# Patient Record
Sex: Male | Born: 1965
Health system: Southern US, Community
[De-identification: ages and names within clinical notes are randomized; demographics above are authoritative.]

## PROBLEM LIST (undated history)

## (undated) ENCOUNTER — Ambulatory Visit: Admission: EM | Source: Home / Self Care

## (undated) DIAGNOSIS — R51 Headache: Secondary | ICD-10-CM

## (undated) DIAGNOSIS — I10 Essential (primary) hypertension: Secondary | ICD-10-CM

## (undated) DIAGNOSIS — K912 Postsurgical malabsorption, not elsewhere classified: Secondary | ICD-10-CM

## (undated) DIAGNOSIS — K219 Gastro-esophageal reflux disease without esophagitis: Secondary | ICD-10-CM

## (undated) DIAGNOSIS — I2510 Atherosclerotic heart disease of native coronary artery without angina pectoris: Secondary | ICD-10-CM

## (undated) DIAGNOSIS — E781 Pure hyperglyceridemia: Secondary | ICD-10-CM

## (undated) DIAGNOSIS — G4733 Obstructive sleep apnea (adult) (pediatric): Secondary | ICD-10-CM

## (undated) DIAGNOSIS — R519 Headache, unspecified: Secondary | ICD-10-CM

## (undated) DIAGNOSIS — N200 Calculus of kidney: Secondary | ICD-10-CM

## (undated) DIAGNOSIS — E668 Other obesity: Secondary | ICD-10-CM

## (undated) DIAGNOSIS — E119 Type 2 diabetes mellitus without complications: Secondary | ICD-10-CM

## (undated) HISTORY — DX: Essential (primary) hypertension: I10

## (undated) HISTORY — PX: TONSILLECTOMY: SUR1361

## (undated) HISTORY — DX: Obstructive sleep apnea (adult) (pediatric): G47.33

## (undated) HISTORY — PX: INNER EAR SURGERY: SHX679

## (undated) HISTORY — DX: Pure hyperglyceridemia: E78.1

## (undated) HISTORY — PX: COLON SURGERY: SHX602

## (undated) HISTORY — DX: Atherosclerotic heart disease of native coronary artery without angina pectoris: I25.10

## (undated) HISTORY — PX: CHOLECYSTECTOMY: SHX55

## (undated) HISTORY — DX: Calculus of kidney: N20.0

## (undated) HISTORY — PX: APPENDECTOMY: SHX54

## (undated) HISTORY — DX: Other obesity: E66.8

---
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
## 1989-01-24 HISTORY — PX: OTHER SURGICAL HISTORY: SHX169

## 1992-01-25 DIAGNOSIS — K90829 Short bowel syndrome, unspecified: Secondary | ICD-10-CM

## 1992-01-25 DIAGNOSIS — K912 Postsurgical malabsorption, not elsewhere classified: Secondary | ICD-10-CM

## 1992-01-25 HISTORY — DX: Postsurgical malabsorption, not elsewhere classified: K91.2

## 1992-01-25 HISTORY — DX: Short bowel syndrome, unspecified: K90.829

## 2008-01-25 HISTORY — PX: HERNIA REPAIR: SHX51

## 2008-01-25 HISTORY — PX: OTHER SURGICAL HISTORY: SHX169

## 2008-02-25 HISTORY — PX: CARDIAC CATHETERIZATION: SHX172

## 2008-02-28 ENCOUNTER — Ambulatory Visit: Payer: Self-pay | Admitting: Cardiovascular Disease

## 2008-02-28 DIAGNOSIS — I2510 Atherosclerotic heart disease of native coronary artery without angina pectoris: Secondary | ICD-10-CM

## 2008-02-28 HISTORY — DX: Atherosclerotic heart disease of native coronary artery without angina pectoris: I25.10

## 2008-03-01 ENCOUNTER — Emergency Department: Payer: Self-pay

## 2008-03-10 ENCOUNTER — Ambulatory Visit: Payer: Self-pay | Admitting: Urology

## 2009-02-27 DIAGNOSIS — I2510 Atherosclerotic heart disease of native coronary artery without angina pectoris: Secondary | ICD-10-CM | POA: Insufficient documentation

## 2009-09-14 DIAGNOSIS — E559 Vitamin D deficiency, unspecified: Secondary | ICD-10-CM | POA: Insufficient documentation

## 2010-04-06 ENCOUNTER — Observation Stay: Payer: Self-pay | Admitting: Internal Medicine

## 2011-05-04 ENCOUNTER — Ambulatory Visit: Payer: Self-pay | Admitting: Family Medicine

## 2011-10-26 LAB — HEMOGLOBIN A1C: Hgb A1c MFr Bld: 5.7 % (ref 4.0–6.0)

## 2013-01-29 ENCOUNTER — Emergency Department: Payer: Self-pay | Admitting: Emergency Medicine

## 2013-01-29 LAB — BASIC METABOLIC PANEL
Anion Gap: 4 — ABNORMAL LOW (ref 7–16)
BUN: 25 mg/dL — ABNORMAL HIGH (ref 7–18)
CALCIUM: 9.2 mg/dL (ref 8.5–10.1)
CO2: 28 mmol/L (ref 21–32)
Chloride: 103 mmol/L (ref 98–107)
Creatinine: 1.01 mg/dL (ref 0.60–1.30)
EGFR (African American): >60
GLUCOSE: 135 mg/dL — AB (ref 65–99)
OSMOLALITY: 277 (ref 275–301)
POTASSIUM: 4.1 mmol/L (ref 3.5–5.1)
Sodium: 135 mmol/L — ABNORMAL LOW (ref 136–145)

## 2013-01-29 LAB — TROPONIN I

## 2013-01-29 LAB — CBC
HCT: 48.2 % (ref 40.0–52.0)
HGB: 16.8 g/dL (ref 13.0–18.0)
MCH: 29.9 pg (ref 26.0–34.0)
MCHC: 35 g/dL (ref 32.0–36.0)
MCV: 85 fL (ref 80–100)
PLATELETS: 234 10*3/uL (ref 150–440)
RBC: 5.64 10*6/uL (ref 4.40–5.90)
RDW: 12.7 % (ref 11.5–14.5)
WBC: 11.3 10*3/uL — ABNORMAL HIGH (ref 3.8–10.6)

## 2013-02-11 LAB — TSH: TSH: 1.26 u[IU]/mL (ref <=5.90)

## 2013-03-25 LAB — PSA: PSA: 0.2

## 2013-10-07 LAB — HEPATIC FUNCTION PANEL
ALK PHOS: 97 U/L (ref 25–125)
ALT: 27 U/L (ref 10–40)
AST: 27 U/L (ref 14–40)
BILIRUBIN, TOTAL: 0.5 mg/dL

## 2013-10-07 LAB — BASIC METABOLIC PANEL
BUN: 14 mg/dL (ref 4–21)
Creatinine: 0.8 mg/dL (ref <=1.3)
Glucose: 80 mg/dL
POTASSIUM: 3.9 mmol/L (ref 3.4–5.3)
SODIUM: 146 mmol/L (ref 137–147)

## 2013-10-07 LAB — CBC AND DIFFERENTIAL
HCT: 47 % (ref 41–53)
HEMOGLOBIN: 17.1 g/dL (ref 13.5–17.5)
Neutrophils Absolute: 60 /uL
Platelets: 190 10*3/uL (ref 150–399)
WBC: 7.5 10^3/mL

## 2013-12-09 LAB — LIPID PANEL
Cholesterol: 216 mg/dL — AB (ref 0–200)
HDL: 22 mg/dL — AB (ref 35–70)
TRIGLYCERIDES: 691 mg/dL — AB (ref 40–160)

## 2014-05-29 DIAGNOSIS — K279 Peptic ulcer, site unspecified, unspecified as acute or chronic, without hemorrhage or perforation: Secondary | ICD-10-CM | POA: Insufficient documentation

## 2014-05-29 DIAGNOSIS — I152 Hypertension secondary to endocrine disorders: Secondary | ICD-10-CM | POA: Insufficient documentation

## 2014-05-29 DIAGNOSIS — N529 Male erectile dysfunction, unspecified: Secondary | ICD-10-CM | POA: Insufficient documentation

## 2014-05-29 DIAGNOSIS — F329 Major depressive disorder, single episode, unspecified: Secondary | ICD-10-CM | POA: Insufficient documentation

## 2014-05-29 DIAGNOSIS — E668 Other obesity: Secondary | ICD-10-CM

## 2014-05-29 DIAGNOSIS — K219 Gastro-esophageal reflux disease without esophagitis: Secondary | ICD-10-CM | POA: Insufficient documentation

## 2014-05-29 DIAGNOSIS — F419 Anxiety disorder, unspecified: Secondary | ICD-10-CM | POA: Insufficient documentation

## 2014-05-29 DIAGNOSIS — R7989 Other specified abnormal findings of blood chemistry: Secondary | ICD-10-CM | POA: Insufficient documentation

## 2014-05-29 DIAGNOSIS — E782 Mixed hyperlipidemia: Secondary | ICD-10-CM | POA: Insufficient documentation

## 2014-05-29 DIAGNOSIS — IMO0002 Reserved for concepts with insufficient information to code with codable children: Secondary | ICD-10-CM | POA: Insufficient documentation

## 2014-05-29 DIAGNOSIS — K429 Umbilical hernia without obstruction or gangrene: Secondary | ICD-10-CM | POA: Insufficient documentation

## 2014-05-29 DIAGNOSIS — E669 Obesity, unspecified: Secondary | ICD-10-CM

## 2014-05-29 DIAGNOSIS — R4589 Other symptoms and signs involving emotional state: Secondary | ICD-10-CM | POA: Insufficient documentation

## 2014-05-29 DIAGNOSIS — R945 Abnormal results of liver function studies: Secondary | ICD-10-CM | POA: Insufficient documentation

## 2014-05-29 DIAGNOSIS — R739 Hyperglycemia, unspecified: Secondary | ICD-10-CM | POA: Insufficient documentation

## 2014-05-29 DIAGNOSIS — G4730 Sleep apnea, unspecified: Secondary | ICD-10-CM | POA: Insufficient documentation

## 2014-05-29 DIAGNOSIS — F32A Depression, unspecified: Secondary | ICD-10-CM | POA: Insufficient documentation

## 2014-05-29 DIAGNOSIS — I10 Essential (primary) hypertension: Secondary | ICD-10-CM | POA: Insufficient documentation

## 2014-05-29 DIAGNOSIS — G43909 Migraine, unspecified, not intractable, without status migrainosus: Secondary | ICD-10-CM | POA: Insufficient documentation

## 2014-05-29 HISTORY — DX: Obesity, unspecified: E66.9

## 2014-05-29 HISTORY — DX: Other obesity: E66.8

## 2014-07-14 ENCOUNTER — Encounter: Payer: Self-pay | Admitting: Family Medicine

## 2014-07-14 ENCOUNTER — Ambulatory Visit (INDEPENDENT_AMBULATORY_CARE_PROVIDER_SITE_OTHER): Payer: 59 | Admitting: Family Medicine

## 2014-07-14 VITALS — BP 128/76 | HR 84 | Temp 98.2°F | Resp 14 | Ht 71.0 in | Wt 264.0 lb

## 2014-07-14 DIAGNOSIS — Z0000 Encounter for general adult medical examination without abnormal findings: Secondary | ICD-10-CM

## 2014-07-14 DIAGNOSIS — Z125 Encounter for screening for malignant neoplasm of prostate: Secondary | ICD-10-CM

## 2014-07-14 DIAGNOSIS — G44209 Tension-type headache, unspecified, not intractable: Secondary | ICD-10-CM | POA: Diagnosis not present

## 2014-07-14 LAB — POCT URINALYSIS DIPSTICK
Bilirubin, UA: NEGATIVE
Glucose, UA: NEGATIVE
KETONES UA: NEGATIVE
Leukocytes, UA: NEGATIVE
Nitrite, UA: NEGATIVE
Spec Grav, UA: 1.025
Urobilinogen, UA: 0.2
pH, UA: 6

## 2014-07-14 LAB — HEMOCCULT GUIAC POC 1CARD (OFFICE): Fecal Occult Blood, POC: NEGATIVE

## 2014-07-14 NOTE — Progress Notes (Signed)
Patient ID: [name], male   DOB: [birth year], 49 y.o.   MRN: [id number] Patient: [facility], Male    DOB: 06-06-1965, 49 y.o.   MRN: [id number] Visit Date: 07/14/2014  Today's Provider: [name], MD   Chief Complaint  Patient presents with  . Annual Exam   Subjective:  [facility] is a 49 y.o. male who presents today for health maintenance and complete physical. He feels fairly well. He reports exercising none. He reports he is sleeping fairly well. The patient's overall attitude is much better and is spared is much improved over previous months and years. This is despite the fact that his grandmother and mother are both having severe health challenges. He is now separated from his wife for 2 years and divorced is pending. He is coping with this much better. He really has no significant complaints today. He states he thinks he has passed 25 kidney stones since about 2009 when he had his first.  Review of Systems  Constitutional: Negative.   HENT: Positive for dental problem, drooling and ear pain.   Eyes: Positive for itching.  Respiratory: Negative.   Cardiovascular: Negative.   Gastrointestinal: Negative.   Endocrine: Negative.   Genitourinary: Positive for hematuria (passed 3 kidney stones this past weekened) and difficulty urinating.  Musculoskeletal: Positive for back pain and neck pain.  Skin: Negative.   Allergic/Immunologic: Negative.   Neurological: Positive for dizziness and headaches.  Hematological: Negative.   Psychiatric/Behavioral: Negative.     History   Social History  . Marital Status: Married    Spouse Name: Collie Siad  . Number of Children: 0  . Years of Education: 14   Occupational History  . [employer]    Social History Main Topics  . Smoking status: Former Smoker -- 1.00 packs/day for 28 years    Types: Cigarettes    Quit date: 12/24/1996  . Smokeless tobacco: Never Used  . Alcohol Use: Yes     Comment: seldom  . Drug Use: No  .  Sexual Activity: Not Currently   Other Topics Concern  . Not on file   Social History Narrative    Patient Active Problem List   Diagnosis Date Noted  . Signs and symptoms involving emotional state 05/29/2014  . Anxiety 05/29/2014  . Adult BMI 30+ 05/29/2014  . Clinical depression 05/29/2014  . Failure of erection 05/29/2014  . Abnormal LFTs 05/29/2014  . Blood glucose elevated 05/29/2014  . GERD (gastroesophageal reflux disease) 05/29/2014  . Combined fat and carbohydrate induced hyperlipemia 05/29/2014  . Hypertension 05/29/2014  . Headache, migraine 05/29/2014  . Extreme obesity 05/29/2014  . Gastroduodenal ulcer 05/29/2014  . Apnea, sleep 05/29/2014  . Umbilical hernia without obstruction and without gangrene 05/29/2014  . Avitaminosis D 09/14/2009  . Arteriosclerosis of coronary artery 02/27/2009    Past Surgical History  Procedure Laterality Date  . Hernia repair    . Cholecystectomy    . Appendectomy    . Tonsillectomy    . Inner ear surgery      His family history includes Hypertension in his mother; Irritable bowel syndrome in his mother; Lumbar disc disease in his mother; Migraines in his mother.    Outpatient Prescriptions Prior to Visit  Medication Sig Dispense Refill  . losartan-hydrochlorothiazide (HYZAAR) 100-25 MG per tablet Take by mouth.    . meloxicam (MOBIC) 15 MG tablet Take by mouth.    . metoprolol succinate (TOPROL-XL) 100 MG 24 hr tablet Take by mouth.    Marland Kitchen  omeprazole (PRILOSEC) 20 MG capsule Take by mouth.    . tadalafil (CIALIS) 20 MG tablet Take by mouth.    Marland Kitchen buPROPion (WELLBUTRIN XL) 150 MG 24 hr tablet Take by mouth.    . hydrochlorothiazide (HYDRODIURIL) 25 MG tablet Take by mouth.    . losartan (COZAAR) 100 MG tablet Take by mouth.     No facility-administered medications prior to visit.    Patient Care Team: [name]., MD as PCP - General (Family Medicine)     Objective:   Vitals:  Filed Vitals:   07/14/14  1414  BP: 128/76  Pulse: 84  Temp: 98.2 F (36.8 C)  Resp: 14  Height: 5\' 11"  (1.803 m)  Weight: 264 lb (119.75 kg)    Physical Exam  Constitutional: He is oriented to person, place, and time. He appears well-developed and well-nourished. No distress.  HENT:  Head: Normocephalic and atraumatic.  Right Ear: External ear normal.  Left Ear: External ear normal.  Nose: Nose normal.  Mouth/Throat: Oropharynx is clear and moist.  Eyes: Conjunctivae and EOM are normal. Pupils are equal, round, and reactive to light.  Neck: Normal range of motion. Neck supple.  Cardiovascular: Normal rate, regular rhythm, normal heart sounds and intact distal pulses.   No murmur heard. Pulmonary/Chest: Effort normal and breath sounds normal. He has no wheezes. He has no rales. He exhibits no tenderness.  Abdominal: Soft. Bowel sounds are normal. He exhibits no distension. There is no tenderness.  Genitourinary: Rectum normal, prostate normal and penis normal. Guaiac negative stool. No penile tenderness.  Musculoskeletal: Normal range of motion. He exhibits no edema or tenderness.  Neurological: He is alert and oriented to person, place, and time.  Skin: Skin is warm and dry. No erythema.  Psychiatric: He has a normal mood and affect. His behavior is normal. Judgment and thought content normal.     Depression Screen PHQ 2/9 Scores 07/14/2014  PHQ - 2 Score 0      Assessment & Plan:  1. Annual physical exam Check labs today. - CBC with Differential/Platelet - COMPLETE METABOLIC PANEL WITH GFR - Lipid Panel With LDL/HDL Ratio - POCT Urinalysis Dipstick - TSH  2. Prostate cancer screening  - POCT Occult Blood Stool - PSA  3. Anxiety Patient has stopped Bupropion. Doing well off the medication. Follow at this time.   4. Recent reccurrence of kidney stones   I have done the exam and reviewed the above chart and it is accurate to the best of my knowledge.

## 2014-07-15 LAB — CBC WITH DIFFERENTIAL/PLATELET
Basophils Absolute: 0 10*3/uL (ref 0.0–0.2)
Basos: 1 %
EOS (ABSOLUTE): 0.1 10*3/uL (ref 0.0–0.4)
Eos: 1 %
HEMATOCRIT: 45 % (ref 37.5–51.0)
Hemoglobin: 16.5 g/dL (ref 12.6–17.7)
Immature Grans (Abs): 0 10*3/uL (ref 0.0–0.1)
Immature Granulocytes: 0 %
LYMPHS ABS: 2.6 10*3/uL (ref 0.7–3.1)
Lymphs: 29 %
MCH: 30.8 pg (ref 26.6–33.0)
MCHC: 36.7 g/dL — AB (ref 31.5–35.7)
MCV: 84 fL (ref 79–97)
Monocytes Absolute: 0.6 10*3/uL (ref 0.1–0.9)
Monocytes: 7 %
Neutrophils Absolute: 5.5 10*3/uL (ref 1.4–7.0)
Neutrophils: 62 %
PLATELETS: 201 10*3/uL (ref 150–379)
RBC: 5.36 x10E6/uL (ref 4.14–5.80)
RDW: 13.5 % (ref 12.3–15.4)
WBC: 8.8 10*3/uL (ref 3.4–10.8)

## 2014-07-15 LAB — CMP14+EGFR
ALBUMIN: 4.4 g/dL (ref 3.5–5.5)
ALK PHOS: 80 IU/L (ref 39–117)
ALT: 68 IU/L — ABNORMAL HIGH (ref 0–44)
AST: 66 IU/L — ABNORMAL HIGH (ref 0–40)
Albumin/Globulin Ratio: 1.4 (ref 1.1–2.5)
BILIRUBIN TOTAL: 0.9 mg/dL (ref 0.0–1.2)
BUN/Creatinine Ratio: 15 (ref 9–20)
BUN: 15 mg/dL (ref 6–24)
CO2: 25 mmol/L (ref 18–29)
CREATININE: 0.98 mg/dL (ref 0.76–1.27)
Calcium: 9.2 mg/dL (ref 8.7–10.2)
Chloride: 98 mmol/L (ref 97–108)
GFR, EST AFRICAN AMERICAN: 105 mL/min/{1.73_m2} (ref >59)
GFR, EST NON AFRICAN AMERICAN: 91 mL/min/{1.73_m2} (ref >59)
GLOBULIN, TOTAL: 3.1 g/dL (ref 1.5–4.5)
GLUCOSE: 104 mg/dL — AB (ref 65–99)
POTASSIUM: 3.9 mmol/L (ref 3.5–5.2)
Sodium: 140 mmol/L (ref 134–144)
Total Protein: 7.5 g/dL (ref 6.0–8.5)

## 2014-07-15 LAB — LIPID PANEL WITH LDL/HDL RATIO
Cholesterol, Total: 186 mg/dL (ref 100–199)
HDL: 23 mg/dL — AB (ref >39)
TRIGLYCERIDES: 517 mg/dL — AB (ref 0–149)

## 2014-07-15 LAB — PSA: Prostate Specific Ag, Serum: 0.3 ng/mL (ref 0.0–4.0)

## 2014-07-15 LAB — TSH: TSH: 1.97 u[IU]/mL (ref 0.450–4.500)

## 2014-07-16 ENCOUNTER — Telehealth: Payer: Self-pay

## 2014-07-16 NOTE — Telephone Encounter (Signed)
Advised  ED 

## 2014-07-16 NOTE — Telephone Encounter (Signed)
-----   Message from [name]., MD sent at 07/16/2014  8:40 AM EDT ----- Labs stable. Lipids still high. Continue diet and exercise for this. Also prediabetic mildly. Also diet and exercise as treatment for this.

## 2015-01-01 ENCOUNTER — Other Ambulatory Visit: Payer: Self-pay | Admitting: Family Medicine

## 2015-01-02 ENCOUNTER — Other Ambulatory Visit: Payer: Self-pay | Admitting: Family Medicine

## 2015-01-07 ENCOUNTER — Ambulatory Visit: Payer: Self-pay | Admitting: Cardiology

## 2015-01-13 ENCOUNTER — Ambulatory Visit (INDEPENDENT_AMBULATORY_CARE_PROVIDER_SITE_OTHER): Payer: Managed Care, Other (non HMO) | Admitting: Family Medicine

## 2015-01-13 VITALS — BP 142/84 | HR 68 | Temp 98.3°F | Resp 16 | Wt 257.0 lb

## 2015-01-13 DIAGNOSIS — R5383 Other fatigue: Secondary | ICD-10-CM | POA: Diagnosis not present

## 2015-01-13 DIAGNOSIS — R739 Hyperglycemia, unspecified: Secondary | ICD-10-CM

## 2015-01-13 DIAGNOSIS — N529 Male erectile dysfunction, unspecified: Secondary | ICD-10-CM

## 2015-01-13 MED ORDER — TADALAFIL 20 MG PO TABS: 10.0000 mg | ORAL_TABLET | Freq: Every day | ORAL | Status: DC | PRN

## 2015-01-13 NOTE — Progress Notes (Signed)
Patient ID: [facility], male   DOB: Aug 04, 1965, 49 y.o.   MRN: [id number]   Jobie Popp  MRN: [id number] DOB: 18-Mar-1965  Subjective:  HPI   1. Hyperglycemia The patient is a 49 year old male who presents for follow up of his elevated glucose.  He was last seen on 07/14/14.  No management changes were made at that time.  It has been quite a long time since he had his A1C checked and is here today to do so.  2. Other fatigue Patient also complains of fatigue that is getting worse.  He states he feels tired and drained all the time.  He states he feels tired even after just getting up.  He does have sleep apnea and states he uses his CPAP faithfully.  The patient would like to have his Testosterone level checked for this and because he states he has met someone new and would like to talk about that.   Patient Active Problem List   Diagnosis Date Noted  . Signs and symptoms involving emotional state 05/29/2014  . Anxiety 05/29/2014  . Adult BMI 30+ 05/29/2014  . Clinical depression 05/29/2014  . Failure of erection 05/29/2014  . Abnormal LFTs 05/29/2014  . Blood glucose elevated 05/29/2014  . GERD (gastroesophageal reflux disease) 05/29/2014  . Combined fat and carbohydrate induced hyperlipemia 05/29/2014  . Hypertension 05/29/2014  . Headache, migraine 05/29/2014  . Extreme obesity ([place]) 05/29/2014  . Gastroduodenal ulcer 05/29/2014  . Apnea, sleep 05/29/2014  . Umbilical hernia without obstruction and without gangrene 05/29/2014  . Avitaminosis D 09/14/2009  . Arteriosclerosis of coronary artery 02/27/2009    No past medical history on file.  Social History   Social History  . Marital Status: Divorced    Spouse Name: Collie Siad  . Number of Children: 0  . Years of Education: 14   Occupational History  . [employer]    Social History Main Topics  . Smoking status: Former Smoker -- 1.00 packs/day for 28 years    Types: Cigarettes    Quit date: 12/24/1996  . Smokeless  tobacco: Never Used  . Alcohol Use: Yes     Comment: seldom  . Drug Use: No  . Sexual Activity: Not Currently   Other Topics Concern  . Not on file   Social History Narrative    Outpatient Prescriptions Prior to Visit  Medication Sig Dispense Refill  . butalbital-acetaminophen-caffeine (FIORICET) 50-325-40 MG per tablet Take 1 tablet by mouth every 6 (six) hours as needed for headache.    . losartan-hydrochlorothiazide (HYZAAR) 100-25 MG tablet TAKE 1 TABLET BY MOUTH DAILY 30 tablet 10  . meloxicam (MOBIC) 15 MG tablet Take by mouth.    . metoprolol succinate (TOPROL-XL) 100 MG 24 hr tablet TAKE 1 TABLET BY MOUTH EVERY DAY 30 tablet 5  . omeprazole (PRILOSEC) 20 MG capsule Take by mouth.    . tadalafil (CIALIS) 20 MG tablet Take by mouth.     No facility-administered medications prior to visit.    Allergies  Allergen Reactions  . Guaifenesin Er     Review of Systems  Constitutional: Positive for chills and malaise/fatigue. Negative for fever and diaphoresis.       Decreased appetite  Eyes: Negative.   Respiratory: Negative for cough, hemoptysis, sputum production, shortness of breath and wheezing.   Cardiovascular: Negative for chest pain, palpitations, orthopnea and leg swelling.  Gastrointestinal: Negative.   Genitourinary: Negative.        Decreased libido  Neurological:  Negative.  Negative for weakness.  Endo/Heme/Allergies: Negative.   Psychiatric/Behavioral: Negative.        Positive for irritability.   Objective:  BP 142/84 mmHg  Pulse 68  Temp(Src) 98.3 F (36.8 C) (Oral)  Resp 16  Wt 257 lb (116.574 kg)  Physical Exam  Constitutional: He is oriented to person, place, and time and well-developed, well-nourished, and in no distress.  HENT:  Head: Normocephalic and atraumatic.  Eyes: Conjunctivae are normal.  Neck: Neck supple.  Cardiovascular: Normal rate, regular rhythm and normal heart sounds.   Pulmonary/Chest: Effort normal and breath sounds  normal.  Abdominal: Soft.  Neurological: He is alert and oriented to person, place, and time. Gait normal.  Skin: Skin is warm and dry.  Psychiatric: Mood, memory, affect and judgment normal.    Assessment and Plan :  Hyperglycemia  Other fatigue Patient wonders if he could have  Hypogonadism.Check testosterone. HTN GERD Mild Obesity HLD I have done the exam and reviewed the above chart and it is accurate to the best of my knowledge.  Miguel Aschoff MD [facility] 01/13/2015 4:07 PM

## 2015-01-15 LAB — TESTOSTERONE: Testosterone: 427 ng/dL (ref 348–1197)

## 2015-01-15 LAB — HEMOGLOBIN A1C
Est. average glucose Bld gHb Est-mCnc: 140 mg/dL
Hgb A1c MFr Bld: 6.5 % — ABNORMAL HIGH (ref 4.8–5.6)

## 2015-01-29 ENCOUNTER — Ambulatory Visit: Payer: Self-pay | Admitting: Cardiology

## 2015-02-25 ENCOUNTER — Encounter (INDEPENDENT_AMBULATORY_CARE_PROVIDER_SITE_OTHER): Payer: Self-pay

## 2015-02-25 ENCOUNTER — Ambulatory Visit (INDEPENDENT_AMBULATORY_CARE_PROVIDER_SITE_OTHER): Payer: Managed Care, Other (non HMO) | Admitting: Cardiology

## 2015-02-25 ENCOUNTER — Encounter: Payer: Self-pay | Admitting: Cardiology

## 2015-02-25 VITALS — BP 116/80 | HR 83 | Ht 69.5 in | Wt 258.5 lb

## 2015-02-25 DIAGNOSIS — E786 Lipoprotein deficiency: Secondary | ICD-10-CM

## 2015-02-25 DIAGNOSIS — E669 Obesity, unspecified: Secondary | ICD-10-CM

## 2015-02-25 DIAGNOSIS — E8881 Metabolic syndrome: Secondary | ICD-10-CM

## 2015-02-25 DIAGNOSIS — I491 Atrial premature depolarization: Secondary | ICD-10-CM

## 2015-02-25 DIAGNOSIS — R000 Tachycardia, unspecified: Secondary | ICD-10-CM

## 2015-02-25 DIAGNOSIS — E781 Pure hyperglyceridemia: Secondary | ICD-10-CM

## 2015-02-25 DIAGNOSIS — Z8249 Family history of ischemic heart disease and other diseases of the circulatory system: Secondary | ICD-10-CM

## 2015-02-25 DIAGNOSIS — I2510 Atherosclerotic heart disease of native coronary artery without angina pectoris: Secondary | ICD-10-CM

## 2015-02-25 MED ORDER — FENOFIBRATE 150 MG PO CAPS: 150.0000 mg | ORAL_CAPSULE | Freq: Every day | ORAL | Status: DC

## 2015-02-25 NOTE — Progress Notes (Signed)
PATIENT: [facility] MRN: [id number] DOB: 1966-01-07 PCP: Robert Durie, MD  Clinic Note: Chief Complaint  Patient presents with  . Other    No complaints Hx of MI family c/o rapid heart beat at bedtime. Meds reviewed verbally with pt.  . Palpitations    HPI: [facility] is a 50 y.o. male with a PMH below who presents today for reestablishing cardiology care. He is self-referred, but requested through his PCP to establish cardiology care. His mother is [facility]. She is a patient of mine with coronary disease. He was a former patient of Dr. Donne [facility] @ [facility] dating back to 2010  Interval History: [facility] presents today really for establishing cardiology care, and does not have any significant presenting symptom. He will not occasional palpitations when he lies down to sleep to go to sleep, but denies any significant chest discomfort associated with it. He has some epigastric distention depending when he eats, but that actually improves with exercise. He has gotten little bit lax with his exercise, and has gained weight. He has not been doing nearly the amount of exercise that he used to and has been not doing his regular job watching his food intake either. He has put on quite a bit overweight in the last year or so. Despite this he denies any admitting cardiovascular symptoms besides palpitations.  Cardiovascular ROS: positive for - irregular heartbeat, palpitations and this sometimes cause discomfort negative for - edema, loss of consciousness, murmur, orthopnea, palpitations, paroxysmal nocturnal dyspnea, rapid heart rate or shortness of breath, syncope/near-syncope, IA/amaurosis fugax - no   Past Medical History  Diagnosis Date  . OSA treated with BiPAP   . Moderate obesity 05/29/2014  . Coronary artery disease, non-occlusive 02/28/2008    [facility] @ [facility]: 40-50% LCx lesion by cath,  EF 60% with normal wall motion.  . High triglycerides      was previously on  treatment, but  he notes that this was stopped  . Essential hypertension   . Kidney stones     Prior Cardiac Evaluation and Past Surgical History: Past Surgical History  Procedure Laterality Date  . Hernia repair    . Cholecystectomy    . Appendectomy    . Tonsillectomy    . Inner ear surgery    . Cardionet holter monitor   January 2010     sinus rhythm. Rare PVCs in setting was. Current PACs , seen in singles and pairs. Max heart rate 127, minimum heart rate 57. Average 84.  [facility] nuclear stress test   January 2010     heart rate increased from 80-158 BPM. No EKG changes.  there is a moderate sized  reversible defect in the inferior and inferior lateral wall as well as anteroseptal. EF 59%.  . Cardiac catheterization  02/2008    [facility] @ [facility]: 40-50% LCx lesion by cath,  EF 60% with normal wall motion.    Allergies  Allergen Reactions  . Guaifenesin Er     Current Outpatient Prescriptions  Medication Sig Dispense Refill  . butalbital-acetaminophen-caffeine (FIORICET) 50-325-40 MG per tablet Take 1 tablet by mouth every 6 (six) hours as needed for headache.    . losartan-hydrochlorothiazide (HYZAAR) 100-25 MG tablet TAKE 1 TABLET BY MOUTH DAILY 30 tablet 10  . meloxicam (MOBIC) 15 MG tablet Take 15 mg by mouth as needed.     . metoprolol succinate (TOPROL-XL) 100 MG 24 hr tablet TAKE 1 TABLET BY MOUTH EVERY DAY  30 tablet 5  . tadalafil (CIALIS) 20 MG tablet Take 0.5 tablets (10 mg total) by mouth daily as needed for erectile dysfunction. 10 tablet 12  .       No current facility-administered medications for this visit.    Social History   Social History Narrative    He is essentially the primary caregiver for his mom -  Who had an MI following back surgery  In the   Spring of 2016.    family history includes Heart attack in his maternal grandfather, maternal uncle, and mother; Hypertension in his mother; Irritable bowel syndrome in his mother; Lumbar disc disease  in his mother; Migraines in his mother.  ROS: A comprehensive Review of Systems - was performed Review of Systems  Constitutional: Negative for fever, chills, weight loss, malaise/fatigue and diaphoresis.  HENT: Positive for hearing loss and nosebleeds.   Eyes: Negative for blurred vision and double vision.  Respiratory: Negative for cough, sputum production and shortness of breath.   Cardiovascular: Positive for leg swelling (occasional mild).       Per history of present illness  Gastrointestinal: Positive for heartburn, abdominal pain (He tends to get gas buildup with a pressure sensation in the epigastric area. -- This actually gets better with cardiovascular exercise.) and constipation (Occasionally, depending on what he eats.). Negative for blood in stool and melena.  Genitourinary: Negative for hematuria.  Neurological: Negative for headaches.  Endo/Heme/Allergies: Does not bruise/bleed easily.  Psychiatric/Behavioral: Negative for depression. The patient is not nervous/anxious and does not have insomnia.   All other systems reviewed and are negative.   PHYSICAL EXAM BP 116/80 mmHg  Pulse 83  Ht 5' 9.5" (1.765 m)  Wt 258 lb 8 oz (117.255 kg)  BMI 37.64 kg/m2 General appearance: alert, cooperative, appears stated age, no distress, moderately obese and Normal mood and affect. Well groomed. Neck: no adenopathy, no carotid bruit, no JVD and supple, symmetrical, trachea midline Lungs: clear to auscultation bilaterally, normal percussion bilaterally and Nonlabored, good air movement Heart: regular rate and rhythm, S1, S2 normal, no murmur, click, rub or gallop, normal apical impulse and Occasional ectopy Abdomen: soft, non-tender; bowel sounds normal; no masses,  no organomegaly Extremities: extremities normal, atraumatic, no cyanosis or edema and Trace bilateral lower extremity edema. Pulses: 2+ and symmetric Skin: Skin color, texture, turgor normal. No rashes or lesions Neurologic:  Alert and oriented X 3, normal strength and tone. Normal symmetric reflexes. Normal coordination and gait Mental status: Alert, oriented, thought content appropriate Cranial nerves: normal   Adult ECG Report  Rate: 83 ;  Rhythm: normal sinus rhythm  QRS Axis: 30 ;  PR Interval: 120 ;  QRS Duration: 102 ; QTc: 462; Voltages: Normal == all normal  Conduction Disturbances: none  Other Abnormalities: none  Narrative Interpretation: Normal EKG  EKG provided by PCP from 02/14/2008 showed sinus rhythm with PACs. Rate 84 bpm.  Recent Labs: He is due for repeat labs soon. Lab Results  Component Value Date   CHOL 186 07/14/2014   HDL 23* 07/14/2014   LDLCALC Comment 07/14/2014   TRIG 517* 07/14/2014   Lab Results  Component Value Date   HGBA1C 6.5* 01/14/2015    ASSESSMENT / PLAN:  Jud is a relatively stable otherwise healthy 50 year old gentleman his main risk factor is family history, obesity, hypertriglyceridemia and relatively sedentary lifestyle at this current juncture. He has known nonocclusive coronary disease from cardiac catheter ablation in 2010. Our goal should be to continue  Preventative  medicine.  Problem List Items Addressed This Visit    Rapid heart beat - Primary     The only time he notes having the "rapid heartbeat spells or when he lies down at night. Doesn't last very long, and it could very well be a short run of PAT or PSVT.  he is currently on a good dose of beta blocker. I discussed maybe taking his beta blocker shortly before bedtime to help.      Relevant Orders   EKG 12-Lead (Completed)   Obesity, Class II, BMI 35-39.9 (Chronic)    The patient understands the need to lose weight with diet and exercise. We have discussed specific strategies for this.  we talked about potential dietary changes especially with hypertriglyceridemia. He needs to cut down his  Starchy foods. Increase exercise - walking, "aerobic" --> is slowly trying to start.         Metabolic syndrome (Chronic)     Based on BMI, low HDL, high triglycerides and hypertension.  I explained to him this is basically risk equivalent for diabetes and coronary disease. Each factor needs to be controlled.      Hypertriglyceridemia without hypercholesterolemia (Chronic)   Relevant Medications   Fenofibrate 150 MG CAPS   Atrial premature contractions (Chronic)     Long-standing, diagnosed by event monitor in 2010. Relatively a symptomatically. Seems to be better controlled on high-dose Toprol.  he does not seem to have signs and symptoms of SVT or PAT.      Relevant Medications   Fenofibrate 150 MG CAPS   Arteriosclerosis of coronary artery (Chronic)     He does have mild to moderate nonobstructive coronary disease in the  Circumflex by catheterization done in 2010. He has not had any active anginal symptoms.    He basically has all the components of metabolic syndrome:  I did stress of the importance of risk factor modification -  Weight loss with diet As, glycemic control, lipid control with improving HDL and lowering triglycerides , blood pressure control.   he is on a beta blocker  And ARB. Not currently taking aspirin because he bleeds easily at work. Not currently giving him a statin because his hypertriglyceridemia first.       Relevant Medications   Fenofibrate 150 MG CAPS   Abnormally low high density lipoprotein (HDL) cholesterol with hypertriglyceridemia (Chronic)     Low HDL, the only way to get this up is to exercise. Would consider Lovaza in the omega-3 fatty acid. Plan unfortunately with triglycerides as high as they are, we need to start treating them first, so we'll start with fenofibrate.  Once the triglycerides have come down appreciably, we can then consider adding a statin.      Relevant Medications   Fenofibrate 150 MG CAPS    Other Visit Diagnoses    Family history of heart attack        Relevant Orders    EKG 12-Lead (Completed)    Lipid  Profile       Meds ordered this encounter  Medications  . Fenofibrate 150 MG CAPS    Sig: Take 1 capsule (150 mg total) by mouth daily after supper.    Dispense:  30 each    Refill:  6    Followup:  6 months    Tena Linebaugh W, M.D., M.S.  [employer] [employer]  [address] [place], [place] [address] [phone] Fax [phone]

## 2015-02-25 NOTE — Patient Instructions (Signed)
Medication Instructions:  Your physician has recommended you make the following change in your medication:  START taking fenofibrate 150mg  daily at dinner.   Labwork: Fasting lipids in 3 months. Nothing to eat or drink after midnight the evening before your labs.   Testing/Procedures: none  Follow-Up: Your physician wants you to follow-up in: six months with Dr. Ellyn Hack. You will receive a reminder letter in the mail two months in advance. If you don't receive a letter, please call our office to schedule the follow-up appointment.   Any Other Special Instructions Will Be Listed Below (If Applicable).     If you need a refill on your cardiac medications before your next appointment, please call your pharmacy.

## 2015-02-26 ENCOUNTER — Encounter: Payer: Self-pay | Admitting: Cardiology

## 2015-02-26 DIAGNOSIS — E786 Lipoprotein deficiency: Secondary | ICD-10-CM

## 2015-02-26 DIAGNOSIS — E782 Mixed hyperlipidemia: Secondary | ICD-10-CM | POA: Insufficient documentation

## 2015-02-26 DIAGNOSIS — R000 Tachycardia, unspecified: Secondary | ICD-10-CM | POA: Insufficient documentation

## 2015-02-26 DIAGNOSIS — I491 Atrial premature depolarization: Secondary | ICD-10-CM | POA: Insufficient documentation

## 2015-02-26 DIAGNOSIS — E781 Pure hyperglyceridemia: Secondary | ICD-10-CM | POA: Insufficient documentation

## 2015-02-26 DIAGNOSIS — E8881 Metabolic syndrome: Secondary | ICD-10-CM | POA: Insufficient documentation

## 2015-02-26 NOTE — Assessment & Plan Note (Signed)
Based on BMI, low HDL, high triglycerides and hypertension.  I explained to him this is basically risk equivalent for diabetes and coronary disease. Each factor needs to be controlled.

## 2015-02-26 NOTE — Assessment & Plan Note (Signed)
He does have mild to moderate nonobstructive coronary disease in the  Circumflex by catheterization done in 2010. He has not had any active anginal symptoms.    He basically has all the components of metabolic syndrome:  I did stress of the importance of risk factor modification -  Weight loss with diet As, glycemic control, lipid control with improving HDL and lowering triglycerides , blood pressure control.   he is on a beta blocker  And ARB. Not currently taking aspirin because he bleeds easily at work. Not currently giving him a statin because his hypertriglyceridemia first.

## 2015-02-26 NOTE — Assessment & Plan Note (Signed)
Long-standing, diagnosed by event monitor in 2010. Relatively a symptomatically. Seems to be better controlled on high-dose Toprol.  he does not seem to have signs and symptoms of SVT or PAT.

## 2015-02-26 NOTE — Assessment & Plan Note (Signed)
The only time he notes having the "rapid heartbeat spells or when he lies down at night. Doesn't last very long, and it could very well be a short run of PAT or PSVT.  he is currently on a good dose of beta blocker. I discussed maybe taking his beta blocker shortly before bedtime to help.

## 2015-02-26 NOTE — Assessment & Plan Note (Signed)
Low HDL, the only way to get this up is to exercise. Would consider Lovaza in the omega-3 fatty acid. Plan unfortunately with triglycerides as high as they are, we need to start treating them first, so we'll start with fenofibrate.  Once the triglycerides have come down appreciably, we can then consider adding a statin.

## 2015-02-26 NOTE — Assessment & Plan Note (Signed)
The patient understands the need to lose weight with diet and exercise. We have discussed specific strategies for this.  we talked about potential dietary changes especially with hypertriglyceridemia. He needs to cut down his  Starchy foods. Increase exercise - walking, "aerobic" --> is slowly trying to start.

## 2015-04-28 ENCOUNTER — Ambulatory Visit (INDEPENDENT_AMBULATORY_CARE_PROVIDER_SITE_OTHER): Payer: Managed Care, Other (non HMO) | Admitting: Family Medicine

## 2015-04-28 ENCOUNTER — Telehealth: Payer: Self-pay | Admitting: Family Medicine

## 2015-04-28 VITALS — BP 124/80 | HR 69 | Temp 98.0°F | Resp 16 | Wt 253.0 lb

## 2015-04-28 DIAGNOSIS — R197 Diarrhea, unspecified: Secondary | ICD-10-CM

## 2015-04-28 MED ORDER — DIPHENOXYLATE-ATROPINE 2.5-0.025 MG PO TABS
2.0000 | ORAL_TABLET | Freq: Three times a day (TID) | ORAL | Status: DC | PRN
Start: 2015-04-28 — End: 2016-10-11

## 2015-04-28 MED ORDER — METRONIDAZOLE 500 MG PO TABS: 500.0000 mg | ORAL_TABLET | Freq: Three times a day (TID) | ORAL | Status: DC

## 2015-04-28 NOTE — Telephone Encounter (Signed)
Pt states he was seen today and is requesting a doctors note to be out of work.  Pt is also asking how long he should be out of work.  Pt would like to pick this note up today if possible.  [id number]

## 2015-04-28 NOTE — Telephone Encounter (Signed)
Please advise work note. 

## 2015-04-28 NOTE — Progress Notes (Signed)
Patient: Robert Franklin Male    DOB: [birth year]   50 y.o.   MRN: [id number] Visit Date: 04/28/2015  Today's Provider: Lelon Huh, MD   Chief Complaint  Patient presents with  . Diarrhea    x 4 days   Subjective:    Diarrhea  This is a new problem. Episode onset: 4 days ago. The problem occurs 5 to 10 times per day. The problem has been gradually worsening. The stool consistency is described as watery. Associated symptoms include abdominal pain (cramps), arthralgias, chills, headaches, myalgias and sweats. Pertinent negatives include no bloating, coughing, fever, increased  flatus or vomiting. Exacerbated by: eating or drinking. Treatments tried: Immodium AD. The treatment provided no relief.  Patient states a few days ago his stool was a dark color.  Patient states he has been out of work since Friday. He has tried eating light foods and reports that he is not able to keep anything on his stomach. Is nauseated, but has had no vomiting. No recent travel or unusual food exposures, but there have been some co-workers with stomach flu.      Allergies  Allergen Reactions  . Guaifenesin Er    Previous Medications   BUTALBITAL-ACETAMINOPHEN-CAFFEINE (FIORICET) 50-325-40 MG PER TABLET    Take 1 tablet by mouth every 6 (six) hours as needed for headache.   FENOFIBRATE 150 MG CAPS    Take 1 capsule (150 mg total) by mouth daily after supper.   LOSARTAN-HYDROCHLOROTHIAZIDE (HYZAAR) 100-25 MG TABLET    TAKE 1 TABLET BY MOUTH DAILY   MELOXICAM (MOBIC) 15 MG TABLET    Take 15 mg by mouth as needed.    METOPROLOL SUCCINATE (TOPROL-XL) 100 MG 24 HR TABLET    TAKE 1 TABLET BY MOUTH EVERY DAY   TADALAFIL (CIALIS) 20 MG TABLET    Take 0.5 tablets (10 mg total) by mouth daily as needed for erectile dysfunction.    Review of Systems  Constitutional: Positive for chills, diaphoresis and fatigue. Negative for fever and appetite change.  HENT: Positive for congestion (sinus congestion), ear pain,  sinus pressure and voice change. Negative for sore throat.        Yellow sputum production  Respiratory: Negative for cough, chest tightness, shortness of breath and wheezing.   Cardiovascular: Negative for chest pain and palpitations.  Gastrointestinal: Positive for nausea, abdominal pain (cramps) and diarrhea. Negative for vomiting, constipation, abdominal distention, bloating and flatus.  Musculoskeletal: Positive for myalgias and arthralgias.  Neurological: Positive for weakness and headaches.  Psychiatric/Behavioral:       Excessive sleepiness    Social History  Substance Use Topics  . Smoking status: Former Smoker -- 1.00 packs/day for 28 years    Types: Cigarettes    Quit date: 12/24/1996  . Smokeless tobacco: Never Used  . Alcohol Use: Yes     Comment: seldom   Objective:   BP 124/80 mmHg  Pulse 69  Temp(Src) 98 F (36.7 C) (Oral)  Resp 16  Wt 253 lb (114.76 kg)  SpO2 98%  Physical Exam  General Appearance:    Alert, cooperative, no distress  Eyes:    PERRL, conjunctiva/corneas clear, EOM's intact       Lungs:     Clear to auscultation bilaterally, respirations unlabored  Heart:    Regular rate and rhythm  Abdomen:   soft, round, nontender or bowel sounds present in all quadrant and are hypoactive. No CVA tenderness  Assessment & Plan:     1. Diarrhea, unspecified type  - Ova and parasite examination - Stool culture - Fecal leukocytes - C difficile Toxins A+B W/Rflx - metroNIDAZOLE (FLAGYL) 500 MG tablet; Take 1 tablet (500 mg total) by mouth 3 (three) times daily.  Dispense: 21 tablet; Refill: 0 - diphenoxylate-atropine (LOMOTIL) 2.5-0.025 MG tablet; Take 2 tablets by mouth 3 (three) times daily as needed for diarrhea or loose stools.  Dispense: 20 tablet; Refill: 0  Advised to start metronidazole after collecting stool sample.       Lelon Huh, MD  [facility]

## 2015-05-05 LAB — STOOL CULTURE: E coli, Shiga toxin Assay: NEGATIVE

## 2015-05-05 LAB — C DIFFICILE TOXINS A+B W/RFLX: C DIFFICILE TOXINS A+B, EIA: NEGATIVE

## 2015-05-05 LAB — C DIFFICILE, CYTOTOXIN B

## 2015-05-05 LAB — FECAL LEUKOCYTES

## 2015-05-05 LAB — OVA AND PARASITE EXAMINATION

## 2015-05-06 ENCOUNTER — Telehealth: Payer: Self-pay

## 2015-05-06 NOTE — Telephone Encounter (Signed)
Patient advised as directed below. 

## 2015-05-06 NOTE — Telephone Encounter (Signed)
-----   Message from Birdie Sons, MD sent at 05/06/2015  7:42 AM EDT ----- Stool tests were all negative. Was most likely viral infection. Symptoms should be resolved by now.

## 2015-05-14 ENCOUNTER — Encounter: Payer: Self-pay | Admitting: Family Medicine

## 2015-05-14 ENCOUNTER — Ambulatory Visit (INDEPENDENT_AMBULATORY_CARE_PROVIDER_SITE_OTHER): Payer: Managed Care, Other (non HMO) | Admitting: Family Medicine

## 2015-05-14 VITALS — BP 122/68 | HR 74 | Temp 98.1°F | Resp 16 | Wt 259.0 lb

## 2015-05-14 DIAGNOSIS — I10 Essential (primary) hypertension: Secondary | ICD-10-CM | POA: Diagnosis not present

## 2015-05-14 DIAGNOSIS — K219 Gastro-esophageal reflux disease without esophagitis: Secondary | ICD-10-CM

## 2015-05-14 DIAGNOSIS — F419 Anxiety disorder, unspecified: Secondary | ICD-10-CM | POA: Diagnosis not present

## 2015-05-14 DIAGNOSIS — R1013 Epigastric pain: Secondary | ICD-10-CM

## 2015-05-14 DIAGNOSIS — R42 Dizziness and giddiness: Secondary | ICD-10-CM | POA: Diagnosis not present

## 2015-05-14 MED ORDER — MECLIZINE HCL 25 MG PO TABS: 25.0000 mg | ORAL_TABLET | Freq: Three times a day (TID) | ORAL | Status: DC | PRN

## 2015-05-14 MED ORDER — OMEPRAZOLE MAGNESIUM 20 MG PO TBEC: 20.0000 mg | DELAYED_RELEASE_TABLET | Freq: Every day | ORAL | Status: AC

## 2015-05-14 NOTE — Progress Notes (Signed)
Patient ID: Robert Franklin, male   DOB: Jan 29, 1965, 50 y.o.   MRN: [id number]    Subjective:  HPI    Hypertension, follow-up:  BP Readings from Last 3 Encounters:  05/14/15 122/68  04/28/15 124/80  02/25/15 116/80    He was last seen for hypertension 3 months ago.  BP at that visit was 124/80. Management since that visit includes none. He reports good compliance with treatment. He is not having side effects.  He is not exercising. He is adherent to low salt diet.   Outside blood pressures are seldom being checked. He is experiencing none.  Patient denies chest pain, chest pressure/discomfort, claudication, dyspnea, exertional chest pressure/discomfort, fatigue, irregular heart beat, lower extremity edema, near-syncope, orthopnea, palpitations, paroxysmal nocturnal dyspnea and syncope.    Wt Readings from Last 3 Encounters:  05/14/15 259 lb (117.482 kg)  04/28/15 253 lb (114.76 kg)  02/25/15 258 lb 8 oz (117.255 kg)   ------------------------------------------------------------------------   Pt is also having nausea and abdominal pain/pressure at the upper center part of his abdomen. He reports that he feels like he is full before he starts eating. This makes him not want to eat and when he does eat it makes his stomach hurt and nauseous. He has been getting up in the middle of the night and vomiting. He saw Dr. Caryn Section for diarrhea about 2 weeks ago and was treated with flagyl and lomotil. He completed the flagyl and the diarrhea is gone. He was told one time in the past that he may have the beginning of an ulcer. He reports that he is under a lot of stress with work and thinks this may be related. He also has been having dizziness that is random episodes and he feels like the room is moving.   Prior to Admission medications   Medication Sig Start Date End Date Taking? Authorizing Provider  butalbital-acetaminophen-caffeine (FIORICET) 50-325-40 MG per tablet Take 1 tablet by mouth  every 6 (six) hours as needed for headache.   Yes Historical Provider, MD  diphenoxylate-atropine (LOMOTIL) 2.5-0.025 MG tablet Take 2 tablets by mouth 3 (three) times daily as needed for diarrhea or loose stools. 04/28/15  Yes Birdie Sons, MD  Fenofibrate 150 MG CAPS Take 1 capsule (150 mg total) by mouth daily after supper. 02/25/15  Yes Leonie Man, MD  losartan-hydrochlorothiazide [facility]) 100-25 MG tablet TAKE 1 TABLET BY MOUTH DAILY 01/02/15  Yes [name]., MD  meloxicam (MOBIC) 15 MG tablet Take 15 mg by mouth as needed.  02/26/14  Yes Historical Provider, MD  metoprolol succinate (TOPROL-XL) 100 MG 24 hr tablet TAKE 1 TABLET BY MOUTH EVERY DAY 01/01/15  Yes [name]., MD  tadalafil (CIALIS) 20 MG tablet Take 0.5 tablets (10 mg total) by mouth daily as needed for erectile dysfunction. 01/13/15  Yes Richard Maceo Pro., MD    Patient Active Problem List   Diagnosis Date Noted  . Hypertriglyceridemia without hypercholesterolemia 02/26/2015  . Abnormally low high density lipoprotein (HDL) cholesterol with hypertriglyceridemia 02/26/2015  . Atrial premature contractions 02/26/2015  . Metabolic syndrome 123XX123  . Rapid heart beat 02/26/2015  . Signs and symptoms involving emotional state 05/29/2014  . Anxiety 05/29/2014  . Adult BMI 30+ 05/29/2014  . Clinical depression 05/29/2014  . Failure of erection 05/29/2014  . Abnormal LFTs 05/29/2014  . Blood glucose elevated 05/29/2014  . GERD (gastroesophageal reflux disease) 05/29/2014  . Combined fat and carbohydrate induced hyperlipemia 05/29/2014  . Essential  hypertension 05/29/2014  . Headache, migraine 05/29/2014  . Obesity, Class II, BMI 35-39.9 05/29/2014  . Gastroduodenal ulcer 05/29/2014  . Apnea, sleep 05/29/2014  . Umbilical hernia without obstruction and without gangrene 05/29/2014  . Avitaminosis D 09/14/2009  . Arteriosclerosis of coronary artery 02/27/2009    Past Medical History  Diagnosis  Date  . OSA treated with BiPAP   . Moderate obesity 05/29/2014  . Coronary artery disease, non-occlusive 02/28/2008    Dr. Humphrey Rolls @ [facility]: 40-50% LCx lesion by cath,  EF 60% with normal wall motion.  . High triglycerides      was previously on treatment, but  he notes that this was stopped  . Essential hypertension   . Kidney stones     Social History   Social History  . Marital Status: Divorced    Spouse Name: Collie Siad  . Number of Children: 0  . Years of Education: 14   Occupational History  . [employer]    Social History Main Topics  . Smoking status: Former Smoker -- 1.00 packs/day for 28 years    Types: Cigarettes    Quit date: 12/24/1996  . Smokeless tobacco: Never Used  . Alcohol Use: Yes     Comment: seldom  . Drug Use: No  . Sexual Activity: Not Currently   Other Topics Concern  . 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    Allergies  Allergen Reactions  . Guaifenesin Er     Review of Systems  Constitutional: Negative.   HENT: Negative.   Eyes: Negative.   Respiratory: Negative.   Cardiovascular: Negative.   Gastrointestinal: Positive for nausea, vomiting, abdominal pain and diarrhea.  Genitourinary: Negative.   Musculoskeletal: Negative.   Skin: Negative.   Neurological: Positive for dizziness.  Endo/Heme/Allergies: Negative.   Psychiatric/Behavioral: Negative.     Immunization History  Administered Date(s) Administered  . Tdap 01/28/2010   Objective:  BP 122/68 mmHg  Pulse 74  Temp(Src) 98.1 F (36.7 C) (Oral)  Resp 16  Wt 259 lb (117.482 kg)  Physical Exam  Constitutional: He is oriented to person, place, and time and well-developed, well-nourished, and in no distress.  HENT:  Head: Normocephalic and atraumatic.  Right Ear: External ear normal.  Left Ear: External ear normal.  Nose: Nose normal.  Eyes: Conjunctivae  are normal.  Mild nystagnus  Neck: Neck supple.  Cardiovascular: Normal rate, regular rhythm, normal heart sounds and intact distal pulses.   Pulmonary/Chest: Effort normal and breath sounds normal.  Abdominal: Soft. Bowel sounds are normal.  Musculoskeletal: Normal range of motion.  Neurological: He is alert and oriented to person, place, and time. He has normal reflexes. Gait normal. GCS score is 15.  Mild bilateral nystagmus noted  Skin: Skin is warm and dry.  Psychiatric: Mood, memory, affect and judgment normal.    Lab Results  Component Value Date   WBC 8.8 07/14/2014   HGB 17.1 10/07/2013   HCT 45.0 07/14/2014   PLT 201 07/14/2014   GLUCOSE 104* 07/14/2014   CHOL 186 07/14/2014   TRIG 517* 07/14/2014   HDL 23* 07/14/2014   LDLCALC Comment 07/14/2014   TSH 1.970 07/14/2014   PSA 0.2 03/25/2013   HGBA1C 6.5* 01/14/2015    CMP     Component Value Date/Time   NA 140 07/14/2014 1507  NA 135* 01/29/2013 0111   K 3.9 07/14/2014 1507   K 4.1 01/29/2013 0111   CL 98 07/14/2014 1507   CL 103 01/29/2013 0111   CO2 25 07/14/2014 1507   CO2 28 01/29/2013 0111   GLUCOSE 104* 07/14/2014 1507   GLUCOSE 135* 01/29/2013 0111   BUN 15 07/14/2014 1507   BUN 25* 01/29/2013 0111   CREATININE 0.98 07/14/2014 1507   CREATININE 0.8 10/07/2013   CREATININE 1.01 01/29/2013 0111   CALCIUM 9.2 07/14/2014 1507   CALCIUM 9.2 01/29/2013 0111   PROT 7.5 07/14/2014 1507   ALBUMIN 4.4 07/14/2014 1507   AST 66* 07/14/2014 1507   ALT 68* 07/14/2014 1507   ALKPHOS 80 07/14/2014 1507   BILITOT 0.9 07/14/2014 1507   GFRNONAA 91 07/14/2014 1507   GFRNONAA >60 01/29/2013 0111   GFRAA 105 07/14/2014 1507   GFRAA >60 01/29/2013 0111    Assessment and Plan :  1. Gastroesophageal reflux disease, esophagitis presence not specified Possible but less likely PUD. Will refer to GI if patient continues to be symptomatic. - omeprazole (PRILOSEC OTC) 20 MG tablet; Take 1 tablet (20 mg total) by  mouth daily.  Dispense: 30 tablet; Refill: 12  2. Epigastric pain   3. Vertigo  - meclizine (ANTIVERT) 25 MG tablet; Take 1 tablet (25 mg total) by mouth 3 (three) times daily as needed for dizziness.  Dispense: 30 tablet; Refill: 0  4. Essential hypertension   5. Anxiety 6. Possible mild allergic rhinitis I have done the exam and reviewed the above chart and it is accurate to the best of my knowledge.  Patient was seen and examined by Dr. Miguel Aschoff, and noted scribed by Webb Laws, [place] MD [facility] 05/14/2015 4:46 PM

## 2015-05-27 ENCOUNTER — Other Ambulatory Visit (INDEPENDENT_AMBULATORY_CARE_PROVIDER_SITE_OTHER): Payer: Managed Care, Other (non HMO) | Admitting: *Deleted

## 2015-05-27 DIAGNOSIS — Z8249 Family history of ischemic heart disease and other diseases of the circulatory system: Secondary | ICD-10-CM | POA: Diagnosis not present

## 2015-05-27 NOTE — Addendum Note (Signed)
Addended by: Eulis Foster on: 05/27/2015 07:57 AM   Modules accepted: Orders

## 2015-05-29 LAB — LIPID PANEL
CHOL/HDL RATIO: 7.7 ratio — AB (ref 0.0–5.0)
Cholesterol, Total: 184 mg/dL (ref 100–199)
HDL: 24 mg/dL — AB (ref >39)
Triglycerides: 478 mg/dL — ABNORMAL HIGH (ref 0–149)

## 2015-06-04 ENCOUNTER — Other Ambulatory Visit: Payer: Self-pay

## 2015-06-15 ENCOUNTER — Other Ambulatory Visit: Payer: Self-pay

## 2015-06-15 DIAGNOSIS — E785 Hyperlipidemia, unspecified: Secondary | ICD-10-CM

## 2015-06-15 MED ORDER — OMEGA-3-ACID ETHYL ESTERS 1 G PO CAPS: 1.0000 g | ORAL_CAPSULE | Freq: Every day | ORAL | Status: DC

## 2015-06-25 ENCOUNTER — Other Ambulatory Visit: Payer: Self-pay | Admitting: Family Medicine

## 2015-07-10 ENCOUNTER — Encounter: Payer: Self-pay | Admitting: Family Medicine

## 2015-07-10 ENCOUNTER — Ambulatory Visit (INDEPENDENT_AMBULATORY_CARE_PROVIDER_SITE_OTHER): Payer: Managed Care, Other (non HMO) | Admitting: Family Medicine

## 2015-07-10 VITALS — BP 122/88 | HR 64 | Temp 97.8°F | Resp 16 | Wt 256.0 lb

## 2015-07-10 DIAGNOSIS — R252 Cramp and spasm: Secondary | ICD-10-CM | POA: Diagnosis not present

## 2015-07-10 MED ORDER — CYCLOBENZAPRINE HCL 5 MG PO TABS: 5.0000 mg | ORAL_TABLET | Freq: Three times a day (TID) | ORAL | Status: DC | PRN

## 2015-07-10 NOTE — Progress Notes (Signed)
Subjective:     Patient ID: Robert Franklin, male   DOB: 04/25/1965, 50 y.o.   MRN: [id number]  HPI  Chief Complaint  Patient presents with  . 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 He is continually exposed to a hot environment. Reports drinking 60-70 oz of water and 24 oz of soft drinks during a typical shift. Reports toes and fingers cramping and curling. Reports back and upper arm spasms and calf cramps.States toes have been bothering him for 3 months.   Review of Systems     Objective:   Physical Exam  Constitutional: He appears well-developed and well-nourished. No distress.  Cardiovascular: Normal rate and regular rhythm.   Pulmonary/Chest: Breath sounds normal.  Musculoskeletal: He exhibits no edema (of lower extremities).       Assessment:    1. Muscle cramps - Renal function panel - Magnesium - cyclobenzaprine (FLEXERIL) 5 MG tablet; Take 1 tablet (5 mg total) by mouth 3 (three) times daily as needed for muscle spasms.  Dispense: 30 tablet; Refill: 0    Plan:    Add Gatorade to his regimen pending lab work. Suspect environmental injury. Consider parathyroid abnormality.

## 2015-07-10 NOTE — Patient Instructions (Signed)
Switch to Gatorade for 1/2 of your fluid intake. We will call you with the lab results.

## 2015-07-11 LAB — RENAL FUNCTION PANEL
Albumin: 4.6 g/dL (ref 3.5–5.5)
BUN/Creatinine Ratio: 18 (ref 9–20)
BUN: 19 mg/dL (ref 6–24)
CALCIUM: 9.8 mg/dL (ref 8.7–10.2)
CO2: 24 mmol/L (ref 18–29)
CREATININE: 1.03 mg/dL (ref 0.76–1.27)
Chloride: 97 mmol/L (ref 96–106)
GFR calc Af Amer: 98 mL/min/{1.73_m2} (ref >59)
GFR calc non Af Amer: 85 mL/min/{1.73_m2} (ref >59)
Glucose: 137 mg/dL — ABNORMAL HIGH (ref 65–99)
PHOSPHORUS: 3.9 mg/dL (ref 2.5–4.5)
Potassium: 4.2 mmol/L (ref 3.5–5.2)
Sodium: 139 mmol/L (ref 134–144)

## 2015-07-11 LAB — MAGNESIUM: MAGNESIUM: 2 mg/dL (ref 1.6–2.3)

## 2015-07-13 ENCOUNTER — Telehealth: Payer: Self-pay

## 2015-07-13 NOTE — Telephone Encounter (Signed)
Pt informed of results. He reports that he is feeling better since drinking Gatorade.

## 2015-07-13 NOTE — Telephone Encounter (Signed)
Was talking with patient but some how we lost communication.  Thanks,  -Nayla Dias

## 2015-07-13 NOTE — Telephone Encounter (Signed)
-----   Message from Carmon Ginsberg, [place] sent at 07/13/2015  7:37 AM EDT ----- Labs were ok. Is he feeling better with intake of Gatorade?

## 2015-09-15 ENCOUNTER — Encounter: Payer: Self-pay | Admitting: Family Medicine

## 2015-09-15 ENCOUNTER — Ambulatory Visit (INDEPENDENT_AMBULATORY_CARE_PROVIDER_SITE_OTHER): Payer: Managed Care, Other (non HMO) | Admitting: Family Medicine

## 2015-09-15 VITALS — BP 132/84 | HR 76 | Temp 97.5°F | Resp 16 | Wt 254.0 lb

## 2015-09-15 DIAGNOSIS — E668 Other obesity: Secondary | ICD-10-CM | POA: Diagnosis not present

## 2015-09-15 DIAGNOSIS — I10 Essential (primary) hypertension: Secondary | ICD-10-CM | POA: Diagnosis not present

## 2015-09-15 DIAGNOSIS — IMO0002 Reserved for concepts with insufficient information to code with codable children: Secondary | ICD-10-CM

## 2015-09-15 DIAGNOSIS — K219 Gastro-esophageal reflux disease without esophagitis: Secondary | ICD-10-CM

## 2015-09-15 DIAGNOSIS — F419 Anxiety disorder, unspecified: Secondary | ICD-10-CM | POA: Diagnosis not present

## 2015-09-15 DIAGNOSIS — R739 Hyperglycemia, unspecified: Secondary | ICD-10-CM | POA: Diagnosis not present

## 2015-09-15 DIAGNOSIS — E781 Pure hyperglyceridemia: Secondary | ICD-10-CM

## 2015-09-15 NOTE — Progress Notes (Signed)
Subjective:  HPI Pt was originally scheduled for a CPE. He did not want to have a CPE because he has had 3 hours of sleep over the past 2 days and did not feel like having it done. He has been tired and irritable, as he is having to work a lot more because his boss no longer works with him and they are trying to give him the position.  However he would like to discuss why he is on 3 different BP medications and if he needs any vaccines to go to [place] in Nov/Dec. He is going to visit a friend.    Prior to Admission medications   Medication Sig Start Date End Date Taking? Authorizing Provider  butalbital-acetaminophen-caffeine (FIORICET) 50-325-40 MG per tablet Take 1 tablet by mouth every 6 (six) hours as needed for headache.    Historical Provider, MD  cyclobenzaprine (FLEXERIL) 5 MG tablet Take 1 tablet (5 mg total) by mouth 3 (three) times daily as needed for muscle spasms. 07/10/15   Carmon Ginsberg, PA  diphenoxylate-atropine (LOMOTIL) 2.5-0.025 MG tablet Take 2 tablets by mouth 3 (three) times daily as needed for diarrhea or loose stools. 04/28/15   Birdie Sons, MD  fenofibrate (TRICOR) 145 MG tablet  09/02/15   Historical Provider, MD  Fenofibrate 150 MG CAPS Take 1 capsule (150 mg total) by mouth daily after supper. 02/25/15   Leonie Man, MD  losartan-hydrochlorothiazide [facility]) 100-25 MG tablet TAKE 1 TABLET BY MOUTH DAILY 01/02/15   [name]., MD  meclizine (ANTIVERT) 25 MG tablet Take 1 tablet (25 mg total) by mouth 3 (three) times daily as needed for dizziness. 05/14/15   Richard Maceo Pro., MD  meloxicam (MOBIC) 15 MG tablet Take 15 mg by mouth as needed.  02/26/14   Historical Provider, MD  metoprolol succinate (TOPROL-XL) 100 MG 24 hr tablet TAKE 1 TABLET BY MOUTH EVERY DAY 06/25/15   [name]., MD  omega-3 acid ethyl esters (LOVAZA) 1 g capsule Take 1 capsule (1 g total) by mouth daily. 06/15/15   Leonie Man, MD  omeprazole (PRILOSEC OTC) 20 MG  tablet Take 1 tablet (20 mg total) by mouth daily. 05/14/15   Richard Maceo Pro., MD  tadalafil (CIALIS) 20 MG tablet Take 0.5 tablets (10 mg total) by mouth daily as needed for erectile dysfunction. 01/13/15   [name]., MD    Patient Active Problem List   Diagnosis Date Noted  . Hypertriglyceridemia without hypercholesterolemia 02/26/2015  . Abnormally low high density lipoprotein (HDL) cholesterol with hypertriglyceridemia 02/26/2015  . Atrial premature contractions 02/26/2015  . Metabolic syndrome 123XX123  . Rapid heart beat 02/26/2015  . Signs and symptoms involving emotional state 05/29/2014  . Anxiety 05/29/2014  . Adult BMI 30+ 05/29/2014  . Clinical depression 05/29/2014  . Failure of erection 05/29/2014  . Abnormal LFTs 05/29/2014  . Blood glucose elevated 05/29/2014  . GERD (gastroesophageal reflux disease) 05/29/2014  . Combined fat and carbohydrate induced hyperlipemia 05/29/2014  . Essential hypertension 05/29/2014  . Headache, migraine 05/29/2014  . Obesity, Class II, BMI 35-39.9 05/29/2014  . Gastroduodenal ulcer 05/29/2014  . Apnea, sleep 05/29/2014  . Umbilical hernia without obstruction and without gangrene 05/29/2014  . Avitaminosis D 09/14/2009  . Arteriosclerosis of coronary artery 02/27/2009    Past Medical History:  Diagnosis Date  . Coronary artery disease, non-occlusive 02/28/2008   Dr. Humphrey Rolls @ [place]: 40-50% LCx lesion by cath,  EF  60% with normal wall motion.  . Essential hypertension   . High triglycerides     was previously on treatment, but  he notes that this was stopped  . Kidney stones   . Moderate obesity 05/29/2014  . OSA treated with BiPAP     Social History   Social History  . Marital status: Divorced    Spouse name: Collie Siad  . Number of children: 0  . Years of education: 14   Occupational History  . [employer]    Social History Main Topics  . Smoking status: Former Smoker    Packs/day: 1.00    Years: 28.00     Types: Cigarettes    Quit date: 12/24/1996  . Smokeless tobacco: Never Used  . Alcohol use Yes     Comment: seldom  . Drug use: No  . Sexual activity: Not Currently   Other Topics Concern  . 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    Allergies  Allergen Reactions  . Guaifenesin Er     Review of Systems  Constitutional: Positive for malaise/fatigue.  HENT: Negative.   Eyes: Negative.   Respiratory: Negative.   Cardiovascular: Negative.   Gastrointestinal: Positive for abdominal pain.  Genitourinary: Negative.   Musculoskeletal: Negative.   Skin: Negative.   Neurological: Negative.   Endo/Heme/Allergies: Negative.   Psychiatric/Behavioral: Negative.     Immunization History  Administered Date(s) Administered  . Tdap 01/28/2010   Objective:  BP 132/84 (BP Location: Left Arm, Patient Position: Sitting, Cuff Size: Large)   Pulse 76   Temp 97.5 F (36.4 C) (Oral)   Resp 16   Wt 254 lb (115.2 kg)   BMI 36.97 kg/m   Physical Exam  Constitutional: He is oriented to person, place, and time and well-developed, well-nourished, and in no distress.  Eyes: Conjunctivae and EOM are normal. Pupils are equal, round, and reactive to light.  Neck: Normal range of motion. Neck supple.  Cardiovascular: Normal rate, regular rhythm, normal heart sounds and intact distal pulses.   Pulmonary/Chest: Effort normal and breath sounds normal.  Abdominal: Soft.  Musculoskeletal: Normal range of motion. He exhibits no edema.  Neurological: He is alert and oriented to person, place, and time. He has normal reflexes. Gait normal. GCS score is 15.  Skin: Skin is warm and dry.  Psychiatric: Mood, memory, affect and judgment normal.    Lab Results  Component Value Date   WBC 8.8 07/14/2014   HGB 17.1 10/07/2013   HCT 45.0 07/14/2014   PLT 201 07/14/2014   GLUCOSE 137  (H) 07/10/2015   CHOL 184 05/27/2015   TRIG 478 (H) 05/27/2015   HDL 24 (L) 05/27/2015   LDLCALC Comment 05/27/2015   TSH 1.970 07/14/2014   PSA 0.2 03/25/2013   HGBA1C 6.5 (H) 01/14/2015    CMP     Component Value Date/Time   NA 139 07/10/2015 1220   NA 135 (L) 01/29/2013 0111   K 4.2 07/10/2015 1220   K 4.1 01/29/2013 0111   CL 97 07/10/2015 1220   CL 103 01/29/2013 0111   CO2 24 07/10/2015 1220   CO2 28 01/29/2013 0111   GLUCOSE 137 (H) 07/10/2015 1220   GLUCOSE 135 (H) 01/29/2013 0111   BUN 19 07/10/2015 1220   BUN 25 (H) 01/29/2013 0111   CREATININE  1.03 07/10/2015 1220   CREATININE 1.01 01/29/2013 0111   CALCIUM 9.8 07/10/2015 1220   CALCIUM 9.2 01/29/2013 0111   PROT 7.5 07/14/2014 1507   ALBUMIN 4.6 07/10/2015 1220   AST 66 (H) 07/14/2014 1507   ALT 68 (H) 07/14/2014 1507   ALKPHOS 80 07/14/2014 1507   BILITOT 0.9 07/14/2014 1507   GFRNONAA 85 07/10/2015 1220   GFRNONAA >60 01/29/2013 0111   GFRAA 98 07/10/2015 1220   GFRAA >60 01/29/2013 0111    Assessment and Plan :  1. Essential hypertension   2. Hyperglycemia   3. Gastroesophageal reflux disease, esophagitis presence not specified   4. Adult BMI 30+ Diet and exercise discussed at some length.  5. Anxiety   6. Hypertriglyceridemia without hypercholesterolemia    Patient was seen and examined by Dr. Miguel Aschoff, and noted scribed by Webb Laws, CMA I have done the exam and reviewed the above chart and it is accurate to the best of my knowledge.  Miguel Aschoff MD [facility] 09/15/2015 2:15 PM

## 2015-09-30 ENCOUNTER — Other Ambulatory Visit: Payer: Self-pay

## 2015-09-30 ENCOUNTER — Other Ambulatory Visit: Payer: Self-pay | Admitting: Cardiology

## 2015-09-30 MED ORDER — FENOFIBRATE 150 MG PO CAPS: 150.0000 mg | ORAL_CAPSULE | Freq: Every day | ORAL | 1 refills | Status: DC

## 2015-11-27 ENCOUNTER — Other Ambulatory Visit: Payer: Self-pay | Admitting: Cardiology

## 2015-11-27 ENCOUNTER — Other Ambulatory Visit: Payer: Self-pay | Admitting: Family Medicine

## 2015-12-10 ENCOUNTER — Telehealth: Payer: Self-pay | Admitting: *Deleted

## 2015-12-10 DIAGNOSIS — Z79899 Other long term (current) drug therapy: Secondary | ICD-10-CM

## 2015-12-10 DIAGNOSIS — I2510 Atherosclerotic heart disease of native coronary artery without angina pectoris: Secondary | ICD-10-CM

## 2015-12-10 DIAGNOSIS — E786 Lipoprotein deficiency: Secondary | ICD-10-CM

## 2015-12-10 DIAGNOSIS — E781 Pure hyperglyceridemia: Secondary | ICD-10-CM

## 2015-12-10 MED ORDER — ICOSAPENT ETHYL 1 G PO CAPS: 2.0000 | ORAL_CAPSULE | Freq: Two times a day (BID) | ORAL | Status: DC

## 2015-12-10 NOTE — Telephone Encounter (Signed)
RECEIVED COVER MYMED  PRIOR AUTHORIZATION FOLLOW UP FORM  LOVAZA - OMEGA 3- ACID ETHY ESTERS   PATIENT NEEDED TO TRY FENOFIBRATE  , AND IF RECEIVED PRIOR AUTHORIZATION ONLY CAN REFILL 31 DAYS AT A TIME   CONTACTED PHARMACY TO SWITCH TO VASCEPA 2 CAPSULE  BID -  DECLINE  NEEDS PRIOR AUTH.BECAUSE  PATIENT IS TAKING FENOFIBRATE.   WILL FOLLOW UP WITH COVER MY MEDS NEXT WEEK

## 2015-12-24 ENCOUNTER — Encounter: Payer: Self-pay | Admitting: Family Medicine

## 2015-12-24 ENCOUNTER — Ambulatory Visit (INDEPENDENT_AMBULATORY_CARE_PROVIDER_SITE_OTHER): Payer: 59 | Admitting: Family Medicine

## 2015-12-24 VITALS — BP 126/88 | HR 60 | Temp 97.9°F | Resp 16 | Wt 252.0 lb

## 2015-12-24 DIAGNOSIS — H60391 Other infective otitis externa, right ear: Secondary | ICD-10-CM

## 2015-12-24 DIAGNOSIS — G43809 Other migraine, not intractable, without status migrainosus: Secondary | ICD-10-CM | POA: Diagnosis not present

## 2015-12-24 MED ORDER — BUTALBITAL-APAP-CAFFEINE 50-325-40 MG PO TABS: 1.0000 | ORAL_TABLET | Freq: Four times a day (QID) | ORAL | 1 refills | Status: DC | PRN

## 2015-12-24 MED ORDER — NEOMYCIN-POLYMYXIN-HC 3.5-10000-1 OT SOLN: 4.0000 [drp] | Freq: Four times a day (QID) | OTIC | 0 refills | Status: DC

## 2015-12-24 NOTE — Patient Instructions (Signed)
Let me know if not improving. 

## 2015-12-24 NOTE — Progress Notes (Signed)
Subjective:     Patient ID: Robert Franklin, male   DOB: 06-Oct-1965, 50 y.o.   MRN: [id number]  HPI  Chief Complaint  Patient presents with  . Ear Pain    right ear pain started X 1 week, worsening since yesterday. No treatmtnt tried. Patient denies any other symptoms.   States he does manipulate his ear canal cleaning it out periodically. No change in hearing. Also wishes refill on Fioricet for migraine headaches.   Review of Systems     Objective:   Physical Exam  Constitutional: He appears well-developed and well-nourished. No distress.  HENT:  Right Ear: There is tenderness (in ear canal with inflammation). No drainage.  Left Ear: Tympanic membrane and ear canal normal.       Assessment:    1. Other infective acute otitis externa of right ear - neomycin-polymyxin-hydrocortisone (CORTISPORIN) otic solution; Place 4 drops into the right ear 4 (four) times daily.  Dispense: 10 mL; Refill: 0  2. Other migraine without status migrainosus, not intractable: refilled per Dr. Rosanna Randy - butalbital-acetaminophen-caffeine (FIORICET) 50-325-40 MG tablet; Take 1 tablet by mouth every 6 (six) hours as needed for headache or migraine.  Dispense: 55 tablet; Refill: 1    Plan:    Further f/u if not improving.

## 2016-01-01 DIAGNOSIS — Z79899 Other long term (current) drug therapy: Secondary | ICD-10-CM | POA: Insufficient documentation

## 2016-01-01 NOTE — Telephone Encounter (Signed)
Spoke to patient, Attempting to do prior authorization  information is need concerning patient lab result sinc starting lovaza.  no labs since 05/27/15. Order labs - cmp , lipid  To see the status of trigylcerides Patient verbalized  Understanding-- will go to lab next week-[facility]

## 2016-01-22 ENCOUNTER — Telehealth: Payer: Self-pay

## 2016-01-22 ENCOUNTER — Ambulatory Visit (INDEPENDENT_AMBULATORY_CARE_PROVIDER_SITE_OTHER): Payer: 59 | Admitting: Physician Assistant

## 2016-01-22 ENCOUNTER — Encounter: Payer: Self-pay | Admitting: Physician Assistant

## 2016-01-22 VITALS — BP 114/68 | HR 64 | Temp 97.7°F | Resp 16 | Wt 251.0 lb

## 2016-01-22 DIAGNOSIS — R52 Pain, unspecified: Secondary | ICD-10-CM | POA: Diagnosis not present

## 2016-01-22 DIAGNOSIS — R197 Diarrhea, unspecified: Secondary | ICD-10-CM | POA: Diagnosis not present

## 2016-01-22 DIAGNOSIS — R110 Nausea: Secondary | ICD-10-CM

## 2016-01-22 LAB — POC INFLUENZA A&B (BINAX/QUICKVUE): Influenza A, POC: NEGATIVE

## 2016-01-22 MED ORDER — PROMETHAZINE HCL 12.5 MG PO TABS: 12.5000 mg | ORAL_TABLET | Freq: Four times a day (QID) | ORAL | 0 refills | Status: DC | PRN

## 2016-01-22 MED ORDER — CIPROFLOXACIN HCL 500 MG PO TABS: 500.0000 mg | ORAL_TABLET | Freq: Two times a day (BID) | ORAL | 0 refills | Status: DC

## 2016-01-22 NOTE — Progress Notes (Signed)
Patient: Robert Franklin Male    DOB: 03-17-1965   50 y.o.   MRN: [id number] Visit Date: 01/22/2016  Today's Provider: Trinna Post, PA-C   Chief Complaint  Patient presents with  . URI  . Nausea   Subjective:    HPI Pt is here today for nausea, head and chest congestion. He reports that on December 27 th he was waiting to fly back here from the [place] and started having a sore throat. It has progressed from there. He reports that he started having nausea, cough, dizziness, loose stools, fatigue, body aches, chills and yellow sputum.  He was in the [place] for four days. Today he had an episode of watery, non-bloody diarrhea. No vomiting.    Allergies  Allergen Reactions  . Guaifenesin Er      Current Outpatient Prescriptions:  .  butalbital-acetaminophen-caffeine (FIORICET) 50-325-40 MG tablet, Take 1 tablet by mouth every 6 (six) hours as needed for headache or migraine., Disp: 55 tablet, Rfl: 1 .  cyclobenzaprine (FLEXERIL) 5 MG tablet, Take 1 tablet (5 mg total) by mouth 3 (three) times daily as needed for muscle spasms., Disp: 30 tablet, Rfl: 0 .  diphenoxylate-atropine (LOMOTIL) 2.5-0.025 MG tablet, Take 2 tablets by mouth 3 (three) times daily as needed for diarrhea or loose stools., Disp: 20 tablet, Rfl: 0 .  fenofibrate (TRICOR) 145 MG tablet, TAKE 1 TABLET BY MOUTH DAILY AFTER SUPPER. 150MG  NO LONGER ON THE MARKET, Disp: 30 tablet, Rfl: 8 .  Icosapent Ethyl (VASCEPA) 1 g CAPS, Take 2 capsules by mouth 2 (two) times daily., Disp: 120 capsule, Rfl: O .  losartan-hydrochlorothiazide (HYZAAR) 100-25 MG tablet, TAKE 1 TABLET BY MOUTH DAILY, Disp: 90 tablet, Rfl: 2 .  meclizine (ANTIVERT) 25 MG tablet, Take 1 tablet (25 mg total) by mouth 3 (three) times daily as needed for dizziness., Disp: 30 tablet, Rfl: 0 .  meloxicam (MOBIC) 15 MG tablet, Take 15 mg by mouth as needed. , Disp: , Rfl:  .  metoprolol succinate (TOPROL-XL) 100 MG 24 hr tablet, TAKE 1 TABLET  BY MOUTH EVERY DAY, Disp: 30 tablet, Rfl: 12 .  neomycin-polymyxin-hydrocortisone (CORTISPORIN) otic solution, Place 4 drops into the right ear 4 (four) times daily., Disp: 10 mL, Rfl: 0 .  omega-3 acid ethyl esters (LOVAZA) 1 g capsule, Take 1 capsule (1 g total) by mouth daily., Disp: 30 capsule, Rfl: 6 .  omeprazole (PRILOSEC OTC) 20 MG tablet, Take 1 tablet (20 mg total) by mouth daily., Disp: 30 tablet, Rfl: 12 .  tadalafil (CIALIS) 20 MG tablet, Take 0.5 tablets (10 mg total) by mouth daily as needed for erectile dysfunction., Disp: 10 tablet, Rfl: 12  Review of Systems  Constitutional: Positive for chills and fatigue.  HENT: Positive for congestion and sore throat.   Eyes: Negative.   Respiratory: Positive for cough.   Cardiovascular: Negative.   Gastrointestinal: Positive for diarrhea (loose stools) and nausea.  Endocrine: Negative.   Genitourinary: Negative.   Musculoskeletal: Positive for myalgias.  Skin: Negative.   Allergic/Immunologic: Negative.   Neurological: Positive for dizziness.  Hematological: Negative.   Psychiatric/Behavioral: Negative.     Social History  Substance Use Topics  . Smoking status: Former Smoker    Packs/day: 1.00    Years: 28.00    Types: Cigarettes    Quit date: 12/24/1996  . Smokeless tobacco: Never Used  . Alcohol use Yes     Comment: seldom   Objective:   BP 114/68 (  BP Location: Right Arm, Patient Position: Sitting, Cuff Size: Large)   Pulse 64   Temp 97.7 F (36.5 C) (Oral)   Resp 16   Wt 251 lb (113.9 kg)   SpO2 98%   BMI 36.53 kg/m   Physical Exam  Constitutional: He is oriented to person, place, and time. He appears well-developed and well-nourished.  HENT:  Mouth/Throat: Mucous membranes are normal. Posterior oropharyngeal erythema present. No oropharyngeal exudate or posterior oropharyngeal edema.  Neck: Neck supple.  Cardiovascular: Normal rate and regular rhythm.   Pulmonary/Chest: Effort normal and breath sounds  normal. No respiratory distress.  Abdominal: Soft. Bowel sounds are normal. He exhibits no distension and no mass. There is no tenderness. There is no rebound and no guarding.  Lymphadenopathy:    He has no cervical adenopathy.  Neurological: He is alert and oriented to person, place, and time.  Skin: Skin is warm and dry.  Psychiatric: He has a normal mood and affect. His behavior is normal.        Assessment & Plan:     1. Body aches  Rapid flu in office was negative. Suspect viral syndrome but will cover for traveler's diarrhea below.  - POC Influenza A&B(BINAX/QUICKVUE)  2. Diarrhea, unspecified type  Patient's diarrhea is non-bloody but watery. Will cover with cipro. Cautioned patient on return precautions for which he needs to be seen like bloody diarrhea, intractable vomiting, and fever.  - ciprofloxacin (CIPRO) 500 MG tablet; Take 1 tablet (500 mg total) by mouth 2 (two) times daily.  Dispense: 6 tablet; Refill: 0  3. Nausea  May use as needed.  - promethazine (PHENERGAN) 12.5 MG tablet; Take 1 tablet (12.5 mg total) by mouth every 6 (six) hours as needed for nausea or vomiting.  Dispense: 30 tablet; Refill: 0  Return if symptoms worsen or fail to improve.   Patient Instructions  Diarrhea, Adult Introduction Diarrhea is when you have loose and water poop (stool) often. Diarrhea can make you feel weak and cause you to get dehydrated. Dehydration can make you tired and thirsty, make you have a dry mouth, and make it so you pee (urinate) less often. Diarrhea often lasts 2-3 days. However, it can last longer if it is a sign of something more serious. It is important to treat your diarrhea as told by your doctor. Follow these instructions at home: Eating and drinking Follow these recommendations as told by your doctor:  Take an oral rehydration solution (ORS). This is a drink that is sold at pharmacies and stores.  Drink clear fluids, such as:  Water.  Ice  chips.  Diluted fruit juice.  Low-calorie sports drinks.  Eat bland, easy-to-digest foods in small amounts as you are able. These foods include:  Bananas.  Applesauce.  Rice.  Low-fat (lean) meats.  Toast.  Crackers.  Avoid drinking fluids that have a lot of sugar or caffeine in them.  Avoid alcohol.  Avoid spicy or fatty foods. General instructions  Drink enough fluid to keep your pee (urine) clear or pale yellow.  Wash your hands often. If you cannot use soap and water, use hand sanitizer.  Make sure that all people in your home wash their hands well and often.  Take over-the-counter and prescription medicines only as told by your doctor.  Rest at home while you get better.  Watch your condition for any changes.  Take a warm bath to help with any burning or pain from having diarrhea.  Keep all follow-up visits  as told by your doctor. This is important. Contact a doctor if:  You have a fever.  Your diarrhea gets worse.  You have new symptoms.  You cannot keep fluids down.  You feel light-headed or dizzy.  You have a headache.  You have muscle cramps. Get help right away if:  You have chest pain.  You feel very weak or you pass out (faint).  You have bloody or black poop or poop that look like tar.  You have very bad pain, cramping, or bloating in your belly (abdomen).  You have trouble breathing or you are breathing very quickly.  Your heart is beating very quickly.  Your skin feels cold and clammy.  You feel confused.  You have signs of dehydration, such as:  Dark pee, hardly any pee, or no pee.  Cracked lips.  Dry mouth.  Sunken eyes.  Sleepiness.  Weakness. This information is not intended to replace advice given to you by your health care provider. Make sure you discuss any questions you have with your health care provider. Document Released: 06/29/2007 Document Revised: 07/31/2015 Document Reviewed: 09/16/2014  2017  Elsevier   The entirety of the information documented in the History of Present Illness, Review of Systems and Physical Exam were personally obtained by me. Portions of this information were initially documented by [place] and reviewed by me for thoroughness and accuracy.             Trinna Post, PA-C  [facility]

## 2016-01-22 NOTE — Telephone Encounter (Signed)
Okay, thank you!

## 2016-01-22 NOTE — Telephone Encounter (Signed)
Patient reports he just came back from the [place] on 01/19/16. Patient reports he is having cold symptoms sore throat, head cold, sinus congestion, chills nausea and chest congestion. Patient is coming in at 2:30 pm. Patient reports that he has been taking OTC Dyquil, Nyquil, Theraflu. Patient denies fever, vomiting or diarrhea. Please review. Thank you. sd

## 2016-01-22 NOTE — Patient Instructions (Signed)
Diarrhea, Adult °Introduction °Diarrhea is when you have loose and water poop (stool) often. Diarrhea can make you feel weak and cause you to get dehydrated. Dehydration can make you tired and thirsty, make you have a dry mouth, and make it so you pee (urinate) less often. Diarrhea often lasts 2-3 days. However, it can last longer if it is a sign of something more serious. It is important to treat your diarrhea as told by your doctor. °Follow these instructions at home: °Eating and drinking °Follow these recommendations as told by your doctor: °· Take an oral rehydration solution (ORS). This is a drink that is sold at pharmacies and stores. °· Drink clear fluids, such as: °¨ Water. °¨ Ice chips. °¨ Diluted fruit juice. °¨ Low-calorie sports drinks. °· Eat bland, easy-to-digest foods in small amounts as you are able. These foods include: °¨ Bananas. °¨ Applesauce. °¨ Rice. °¨ Low-fat (lean) meats. °¨ Toast. °¨ Crackers. °· Avoid drinking fluids that have a lot of sugar or caffeine in them. °· Avoid alcohol. °· Avoid spicy or fatty foods. °General instructions °· Drink enough fluid to keep your pee (urine) clear or pale yellow. °· Wash your hands often. If you cannot use soap and water, use hand sanitizer. °· Make sure that all people in your home wash their hands well and often. °· Take over-the-counter and prescription medicines only as told by your doctor. °· Rest at home while you get better. °· Watch your condition for any changes. °· Take a warm bath to help with any burning or pain from having diarrhea. °· Keep all follow-up visits as told by your doctor. This is important. °Contact a doctor if: °· You have a fever. °· Your diarrhea gets worse. °· You have new symptoms. °· You cannot keep fluids down. °· You feel light-headed or dizzy. °· You have a headache. °· You have muscle cramps. °Get help right away if: °· You have chest pain. °· You feel very weak or you pass out (faint). °· You have bloody or black  poop or poop that look like tar. °· You have very bad pain, cramping, or bloating in your belly (abdomen). °· You have trouble breathing or you are breathing very quickly. °· Your heart is beating very quickly. °· Your skin feels cold and clammy. °· You feel confused. °· You have signs of dehydration, such as: °¨ Dark pee, hardly any pee, or no pee. °¨ Cracked lips. °¨ Dry mouth. °¨ Sunken eyes. °¨ Sleepiness. °¨ Weakness. °This information is not intended to replace advice given to you by your health care provider. Make sure you discuss any questions you have with your health care provider. °Document Released: 06/29/2007 Document Revised: 07/31/2015 Document Reviewed: 09/16/2014 °© 2017 Elsevier ° °

## 2016-01-22 NOTE — Telephone Encounter (Signed)
For your review since he is on your schedule

## 2016-01-29 ENCOUNTER — Encounter: Payer: Self-pay | Admitting: Family Medicine

## 2016-01-29 ENCOUNTER — Ambulatory Visit (INDEPENDENT_AMBULATORY_CARE_PROVIDER_SITE_OTHER): Payer: 59 | Admitting: Family Medicine

## 2016-01-29 VITALS — BP 126/80 | HR 79 | Temp 98.2°F | Resp 16 | Wt 256.4 lb

## 2016-01-29 DIAGNOSIS — H60391 Other infective otitis externa, right ear: Secondary | ICD-10-CM

## 2016-01-29 DIAGNOSIS — J0120 Acute ethmoidal sinusitis, unspecified: Secondary | ICD-10-CM

## 2016-01-29 MED ORDER — NEOMYCIN-POLYMYXIN-HC 3.5-10000-1 OT SOLN: 4.0000 [drp] | Freq: Four times a day (QID) | OTIC | 0 refills | Status: DC

## 2016-01-29 MED ORDER — AMOXICILLIN-POT CLAVULANATE 875-125 MG PO TABS: 1.0000 | ORAL_TABLET | Freq: Two times a day (BID) | ORAL | 0 refills | Status: DC

## 2016-01-29 NOTE — Progress Notes (Signed)
Subjective:     Patient ID: Robert Franklin, male   DOB: 1965-11-02, 51 y.o.   MRN: [id number]  HPI  Chief Complaint  Patient presents with  . Cough    Patient comes in office today with complaints of cough and congestion that has been intermittent since 01/20/16. Paitent reports that in the pst 2 days symptoms have worsened and now he has popping and drainage from right ear. Patient has taken otc Day/Nyquil, Alka-Seltzer, Airborne, Mucninex, Theraflu and Afrin nasal spray.   Patient reports increased sinus pressure, purulent sinus post nasal drainage and accompanying cough. States he does clean out his ears periodically with otc products.   Review of Systems     Objective:   Physical Exam  Constitutional: He appears well-developed and well-nourished. No distress.  Ears: L TM no inflammation. R TM obscured by canal debris which is 50% patent. +tragal tenderness Sinuses: mild paranasal sinus tenderness Throat: tonsils are absent Neck: no cervical adenopathy Lungs: clear     Assessment:    1. Acute non-recurrent ethmoidal sinusitis - amoxicillin-clavulanate (AUGMENTIN) 875-125 MG tablet; Take 1 tablet by mouth 2 (two) times daily.  Dispense: 20 tablet; Refill: 0  2. Other infective acute otitis externa of right ear - neomycin-polymyxin-hydrocortisone (CORTISPORIN) otic solution; Place 4 drops into the right ear 4 (four) times daily.  Dispense: 10 mL; Refill: 0    Plan:    Discussed use of Mucinex D and Delsym. Consider ENT referral if otitis recurs.

## 2016-01-29 NOTE — Patient Instructions (Signed)
Discussed use of Mucinex D for congestion and Delsym.

## 2016-02-08 ENCOUNTER — Other Ambulatory Visit: Payer: Self-pay

## 2016-02-08 ENCOUNTER — Encounter: Payer: Self-pay | Admitting: Family Medicine

## 2016-02-08 ENCOUNTER — Ambulatory Visit (INDEPENDENT_AMBULATORY_CARE_PROVIDER_SITE_OTHER): Payer: 59 | Admitting: Family Medicine

## 2016-02-08 VITALS — BP 122/84 | HR 85 | Temp 98.1°F | Resp 16 | Wt 248.4 lb

## 2016-02-08 DIAGNOSIS — H6690 Otitis media, unspecified, unspecified ear: Secondary | ICD-10-CM

## 2016-02-08 MED ORDER — AMOXICILLIN-POT CLAVULANATE 875-125 MG PO TABS: 1.0000 | ORAL_TABLET | Freq: Two times a day (BID) | ORAL | 0 refills | Status: DC

## 2016-02-08 NOTE — Progress Notes (Signed)
Patient: Robert Franklin Male    DOB: 08/02/1965   51 y.o.   MRN: [id number] Visit Date: 02/08/2016  Today's Provider: Vernie Murders, PA   Chief Complaint  Patient presents with  . Ear Pain    bilateral   Subjective:    Otalgia   There is pain in both ears. This is a new problem. The problem occurs constantly. The problem has been gradually worsening. The pain is at a severity of 8/10. Associated symptoms include ear discharge. He has tried antibiotics and ear drops for the symptoms. The treatment provided no relief.   Patient Active Problem List   Diagnosis Date Noted  . Medication management 01/01/2016  . Hypertriglyceridemia without hypercholesterolemia 02/26/2015  . Abnormally low high density lipoprotein (HDL) cholesterol with hypertriglyceridemia 02/26/2015  . Atrial premature contractions 02/26/2015  . Metabolic syndrome [id number]  . Rapid heart beat 02/26/2015  . Signs and symptoms involving emotional state 05/29/2014  . Anxiety 05/29/2014  . Adult BMI 30+ 05/29/2014  . Clinical depression 05/29/2014  . Failure of erection 05/29/2014  . Abnormal LFTs 05/29/2014  . Blood glucose elevated 05/29/2014  . GERD (gastroesophageal reflux disease) 05/29/2014  . Combined fat and carbohydrate induced hyperlipemia 05/29/2014  . Essential hypertension 05/29/2014  . Headache, migraine 05/29/2014  . Obesity, Class II, BMI 35-39.9 05/29/2014  . Gastroduodenal ulcer 05/29/2014  . Apnea, sleep 05/29/2014  . Umbilical hernia without obstruction and without gangrene 05/29/2014  . Avitaminosis D 09/14/2009  . Arteriosclerosis of coronary artery 02/27/2009   Past Surgical History:  Procedure Laterality Date  . APPENDECTOMY    . CARDIAC CATHETERIZATION  02/2008   Dr. Humphrey Rolls @ [facility]: 40-50% LCx lesion by cath,  EF 60% with normal wall motion.  [name] Nuclear Stress Test   January 2010    heart rate increased from 80-158 BPM. No EKG changes.  there is a moderate sized  reversible  defect in the inferior and inferior lateral wall as well as anteroseptal. EF 59%.  . CardioNet Holter Monitor   January 2010    sinus rhythm. Rare PVCs in setting was. Current PACs , seen in singles and pairs. Max heart rate 127, minimum heart rate 57. Average 84.  . CHOLECYSTECTOMY    . HERNIA REPAIR    . INNER EAR SURGERY    . TONSILLECTOMY     Family History  Problem Relation Age of Onset  . Hypertension Mother   . Irritable bowel syndrome Mother   . Migraines Mother   . Lumbar disc disease Mother   . Heart attack Mother   . Heart attack Maternal Uncle   . Heart attack Maternal Grandfather    No Known Allergies   Previous Medications   AMOXICILLIN-CLAVULANATE (AUGMENTIN) 875-125 MG TABLET    Take 1 tablet by mouth 2 (two) times daily.   BUTALBITAL-ACETAMINOPHEN-CAFFEINE (FIORICET) 50-325-40 MG TABLET    Take 1 tablet by mouth every 6 (six) hours as needed for headache or migraine.   CYCLOBENZAPRINE (FLEXERIL) 5 MG TABLET    Take 1 tablet (5 mg total) by mouth 3 (three) times daily as needed for muscle spasms.   DIPHENOXYLATE-ATROPINE (LOMOTIL) 2.5-0.025 MG TABLET    Take 2 tablets by mouth 3 (three) times daily as needed for diarrhea or loose stools.   FENOFIBRATE (TRICOR) 145 MG TABLET    TAKE 1 TABLET BY MOUTH DAILY AFTER SUPPER. 150MG  NO LONGER ON THE MARKET   FENOFIBRATE 160 MG TABLET       ICOSAPENT ETHYL (  VASCEPA) 1 G CAPS    Take 2 capsules by mouth 2 (two) times daily.   LOSARTAN-HYDROCHLOROTHIAZIDE (HYZAAR) 100-25 MG TABLET    TAKE 1 TABLET BY MOUTH DAILY   MECLIZINE (ANTIVERT) 25 MG TABLET    Take 1 tablet (25 mg total) by mouth 3 (three) times daily as needed for dizziness.   MELOXICAM (MOBIC) 15 MG TABLET    Take 15 mg by mouth as needed.    METOPROLOL SUCCINATE (TOPROL-XL) 100 MG 24 HR TABLET    TAKE 1 TABLET BY MOUTH EVERY DAY   NEOMYCIN-POLYMYXIN-HYDROCORTISONE (CORTISPORIN) OTIC SOLUTION    Place 4 drops into the right ear 4 (four) times daily.   OMEGA-3 ACID ETHYL  ESTERS (LOVAZA) 1 G CAPSULE    Take 1 capsule (1 g total) by mouth daily.   OMEPRAZOLE (PRILOSEC OTC) 20 MG TABLET    Take 1 tablet (20 mg total) by mouth daily.   PROMETHAZINE (PHENERGAN) 12.5 MG TABLET    Take 1 tablet (12.5 mg total) by mouth every 6 (six) hours as needed for nausea or vomiting.   TADALAFIL (CIALIS) 20 MG TABLET    Take 0.5 tablets (10 mg total) by mouth daily as needed for erectile dysfunction.    Review of Systems  Constitutional: Negative.   HENT: Positive for ear discharge and ear pain.   Respiratory: Negative.   Cardiovascular: Negative.     Social History  Substance Use Topics  . Smoking status: Former Smoker    Packs/day: 1.00    Years: 28.00    Types: Cigarettes    Quit date: 12/24/1996  . Smokeless tobacco: Never Used  . Alcohol use Yes     Comment: seldom   Objective:   BP 122/84 (BP Location: Right Arm, Patient Position: Sitting, Cuff Size: Normal)   Pulse 85   Temp 98.1 F (36.7 C) (Oral)   Resp 16   Wt 248 lb 6.4 oz (112.7 kg)   SpO2 96%   BMI 36.16 kg/m   Physical Exam  Constitutional: He is oriented to person, place, and time. He appears well-developed and well-nourished.  HENT:  Head: Normocephalic.  Left Ear: External ear normal.  Nose: Nose normal.  Right TM reddened with dull reflex. Some brown to black debris in the canal. Diminished hearing. Left canal clear. Slight irritation to posterior pharynx. No exudates.  Eyes: Conjunctivae are normal.  Neck: Neck supple.  Cardiovascular: Normal rate and regular rhythm.   Pulmonary/Chest: Effort normal and breath sounds normal.  Abdominal: Soft. Bowel sounds are normal.  Lymphadenopathy:    He has no cervical adenopathy.  Neurological: He is alert and oriented to person, place, and time.      Assessment & Plan:     1. Persistent acute otitis media Onset 2 weeks ago with a cold and a flight for 14 hours. Still having drainage from the right ear at night despite treatment with  Cortisporin ear drops. Hearing slightly diminished but no dizziness. Still having headaches. Will refill the Augmentin and continue Cortisporin. Schedule ENT referral and recheck prn. May use Zyrtec or Claritin with Mucinex and Tylenol or Advil prn pain. Increase fluid intake. - amoxicillin-clavulanate (AUGMENTIN) 875-125 MG tablet; Take 1 tablet by mouth 2 (two) times daily.  Dispense: 20 tablet; Refill: 0 - Ambulatory referral to ENT

## 2016-02-12 ENCOUNTER — Telehealth: Payer: Self-pay

## 2016-02-12 ENCOUNTER — Ambulatory Visit (INDEPENDENT_AMBULATORY_CARE_PROVIDER_SITE_OTHER): Payer: 59 | Admitting: Family Medicine

## 2016-02-12 ENCOUNTER — Encounter: Payer: Self-pay | Admitting: Family Medicine

## 2016-02-12 VITALS — BP 126/82 | HR 76 | Temp 98.0°F | Resp 18 | Wt 250.0 lb

## 2016-02-12 DIAGNOSIS — H60501 Unspecified acute noninfective otitis externa, right ear: Secondary | ICD-10-CM | POA: Diagnosis not present

## 2016-02-12 MED ORDER — LEVOFLOXACIN 500 MG PO TABS: 500.0000 mg | ORAL_TABLET | Freq: Every day | ORAL | 0 refills | Status: DC

## 2016-02-12 MED ORDER — HYDROCORTISONE-ACETIC ACID 1-2 % OT SOLN: 4.0000 [drp] | Freq: Four times a day (QID) | OTIC | 1 refills | Status: DC

## 2016-02-12 MED ORDER — PREDNISONE 10 MG PO TABS: ORAL_TABLET | ORAL | 0 refills | Status: AC

## 2016-02-12 NOTE — Progress Notes (Signed)
Patient: Robert Franklin Male    DOB: [birth year]   51 y.o.   MRN: [id number] Visit Date: 02/12/2016  Today's Provider: Lelon Huh, MD   No chief complaint on file.  Subjective:    HPI Ear Pain:  Patient was last seen in the office for Persistent acute otitis media x 2 weeks. Patient was seen by Robert Murders PA-C and was given a 2nd round of Augmentin. Patient was advised to continue Cortisporin and a referral to ENT was initiated. Patient comes in today reporting his symptoms have worsened. He has been out of work all week to to ear pain and head ache. He has also been having chills and sweats, body aches, sinus pressure,  right ear drainage and nausea. Patient reports good compliance with treatment and poor symptom control.     12/24/2015 Robert ChauvinTx Otitis externa and prescribed cortisporin which worked well.  12/29 Robert Franklin Symptom returned and prescribed Cirpo 01/29/2016 Franklin Augmentin and cortisporin 02/08/16 Robert Franklin Refilled Augmentin and referred ENT.      No Known Allergies   Current Outpatient Prescriptions:  .  amoxicillin-clavulanate (AUGMENTIN) 875-125 MG tablet, Take 1 tablet by mouth 2 (two) times daily., Disp: 20 tablet, Rfl: 0 .  butalbital-acetaminophen-caffeine (FIORICET) 50-325-40 MG tablet, Take 1 tablet by mouth every 6 (six) hours as needed for headache or migraine., Disp: 55 tablet, Rfl: 1 .  cyclobenzaprine (FLEXERIL) 5 MG tablet, Take 1 tablet (5 mg total) by mouth 3 (three) times daily as needed for muscle spasms., Disp: 30 tablet, Rfl: 0 .  diphenoxylate-atropine (LOMOTIL) 2.5-0.025 MG tablet, Take 2 tablets by mouth 3 (three) times daily as needed for diarrhea or loose stools., Disp: 20 tablet, Rfl: 0 .  fenofibrate (TRICOR) 145 MG tablet, TAKE 1 TABLET BY MOUTH DAILY AFTER SUPPER. 150MG  NO LONGER ON THE MARKET, Disp: 30 tablet, Rfl: 8 .  fenofibrate 160 MG tablet, , Disp: , Rfl:  .  Icosapent Ethyl (VASCEPA) 1 g CAPS, Take 2  capsules by mouth 2 (two) times daily., Disp: 120 capsule, Rfl: O .  losartan-hydrochlorothiazide (HYZAAR) 100-25 MG tablet, TAKE 1 TABLET BY MOUTH DAILY, Disp: 90 tablet, Rfl: 2 .  meclizine (ANTIVERT) 25 MG tablet, Take 1 tablet (25 mg total) by mouth 3 (three) times daily as needed for dizziness., Disp: 30 tablet, Rfl: 0 .  meloxicam (MOBIC) 15 MG tablet, Take 15 mg by mouth as needed. , Disp: , Rfl:  .  metoprolol succinate (TOPROL-XL) 100 MG 24 hr tablet, TAKE 1 TABLET BY MOUTH EVERY DAY, Disp: 30 tablet, Rfl: 12 .  neomycin-polymyxin-hydrocortisone (CORTISPORIN) otic solution, Place 4 drops into the right ear 4 (four) times daily., Disp: 10 mL, Rfl: 0 .  omega-3 acid ethyl esters (LOVAZA) 1 g capsule, Take 1 capsule (1 g total) by mouth daily., Disp: 30 capsule, Rfl: 6 .  omeprazole (PRILOSEC OTC) 20 MG tablet, Take 1 tablet (20 mg total) by mouth daily., Disp: 30 tablet, Rfl: 12 .  promethazine (PHENERGAN) 12.5 MG tablet, Take 1 tablet (12.5 mg total) by mouth every 6 (six) hours as needed for nausea or vomiting., Disp: 30 tablet, Rfl: 0 .  tadalafil (CIALIS) 20 MG tablet, Take 0.5 tablets (10 mg total) by mouth daily as needed for erectile dysfunction., Disp: 10 tablet, Rfl: 12  Review of Systems  Constitutional: Positive for chills, diaphoresis and fatigue. Negative for appetite change and fever.  HENT: Positive for congestion, ear discharge, ear pain, postnasal drip, rhinorrhea, sinus  pain and sinus pressure. Negative for sore throat.   Respiratory: Negative for cough, chest tightness, shortness of breath and wheezing.   Cardiovascular: Negative for chest pain and palpitations.  Gastrointestinal: Positive for nausea. Negative for abdominal pain and vomiting.  Neurological: Positive for headaches.    Social History  Substance Use Topics  . Smoking status: Former Smoker    Packs/day: 1.00    Years: 28.00    Types: Cigarettes    Quit date: 12/24/1996  . Smokeless tobacco: Never Used    . Alcohol use Yes     Comment: seldom   Objective:   BP 126/82 (BP Location: Right Arm, Patient Position: Sitting, Cuff Size: Large)   Pulse 76   Temp 98 F (36.7 C) (Oral)   Resp 18   Wt 250 lb (113.4 kg)   SpO2 97% Comment: room air  BMI 36.39 kg/m   Physical Exam  General Appearance:    Alert, cooperative, no distress  HENT:   neck without nodes, throat normal without erythema or exudate and sinuses non-tender. Moderate dark yellow and green discharge noted in right ear canal.   Eyes:    PERRL, conjunctiva/corneas clear, EOM's intact       Lungs:     Clear to auscultation bilaterally, respirations unlabored  Heart:    Regular rate and rhythm  Neurologic:   Awake, alert, oriented x 3. No apparent focal neurological           defect.           Assessment & Plan:     1. Acute otitis externa of right ear, unspecified type  - WOUND CULTURE - levofloxacin (LEVAQUIN) 500 MG tablet; Take 1 tablet (500 mg total) by mouth daily.  Dispense: 7 tablet; Refill: 0 - predniSONE (DELTASONE) 10 MG tablet; 6 tablets for 1 day, then 5 for 1 day, then 4 for 1 day, then 3 for 1 day, then 2 for 1 day then 1 for 1 day.  Dispense: 21 tablet; Refill: 0 - acetic acid-hydrocortisone (VOSOL-HC) otic solution; Place 4 drops into the right ear 4 (four) times daily.  Dispense: 10 mL; Refill: 1       Lelon Huh, MD  [facility]

## 2016-02-12 NOTE — Telephone Encounter (Signed)
Patient called and states that his ear is still hurting and having drainage, he has appointment with Dr Virgia Land on 02/25/16 and not able to work. What can he do or try at this point? He has not been able to work for over a week now-aa

## 2016-02-12 NOTE — Telephone Encounter (Signed)
May try Afrin Nasal Spray TID for 2-3 days to try to release pressure on the eardrum since BP was in good shape. Continue antibiotic and may also use Tylenol or Advil for pain. Recheck next week if any worsening.

## 2016-02-12 NOTE — Telephone Encounter (Signed)
Pt has appointment today to be seen

## 2016-02-12 NOTE — Telephone Encounter (Signed)
He stated earlier when called I missed it in my message that the last antibiotic prescribed has not improved his symptoms at all. He has hard time sleeping. Very uncomfortable-aa

## 2016-02-15 LAB — WOUND CULTURE: Organism ID, Bacteria: NONE SEEN

## 2016-02-15 LAB — PLEASE NOTE

## 2016-02-15 LAB — SPECIMEN STATUS REPORT

## 2016-03-21 ENCOUNTER — Encounter: Payer: Managed Care, Other (non HMO) | Admitting: Family Medicine

## 2016-03-28 ENCOUNTER — Ambulatory Visit (INDEPENDENT_AMBULATORY_CARE_PROVIDER_SITE_OTHER): Payer: 59 | Admitting: Family Medicine

## 2016-03-28 VITALS — BP 118/88 | HR 68 | Temp 98.1°F | Resp 16 | Ht 69.0 in | Wt 256.0 lb

## 2016-03-28 DIAGNOSIS — E119 Type 2 diabetes mellitus without complications: Secondary | ICD-10-CM

## 2016-03-28 DIAGNOSIS — K219 Gastro-esophageal reflux disease without esophagitis: Secondary | ICD-10-CM

## 2016-03-28 DIAGNOSIS — I10 Essential (primary) hypertension: Secondary | ICD-10-CM | POA: Diagnosis not present

## 2016-03-28 DIAGNOSIS — Z0000 Encounter for general adult medical examination without abnormal findings: Secondary | ICD-10-CM | POA: Diagnosis not present

## 2016-03-28 DIAGNOSIS — Z1211 Encounter for screening for malignant neoplasm of colon: Secondary | ICD-10-CM | POA: Diagnosis not present

## 2016-03-28 DIAGNOSIS — J329 Chronic sinusitis, unspecified: Secondary | ICD-10-CM | POA: Diagnosis not present

## 2016-03-28 DIAGNOSIS — Z3141 Encounter for fertility testing: Secondary | ICD-10-CM | POA: Diagnosis not present

## 2016-03-28 DIAGNOSIS — Z125 Encounter for screening for malignant neoplasm of prostate: Secondary | ICD-10-CM | POA: Diagnosis not present

## 2016-03-28 LAB — POCT GLYCOSYLATED HEMOGLOBIN (HGB A1C): Hemoglobin A1C: 7

## 2016-03-28 MED ORDER — METFORMIN HCL 500 MG PO TABS: 500.0000 mg | ORAL_TABLET | Freq: Every day | ORAL | 5 refills | Status: DC

## 2016-03-28 MED ORDER — AMOXICILLIN-POT CLAVULANATE 875-125 MG PO TABS: 1.0000 | ORAL_TABLET | Freq: Two times a day (BID) | ORAL | 0 refills | Status: DC

## 2016-03-28 NOTE — Progress Notes (Signed)
Patient: Robert Franklin, Male    DOB: 1965-07-06, 51 y.o.   MRN: [id number] Visit Date: 03/28/2016  Today's Provider: [name], MD   Chief Complaint  Patient presents with  . Annual Exam   Subjective:  Robert Franklin is a 51 y.o. male who presents today for health maintenance and complete physical. He feels poorly. He reports exercising not at this time. He reports he is sleeping fairly well. Immunization History  Administered Date(s) Administered  . Tdap 01/28/2010   Depression screen [facility] 2/9 03/28/2016 07/14/2014  Decreased Interest 1 0  Down, Depressed, Hopeless 0 0  PHQ - 2 Score 1 0  Altered sleeping 1 -  Tired, decreased energy 3 -  Change in appetite 3 -  Feeling bad or failure about yourself  1 -  Trouble concentrating 0 -  Moving slowly or fidgety/restless 0 -  Suicidal thoughts 0 -  PHQ-9 Score 9 -    Review of Systems  Constitutional: Positive for appetite change and chills.  HENT: Positive for congestion, ear discharge, ear pain, hearing loss, nosebleeds, postnasal drip, rhinorrhea, sinus pain, sneezing, sore throat and tinnitus.   Eyes: Negative.   Respiratory: Positive for cough, choking, chest tightness and shortness of breath.   Cardiovascular: Negative.   Gastrointestinal: Negative.   Endocrine: Negative.   Genitourinary: Positive for discharge and frequency.  Musculoskeletal: Positive for arthralgias, back pain, myalgias, neck pain and neck stiffness.  Skin: Negative.   Allergic/Immunologic: Negative.   Neurological: Positive for dizziness, light-headedness, numbness and headaches.  Hematological: Negative.   Psychiatric/Behavioral: Negative.     Social History   Social History  . Marital status: Divorced    Spouse name: Collie Siad  . Number of children: 0  . Years of education: 14   Occupational History  . [employer]    Social History Main Topics  . Smoking status: Former Smoker    Packs/day: 1.00    Years: 28.00    Types: Cigarettes     Quit date: 12/24/1996  . Smokeless tobacco: Never Used  . Alcohol use Yes     Comment: seldom  . Drug use: No  . Sexual activity: Not Currently   Other Topics Concern  . 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    Patient Active Problem List   Diagnosis Date Noted  . Medication management 01/01/2016  . Hypertriglyceridemia without hypercholesterolemia 02/26/2015  . Abnormally low high density lipoprotein (HDL) cholesterol with hypertriglyceridemia 02/26/2015  . Atrial premature contractions 02/26/2015  . Metabolic syndrome [id number]  . Rapid heart beat 02/26/2015  . Signs and symptoms involving emotional state 05/29/2014  . Anxiety 05/29/2014  . Adult BMI 30+ 05/29/2014  . Clinical depression 05/29/2014  . Failure of erection 05/29/2014  . Abnormal LFTs 05/29/2014  . Blood glucose elevated 05/29/2014  . GERD (gastroesophageal reflux disease) 05/29/2014  . Combined fat and carbohydrate induced hyperlipemia 05/29/2014  . Essential hypertension 05/29/2014  . Headache, migraine 05/29/2014  . Obesity, Class II, BMI 35-39.9 05/29/2014  . Gastroduodenal ulcer 05/29/2014  . Apnea, sleep 05/29/2014  . Umbilical hernia without obstruction and without gangrene 05/29/2014  . Avitaminosis D 09/14/2009  . Arteriosclerosis of coronary artery 02/27/2009    Past Surgical History:  Procedure Laterality Date  . APPENDECTOMY    . CARDIAC CATHETERIZATION  02/2008   Dr.  Khan @ [place]: 40-50% LCx lesion by cath,  EF 60% with normal wall motion.  [name] Nuclear Stress Test   January 2010    heart rate increased from 80-158 BPM. No EKG changes.  there is a moderate sized  reversible defect in the inferior and inferior lateral wall as well as anteroseptal. EF 59%.  . CardioNet Holter Monitor   January 2010    sinus rhythm. Rare PVCs in setting was. Current PACs , seen in  singles and pairs. Max heart rate 127, minimum heart rate 57. Average 84.  . CHOLECYSTECTOMY    . HERNIA REPAIR    . INNER EAR SURGERY    . TONSILLECTOMY      His family history includes Heart attack in his maternal grandfather, maternal uncle, and mother; Hypertension in his mother; Irritable bowel syndrome in his mother; Lumbar disc disease in his mother; Migraines in his mother.     Outpatient Encounter Prescriptions as of 03/28/2016  Medication Sig Note  . acetic acid-hydrocortisone (VOSOL-HC) otic solution Place 4 drops into the right ear 4 (four) times daily.   . butalbital-acetaminophen-caffeine (FIORICET) 50-325-40 MG tablet Take 1 tablet by mouth every 6 (six) hours as needed for headache or migraine.   . cyclobenzaprine (FLEXERIL) 5 MG tablet Take 1 tablet (5 mg total) by mouth 3 (three) times daily as needed for muscle spasms.   . diphenoxylate-atropine (LOMOTIL) 2.5-0.025 MG tablet Take 2 tablets by mouth 3 (three) times daily as needed for diarrhea or loose stools.   . fenofibrate (TRICOR) 145 MG tablet TAKE 1 TABLET BY MOUTH DAILY AFTER SUPPER. 150MG  NO LONGER ON THE MARKET   . fenofibrate 160 MG tablet  02/08/2016: Received from: External Pharmacy  . Icosapent Ethyl (VASCEPA) 1 g CAPS Take 2 capsules by mouth 2 (two) times daily.   Marland Kitchen losartan-hydrochlorothiazide (HYZAAR) 100-25 MG tablet TAKE 1 TABLET BY MOUTH DAILY   . meclizine (ANTIVERT) 25 MG tablet Take 1 tablet (25 mg total) by mouth 3 (three) times daily as needed for dizziness.   . meloxicam (MOBIC) 15 MG tablet Take 15 mg by mouth as needed.  05/29/2014: Received from: [facility]  . metoprolol succinate (TOPROL-XL) 100 MG 24 hr tablet TAKE 1 TABLET BY MOUTH EVERY DAY   . omega-3 acid ethyl esters (LOVAZA) 1 g capsule Take 1 capsule (1 g total) by mouth daily.   Marland Kitchen omeprazole (PRILOSEC OTC) 20 MG tablet Take 1 tablet (20 mg total) by mouth daily.   . promethazine (PHENERGAN) 12.5 MG tablet Take 1 tablet  (12.5 mg total) by mouth every 6 (six) hours as needed for nausea or vomiting.   . tadalafil (CIALIS) 20 MG tablet Take 0.5 tablets (10 mg total) by mouth daily as needed for erectile dysfunction.   . [DISCONTINUED] amoxicillin-clavulanate (AUGMENTIN) 875-125 MG tablet Take 1 tablet by mouth 2 (two) times daily.   . [DISCONTINUED] levofloxacin (LEVAQUIN) 500 MG tablet Take 1 tablet (500 mg total) by mouth daily.   . [DISCONTINUED] neomycin-polymyxin-hydrocortisone (CORTISPORIN) otic solution Place 4 drops into the right ear 4 (four) times daily.    No facility-administered encounter medications on file as of 03/28/2016.     Patient Care Team: [name]., MD as PCP - General (Family Medicine)      Objective:   Vitals:  Vitals:   03/28/16 1413  BP: 118/88  Pulse: 68  Resp: 16  Temp: 98.1 F (36.7 C)  Weight: 256 lb (116.1 kg)  Height:  5\' 9"  (1.753 m)    Physical Exam  Constitutional: He is oriented to person, place, and time. He appears well-developed and well-nourished.  HENT:  Head: Normocephalic and atraumatic.  Right Ear: External ear normal.  Left Ear: External ear normal.  Nose: Nose normal.  Eyes: Conjunctivae are normal. No scleral icterus.  Neck: No thyromegaly present.  Cardiovascular: Normal rate, regular rhythm and normal heart sounds.   Pulmonary/Chest: Effort normal and breath sounds normal.  Abdominal: Soft.  Neurological: He is alert and oriented to person, place, and time.  Skin: Skin is warm and dry.  Psychiatric: He has a normal mood and affect. His behavior is normal. Judgment and thought content normal.     Depression Screen PHQ 2/9 Scores 03/28/2016 07/14/2014  PHQ - 2 Score 1 0  PHQ- 9 Score 9 -    Assessment & Plan:   1. Annual physical exam - CBC w/Diff/Platelet - Comprehensive metabolic panel - Lipid Panel With LDL/HDL Ratio - POCT Urinalysis Dipstick - TSH  2. Prostate cancer screening - PSA  3. Essential hypertension  4.  Controlled type 2 diabetes mellitus without complication, unspecified long term insulin use status (HCC) A1C 7.0. Worse. Start Metformin 500 mg 1 tablet daily and re check in 3 months. - POCT HgB A1C  5. Gastroesophageal reflux disease, esophagitis presence not specified  6. Encounter for sperm count for fertility testing - Ambulatory referral to Urology. Pt planning to marry [place] woman and wants to start a family right away. I told him they generally recommend trying to conceive for 1 year but pt insisits on referral.  7. Colon cancer screening - Ambulatory referral to Gastroenterology  8. Chronic sinusitis, unspecified location Treat with Augmentin, advised patient to call ENT to re schedule his missed appointment.  HPI, Exam and A&P transcribed under direction and in the presence of Miguel Aschoff, MD. I have done the exam and reviewed the chart and it is accurate to the best of my knowledge. Development worker, community has been used and  any errors in dictation or transcription are unintentional. Miguel Aschoff M.D. [facility]

## 2016-03-29 LAB — CBC WITH DIFFERENTIAL/PLATELET
Basophils Absolute: 0 10*3/uL (ref 0.0–0.2)
Basos: 0 %
EOS (ABSOLUTE): 0.1 10*3/uL (ref 0.0–0.4)
EOS: 2 %
HEMATOCRIT: 45.2 % (ref 37.5–51.0)
HEMOGLOBIN: 16 g/dL (ref 13.0–17.7)
IMMATURE GRANS (ABS): 0.1 10*3/uL (ref 0.0–0.1)
IMMATURE GRANULOCYTES: 1 %
LYMPHS ABS: 2.8 10*3/uL (ref 0.7–3.1)
Lymphs: 31 %
MCH: 30 pg (ref 26.6–33.0)
MCHC: 35.4 g/dL (ref 31.5–35.7)
MCV: 85 fL (ref 79–97)
MONOCYTES: 6 %
Monocytes Absolute: 0.6 10*3/uL (ref 0.1–0.9)
Neutrophils Absolute: 5.6 10*3/uL (ref 1.4–7.0)
Neutrophils: 60 %
Platelets: 197 10*3/uL (ref 150–379)
RBC: 5.34 x10E6/uL (ref 4.14–5.80)
RDW: 13.2 % (ref 12.3–15.4)
WBC: 9.2 10*3/uL (ref 3.4–10.8)

## 2016-03-29 LAB — COMPREHENSIVE METABOLIC PANEL
ALBUMIN: 4.6 g/dL (ref 3.5–5.5)
ALT: 53 IU/L — ABNORMAL HIGH (ref 0–44)
AST: 48 IU/L — ABNORMAL HIGH (ref 0–40)
Albumin/Globulin Ratio: 1.5 (ref 1.2–2.2)
Alkaline Phosphatase: 74 IU/L (ref 39–117)
BUN / CREAT RATIO: 16 (ref 9–20)
BUN: 15 mg/dL (ref 6–24)
Bilirubin Total: 0.8 mg/dL (ref 0.0–1.2)
CALCIUM: 9.5 mg/dL (ref 8.7–10.2)
CO2: 27 mmol/L (ref 18–29)
CREATININE: 0.95 mg/dL (ref 0.76–1.27)
Chloride: 98 mmol/L (ref 96–106)
GFR, EST AFRICAN AMERICAN: 108 mL/min/{1.73_m2} (ref >59)
GFR, EST NON AFRICAN AMERICAN: 94 mL/min/{1.73_m2} (ref >59)
Globulin, Total: 3.1 g/dL (ref 1.5–4.5)
Glucose: 124 mg/dL — ABNORMAL HIGH (ref 65–99)
Potassium: 4.1 mmol/L (ref 3.5–5.2)
SODIUM: 141 mmol/L (ref 134–144)
TOTAL PROTEIN: 7.7 g/dL (ref 6.0–8.5)

## 2016-03-29 LAB — TSH: TSH: 1.67 u[IU]/mL (ref 0.450–4.500)

## 2016-03-29 LAB — LIPID PANEL WITH LDL/HDL RATIO
Cholesterol, Total: 207 mg/dL — ABNORMAL HIGH (ref 100–199)
HDL: 25 mg/dL — ABNORMAL LOW (ref >39)
Triglycerides: 574 mg/dL (ref 0–149)

## 2016-03-29 LAB — PSA: PROSTATE SPECIFIC AG, SERUM: 0.3 ng/mL (ref 0.0–4.0)

## 2016-04-05 ENCOUNTER — Telehealth: Payer: Self-pay | Admitting: Gastroenterology

## 2016-04-05 NOTE — Telephone Encounter (Signed)
Patient is returning your call.  

## 2016-04-06 ENCOUNTER — Other Ambulatory Visit: Payer: Self-pay

## 2016-04-06 DIAGNOSIS — Z86010 Personal history of colonic polyps: Secondary | ICD-10-CM

## 2016-04-06 NOTE — Telephone Encounter (Signed)
Gastroenterology Pre-Procedure Review  Request Date:04/26/16  Requesting Ph/ysician: Dr. Vicente Males  PATIENT REVIEW QUESTIONS: The patient responded to the following health history questions as indicated:    1. Are you having any GI issues? no 2. Do you have a personal history of Polyps? Yes 3. Do you have a family history of Colon Cancer or Polyps? no 4. Diabetes Mellitus? yes (Type II) 5. Joint replacements in the past 12 months?no 6. Major health problems in the past 3 months?no 7. Any artificial heart valves, MVP, or defibrillator?no    MEDICATIONS & ALLERGIES:    Patient reports the following regarding taking any anticoagulation/antiplatelet therapy:   Plavix, Coumadin, Eliquis, Xarelto, Lovenox, Pradaxa, Brilinta, or Effient? no Aspirin? no  Patient confirms/reports the following medications:  Current Outpatient Prescriptions  Medication Sig Dispense Refill  . acetic acid-hydrocortisone (VOSOL-HC) otic solution Place 4 drops into the right ear 4 (four) times daily. 10 mL 1  . amoxicillin-clavulanate (AUGMENTIN) 875-125 MG tablet Take 1 tablet by mouth 2 (two) times daily. 20 tablet 0  . butalbital-acetaminophen-caffeine (FIORICET) 50-325-40 MG tablet Take 1 tablet by mouth every 6 (six) hours as needed for headache or migraine. 55 tablet 1  . cyclobenzaprine (FLEXERIL) 5 MG tablet Take 1 tablet (5 mg total) by mouth 3 (three) times daily as needed for muscle spasms. 30 tablet 0  . diphenoxylate-atropine (LOMOTIL) 2.5-0.025 MG tablet Take 2 tablets by mouth 3 (three) times daily as needed for diarrhea or loose stools. 20 tablet 0  . fenofibrate (TRICOR) 145 MG tablet TAKE 1 TABLET BY MOUTH DAILY AFTER SUPPER. 150MG  NO LONGER ON THE MARKET 30 tablet 8  . fenofibrate 160 MG tablet     . Icosapent Ethyl (VASCEPA) 1 g CAPS Take 2 capsules by mouth 2 (two) times daily. 120 capsule O  . losartan-hydrochlorothiazide (HYZAAR) 100-25 MG tablet TAKE 1 TABLET BY MOUTH DAILY 90 tablet 2  . meclizine  (ANTIVERT) 25 MG tablet Take 1 tablet (25 mg total) by mouth 3 (three) times daily as needed for dizziness. 30 tablet 0  . meloxicam (MOBIC) 15 MG tablet Take 15 mg by mouth as needed.     . metFORMIN (GLUCOPHAGE) 500 MG tablet Take 1 tablet (500 mg total) by mouth daily with breakfast. 30 tablet 5  . metoprolol succinate (TOPROL-XL) 100 MG 24 hr tablet TAKE 1 TABLET BY MOUTH EVERY DAY 30 tablet 12  . omega-3 acid ethyl esters (LOVAZA) 1 g capsule Take 1 capsule (1 g total) by mouth daily. 30 capsule 6  . omeprazole (PRILOSEC OTC) 20 MG tablet Take 1 tablet (20 mg total) by mouth daily. 30 tablet 12  . promethazine (PHENERGAN) 12.5 MG tablet Take 1 tablet (12.5 mg total) by mouth every 6 (six) hours as needed for nausea or vomiting. 30 tablet 0  . tadalafil (CIALIS) 20 MG tablet Take 0.5 tablets (10 mg total) by mouth daily as needed for erectile dysfunction. 10 tablet 12   No current facility-administered medications for this visit.     Patient confirms/reports the following allergies:  No Known Allergies  No orders of the defined types were placed in this encounter.   AUTHORIZATION INFORMATION Primary Insurance: 1D#: Group #:  Secondary Insurance: 1D#: Group #:  SCHEDULE INFORMATION: Date: 04/26/16 Time: Location: [place]

## 2016-04-11 LAB — POCT URINALYSIS DIPSTICK
Bilirubin, UA: NEGATIVE
Blood, UA: NEGATIVE
Glucose, UA: NEGATIVE
Ketones, UA: NEGATIVE
Leukocytes, UA: NEGATIVE
Nitrite, UA: NEGATIVE
Protein, UA: NEGATIVE
Spec Grav, UA: 1.01 (ref 1.030–1.035)
Urobilinogen, UA: 0.2 (ref ?–2.0)
pH, UA: 6.5 (ref 5.0–8.0)

## 2016-04-19 DIAGNOSIS — H60333 Swimmer's ear, bilateral: Secondary | ICD-10-CM | POA: Diagnosis not present

## 2016-04-19 DIAGNOSIS — J018 Other acute sinusitis: Secondary | ICD-10-CM | POA: Diagnosis not present

## 2016-04-19 DIAGNOSIS — J300 Vasomotor rhinitis: Secondary | ICD-10-CM | POA: Diagnosis not present

## 2016-04-25 ENCOUNTER — Telehealth: Payer: Self-pay | Admitting: Gastroenterology

## 2016-04-25 NOTE — Telephone Encounter (Signed)
Robert Franklin with [facility] Pre Service [phone])  is call regarding the authorization on [name]'s colonoscopy. His procedure is on Tues 04/26/16.

## 2016-04-26 ENCOUNTER — Encounter
Admission: RE | Disposition: A | Discharge status: Home / Self Care | Payer: Self-pay | Source: Ambulatory Visit | Attending: Gastroenterology

## 2016-04-26 ENCOUNTER — Ambulatory Visit
Admission: RE | Admit: 2016-04-26 | Discharge: 2016-04-26 | Disposition: A | Discharge status: Home / Self Care | Payer: 59 | Source: Ambulatory Visit | Attending: Gastroenterology | Admitting: Gastroenterology

## 2016-04-26 ENCOUNTER — Encounter: Payer: Self-pay | Admitting: *Deleted

## 2016-04-26 ENCOUNTER — Ambulatory Visit: Payer: 59 | Admitting: Anesthesiology

## 2016-04-26 DIAGNOSIS — N289 Disorder of kidney and ureter, unspecified: Secondary | ICD-10-CM | POA: Insufficient documentation

## 2016-04-26 DIAGNOSIS — G4733 Obstructive sleep apnea (adult) (pediatric): Secondary | ICD-10-CM | POA: Diagnosis not present

## 2016-04-26 DIAGNOSIS — Z7984 Long term (current) use of oral hypoglycemic drugs: Secondary | ICD-10-CM | POA: Diagnosis not present

## 2016-04-26 DIAGNOSIS — D123 Benign neoplasm of transverse colon: Secondary | ICD-10-CM | POA: Insufficient documentation

## 2016-04-26 DIAGNOSIS — Z980 Intestinal bypass and anastomosis status: Secondary | ICD-10-CM | POA: Insufficient documentation

## 2016-04-26 DIAGNOSIS — K219 Gastro-esophageal reflux disease without esophagitis: Secondary | ICD-10-CM | POA: Insufficient documentation

## 2016-04-26 DIAGNOSIS — Z86010 Personal history of colonic polyps: Secondary | ICD-10-CM | POA: Insufficient documentation

## 2016-04-26 DIAGNOSIS — K5730 Diverticulosis of large intestine without perforation or abscess without bleeding: Secondary | ICD-10-CM | POA: Diagnosis not present

## 2016-04-26 DIAGNOSIS — Z1211 Encounter for screening for malignant neoplasm of colon: Secondary | ICD-10-CM | POA: Insufficient documentation

## 2016-04-26 DIAGNOSIS — I2510 Atherosclerotic heart disease of native coronary artery without angina pectoris: Secondary | ICD-10-CM | POA: Diagnosis not present

## 2016-04-26 DIAGNOSIS — R51 Headache: Secondary | ICD-10-CM | POA: Diagnosis not present

## 2016-04-26 DIAGNOSIS — Z79899 Other long term (current) drug therapy: Secondary | ICD-10-CM | POA: Diagnosis not present

## 2016-04-26 DIAGNOSIS — F419 Anxiety disorder, unspecified: Secondary | ICD-10-CM | POA: Insufficient documentation

## 2016-04-26 DIAGNOSIS — I10 Essential (primary) hypertension: Secondary | ICD-10-CM | POA: Diagnosis not present

## 2016-04-26 DIAGNOSIS — Z87891 Personal history of nicotine dependence: Secondary | ICD-10-CM | POA: Insufficient documentation

## 2016-04-26 DIAGNOSIS — D125 Benign neoplasm of sigmoid colon: Secondary | ICD-10-CM | POA: Diagnosis not present

## 2016-04-26 DIAGNOSIS — F329 Major depressive disorder, single episode, unspecified: Secondary | ICD-10-CM | POA: Diagnosis not present

## 2016-04-26 DIAGNOSIS — E119 Type 2 diabetes mellitus without complications: Secondary | ICD-10-CM | POA: Diagnosis not present

## 2016-04-26 DIAGNOSIS — K635 Polyp of colon: Secondary | ICD-10-CM | POA: Diagnosis not present

## 2016-04-26 DIAGNOSIS — I129 Hypertensive chronic kidney disease with stage 1 through stage 4 chronic kidney disease, or unspecified chronic kidney disease: Secondary | ICD-10-CM | POA: Diagnosis not present

## 2016-04-26 HISTORY — DX: Headache, unspecified: R51.9

## 2016-04-26 HISTORY — DX: Gastro-esophageal reflux disease without esophagitis: K21.9

## 2016-04-26 HISTORY — PX: COLONOSCOPY WITH PROPOFOL: SHX5780

## 2016-04-26 HISTORY — DX: Headache: R51

## 2016-04-26 HISTORY — DX: Type 2 diabetes mellitus without complications: E11.9

## 2016-04-26 HISTORY — DX: Postsurgical malabsorption, not elsewhere classified: K91.2

## 2016-04-26 LAB — GLUCOSE, CAPILLARY: GLUCOSE-CAPILLARY: 170 mg/dL — AB (ref 65–99)

## 2016-04-26 SURGERY — COLONOSCOPY WITH PROPOFOL
Anesthesia: General

## 2016-04-26 MED ORDER — FENTANYL CITRATE (PF) 100 MCG/2ML IJ SOLN
INTRAMUSCULAR | Status: DC | PRN
  Administered 2016-04-26 (×2): 50 ug via INTRAVENOUS

## 2016-04-26 MED ORDER — PROPOFOL 500 MG/50ML IV EMUL
INTRAVENOUS | Status: AC
  Filled 2016-04-26: qty 50

## 2016-04-26 MED ORDER — FENTANYL CITRATE (PF) 100 MCG/2ML IJ SOLN
INTRAMUSCULAR | Status: AC
  Filled 2016-04-26: qty 2

## 2016-04-26 MED ORDER — MIDAZOLAM HCL 2 MG/2ML IJ SOLN
INTRAMUSCULAR | Status: AC
  Filled 2016-04-26: qty 2

## 2016-04-26 MED ORDER — MIDAZOLAM HCL 2 MG/2ML IJ SOLN
INTRAMUSCULAR | Status: DC | PRN
  Administered 2016-04-26: 2 mg via INTRAVENOUS

## 2016-04-26 MED ORDER — SODIUM CHLORIDE 0.9 % IV SOLN
INTRAVENOUS | Status: DC
  Administered 2016-04-26 (×2): via INTRAVENOUS

## 2016-04-26 MED ORDER — PROPOFOL 500 MG/50ML IV EMUL
INTRAVENOUS | Status: DC | PRN
  Administered 2016-04-26: 120 ug/kg/min via INTRAVENOUS

## 2016-04-26 NOTE — Op Note (Signed)
[facility] Gastroenterology Patient Name: Robert Franklin Procedure Date: 04/26/2016 8:37 AM MRN: [id number] Account #: [id number] Date of Birth: 1965/07/14 Admit Type: Outpatient Age: 51 Room: [facility] ENDO ROOM 1 Gender: Male Note Status: Finalized Procedure:            Colonoscopy Indications:          Screening for colorectal malignant neoplasm Providers:            Jonathon Bellows MD, MD Referring MD:         Janine Ores. Rosanna Randy, MD (Referring MD) Medicines:            Monitored Anesthesia Care Complications:        No immediate complications. Procedure:            Pre-Anesthesia Assessment:                       - ASA Grade Assessment: III - A patient with severe                        systemic disease.                       After obtaining informed consent, the colonoscope was                        passed under direct vision. Throughout the procedure,                        the patient's blood pressure, pulse, and oxygen                        saturations were monitored continuously. The                        Colonoscope was introduced through the anus and                        advanced to the the ileocolonic anastomosis. The                        colonoscopy was performed with ease. The patient                        tolerated the procedure well. The quality of the bowel                        preparation was good. Findings:      The perianal and digital rectal examinations were normal. [Pertinent       Negatives].      A 5 mm polyp was found in the hepatic flexure. The polyp was sessile.       The polyp was removed with a cold biopsy forceps. Resection and       retrieval were complete.      A 5 mm polyp was found in the sigmoid colon. The polyp was sessile. The       polyp was removed with a cold biopsy forceps. Resection and retrieval       were complete.      Multiple small-mouthed diverticula were found in the sigmoid colon.      The exam was otherwise  normal throughout the examined  colon. Impression:           - One 5 mm polyp at the hepatic flexure, removed with a                        cold biopsy forceps. Resected and retrieved.                       - One 5 mm polyp in the sigmoid colon, removed with a                        cold biopsy forceps. Resected and retrieved.                       - Diverticulosis in the sigmoid colon. Recommendation:       - Discharge patient to home (with escort).                       - Resume previous diet.                       - Continue present medications.                       - Await pathology results.                       - Repeat colonoscopy in 5-10 years for surveillance                        based on pathology results. Procedure Code(s):    --- Professional ---                       [phone], Colonoscopy, flexible; with biopsy, single or                        multiple Diagnosis Code(s):    --- Professional ---                       Z12.11, Encounter for screening for malignant neoplasm                        of colon                       D12.3, Benign neoplasm of transverse colon (hepatic                        flexure or splenic flexure)                       D12.5, Benign neoplasm of sigmoid colon                       K57.30, Diverticulosis of large intestine without                        perforation or abscess without bleeding CPT copyright 2016 American Medical Association. All rights reserved. The codes documented in this report are preliminary and upon coder review may  be revised to meet current compliance requirements. Jonathon Bellows, MD Jonathon Bellows MD, MD 04/26/2016 9:14:11 AM This report has been  signed electronically. Number of Addenda: 0 Note Initiated On: 04/26/2016 8:37 AM Scope Withdrawal Time: 0 hours 22 minutes 1 second  Total Procedure Duration: 0 hours 24 minutes 24 seconds       [facility]

## 2016-04-26 NOTE — Anesthesia Post-op Follow-up Note (Cosign Needed)
Anesthesia QCDR form completed.        

## 2016-04-26 NOTE — Anesthesia Procedure Notes (Signed)
Performed by: COOK-MARTIN, Denali Becvar Pre-anesthesia Checklist: Patient identified, Emergency Drugs available, Suction available, Patient being monitored and Timeout performed Patient Re-evaluated:Patient Re-evaluated prior to inductionOxygen Delivery Method: Simple face mask Preoxygenation: Pre-oxygenation with 100% oxygen Intubation Type: IV induction Placement Confirmation: CO2 detector and positive ETCO2       

## 2016-04-26 NOTE — Anesthesia Preprocedure Evaluation (Signed)
Anesthesia Evaluation  Patient identified by MRN, date of birth, ID band Patient awake    Reviewed: Allergy & Precautions, NPO status , Patient's Chart, lab work & pertinent test results, reviewed documented beta blocker date and time   Airway Mallampati: III  TM Distance: >3 FB     Dental  (+) Chipped   Pulmonary sleep apnea , former smoker,           Cardiovascular hypertension, Pt. on medications and Pt. on home beta blockers + CAD       Neuro/Psych  Headaches, PSYCHIATRIC DISORDERS Anxiety Depression    GI/Hepatic PUD, GERD  ,  Endo/Other  diabetes, Type 2  Renal/GU Renal disease     Musculoskeletal   Abdominal   Peds  Hematology   Anesthesia Other Findings Uses bi-pap.  Reproductive/Obstetrics                             Anesthesia Physical Anesthesia Plan  ASA: III  Anesthesia Plan: General   Post-op Pain Management:    Induction: Intravenous  Airway Management Planned:   Additional Equipment:   Intra-op Plan:   Post-operative Plan:   Informed Consent: I have reviewed the patients History and Physical, chart, labs and discussed the procedure including the risks, benefits and alternatives for the proposed anesthesia with the patient or authorized representative who has indicated his/her understanding and acceptance.     Plan Discussed with: CRNA  Anesthesia Plan Comments:         Anesthesia Quick Evaluation

## 2016-04-26 NOTE — Transfer of Care (Signed)
Immediate Anesthesia Transfer of Care Note  Patient: Robert Franklin  Procedure(s) Performed: Procedure(s): COLONOSCOPY WITH PROPOFOL (N/A)  Patient Location: PACU  Anesthesia Type:General  Level of Consciousness: awake, alert  and oriented  Airway & Oxygen Therapy: Patient Spontanous Breathing and Patient connected to nasal cannula oxygen  Post-op Assessment: Report given to RN and Post -op Vital signs reviewed and stable  Post vital signs: Reviewed and stable  Last Vitals:  Vitals:   04/26/16 0804  BP: (!) 159/99  Pulse: 82  Resp: 18  Temp: 36.1 C    Last Pain:  Vitals:   04/26/16 0804  TempSrc: Oral         Complications: No apparent anesthesia complications

## 2016-04-26 NOTE — Anesthesia Postprocedure Evaluation (Signed)
Anesthesia Post Note  Patient: Robert Franklin  Procedure(Franklin) Performed: Procedure(Franklin) (LRB): COLONOSCOPY WITH PROPOFOL (N/A)  Patient location during evaluation: Endoscopy Anesthesia Type: General Level of consciousness: awake and alert Pain management: pain level controlled Vital Signs Assessment: post-procedure vital signs reviewed and stable Respiratory status: spontaneous breathing, nonlabored ventilation, respiratory function stable and patient connected to nasal cannula oxygen Cardiovascular status: blood pressure returned to baseline and stable Postop Assessment: no signs of nausea or vomiting Anesthetic complications: no     Last Vitals:  Vitals:   04/26/16 0925 04/26/16 0935  BP: (!) 133/95 (!) 140/103  Pulse: 79 73  Resp: 16 16  Temp:      Last Pain:  Vitals:   04/26/16 0804  TempSrc: Oral                 Robert Franklin

## 2016-04-26 NOTE — H&P (Signed)
Jonathon Bellows MD [address], [place], [place] [address] Phone: [phone] Fax : [phone]  Primary Care Physician:  [name], MD Primary Gastroenterologist:  Dr. Jonathon Bellows   Pre-Procedure History & Physical: HPI:  Robert Franklin is a 51 y.o. male is here for an colonoscopy.   Past Medical History:  Diagnosis Date  . Coronary artery disease, non-occlusive 02/28/2008   Dr. Humphrey Rolls @ [facility]: 40-50% LCx lesion by cath,  EF 60% with normal wall motion.  . Diabetes mellitus without complication ([place])   . Essential hypertension   . GERD (gastroesophageal reflux disease)   . Headache   . High triglycerides     was previously on treatment, but  he notes that this was stopped  . Kidney stones   . Moderate obesity 05/29/2014  . OSA treated with BiPAP   . Short bowel syndrome 1994    Past Surgical History:  Procedure Laterality Date  . "stomach wrap"  1991   GERD  . APPENDECTOMY    . CARDIAC CATHETERIZATION  02/2008   Dr. Humphrey Rolls @ [facility]: 40-50% LCx lesion by cath,  EF 60% with normal wall motion.  [name] Nuclear Stress Test   January 2010    heart rate increased from 80-158 BPM. No EKG changes.  there is a moderate sized  reversible defect in the inferior and inferior lateral wall as well as anteroseptal. EF 59%.  . CardioNet Holter Monitor   January 2010    sinus rhythm. Rare PVCs in setting was. Current PACs , seen in singles and pairs. Max heart rate 127, minimum heart rate 57. Average 84.  . CHOLECYSTECTOMY    . COLON SURGERY     due to gangrenous appendix  . HERNIA REPAIR  2010  . INNER EAR SURGERY    . TONSILLECTOMY    . TYMPANOSTOMY TUBE PLACEMENT Bilateral 1973    Prior to Admission medications   Medication Sig Start Date End Date Taking? Authorizing Provider  amoxicillin-clavulanate (AUGMENTIN) 875-125 MG tablet Take 1 tablet by mouth 2 (two) times daily. 03/28/16  Yes Richard Maceo Pro., MD  butalbital-acetaminophen-caffeine (FIORICET)  [phone] MG tablet Take 1 tablet by mouth every 6 (six) hours as needed for headache or migraine. 12/24/15  Yes Carmon Ginsberg, PA  cyclobenzaprine (FLEXERIL) 5 MG tablet Take 1 tablet (5 mg total) by mouth 3 (three) times daily as needed for muscle spasms. 07/10/15  Yes Carmon Ginsberg, PA  fenofibrate (TRICOR) 145 MG tablet TAKE 1 TABLET BY MOUTH DAILY AFTER SUPPER. 150MG  NO LONGER ON THE MARKET 11/27/15  Yes Leonie Man, MD  losartan-hydrochlorothiazide [facility]) 100-25 MG tablet TAKE 1 TABLET BY MOUTH DAILY 11/27/15  Yes [name]., MD  meclizine (ANTIVERT) 25 MG tablet Take 1 tablet (25 mg total) by mouth 3 (three) times daily as needed for dizziness. 05/14/15  Yes Richard Maceo Pro., MD  metFORMIN (GLUCOPHAGE) 500 MG tablet Take 1 tablet (500 mg total) by mouth daily with breakfast. 03/28/16  Yes [name]., MD  metoprolol succinate (TOPROL-XL) 100 MG 24 hr tablet TAKE 1 TABLET BY MOUTH EVERY DAY 06/25/15  Yes Richard Maceo Pro., MD  omega-3 acid ethyl esters (LOVAZA) 1 g capsule Take 1 capsule (1 g total) by mouth daily. 06/15/15  Yes Leonie Man, MD  omeprazole (PRILOSEC OTC) 20 MG tablet Take 1 tablet (20 mg total) by mouth daily. 05/14/15  Yes Richard Maceo Pro., MD  promethazine (PHENERGAN) 12.5  MG tablet Take 1 tablet (12.5 mg total) by mouth every 6 (six) hours as needed for nausea or vomiting. 01/22/16  Yes Trinna Post, PA-C  acetic acid-hydrocortisone (VOSOL-HC) otic solution Place 4 drops into the right ear 4 (four) times daily. Patient not taking: Reported on 04/26/2016 02/12/16   Birdie Sons, MD  diphenoxylate-atropine (LOMOTIL) 2.5-0.025 MG tablet Take 2 tablets by mouth 3 (three) times daily as needed for diarrhea or loose stools. Patient not taking: Reported on 04/26/2016 04/28/15   Birdie Sons, MD  fenofibrate 160 MG tablet  02/03/16   Historical Provider, MD  Icosapent Ethyl (VASCEPA) 1 g CAPS Take 2 capsules by mouth 2 (two) times daily.  12/10/15   Leonie Man, MD  meloxicam (MOBIC) 15 MG tablet Take 15 mg by mouth as needed.  02/26/14   Historical Provider, MD  tadalafil (CIALIS) 20 MG tablet Take 0.5 tablets (10 mg total) by mouth daily as needed for erectile dysfunction. 01/13/15   [name]., MD    Allergies as of 04/06/2016  . (No Known Allergies)    Family History  Problem Relation Age of Onset  . Hypertension Mother   . Irritable bowel syndrome Mother   . Migraines Mother   . Lumbar disc disease Mother   . Heart attack Mother   . Heart attack Maternal Uncle   . Heart attack Maternal Grandfather     Social History   Social History  . Marital status: Divorced    Spouse name: Collie Siad  . Number of children: 0  . Years of education: 14   Occupational History  . [employer]    Social History Main Topics  . Smoking status: Former Smoker    Packs/day: 1.00    Years: 28.00    Types: Cigarettes    Quit date: 12/24/1996  . Smokeless tobacco: Never Used  . Alcohol use Yes     Comment: seldom  . Drug use: No  . Sexual activity: Not Currently   Other Topics Concern  . 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    Review of Systems: See HPI, otherwise negative ROS  Physical Exam: BP (!) 159/99   Pulse 82   Temp 97 F (36.1 C) (Oral)   Resp 18   Ht 5\' 9"  (1.753 m)   Wt 250 lb (113.4 kg)   SpO2 98%   BMI 36.92 kg/m  General:   Alert,  pleasant and cooperative in NAD Head:  Normocephalic and atraumatic. Neck:  Supple; no masses or thyromegaly. Lungs:  Clear throughout to auscultation.    Heart:  Regular rate and rhythm. Abdomen:  Soft, nontender and nondistended. Normal bowel sounds, without guarding, and without rebound.   Neurologic:  Alert and  oriented x4;  grossly normal neurologically.  Impression/Plan: Robert Franklin is here for an colonoscopy to be  performed for Screening colonoscopy average risk    Risks, benefits, limitations, and alternatives regarding  colonoscopy have been reviewed with the patient.  Questions have been answered.  All parties agreeable.   Jonathon Bellows, MD  04/26/2016, 8:32 AM

## 2016-04-27 ENCOUNTER — Encounter: Payer: Self-pay | Admitting: Gastroenterology

## 2016-04-27 LAB — SURGICAL PATHOLOGY

## 2016-05-02 ENCOUNTER — Encounter: Payer: Self-pay | Admitting: Gastroenterology

## 2016-05-02 DIAGNOSIS — J342 Deviated nasal septum: Secondary | ICD-10-CM | POA: Diagnosis not present

## 2016-05-12 ENCOUNTER — Other Ambulatory Visit: Payer: Self-pay | Admitting: Family Medicine

## 2016-05-12 ENCOUNTER — Other Ambulatory Visit: Payer: Self-pay

## 2016-05-12 NOTE — Telephone Encounter (Signed)
[facility] following medication for 90-days supply.  Thanks CC   losartan-hydrochlorothiazide (HYZAAR) 100-25 MG tablet  Take 1 tablet by mouth daily.

## 2016-05-13 MED ORDER — LOSARTAN POTASSIUM-HCTZ 100-25 MG PO TABS: 1.0000 | ORAL_TABLET | Freq: Every day | ORAL | 2 refills | Status: DC

## 2016-05-13 NOTE — Telephone Encounter (Signed)
Done-aa 

## 2016-06-17 ENCOUNTER — Telehealth: Payer: Self-pay | Admitting: Family Medicine

## 2016-06-17 NOTE — Telephone Encounter (Signed)
ok 

## 2016-06-17 NOTE — Telephone Encounter (Signed)
Please review-aa 

## 2016-06-17 NOTE — Telephone Encounter (Signed)
Pt is leaving to go out of state 7/27 until 8/25.  He wants to know if his rx's can be filled to last him until he gets back in town.  He does not need a refill right now but will before he leaves.  Pt's call back is [phone]  [facility] [address]    Jeral Pinch

## 2016-06-29 DIAGNOSIS — H60333 Swimmer's ear, bilateral: Secondary | ICD-10-CM | POA: Diagnosis not present

## 2016-06-29 DIAGNOSIS — H6123 Impacted cerumen, bilateral: Secondary | ICD-10-CM | POA: Diagnosis not present

## 2016-06-30 ENCOUNTER — Other Ambulatory Visit: Payer: Self-pay | Admitting: Family Medicine

## 2016-07-06 ENCOUNTER — Encounter: Payer: Self-pay | Admitting: Family Medicine

## 2016-07-06 ENCOUNTER — Ambulatory Visit (INDEPENDENT_AMBULATORY_CARE_PROVIDER_SITE_OTHER): Payer: 59 | Admitting: Family Medicine

## 2016-07-06 VITALS — BP 122/72 | HR 80 | Temp 97.6°F | Resp 16 | Ht 69.0 in | Wt 250.0 lb

## 2016-07-06 DIAGNOSIS — E119 Type 2 diabetes mellitus without complications: Secondary | ICD-10-CM

## 2016-07-06 DIAGNOSIS — E781 Pure hyperglyceridemia: Secondary | ICD-10-CM

## 2016-07-06 DIAGNOSIS — M722 Plantar fascial fibromatosis: Secondary | ICD-10-CM

## 2016-07-06 DIAGNOSIS — I10 Essential (primary) hypertension: Secondary | ICD-10-CM | POA: Diagnosis not present

## 2016-07-06 DIAGNOSIS — G43809 Other migraine, not intractable, without status migrainosus: Secondary | ICD-10-CM

## 2016-07-06 DIAGNOSIS — R5383 Other fatigue: Secondary | ICD-10-CM

## 2016-07-06 LAB — POCT GLYCOSYLATED HEMOGLOBIN (HGB A1C)
ESTIMATED AVERAGE GLUCOSE: 131
Hemoglobin A1C: 6.2

## 2016-07-06 MED ORDER — NAPROXEN 500 MG PO TABS: 500.0000 mg | ORAL_TABLET | Freq: Two times a day (BID) | ORAL | 0 refills | Status: DC

## 2016-07-06 MED ORDER — BUTALBITAL-APAP-CAFFEINE 50-325-40 MG PO TABS: 1.0000 | ORAL_TABLET | Freq: Four times a day (QID) | ORAL | 1 refills | Status: DC | PRN

## 2016-07-06 NOTE — Progress Notes (Signed)
Patient: Robert Franklin Male    DOB: 05-27-1965   51 y.o.   MRN: [id number] Visit Date: 07/06/2016  Today's Provider: [name], MD   Chief Complaint  Patient presents with  . Diabetes  . Hypertension   Subjective:    HPI  Diabetes Mellitus Type II, Follow-up:   Lab Results  Component Value Date   HGBA1C 7.0 03/28/2016   HGBA1C 6.5 (H) 01/14/2015   HGBA1C 5.7 10/26/2011    Last seen for diabetes 3 months ago.  Management since then includes starting Metformin 500mg . He reports good compliance with treatment. He is not having side effects.  Current symptoms include none and have been stable. Home blood sugar records: stable  Episodes of hypoglycemia? no   Current Insulin Regimen: none Most Recent Eye Exam: up to date Weight trend: stable Prior visit with dietician: no Current diet: well balanced Current exercise: none  Pertinent Labs:    Component Value Date/Time   CHOL 207 (H) 03/28/2016 1516   TRIG 574 (HH) 03/28/2016 1516   HDL 25 (L) 03/28/2016 1516   LDLCALC Comment 03/28/2016 1516   CREATININE 0.95 03/28/2016 1516   CREATININE 1.01 01/29/2013 0111    Wt Readings from Last 3 Encounters:  07/06/16 250 lb (113.4 kg)  04/26/16 250 lb (113.4 kg)  03/28/16 256 lb (116.1 kg)      Hypertension, follow-up:  BP Readings from Last 3 Encounters:  07/06/16 122/72  04/26/16 (!) 140/103  03/28/16 118/88    He was last seen for hypertension 3 months ago.  BP at that visit was 118/88. Management since that visit includes no changes. He reports good compliance with treatment. He is not having side effects.  He is not exercising. He is adherent to low salt diet.   Outside blood pressures are not being checked. He is experiencing fatigue.  Patient denies dyspnea, lower extremity edema and palpitations.   Cardiovascular risk factors include diabetes mellitus.     Weight trend: stable Wt Readings from Last 3 Encounters:  07/06/16  250 lb (113.4 kg)  04/26/16 250 lb (113.4 kg)  03/28/16 256 lb (116.1 kg)    Current diet: well balanced   Patient also mentions that he feels very fatigued. He sleeps on average 8-10 hours a night, but he is still tired. He does have history of sleep apnea, and he wears a CPAP at night.       No Known Allergies   Current Outpatient Prescriptions:  .  acetic acid-hydrocortisone (VOSOL-HC) otic solution, Place 4 drops into the right ear 4 (four) times daily. (Patient not taking: Reported on 04/26/2016), Disp: 10 mL, Rfl: 1 .  amoxicillin-clavulanate (AUGMENTIN) 875-125 MG tablet, Take 1 tablet by mouth 2 (two) times daily., Disp: 20 tablet, Rfl: 0 .  butalbital-acetaminophen-caffeine (FIORICET) 50-325-40 MG tablet, Take 1 tablet by mouth every 6 (six) hours as needed for headache or migraine., Disp: 55 tablet, Rfl: 1 .  clarithromycin (BIAXIN) 500 MG tablet, TAKE 1 TABLET BY MOUTH TWICE A DAY FOR 2 WEEKS, Disp: , Rfl: 0 .  cyclobenzaprine (FLEXERIL) 5 MG tablet, Take 1 tablet (5 mg total) by mouth 3 (three) times daily as needed for muscle spasms., Disp: 30 tablet, Rfl: 0 .  diphenoxylate-atropine (LOMOTIL) 2.5-0.025 MG tablet, Take 2 tablets by mouth 3 (three) times daily as needed for diarrhea or loose stools. (Patient not taking: Reported on 04/26/2016), Disp: 20 tablet, Rfl: 0 .  fenofibrate (TRICOR) 145 MG tablet,  TAKE 1 TABLET BY MOUTH DAILY AFTER SUPPER. 150MG  NO LONGER ON THE MARKET, Disp: 30 tablet, Rfl: 8 .  fenofibrate 160 MG tablet, , Disp: , Rfl:  .  fluticasone (FLONASE) 50 MCG/ACT nasal spray, USE 2 SPRAYS IN EAQCH NOSTRIL ONCE AT NIGHT, Disp: , Rfl: 5 .  Icosapent Ethyl (VASCEPA) 1 g CAPS, Take 2 capsules by mouth 2 (two) times daily., Disp: 120 capsule, Rfl: O .  levofloxacin (LEVAQUIN) 500 MG tablet, TAKE 1 TABLET (500 MG TOTAL) BY MOUTH DAILY., Disp: , Rfl: 0 .  losartan-hydrochlorothiazide (HYZAAR) 100-25 MG tablet, Take 1 tablet by mouth daily., Disp: 90 tablet, Rfl: 2 .   meclizine (ANTIVERT) 25 MG tablet, Take 1 tablet (25 mg total) by mouth 3 (three) times daily as needed for dizziness., Disp: 30 tablet, Rfl: 0 .  meloxicam (MOBIC) 15 MG tablet, Take 15 mg by mouth as needed. , Disp: , Rfl:  .  metFORMIN (GLUCOPHAGE) 500 MG tablet, Take 1 tablet (500 mg total) by mouth daily with breakfast., Disp: 30 tablet, Rfl: 5 .  metoprolol succinate (TOPROL-XL) 100 MG 24 hr tablet, TAKE 1 TABLET BY MOUTH EVERY DAY, Disp: 30 tablet, Rfl: 11 .  mupirocin ointment (BACTROBAN) 2 %, APPLY TO NOSE TWICE DAILY AS NEEDED FOR SORE, Disp: , Rfl: 1 .  omega-3 acid ethyl esters (LOVAZA) 1 g capsule, Take 1 capsule (1 g total) by mouth daily., Disp: 30 capsule, Rfl: 6 .  omeprazole (PRILOSEC OTC) 20 MG tablet, Take 1 tablet (20 mg total) by mouth daily., Disp: 30 tablet, Rfl: 12 .  promethazine (PHENERGAN) 12.5 MG tablet, Take 1 tablet (12.5 mg total) by mouth every 6 (six) hours as needed for nausea or vomiting., Disp: 30 tablet, Rfl: 0 .  tadalafil (CIALIS) 20 MG tablet, Take 0.5 tablets (10 mg total) by mouth daily as needed for erectile dysfunction., Disp: 10 tablet, Rfl: 12  Review of Systems  Constitutional: Negative.   Respiratory: Negative.   Cardiovascular: Negative.   Neurological: Positive for dizziness and headaches.  Psychiatric/Behavioral: Negative.     Social History  Substance Use Topics  . Smoking status: Former Smoker    Packs/day: 1.00    Years: 28.00    Types: Cigarettes    Quit date: 12/24/1996  . Smokeless tobacco: Never Used  . Alcohol use Yes     Comment: seldom   Objective:   BP 122/72 (BP Location: Left Arm, Patient Position: Sitting, Cuff Size: Normal)   Pulse 80   Temp 97.6 F (36.4 C)   Resp 16   Ht 5\' 9"  (1.753 m)   Wt 250 lb (113.4 kg)   SpO2 99%   BMI 36.92 kg/m  Vitals:   07/06/16 1632  BP: 122/72  Pulse: 80  Resp: 16  Temp: 97.6 F (36.4 C)  SpO2: 99%  Weight: 250 lb (113.4 kg)  Height: 5\' 9"  (1.753 m)     Physical Exam    Constitutional: He is oriented to person, place, and time. He appears well-developed and well-nourished.  HENT:  Head: Normocephalic and atraumatic.  Right Ear: External ear normal.  Left Ear: External ear normal.  Nose: Nose normal.  Eyes: Conjunctivae are normal.  Neck: Neck supple.  Cardiovascular: Normal rate, regular rhythm and normal heart sounds.   Pulmonary/Chest: Effort normal and breath sounds normal.  Abdominal: Soft.  Musculoskeletal: Normal range of motion. He exhibits no edema.  Neurological: He is alert and oriented to person, place, and time.  Skin: Skin is  warm and dry.  Psychiatric: He has a normal mood and affect. His behavior is normal. Judgment and thought content normal.        Assessment & Plan:     1. Essential hypertension  - Comprehensive metabolic panel  2. Hypertriglyceridemia without hypercholesterolemia   3. Diabetes mellitus without complication ([place])  - POCT glycosylated hemoglobin (Hb A1C)--6.2 today.  4. Fatigue, unspecified type  - CBC with Differential/Platelet - Comprehensive metabolic panel - TSH - Testosterone  5. Other migraine without status migrainosus, not intractable  - butalbital-acetaminophen-caffeine (FIORICET) 50-325-40 MG tablet; Take 1 tablet by mouth every 6 (six) hours as needed for headache or migraine.  Dispense: 55 tablet; Refill: 1  6. Plantar fasciitis  - naproxen (NAPROSYN) 500 MG tablet; Take 1 tablet (500 mg total) by mouth 2 (two) times daily with a meal.  Dispense: 30 tablet; Refill: 0       Samary Shatz Cranford Mon, MD  [facility]

## 2016-07-15 ENCOUNTER — Other Ambulatory Visit: Payer: Self-pay | Admitting: Family Medicine

## 2016-07-15 DIAGNOSIS — N529 Male erectile dysfunction, unspecified: Secondary | ICD-10-CM

## 2016-07-20 ENCOUNTER — Telehealth: Payer: Self-pay | Admitting: Emergency Medicine

## 2016-07-20 NOTE — Telephone Encounter (Signed)
Not now.

## 2016-07-20 NOTE — Telephone Encounter (Signed)
I have a fax about this and [facility] in just a minute-aa

## 2016-07-20 NOTE — Telephone Encounter (Signed)
[facility] not being on statin therapy and would like to know if this is appropriate if so send in a new rx. Please advise. Thanks.    [facility]

## 2016-07-25 DIAGNOSIS — R5383 Other fatigue: Secondary | ICD-10-CM | POA: Diagnosis not present

## 2016-07-25 DIAGNOSIS — I10 Essential (primary) hypertension: Secondary | ICD-10-CM | POA: Diagnosis not present

## 2016-07-26 LAB — TSH: TSH: 2.26 u[IU]/mL (ref 0.450–4.500)

## 2016-07-26 LAB — CBC WITH DIFFERENTIAL/PLATELET
BASOS: 1 %
Basophils Absolute: 0.1 10*3/uL (ref 0.0–0.2)
EOS (ABSOLUTE): 0.2 10*3/uL (ref 0.0–0.4)
EOS: 2 %
HEMATOCRIT: 48.3 % (ref 37.5–51.0)
HEMOGLOBIN: 16.6 g/dL (ref 13.0–17.7)
IMMATURE GRANULOCYTES: 1 %
Immature Grans (Abs): 0 10*3/uL (ref 0.0–0.1)
LYMPHS ABS: 2.1 10*3/uL (ref 0.7–3.1)
Lymphs: 29 %
MCH: 30.1 pg (ref 26.6–33.0)
MCHC: 34.4 g/dL (ref 31.5–35.7)
MCV: 88 fL (ref 79–97)
MONOCYTES: 10 %
MONOS ABS: 0.7 10*3/uL (ref 0.1–0.9)
Neutrophils Absolute: 4.2 10*3/uL (ref 1.4–7.0)
Neutrophils: 57 %
Platelets: 188 10*3/uL (ref 150–379)
RBC: 5.52 x10E6/uL (ref 4.14–5.80)
RDW: 13.7 % (ref 12.3–15.4)
WBC: 7.4 10*3/uL (ref 3.4–10.8)

## 2016-07-26 LAB — COMPREHENSIVE METABOLIC PANEL
A/G RATIO: 1.6 (ref 1.2–2.2)
ALBUMIN: 4.6 g/dL (ref 3.5–5.5)
ALT: 33 IU/L (ref 0–44)
AST: 31 IU/L (ref 0–40)
Alkaline Phosphatase: 96 IU/L (ref 39–117)
BUN / CREAT RATIO: 17 (ref 9–20)
BUN: 18 mg/dL (ref 6–24)
Bilirubin Total: 0.5 mg/dL (ref 0.0–1.2)
CALCIUM: 9.9 mg/dL (ref 8.7–10.2)
CO2: 21 mmol/L (ref 20–29)
CREATININE: 1.07 mg/dL (ref 0.76–1.27)
Chloride: 98 mmol/L (ref 96–106)
GFR calc non Af Amer: 81 mL/min/{1.73_m2} (ref >59)
GFR, EST AFRICAN AMERICAN: 93 mL/min/{1.73_m2} (ref >59)
GLOBULIN, TOTAL: 2.8 g/dL (ref 1.5–4.5)
Glucose: 181 mg/dL — ABNORMAL HIGH (ref 65–99)
POTASSIUM: 4.4 mmol/L (ref 3.5–5.2)
SODIUM: 139 mmol/L (ref 134–144)
Total Protein: 7.4 g/dL (ref 6.0–8.5)

## 2016-07-26 LAB — TESTOSTERONE: TESTOSTERONE: 361 ng/dL (ref 264–916)

## 2016-07-29 DIAGNOSIS — H60333 Swimmer's ear, bilateral: Secondary | ICD-10-CM | POA: Diagnosis not present

## 2016-08-26 ENCOUNTER — Other Ambulatory Visit: Payer: Self-pay | Admitting: Cardiology

## 2016-09-11 ENCOUNTER — Other Ambulatory Visit: Payer: Self-pay | Admitting: Family Medicine

## 2016-10-11 ENCOUNTER — Ambulatory Visit (INDEPENDENT_AMBULATORY_CARE_PROVIDER_SITE_OTHER): Payer: 59 | Admitting: Family Medicine

## 2016-10-11 VITALS — BP 138/74 | HR 66 | Resp 16 | Wt 254.0 lb

## 2016-10-11 DIAGNOSIS — Z3169 Encounter for other general counseling and advice on procreation: Secondary | ICD-10-CM

## 2016-10-11 DIAGNOSIS — E119 Type 2 diabetes mellitus without complications: Secondary | ICD-10-CM | POA: Diagnosis not present

## 2016-10-11 DIAGNOSIS — K219 Gastro-esophageal reflux disease without esophagitis: Secondary | ICD-10-CM | POA: Diagnosis not present

## 2016-10-11 DIAGNOSIS — R51 Headache: Secondary | ICD-10-CM

## 2016-10-11 DIAGNOSIS — R519 Headache, unspecified: Secondary | ICD-10-CM

## 2016-10-11 DIAGNOSIS — R0789 Other chest pain: Secondary | ICD-10-CM | POA: Diagnosis not present

## 2016-10-11 LAB — POCT GLYCOSYLATED HEMOGLOBIN (HGB A1C)

## 2016-10-11 NOTE — Patient Instructions (Addendum)
Start taking Omeprazole daily.  Use heat, ice and Ibuprofen for headaches.  Work on diet and exercise for diabetes.  Referral to Urology made.

## 2016-10-11 NOTE — Progress Notes (Signed)
Robert Franklin  MRN: [id number] DOB: 23-Jul-1965  Subjective:  HPI   The patient is a 51 year old male who presents for follow up of his diabetes.  His last visit was on 07/06/16.  At that time his A1C was 6.2.  No changes were made with any of his medications and his labs were obtained at that visit.  He has not been checking his glucose regularly.  He is due for a diabetic foot and eye exam.  he went to the [place] and married a woman. He is working on getting her back [place] the states. The patient is now 51 years old and is due for a colon cancer screening.  Patient Active Problem List   Diagnosis Date Noted  . Medication management 01/01/2016  . Hypertriglyceridemia without hypercholesterolemia 02/26/2015  . Abnormally low high density lipoprotein (HDL) cholesterol with hypertriglyceridemia 02/26/2015  . Atrial premature contractions 02/26/2015  . Metabolic syndrome 16/60/6301  . Rapid heart beat 02/26/2015  . Signs and symptoms involving emotional state 05/29/2014  . Anxiety 05/29/2014  . Adult BMI 30+ 05/29/2014  . Clinical depression 05/29/2014  . Failure of erection 05/29/2014  . Abnormal LFTs 05/29/2014  . Blood glucose elevated 05/29/2014  . GERD (gastroesophageal reflux disease) 05/29/2014  . Combined fat and carbohydrate induced hyperlipemia 05/29/2014  . Essential hypertension 05/29/2014  . Headache, migraine 05/29/2014  . Obesity, Class II, BMI 35-39.9 05/29/2014  . Gastroduodenal ulcer 05/29/2014  . Apnea, sleep 05/29/2014  . Umbilical hernia without obstruction and without gangrene 05/29/2014  . Avitaminosis D 09/14/2009  . Arteriosclerosis of coronary artery 02/27/2009    Past Medical History:  Diagnosis Date  . Coronary artery disease, non-occlusive 02/28/2008   Dr. Humphrey Rolls @ [facility]: 40-50% LCx lesion by cath,  EF 60% with normal wall motion.  . Diabetes mellitus without complication ([place])   . Essential hypertension   . GERD (gastroesophageal reflux  disease)   . Headache   . High triglycerides     was previously on treatment, but  he notes that this was stopped  . Kidney stones   . Moderate obesity 05/29/2014  . OSA treated with BiPAP   . Short bowel syndrome 1994    Social History   Social History  . Marital status: Divorced    Spouse name: Collie Siad  . Number of children: 0  . Years of education: 14   Occupational History  . [employer]    Social History Main Topics  . Smoking status: Former Smoker    Packs/day: 1.00    Years: 28.00    Types: Cigarettes    Quit date: 12/24/1996  . Smokeless tobacco: Never Used  . Alcohol use Yes     Comment: seldom  . Drug use: No  . Sexual activity: Not Currently   Other Topics Concern  . 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    Outpatient Encounter Prescriptions as of 10/11/2016  Medication Sig Note  . CIALIS 20 MG tablet TAKE 0.5 TABLETS (10 MG TOTAL) BY MOUTH DAILY AS NEEDED FOR ERECTILE DYSFUNCTION.   . cyclobenzaprine (FLEXERIL) 5 MG tablet Take 1 tablet (5 mg total) by mouth 3 (three) times daily as needed for muscle spasms.   . fenofibrate 160 MG tablet TAKE 1 TABLET BY MOUTH DAILY AFTER SUPPER. 145MG  NO LONGER  ON THE MARKET   . losartan-hydrochlorothiazide (HYZAAR) 100-25 MG tablet Take 1 tablet by mouth daily.   . meclizine (ANTIVERT) 25 MG tablet Take 1 tablet (25 mg total) by mouth 3 (three) times daily as needed for dizziness.   . metFORMIN (GLUCOPHAGE) 500 MG tablet TAKE 1 TABLET (500 MG TOTAL) BY MOUTH DAILY WITH BREAKFAST.   . metoprolol succinate (TOPROL-XL) 100 MG 24 hr tablet TAKE 1 TABLET BY MOUTH EVERY DAY   . omega-3 acid ethyl esters (LOVAZA) 1 g capsule Take 1 capsule (1 g total) by mouth daily.   Marland Kitchen omeprazole (PRILOSEC OTC) 20 MG tablet Take 1 tablet (20 mg total) by mouth daily.   . [DISCONTINUED] acetic acid-hydrocortisone (VOSOL-HC)  otic solution Place 4 drops into the right ear 4 (four) times daily. (Patient not taking: Reported on 04/26/2016)   . [DISCONTINUED] amoxicillin-clavulanate (AUGMENTIN) 875-125 MG tablet Take 1 tablet by mouth 2 (two) times daily.   . [DISCONTINUED] butalbital-acetaminophen-caffeine (FIORICET) 50-325-40 MG tablet Take 1 tablet by mouth every 6 (six) hours as needed for headache or migraine.   . [DISCONTINUED] clarithromycin (BIAXIN) 500 MG tablet TAKE 1 TABLET BY MOUTH TWICE A DAY FOR 2 WEEKS   . [DISCONTINUED] diphenoxylate-atropine (LOMOTIL) 2.5-0.025 MG tablet Take 2 tablets by mouth 3 (three) times daily as needed for diarrhea or loose stools. (Patient not taking: Reported on 04/26/2016)   . [DISCONTINUED] fluticasone (FLONASE) 50 MCG/ACT nasal spray USE 2 SPRAYS IN EAQCH NOSTRIL ONCE AT NIGHT   . [DISCONTINUED] Icosapent Ethyl (VASCEPA) 1 g CAPS Take 2 capsules by mouth 2 (two) times daily.   . [DISCONTINUED] levofloxacin (LEVAQUIN) 500 MG tablet TAKE 1 TABLET (500 MG TOTAL) BY MOUTH DAILY.   . [DISCONTINUED] meloxicam (MOBIC) 15 MG tablet Take 15 mg by mouth as needed.  05/29/2014: Received from: [facility]  . [DISCONTINUED] mupirocin ointment (BACTROBAN) 2 % APPLY TO NOSE TWICE DAILY AS NEEDED FOR SORE   . [DISCONTINUED] naproxen (NAPROSYN) 500 MG tablet Take 1 tablet (500 mg total) by mouth 2 (two) times daily with a meal.   . [DISCONTINUED] promethazine (PHENERGAN) 12.5 MG tablet Take 1 tablet (12.5 mg total) by mouth every 6 (six) hours as needed for nausea or vomiting.    No facility-administered encounter medications on file as of 10/11/2016.     No Known Allergies  Review of Systems  Constitutional: Negative for fever and malaise/fatigue.  Eyes: Positive for blurred vision (when having headache).  Respiratory: Positive for shortness of breath. Negative for cough and wheezing.   Cardiovascular: Positive for chest pain. Negative for palpitations, orthopnea, claudication  and leg swelling.       Chest pressure since being off of his Omeprazole  Neurological: Positive for dizziness and headaches (Has a history of headache and these are different). Negative for weakness.  Endo/Heme/Allergies: Negative.   Psychiatric/Behavioral: Negative.     Objective:  BP 138/74 (BP Location: Right Arm, Patient Position: Sitting, Cuff Size: Normal)   Pulse 66   Resp 16   Wt 254 lb (115.2 kg)   BMI 37.51 kg/m   Physical Exam  Constitutional: He is oriented to person, place, and time and well-developed, well-nourished, and in no distress.  HENT:  Head: Normocephalic and atraumatic.  Right Ear: External ear normal.  Left Ear: External ear normal.  Nose: Nose normal.  Eyes: Conjunctivae are normal. No scleral icterus.  Neck: No thyromegaly present.  Cardiovascular: Normal rate, regular rhythm and normal heart sounds.   Pulmonary/Chest: Effort normal  and breath sounds normal.  Abdominal: Soft.  Neurological: He is alert and oriented to person, place, and time.  Skin: Skin is warm and dry.  Psychiatric: Mood, memory, affect and judgment normal.    Assessment and Plan :   1. Diabetes mellitus without complication (HCC) F1Q 6.8 today.  Work on habits.    - POCT glycosylated hemoglobin (Hb A1C)  2. Chest discomfort NSR on EKG.  Probable reflux.  Will have patient take Omeprazole regularly. - EKG 12-Lead  3. Gastroesophageal reflux disease, esophagitis presence not specified As above.  4. Infertility counseling Patient recently married and wife is 5.  He thinks his sperm count is low and would like to have infertility work up due to his wife's age. I informed patient that this is not appropriate timing. He needs to try to get pregnant with his wife for at least a year. He wishes to proceed at this time because of his wife's age. I will make the referral but I do not think it is appropriate.  - Ambulatory referral to Urology  5. New onset of headaches Patient  has history of migraine headaches and is describing new/different symptoms.  Will treat conservatively for now with heat, ice and Ibuprofen.    HPI, Exam and A&P Transcribed under the direction and in the presence of Miguel Aschoff, Brooke Bonito., MD. Electronically Signed: Althea Charon, RMA I have done the exam and reviewed the chart and it is accurate to the best of my knowledge. Development worker, community has been used and  any errors in dictation or transcription are unintentional. Miguel Aschoff M.D. [facility]

## 2016-10-23 ENCOUNTER — Other Ambulatory Visit: Payer: Self-pay | Admitting: Cardiology

## 2016-10-26 DIAGNOSIS — M9902 Segmental and somatic dysfunction of thoracic region: Secondary | ICD-10-CM | POA: Diagnosis not present

## 2016-10-26 DIAGNOSIS — M9901 Segmental and somatic dysfunction of cervical region: Secondary | ICD-10-CM | POA: Diagnosis not present

## 2016-10-26 DIAGNOSIS — G43009 Migraine without aura, not intractable, without status migrainosus: Secondary | ICD-10-CM | POA: Diagnosis not present

## 2016-10-27 DIAGNOSIS — M9901 Segmental and somatic dysfunction of cervical region: Secondary | ICD-10-CM | POA: Diagnosis not present

## 2016-10-27 DIAGNOSIS — G43009 Migraine without aura, not intractable, without status migrainosus: Secondary | ICD-10-CM | POA: Diagnosis not present

## 2016-10-27 DIAGNOSIS — M9902 Segmental and somatic dysfunction of thoracic region: Secondary | ICD-10-CM | POA: Diagnosis not present

## 2016-10-31 DIAGNOSIS — G43009 Migraine without aura, not intractable, without status migrainosus: Secondary | ICD-10-CM | POA: Diagnosis not present

## 2016-10-31 DIAGNOSIS — M9901 Segmental and somatic dysfunction of cervical region: Secondary | ICD-10-CM | POA: Diagnosis not present

## 2016-10-31 DIAGNOSIS — M9902 Segmental and somatic dysfunction of thoracic region: Secondary | ICD-10-CM | POA: Diagnosis not present

## 2016-11-04 DIAGNOSIS — M9902 Segmental and somatic dysfunction of thoracic region: Secondary | ICD-10-CM | POA: Diagnosis not present

## 2016-11-04 DIAGNOSIS — M9901 Segmental and somatic dysfunction of cervical region: Secondary | ICD-10-CM | POA: Diagnosis not present

## 2016-11-04 DIAGNOSIS — G43009 Migraine without aura, not intractable, without status migrainosus: Secondary | ICD-10-CM | POA: Diagnosis not present

## 2016-11-07 DIAGNOSIS — G43009 Migraine without aura, not intractable, without status migrainosus: Secondary | ICD-10-CM | POA: Diagnosis not present

## 2016-11-07 DIAGNOSIS — M9901 Segmental and somatic dysfunction of cervical region: Secondary | ICD-10-CM | POA: Diagnosis not present

## 2016-11-07 DIAGNOSIS — M9902 Segmental and somatic dysfunction of thoracic region: Secondary | ICD-10-CM | POA: Diagnosis not present

## 2016-11-10 DIAGNOSIS — M9901 Segmental and somatic dysfunction of cervical region: Secondary | ICD-10-CM | POA: Diagnosis not present

## 2016-11-10 DIAGNOSIS — G43009 Migraine without aura, not intractable, without status migrainosus: Secondary | ICD-10-CM | POA: Diagnosis not present

## 2016-11-10 DIAGNOSIS — M9902 Segmental and somatic dysfunction of thoracic region: Secondary | ICD-10-CM | POA: Diagnosis not present

## 2016-11-15 DIAGNOSIS — M9902 Segmental and somatic dysfunction of thoracic region: Secondary | ICD-10-CM | POA: Diagnosis not present

## 2016-11-15 DIAGNOSIS — M9901 Segmental and somatic dysfunction of cervical region: Secondary | ICD-10-CM | POA: Diagnosis not present

## 2016-11-15 DIAGNOSIS — G43009 Migraine without aura, not intractable, without status migrainosus: Secondary | ICD-10-CM | POA: Diagnosis not present

## 2016-11-17 DIAGNOSIS — M9901 Segmental and somatic dysfunction of cervical region: Secondary | ICD-10-CM | POA: Diagnosis not present

## 2016-11-17 DIAGNOSIS — M9902 Segmental and somatic dysfunction of thoracic region: Secondary | ICD-10-CM | POA: Diagnosis not present

## 2016-11-17 DIAGNOSIS — G43009 Migraine without aura, not intractable, without status migrainosus: Secondary | ICD-10-CM | POA: Diagnosis not present

## 2016-11-24 DIAGNOSIS — M9901 Segmental and somatic dysfunction of cervical region: Secondary | ICD-10-CM | POA: Diagnosis not present

## 2016-11-24 DIAGNOSIS — G43009 Migraine without aura, not intractable, without status migrainosus: Secondary | ICD-10-CM | POA: Diagnosis not present

## 2016-11-24 DIAGNOSIS — M9902 Segmental and somatic dysfunction of thoracic region: Secondary | ICD-10-CM | POA: Diagnosis not present

## 2016-12-08 DIAGNOSIS — G43009 Migraine without aura, not intractable, without status migrainosus: Secondary | ICD-10-CM | POA: Diagnosis not present

## 2016-12-08 DIAGNOSIS — M9901 Segmental and somatic dysfunction of cervical region: Secondary | ICD-10-CM | POA: Diagnosis not present

## 2016-12-08 DIAGNOSIS — M9902 Segmental and somatic dysfunction of thoracic region: Secondary | ICD-10-CM | POA: Diagnosis not present

## 2016-12-09 ENCOUNTER — Other Ambulatory Visit: Payer: Self-pay | Admitting: Cardiology

## 2017-01-04 ENCOUNTER — Other Ambulatory Visit: Payer: Self-pay | Admitting: *Deleted

## 2017-01-04 MED ORDER — FENOFIBRATE 160 MG PO TABS: 160.0000 mg | ORAL_TABLET | Freq: Every day | ORAL | 0 refills | Status: DC

## 2017-01-05 DIAGNOSIS — M9901 Segmental and somatic dysfunction of cervical region: Secondary | ICD-10-CM | POA: Diagnosis not present

## 2017-01-05 DIAGNOSIS — M9902 Segmental and somatic dysfunction of thoracic region: Secondary | ICD-10-CM | POA: Diagnosis not present

## 2017-01-05 DIAGNOSIS — G43009 Migraine without aura, not intractable, without status migrainosus: Secondary | ICD-10-CM | POA: Diagnosis not present

## 2017-02-02 DIAGNOSIS — M9901 Segmental and somatic dysfunction of cervical region: Secondary | ICD-10-CM | POA: Diagnosis not present

## 2017-02-02 DIAGNOSIS — M9903 Segmental and somatic dysfunction of lumbar region: Secondary | ICD-10-CM | POA: Diagnosis not present

## 2017-02-02 DIAGNOSIS — G43009 Migraine without aura, not intractable, without status migrainosus: Secondary | ICD-10-CM | POA: Diagnosis not present

## 2017-02-07 ENCOUNTER — Ambulatory Visit (INDEPENDENT_AMBULATORY_CARE_PROVIDER_SITE_OTHER): Payer: 59 | Admitting: Family Medicine

## 2017-02-07 VITALS — BP 128/62 | HR 68 | Temp 98.0°F | Resp 16 | Wt 248.4 lb

## 2017-02-07 DIAGNOSIS — E119 Type 2 diabetes mellitus without complications: Secondary | ICD-10-CM

## 2017-02-07 DIAGNOSIS — I2510 Atherosclerotic heart disease of native coronary artery without angina pectoris: Secondary | ICD-10-CM | POA: Diagnosis not present

## 2017-02-07 DIAGNOSIS — E781 Pure hyperglyceridemia: Secondary | ICD-10-CM

## 2017-02-07 DIAGNOSIS — K219 Gastro-esophageal reflux disease without esophagitis: Secondary | ICD-10-CM | POA: Diagnosis not present

## 2017-02-07 DIAGNOSIS — I10 Essential (primary) hypertension: Secondary | ICD-10-CM

## 2017-02-07 DIAGNOSIS — E669 Obesity, unspecified: Secondary | ICD-10-CM

## 2017-02-07 DIAGNOSIS — H6690 Otitis media, unspecified, unspecified ear: Secondary | ICD-10-CM | POA: Diagnosis not present

## 2017-02-07 LAB — POCT GLYCOSYLATED HEMOGLOBIN (HGB A1C): HEMOGLOBIN A1C: 7.5

## 2017-02-07 MED ORDER — AMOXICILLIN-POT CLAVULANATE 875-125 MG PO TABS: 1.0000 | ORAL_TABLET | Freq: Two times a day (BID) | ORAL | 0 refills | Status: DC

## 2017-02-07 NOTE — Progress Notes (Signed)
Robert Franklin  MRN: [id number] DOB: 08-19-1965  Subjective:  HPI  Patient is here for 4 months follow up. Last office visit was on 10/11/16. DM: patient is taking metformin. Patient is checking his sugar at random times. The highest reading he has gotten was 220 and lowest 142. No hypoglycemic episodes. Has some numbness in feet at times. Lab Results  Component Value Date   HGBA1C 6.65f 10/11/2016   Wt Readings from Last 3 Encounters:  02/07/17 248 lb 6.4 oz (112.7 kg)  10/11/16 254 lb (115.2 kg)  07/06/16 250 lb (113.4 kg)   Patient is also not feeling well and it all began Saturday 02/04/17. Symptoms are runny nose, nasal pain around sinuses, left ear pain, chills, joint ache but no fever, scratchy throat, head congestion, nausea, chest pressure/congestion. Patient has been taking OTC NyQuil, Theraflu and type of Alkelsetzer for his symptoms.  Patient Active Problem List   Diagnosis Date Noted  . Medication management 01/01/2016  . Hypertriglyceridemia without hypercholesterolemia 02/26/2015  . Abnormally low high density lipoprotein (HDL) cholesterol with hypertriglyceridemia 02/26/2015  . Atrial premature contractions 02/26/2015  . Metabolic syndrome 70/62/3762  . Rapid heart beat 02/26/2015  . Signs and symptoms involving emotional state 05/29/2014  . Anxiety 05/29/2014  . Adult BMI 30+ 05/29/2014  . Clinical depression 05/29/2014  . Failure of erection 05/29/2014  . Abnormal LFTs 05/29/2014  . Blood glucose elevated 05/29/2014  . GERD (gastroesophageal reflux disease) 05/29/2014  . Combined fat and carbohydrate induced hyperlipemia 05/29/2014  . Essential hypertension 05/29/2014  . Headache, migraine 05/29/2014  . Obesity, Class II, BMI 35-39.9 05/29/2014  . Gastroduodenal ulcer 05/29/2014  . Apnea, sleep 05/29/2014  . Umbilical hernia without obstruction and without gangrene 05/29/2014  . Avitaminosis D 09/14/2009  . Arteriosclerosis of coronary artery  02/27/2009    Past Medical History:  Diagnosis Date  . Coronary artery disease, non-occlusive 02/28/2008   Dr. Humphrey Rolls @ [facility]: 40-50% LCx lesion by cath,  EF 60% with normal wall motion.  . Diabetes mellitus without complication ([place])   . Essential hypertension   . GERD (gastroesophageal reflux disease)   . Headache   . High triglycerides     was previously on treatment, but  he notes that this was stopped  . Kidney stones   . Moderate obesity 05/29/2014  . OSA treated with BiPAP   . Short bowel syndrome 1994    Social History   Socioeconomic History  . Marital status: Divorced    Spouse name: Collie Siad  . Number of children: 0  . Years of education: 27  . Highest education level: Not on file  Social Needs  . Financial resource strain: Not on file  . Food insecurity - worry: Not on file  . Food insecurity - inability: Not on file  . Transportation needs - medical: Not on file  . Transportation needs - non-medical: Not on file  Occupational History  . Occupation: [employer]ist  Tobacco Use  . Smoking status: Former Smoker    Packs/day: 1.00    Years: 28.00    Pack years: 28.00    Types: Cigarettes    Last attempt to quit: 12/24/1996    Years since quitting: 20.1  . Smokeless tobacco: Never Used  Substance and Sexual Activity  . Alcohol use: Yes    Comment: seldom  . Drug use: No  . Sexual activity: Not Currently  Other Topics Concern  . Not on file  Social History Narrative  He is essentially the primary caregiver for his mom -  Who had an MI following back surgery  In the   Spring of 2016.    Outpatient Encounter Medications as of 02/07/2017  Medication Sig  . butalbital-acetaminophen-caffeine (FIORICET, ESGIC) 50-325-40 MG tablet Take by mouth 2 (two) times daily as needed for headache.  Marland Kitchen CIALIS 20 MG tablet TAKE 0.5 TABLETS (10 MG TOTAL) BY MOUTH DAILY AS NEEDED FOR ERECTILE DYSFUNCTION.  . cyclobenzaprine (FLEXERIL) 5 MG tablet Take 1 tablet (5 mg total) by mouth 3  (three) times daily as needed for muscle spasms.  . fenofibrate 160 MG tablet Take 1 tablet (160 mg total) by mouth daily. NEED OV.  Marland Kitchen losartan-hydrochlorothiazide (HYZAAR) 100-25 MG tablet Take 1 tablet by mouth daily.  . metFORMIN (GLUCOPHAGE) 500 MG tablet TAKE 1 TABLET (500 MG TOTAL) BY MOUTH DAILY WITH BREAKFAST.  . metoprolol succinate (TOPROL-XL) 100 MG 24 hr tablet TAKE 1 TABLET BY MOUTH EVERY DAY  . omeprazole (PRILOSEC OTC) 20 MG tablet Take 1 tablet (20 mg total) by mouth daily.  . [DISCONTINUED] meclizine (ANTIVERT) 25 MG tablet Take 1 tablet (25 mg total) by mouth 3 (three) times daily as needed for dizziness.  . [DISCONTINUED] omega-3 acid ethyl esters (LOVAZA) 1 g capsule Take 1 capsule (1 g total) by mouth daily.   No facility-administered encounter medications on file as of 02/07/2017.     No Known Allergies  Review of Systems  Constitutional: Positive for chills and malaise/fatigue. Negative for fever.  HENT: Positive for congestion, ear pain, sinus pain and sore throat.   Respiratory: Positive for cough.   Cardiovascular: Positive for chest pain (tightness from congestion more).  Gastrointestinal: Negative.        Better on Omeprazole  Musculoskeletal: Positive for back pain, joint pain, myalgias and neck pain.  Neurological: Positive for tingling (and numbness in feet) and weakness.    Objective:  BP 128/62   Pulse 68   Temp 98 F (36.7 C)   Resp 16   Wt 248 lb 6.4 oz (112.7 kg)   SpO2 96%   BMI 36.68 kg/m   Physical Exam  Constitutional: He is oriented to person, place, and time and well-developed, well-nourished, and in no distress.  HENT:  Head: Normocephalic and atraumatic.  Eyes: Conjunctivae are normal. No scleral icterus.  Neck: No thyromegaly present.  Cardiovascular: Normal rate, regular rhythm and normal heart sounds.  Pulmonary/Chest: Effort normal and breath sounds normal.  Abdominal: Soft.  Lymphadenopathy:    He has no cervical adenopathy.   Neurological: He is alert and oriented to person, place, and time. Gait normal. GCS score is 15.  Skin: Skin is warm and dry.  Psychiatric: Mood, memory, affect and judgment normal.    Assessment and Plan :  1. Diabetes mellitus without complication ([place]) Worse. D dna E stressed. - POCT HgB A1C--7.5 today.  2. Gastroesophageal reflux disease, esophagitis presence not specified   3. Essential hypertension   4. Acute otitis media, unspecified otitis media type  - amoxicillin-clavulanate (AUGMENTIN) 875-125 MG tablet; Take 1 tablet by mouth 2 (two) times daily.  Dispense: 20 tablet; Refill: 0 5.URI 6.Obesity 7.CAD Medical management. 8.HLD Goal LDL less than 70.  I have done the exam and reviewed the chart and it is accurate to the best of my knowledge. Development worker, community has been used and  any errors in dictation or transcription are unintentional. Miguel Aschoff M.D. [facility]

## 2017-02-20 DIAGNOSIS — H60333 Swimmer's ear, bilateral: Secondary | ICD-10-CM | POA: Diagnosis not present

## 2017-03-09 DIAGNOSIS — M9901 Segmental and somatic dysfunction of cervical region: Secondary | ICD-10-CM | POA: Diagnosis not present

## 2017-03-09 DIAGNOSIS — M9903 Segmental and somatic dysfunction of lumbar region: Secondary | ICD-10-CM | POA: Diagnosis not present

## 2017-03-09 DIAGNOSIS — G43009 Migraine without aura, not intractable, without status migrainosus: Secondary | ICD-10-CM | POA: Diagnosis not present

## 2017-03-20 ENCOUNTER — Other Ambulatory Visit: Payer: Self-pay | Admitting: Family Medicine

## 2017-04-02 ENCOUNTER — Other Ambulatory Visit: Payer: Self-pay | Admitting: Cardiology

## 2017-05-31 ENCOUNTER — Encounter: Payer: Self-pay | Admitting: Family Medicine

## 2017-06-07 ENCOUNTER — Encounter: Payer: Self-pay | Admitting: Family Medicine

## 2017-06-07 ENCOUNTER — Ambulatory Visit (INDEPENDENT_AMBULATORY_CARE_PROVIDER_SITE_OTHER): Payer: 59 | Admitting: Family Medicine

## 2017-06-07 VITALS — BP 118/84 | HR 72 | Temp 98.4°F | Resp 16 | Wt 243.0 lb

## 2017-06-07 DIAGNOSIS — Z0000 Encounter for general adult medical examination without abnormal findings: Secondary | ICD-10-CM | POA: Diagnosis not present

## 2017-06-07 DIAGNOSIS — R739 Hyperglycemia, unspecified: Secondary | ICD-10-CM

## 2017-06-07 DIAGNOSIS — Z125 Encounter for screening for malignant neoplasm of prostate: Secondary | ICD-10-CM

## 2017-06-07 MED ORDER — BUTALBITAL-APAP-CAFFEINE 50-325-40 MG PO TABS: 1.0000 | ORAL_TABLET | Freq: Two times a day (BID) | ORAL | 4 refills | Status: DC | PRN

## 2017-06-07 NOTE — Progress Notes (Signed)
Patient: Robert Franklin, Male    DOB: 05/14/1965, 52 y.o.   MRN: [id number] Visit Date: 06/07/2017  Today's Provider: [name], MD   Chief Complaint  Patient presents with  . Annual Exam   Subjective:  Robert Franklin is a 52 y.o. male who presents today for health maintenance and complete physical. He feels well. He reports exercising none. He reports he is sleeping well.  04/26/16 Colonoscopy, Dr Desma Mcgregor of polyp during procedure;sessile polyp.  Path report; non specific inflammation of the lamina, propria, hyperplastic epithelial change and hemorrhage. Second biopsy; prominent lymphoid aggregate.  Review of Systems  Constitutional: Negative.   HENT: Negative.   Eyes: Negative.   Respiratory: Negative.   Cardiovascular: Negative.   Gastrointestinal: Negative.   Endocrine: Negative.   Genitourinary: Negative.   Musculoskeletal: Negative.   Skin: Negative.   Allergic/Immunologic: Negative.   Neurological: Negative.   Hematological: Negative.   Psychiatric/Behavioral: Negative.     Social History   Socioeconomic History  . Marital status: Divorced    Spouse name: Collie Siad  . Number of children: 0  . Years of education: 19  . Highest education level: Not on file  Occupational History  . 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 Financial resource strain: Not on file  . Food insecurity:    Worry: Not on file    Inability: Not on file  . Transportation needs:    Medical: Not on file    Non-medical: Not on file  Tobacco Use  . Smoking status: Former Smoker    Packs/day: 1.00    Years: 28.00    Pack years: 28.00    Types: Cigarettes    Last attempt to quit: 12/24/1996    Years since quitting: 20.4  . Smokeless tobacco: Never Used  Substance and Sexual Activity  . Alcohol use: Never    Frequency: Never    Comment: seldom  . Drug use: No  . Sexual activity: Not Currently  Lifestyle  . Physical activity:    Days Robert week: Not on file     Minutes Robert session: Not on file  . Stress: Not on file  Relationships  . Social connections:    Talks on phone: Not on file    Gets together: Not on file    Attends religious service: Not on file    Active member of club or organization: Not on file    Attends meetings of clubs or organizations: Not on file    Relationship status: Not on file  . Intimate partner violence:    Fear of current or ex partner: Not on file    Emotionally abused: Not on file    Physically abused: Not on file    Forced sexual activity: Not on file  Other Topics Concern  . 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    Patient Active Problem List   Diagnosis Date Noted  . Medication management 01/01/2016  . Hypertriglyceridemia without hypercholesterolemia 02/26/2015  . Abnormally low high density lipoprotein (HDL) cholesterol with hypertriglyceridemia 02/26/2015  . Atrial premature contractions 02/26/2015  . Metabolic syndrome 16/96/7893  . Rapid heart beat 02/26/2015  . Signs and symptoms involving emotional state 05/29/2014  . Anxiety 05/29/2014  . Adult BMI 30+ 05/29/2014  . Clinical depression 05/29/2014  . Failure of erection 05/29/2014  .  Abnormal LFTs 05/29/2014  . Blood glucose elevated 05/29/2014  . GERD (gastroesophageal reflux disease) 05/29/2014  . Combined fat and carbohydrate induced hyperlipemia 05/29/2014  . Essential hypertension 05/29/2014  . Headache, migraine 05/29/2014  . Obesity, Class II, BMI 35-39.9 05/29/2014  . Gastroduodenal ulcer 05/29/2014  . Apnea, sleep 05/29/2014  . Umbilical hernia without obstruction and without gangrene 05/29/2014  . Avitaminosis D 09/14/2009  . Arteriosclerosis of coronary artery 02/27/2009    Past Surgical History:  Procedure Laterality Date  . "stomach wrap"  1991   GERD  . APPENDECTOMY    . CARDIAC CATHETERIZATION   02/2008   Dr. Humphrey Rolls @ [facility]: 40-50% LCx lesion by cath,  EF 60% with normal wall motion.  [name] Nuclear Stress Test   January 2010    heart rate increased from 80-158 BPM. No EKG changes.  there is a moderate sized  reversible defect in the inferior and inferior lateral wall as well as anteroseptal. EF 59%.  . CardioNet Holter Monitor   January 2010    sinus rhythm. Rare PVCs in setting was. Current PACs , seen in singles and pairs. Max heart rate 127, minimum heart rate 57. Average 84.  . CHOLECYSTECTOMY    . COLON SURGERY     due to gangrenous appendix  . COLONOSCOPY WITH PROPOFOL N/A 04/26/2016   Procedure: COLONOSCOPY WITH PROPOFOL;  Surgeon: Jonathon Bellows, MD;  Location: [facility] ENDOSCOPY;  Service: Endoscopy;  Laterality: N/A;  . HERNIA REPAIR  2010  . INNER EAR SURGERY    . TONSILLECTOMY    . TYMPANOSTOMY TUBE PLACEMENT Bilateral 1973    His family history includes Heart attack in his maternal grandfather, maternal uncle, and mother; Hypertension in his mother; Irritable bowel syndrome in his mother; Lumbar disc disease in his mother; Migraines in his mother.     Outpatient Encounter Medications as of 06/07/2017  Medication Sig  . butalbital-acetaminophen-caffeine (FIORICET, ESGIC) 50-325-40 MG tablet Take by mouth 2 (two) times daily as needed for headache.  Marland Kitchen CIALIS 20 MG tablet TAKE 0.5 TABLETS (10 MG TOTAL) BY MOUTH DAILY AS NEEDED FOR ERECTILE DYSFUNCTION.  . fenofibrate 160 MG tablet TAKE 1 TABLET (160 MG TOTAL) BY MOUTH DAILY. NEED OV.  Marland Kitchen losartan-hydrochlorothiazide (HYZAAR) 100-25 MG tablet Take 1 tablet by mouth daily.  . metFORMIN (GLUCOPHAGE) 500 MG tablet TAKE 1 TABLET (500 MG TOTAL) BY MOUTH DAILY WITH BREAKFAST.  . metoprolol succinate (TOPROL-XL) 100 MG 24 hr tablet TAKE 1 TABLET BY MOUTH EVERY DAY  . omeprazole (PRILOSEC OTC) 20 MG tablet Take 1 tablet (20 mg total) by mouth daily.  . [DISCONTINUED] amoxicillin-clavulanate (AUGMENTIN) 875-125 MG tablet Take 1 tablet by  mouth 2 (two) times daily.  . [DISCONTINUED] cyclobenzaprine (FLEXERIL) 5 MG tablet Take 1 tablet (5 mg total) by mouth 3 (three) times daily as needed for muscle spasms.   No facility-administered encounter medications on file as of 06/07/2017.     Patient Care Team: [name]., MD as PCP - General (Family Medicine)      Objective:   Vitals:  Vitals:   06/07/17 1355  BP: 118/84  Pulse: 72  Resp: 16  Temp: 98.4 F (36.9 C)  TempSrc: Oral  Weight: 243 lb (110.2 kg)    Physical Exam  Constitutional: He is oriented to person, place, and time. He appears well-developed and well-nourished.  HENT:  Head: Normocephalic and atraumatic.  Right Ear: External ear normal.  Left Ear: External ear normal.  Nose: Nose normal.  Mouth/Throat: Oropharynx is clear and moist.  Right EAC with posterior canal purple scar tissue from ENT therapy last year.  Eyes: Pupils are equal, round, and reactive to light. Conjunctivae and EOM are normal.  Neck: Normal range of motion. Neck supple.  Cardiovascular: Regular rhythm, normal heart sounds and intact distal pulses.  Pulmonary/Chest: Effort normal and breath sounds normal.  Abdominal: Soft. Bowel sounds are normal.  Small umbilical hernia.  Genitourinary: Rectum normal, prostate normal and penis normal.  Musculoskeletal: Normal range of motion.  Neurological: He is alert and oriented to person, place, and time.  Skin: Skin is warm and dry.  Psychiatric: He has a normal mood and affect. His behavior is normal. Judgment and thought content normal.     Depression Screen PHQ 2/9 Scores 06/07/2017 03/28/2016 07/14/2014  PHQ - 2 Score 0 1 0  PHQ- 9 Score 0 9 -      Assessment & Plan:     Routine Health Maintenance and Physical Exam  Exercise Activities and Dietary recommendations Goals    None      Immunization History  Administered Date(s) Administered  . Tdap 01/28/2010    Health Maintenance  Topic Date Due  .  PNEUMOCOCCAL POLYSACCHARIDE VACCINE (1) 05/17/1968  . FOOT EXAM  05/17/1976  . OPHTHALMOLOGY EXAM  05/17/1976  . HIV Screening  05/17/1981  . INFLUENZA VACCINE  03/29/2019 (Originally 08/24/2017)  . HEMOGLOBIN A1C  08/07/2017  . TETANUS/TDAP  01/29/2020  . COLONOSCOPY  04/26/2021     Discussed health benefits of physical activity, and encouraged him to engage in regular exercise appropriate for his age and condition.    I have done the exam and reviewed the chart and it is accurate to the best of my knowledge. Development worker, community has been used and  any errors in dictation or transcription are unintentional. Miguel Aschoff M.D. [facility]

## 2017-06-08 ENCOUNTER — Telehealth: Payer: Self-pay

## 2017-06-08 LAB — LIPID PANEL WITH LDL/HDL RATIO
CHOLESTEROL TOTAL: 185 mg/dL (ref 100–199)
HDL: 27 mg/dL — ABNORMAL LOW (ref >39)
Triglycerides: 468 mg/dL — ABNORMAL HIGH (ref 0–149)

## 2017-06-08 LAB — CBC WITH DIFFERENTIAL/PLATELET
BASOS ABS: 0.1 10*3/uL (ref 0.0–0.2)
Basos: 1 %
EOS (ABSOLUTE): 0.1 10*3/uL (ref 0.0–0.4)
Eos: 2 %
HEMATOCRIT: 47 % (ref 37.5–51.0)
HEMOGLOBIN: 16.6 g/dL (ref 13.0–17.7)
Immature Grans (Abs): 0 10*3/uL (ref 0.0–0.1)
Immature Granulocytes: 1 %
LYMPHS ABS: 2 10*3/uL (ref 0.7–3.1)
Lymphs: 34 %
MCH: 30.3 pg (ref 26.6–33.0)
MCHC: 35.3 g/dL (ref 31.5–35.7)
MCV: 86 fL (ref 79–97)
MONOCYTES: 7 %
MONOS ABS: 0.5 10*3/uL (ref 0.1–0.9)
NEUTROS ABS: 3.4 10*3/uL (ref 1.4–7.0)
Neutrophils: 55 %
Platelets: 167 10*3/uL (ref 150–379)
RBC: 5.47 x10E6/uL (ref 4.14–5.80)
RDW: 13.5 % (ref 12.3–15.4)
WBC: 6.1 10*3/uL (ref 3.4–10.8)

## 2017-06-08 LAB — TSH: TSH: 1.57 u[IU]/mL (ref 0.450–4.500)

## 2017-06-08 LAB — COMPREHENSIVE METABOLIC PANEL
ALBUMIN: 4.6 g/dL (ref 3.5–5.5)
ALK PHOS: 76 IU/L (ref 39–117)
ALT: 60 IU/L — ABNORMAL HIGH (ref 0–44)
AST: 58 IU/L — ABNORMAL HIGH (ref 0–40)
Albumin/Globulin Ratio: 1.4 (ref 1.2–2.2)
BUN / CREAT RATIO: 17 (ref 9–20)
BUN: 14 mg/dL (ref 6–24)
Bilirubin Total: 0.9 mg/dL (ref 0.0–1.2)
CO2: 21 mmol/L (ref 20–29)
Calcium: 9.6 mg/dL (ref 8.7–10.2)
Chloride: 99 mmol/L (ref 96–106)
Creatinine, Ser: 0.84 mg/dL (ref 0.76–1.27)
GFR calc Af Amer: 117 mL/min/{1.73_m2} (ref >59)
GFR calc non Af Amer: 101 mL/min/{1.73_m2} (ref >59)
GLUCOSE: 171 mg/dL — AB (ref 65–99)
Globulin, Total: 3.2 g/dL (ref 1.5–4.5)
Potassium: 3.8 mmol/L (ref 3.5–5.2)
SODIUM: 138 mmol/L (ref 134–144)
Total Protein: 7.8 g/dL (ref 6.0–8.5)

## 2017-06-08 LAB — PSA: Prostate Specific Ag, Serum: 0.3 ng/mL (ref 0.0–4.0)

## 2017-06-08 LAB — HEMOGLOBIN A1C
ESTIMATED AVERAGE GLUCOSE: 209 mg/dL
Hgb A1c MFr Bld: 8.9 % — ABNORMAL HIGH (ref 4.8–5.6)

## 2017-06-08 MED ORDER — METFORMIN HCL 1000 MG PO TABS: 1000.0000 mg | ORAL_TABLET | Freq: Two times a day (BID) | ORAL | 3 refills | Status: DC

## 2017-06-08 NOTE — Telephone Encounter (Signed)
Left message to call back  

## 2017-06-08 NOTE — Telephone Encounter (Signed)
Advised patient of results. Medication was sent into the pharmacy.  

## 2017-06-08 NOTE — Telephone Encounter (Signed)
-----   Message from [name]., MD sent at 06/08/2017  2:32 PM EDT ----- Fatty liver and DM wo--continue D and E,would also increase metformin to 1000mg  BID.

## 2017-06-27 ENCOUNTER — Other Ambulatory Visit: Payer: Self-pay | Admitting: Family Medicine

## 2017-06-29 ENCOUNTER — Other Ambulatory Visit: Payer: Self-pay | Admitting: Family Medicine

## 2017-07-04 ENCOUNTER — Other Ambulatory Visit: Payer: Self-pay | Admitting: Cardiology

## 2017-07-14 ENCOUNTER — Telehealth: Payer: Self-pay | Admitting: Family Medicine

## 2017-07-14 NOTE — Telephone Encounter (Signed)
Pt called saying the pharmacy keeps calling telling him he is supposed to be taking 2000 mg of metformin a day.  He does not feel this is correct  [facility]  Please clarify  Pt's CB  [phone]  [facility]

## 2017-07-14 NOTE — Telephone Encounter (Signed)
We have on file patient is supposed to take Metformin 1000mg  twice daily. Is this the correct dose? Please advise. Thanks!

## 2017-07-17 NOTE — Telephone Encounter (Signed)
Advised patient as below.  

## 2017-07-17 NOTE — Telephone Encounter (Signed)
yes

## 2017-07-21 DIAGNOSIS — M25521 Pain in right elbow: Secondary | ICD-10-CM | POA: Diagnosis not present

## 2017-07-21 DIAGNOSIS — M7501 Adhesive capsulitis of right shoulder: Secondary | ICD-10-CM | POA: Diagnosis not present

## 2017-07-30 ENCOUNTER — Other Ambulatory Visit: Payer: Self-pay | Admitting: Family Medicine

## 2017-08-21 DIAGNOSIS — M7501 Adhesive capsulitis of right shoulder: Secondary | ICD-10-CM | POA: Diagnosis not present

## 2017-08-21 DIAGNOSIS — M24811 Other specific joint derangements of right shoulder, not elsewhere classified: Secondary | ICD-10-CM | POA: Diagnosis not present

## 2017-08-22 DIAGNOSIS — M7501 Adhesive capsulitis of right shoulder: Secondary | ICD-10-CM | POA: Diagnosis not present

## 2017-08-22 DIAGNOSIS — M24811 Other specific joint derangements of right shoulder, not elsewhere classified: Secondary | ICD-10-CM | POA: Diagnosis not present

## 2017-10-03 ENCOUNTER — Other Ambulatory Visit: Payer: Self-pay | Admitting: Cardiology

## 2017-10-04 DIAGNOSIS — M4722 Other spondylosis with radiculopathy, cervical region: Secondary | ICD-10-CM | POA: Diagnosis not present

## 2017-10-04 DIAGNOSIS — M7501 Adhesive capsulitis of right shoulder: Secondary | ICD-10-CM | POA: Diagnosis not present

## 2017-10-18 ENCOUNTER — Other Ambulatory Visit: Payer: Self-pay | Admitting: Cardiology

## 2017-10-18 NOTE — Telephone Encounter (Signed)
Rx request sent to pharmacy.  

## 2017-10-27 ENCOUNTER — Other Ambulatory Visit: Payer: Self-pay | Admitting: Family Medicine

## 2017-11-01 DIAGNOSIS — M7501 Adhesive capsulitis of right shoulder: Secondary | ICD-10-CM | POA: Diagnosis not present

## 2017-11-11 ENCOUNTER — Other Ambulatory Visit: Payer: Self-pay | Admitting: Cardiology

## 2017-12-04 ENCOUNTER — Ambulatory Visit: Payer: Self-pay | Admitting: Family Medicine

## 2017-12-04 ENCOUNTER — Encounter: Payer: Self-pay | Admitting: Family Medicine

## 2017-12-04 ENCOUNTER — Ambulatory Visit: Payer: 59 | Admitting: Family Medicine

## 2017-12-04 VITALS — BP 130/80 | HR 66 | Temp 98.0°F | Resp 16 | Wt 236.0 lb

## 2017-12-04 DIAGNOSIS — R197 Diarrhea, unspecified: Secondary | ICD-10-CM

## 2017-12-04 NOTE — Progress Notes (Signed)
  Subjective:     Patient ID: Robert Franklin, male   DOB: Sep 30, 1965, 52 y.o.   MRN: [id number] Chief Complaint  Patient presents with  . Abdominal Pain    Patient comes in office today with concerns of viral like symptoms for the past 5 days. Patient reports episodes of diarrhea, nause and dizziness. Patient states that on 12/01/17 he contacted "teledoctor" and states that he was prescriped a z-pack and Zofran but it has not helped with symptoms. Patient has tried otc Pepto and Imodium.     HPI States his bowels were moving per routine prior to onset of diarrhea. Reports his mother had diarrhea as well but recovered quickly. Reports diffuse cramping with watery stools. Both food and fluids trigger his sx. No vomiting or fever but has had nausea. Colonoscopy in 2018 with diverticulosis and polyps. No abx prior to onset of his sx. He has been on higher dose of metformin for several months.Accompanied by his wife today.  Review of Systems     Objective:   Physical Exam  Constitutional: He appears well-developed and well-nourished. No distress.  Abdominal: Bowel sounds are increased. There is tenderness (in each quadrant ). There is no guarding.       Assessment:    1. Diarrhea, unspecified type - Cdiff NAA+O+P+Stool Culture    Plan:    Work excuse provided for 11/11-11/15. Encouraged use of Gatorade. Further f/u pending lab results.

## 2017-12-04 NOTE — Patient Instructions (Addendum)
We will call you with the lab results. Keep up with fluids as you are doing. May wish to add Gatorade to help with fluids.

## 2017-12-07 ENCOUNTER — Telehealth: Payer: Self-pay

## 2017-12-07 ENCOUNTER — Telehealth: Payer: Self-pay | Admitting: Family Medicine

## 2017-12-07 NOTE — Telephone Encounter (Signed)
Patient advised.KW 

## 2017-12-07 NOTE — Telephone Encounter (Signed)
[id number] 

## 2017-12-07 NOTE — Telephone Encounter (Signed)
See telephone encounter. KW 

## 2017-12-07 NOTE — Telephone Encounter (Signed)
Pt needing a call back regarding his recent lab results.  Please advise.  Thanks, [employer]

## 2017-12-07 NOTE — Telephone Encounter (Signed)
-----   Message from Carmon Ginsberg, [place] sent at 12/07/2017  7:22 AM EST ----- Let him know we are slowly getting results back from her stool tests and negative so far.

## 2017-12-07 NOTE — Telephone Encounter (Signed)
Kat, I think you guys saw this pateint.

## 2017-12-08 LAB — CDIFF NAA+O+P+STOOL CULTURE
CDIFFPCR: NEGATIVE
E coli, Shiga toxin Assay: NEGATIVE

## 2017-12-11 ENCOUNTER — Telehealth: Payer: Self-pay

## 2017-12-11 NOTE — Telephone Encounter (Signed)
Patient was advised he states that he is 100% percent better and diarrhea has stopped, patient reports that he returned back to work today. KW

## 2017-12-11 NOTE — Telephone Encounter (Signed)
-----   Message from Carmon Ginsberg, [place] sent at 12/11/2017  7:20 AM EST ----- Stool tests are ok. Has your diarrhea improved?

## 2017-12-19 ENCOUNTER — Encounter: Payer: Self-pay | Admitting: Family Medicine

## 2017-12-19 ENCOUNTER — Ambulatory Visit: Payer: Commercial Managed Care - PPO | Admitting: Family Medicine

## 2017-12-19 VITALS — BP 122/82 | HR 74 | Temp 97.7°F | Resp 16 | Wt 244.0 lb

## 2017-12-19 DIAGNOSIS — I10 Essential (primary) hypertension: Secondary | ICD-10-CM | POA: Diagnosis not present

## 2017-12-19 DIAGNOSIS — G4733 Obstructive sleep apnea (adult) (pediatric): Secondary | ICD-10-CM | POA: Diagnosis not present

## 2017-12-19 DIAGNOSIS — E781 Pure hyperglyceridemia: Secondary | ICD-10-CM

## 2017-12-19 DIAGNOSIS — M545 Low back pain, unspecified: Secondary | ICD-10-CM

## 2017-12-19 DIAGNOSIS — E119 Type 2 diabetes mellitus without complications: Secondary | ICD-10-CM

## 2017-12-19 DIAGNOSIS — G8929 Other chronic pain: Secondary | ICD-10-CM

## 2017-12-19 LAB — POCT GLYCOSYLATED HEMOGLOBIN (HGB A1C)
Est. average glucose Bld gHb Est-mCnc: 140
Hemoglobin A1C: 6.5 % — AB (ref 4.0–5.6)

## 2017-12-19 NOTE — Patient Instructions (Signed)

## 2017-12-19 NOTE — Progress Notes (Signed)
Patient: Robert Franklin Male    DOB: 1965/07/10   52 y.o.   MRN: [id number] Visit Date: 12/19/2017  Today's Provider: [name], MD   Chief Complaint  Patient presents with  . Follow-up   Subjective:    HPI  Diabetes Mellitus Type II, Follow-up:   Lab Results  Component Value Date   HGBA1C 8.9 (H) 06/07/2017   HGBA1C 7.5 02/07/2017   HGBA1C 6.33f 10/11/2016    Last seen for diabetes 6 months ago.  Management since then includes increasing Metformin to 1000mg  twice daily. He reports good compliance with treatment. He is not having side effects.  Current symptoms include paresthesia of the feet and visual disturbances and have been stable. Home blood sugar records: fasting range: 130-200  Episodes of hypoglycemia? no   Current Insulin Regimen: none Most Recent Eye Exam: 3 years ago Weight trend: fluctuating a bit Prior visit with dietician: no Current diet: small portions Current exercise: none  Pertinent Labs:    Component Value Date/Time   CHOL 185 06/07/2017 1453   TRIG 468 (H) 06/07/2017 1453   HDL 27 (L) 06/07/2017 1453   LDLCALC Comment 06/07/2017 1453   CREATININE 0.84 06/07/2017 1453   CREATININE 1.01 01/29/2013 0111    Wt Readings from Last 3 Encounters:  12/19/17 244 lb (110.7 kg)  12/04/17 236 lb (107 kg)  06/07/17 243 lb (110.2 kg)   Patient has chronic low back pain.  There are no radicular symptoms.  It is upper lumbar area. this is minimal problem for him.  Recently married and is very happy in this endeavor. ------------------------------------------------------------------------     No Known Allergies   Current Outpatient Medications:  .  butalbital-acetaminophen-caffeine (FIORICET, ESGIC) 50-325-40 MG tablet, Take 1 tablet by mouth 2 (two) times daily as needed for headache., Disp: 30 tablet, Rfl: 4 .  CIALIS 20 MG tablet, TAKE 0.5 TABLETS (10 MG TOTAL) BY MOUTH DAILY AS NEEDED FOR ERECTILE DYSFUNCTION., Disp: 10  tablet, Rfl: 2 .  hydrochlorothiazide (HYDRODIURIL) 25 MG tablet, TAKE 1 TABLET BY MOUTH EVERY DAY WITH LOSARTAN, Disp: 90 tablet, Rfl: 3 .  losartan (COZAAR) 100 MG tablet, TAKE 1 TABLET BY MOUTH EVERY DAY. TAKE WITH HCTZ, Disp: 90 tablet, Rfl: 3 .  metFORMIN (GLUCOPHAGE) 1000 MG tablet, TAKE 1 TABLET (1,000 MG TOTAL) BY MOUTH 2 (TWO) TIMES DAILY WITH A MEAL., Disp: 180 tablet, Rfl: 1 .  metoprolol succinate (TOPROL-XL) 100 MG 24 hr tablet, TAKE 1 TABLET BY MOUTH EVERY DAY, Disp: 30 tablet, Rfl: 11 .  omeprazole (PRILOSEC OTC) 20 MG tablet, Take 1 tablet (20 mg total) by mouth daily., Disp: 30 tablet, Rfl: 12 .  fenofibrate 160 MG tablet, Take 1 tablet (160 mg total) by mouth daily. (Patient not taking: Reported on 12/19/2017), Disp: 30 tablet, Rfl: 0  Review of Systems  Constitutional: Negative for appetite change, chills and fever.  HENT: Negative.   Eyes: Positive for visual disturbance.  Respiratory: Negative for chest tightness, shortness of breath and wheezing.   Cardiovascular: Negative for chest pain and palpitations.  Gastrointestinal: Negative for abdominal pain, nausea and vomiting.  Endocrine: Negative.   Genitourinary: Negative.   Musculoskeletal: Positive for back pain.  Allergic/Immunologic: Negative.   Neurological: Positive for numbness.  Psychiatric/Behavioral: Negative.     Social History   Tobacco Use  . Smoking status: Former Smoker    Packs/day: 1.00    Years: 28.00    Pack years: 28.00  Types: Cigarettes    Last attempt to quit: 12/24/1996    Years since quitting: 21.0  . Smokeless tobacco: Never Used  Substance Use Topics  . Alcohol use: Never    Frequency: Never    Comment: seldom   Objective:   BP 122/82 (BP Location: Right Arm, Patient Position: Sitting, Cuff Size: Large)   Pulse 74   Temp 97.7 F (36.5 C) (Oral)   Resp 16   Wt 244 lb (110.7 kg)   SpO2 95% Comment: room air  BMI 36.03 kg/m  Vitals:   12/19/17 1620  BP: 122/82  Pulse: 74   Resp: 16  Temp: 97.7 F (36.5 C)  TempSrc: Oral  SpO2: 95%  Weight: 244 lb (110.7 kg)     Physical Exam  Constitutional: He is oriented to person, place, and time. He appears well-developed and well-nourished.  HENT:  Head: Normocephalic and atraumatic.  Right Ear: External ear normal.  Left Ear: External ear normal.  Nose: Nose normal.  Eyes: Conjunctivae are normal.  Neck: No thyromegaly present.  Cardiovascular: Normal rate, regular rhythm and normal heart sounds.  Pulmonary/Chest: Effort normal and breath sounds normal.  Abdominal: Soft.  Musculoskeletal: He exhibits no edema or tenderness.  Neurological: He is alert and oriented to person, place, and time.  Skin: Skin is warm and dry.  Psychiatric: He has a normal mood and affect. His behavior is normal. Judgment and thought content normal.        Assessment & Plan:      1. Diabetes mellitus without complication ([place])  - POCT HgB A1C--6.5  2. Essential hypertension   3. Obstructive sleep apnea syndrome   4. Hypertriglyceridemia without hypercholesterolemia  5.chronic Low Back Pain Discussed exercises/stretching--possible PT referral.     I have done the exam and reviewed the above chart and it is accurate to the best of my knowledge. Development worker, community has been used in this note in any air is in the dictation or transcription are unintentional.  [name], MD  [place]

## 2018-02-01 ENCOUNTER — Ambulatory Visit: Payer: Commercial Managed Care - PPO | Admitting: Cardiology

## 2018-02-01 ENCOUNTER — Encounter: Payer: Self-pay | Admitting: Cardiology

## 2018-02-01 VITALS — BP 142/92 | HR 78 | Ht 69.0 in | Wt 246.6 lb

## 2018-02-01 DIAGNOSIS — E781 Pure hyperglyceridemia: Secondary | ICD-10-CM

## 2018-02-01 DIAGNOSIS — R000 Tachycardia, unspecified: Secondary | ICD-10-CM

## 2018-02-01 DIAGNOSIS — I2510 Atherosclerotic heart disease of native coronary artery without angina pectoris: Secondary | ICD-10-CM | POA: Diagnosis not present

## 2018-02-01 DIAGNOSIS — I10 Essential (primary) hypertension: Secondary | ICD-10-CM

## 2018-02-01 DIAGNOSIS — E786 Lipoprotein deficiency: Secondary | ICD-10-CM

## 2018-02-01 DIAGNOSIS — E8881 Metabolic syndrome: Secondary | ICD-10-CM | POA: Diagnosis not present

## 2018-02-01 DIAGNOSIS — I491 Atrial premature depolarization: Secondary | ICD-10-CM | POA: Diagnosis not present

## 2018-02-01 DIAGNOSIS — E669 Obesity, unspecified: Secondary | ICD-10-CM

## 2018-02-01 LAB — COMPREHENSIVE METABOLIC PANEL
ALT: 57 IU/L — AB (ref 0–44)
AST: 51 IU/L — AB (ref 0–40)
Albumin/Globulin Ratio: 1.8 (ref 1.2–2.2)
Albumin: 4.8 g/dL (ref 3.5–5.5)
Alkaline Phosphatase: 88 IU/L (ref 39–117)
BILIRUBIN TOTAL: 0.9 mg/dL (ref 0.0–1.2)
BUN/Creatinine Ratio: 20 (ref 9–20)
BUN: 19 mg/dL (ref 6–24)
CHLORIDE: 97 mmol/L (ref 96–106)
CO2: 23 mmol/L (ref 20–29)
Calcium: 9.9 mg/dL (ref 8.7–10.2)
Creatinine, Ser: 0.97 mg/dL (ref 0.76–1.27)
GFR calc Af Amer: 104 mL/min/{1.73_m2} (ref >59)
GFR calc non Af Amer: 90 mL/min/{1.73_m2} (ref >59)
Globulin, Total: 2.7 g/dL (ref 1.5–4.5)
Glucose: 148 mg/dL — ABNORMAL HIGH (ref 65–99)
Potassium: 4.3 mmol/L (ref 3.5–5.2)
Sodium: 138 mmol/L (ref 134–144)
Total Protein: 7.5 g/dL (ref 6.0–8.5)

## 2018-02-01 LAB — LIPID PANEL
Chol/HDL Ratio: 6.5 ratio — ABNORMAL HIGH (ref 0.0–5.0)
Cholesterol, Total: 183 mg/dL (ref 100–199)
HDL: 28 mg/dL — AB (ref >39)
TRIGLYCERIDES: 507 mg/dL — AB (ref 0–149)

## 2018-02-01 MED ORDER — ICOSAPENT ETHYL 1 G PO CAPS: 2.0000 | ORAL_CAPSULE | Freq: Two times a day (BID) | ORAL | 11 refills | Status: DC

## 2018-02-01 NOTE — Patient Instructions (Signed)
Medication Instructions:   STOP FENOFIBRATE  START VASCEPA 2,000 GM  ( 2 CAPSULES OF 1,000 GM) TWICE A DAY  If you need a refill on your cardiac medications before your next appointment, please call your pharmacy.   Lab work:  LIPID CMP - DO ON A DAY WHEN YOU HAVE NOT HAD ANYTHING TO EAT OR DRINK  If you have labs (blood work) drawn today and your tests are completely normal, you will receive your results only by: Marland Kitchen MyChart Message (if you have MyChart) OR . A paper copy in the mail If you have any lab test that is abnormal or we need to change your treatment, we will call you to review the results.  Testing/Procedures: NOT NEEDED  Follow-Up: At [facility], you and your health needs are our priority.  As part of our continuing mission to provide you with exceptional heart care, we have created designated Provider Care Teams.  These Care Teams include your primary Cardiologist (physician) and Advanced Practice Providers (APPs -  Physician Assistants and Nurse Practitioners) who all work together to provide you with the care you need, when you need it. You will need a follow up appointment in 12 months JAN 2021.  Please call our office 2 months in advance to schedule this appointment.  You may see Glenetta Hew, MD or one of the following Advanced Practice Providers on your designated Care Team:   Rosaria Ferries, PA-C . Jory Sims, DNP, ANP  Any Other Special Instructions Will Be Listed Below (If Applicable).

## 2018-02-01 NOTE — Progress Notes (Signed)
PCP: [name]., MD  Clinic Note: Chief Complaint  Patient presents with  . Follow-up    Delayed, almost 2 years; cardiac risk factors noted    HPI: Robert Franklin is a 53 y.o. male with a history of nonocclusive CAD by cath back in 2010 also with OSA and morbid obesity no family history of CAD who returns today for delayed follow-up with complaints of dyspnea.    [name] was last seen in February 2017 as an initial visit transferring care from Dr. Humphrey Rolls.  He was noticing episodes of rapid heartbeats.  Recent Hospitalizations: None  Studies Personally Reviewed - (if available, images/films reviewed: From Epic Chart or Care Everywhere)  None  Interval History: [name] returns here today for delayed follow-up.  He he got little bit out of his routine and he gained quite a bit of weight always about 265 pounds but is now down to 246 with dietary adjustment and try to get back and exercise.  He is concerned because his diabetes got out of control with an A1c of go above 8.  Is now down to 6.5. He really does not have any cardiac symptoms per se, but has noted occasional palpitations and dizziness for little while because he ran out of his beta-blocker dose.  He is now back on it and feeling much better. He otherwise not noted any rapid irregular heartbeats or palpitations since being back on the beta-blocker. He had been doing really well with his CPAP, but has not been as "loyal to it as she had been in the past and has noted that he does have a lot of daytime fatigue if he does not use it.  Part of the issue is he was waking up a lot at night having to go to the bathroom and that was making it hard for him to put the CPAP off and on.  He is been having off and on recurrent migraine headaches. He has had some feet and hand tingling as well as tingling in his arms and face that have occurred at rest but not necessarily with exertion. Overall his heart feels  better if he is more active he feels like there is less palpitations and things go more smoothly.  He just has a little bit of shortness of breath when he exerts himself mostly because of deconditioning and weight gain.  He has not been exercising regularly because work is just made it difficult for him.  He is trying to adjust his diet, but is concerned because the "healthy foods he is eating or making his sugars go higher. He has varicose veins in his legs but says that the edema has been pretty stable.  No chest pain or shortness of breath with rest or exertion.  No PND, orthopnea or edema.  No syncope/near syncope, or TIA/amaurosis fugax symptoms. No claudication.  ROS: A comprehensive was performed.  Pertinent symptoms noted above. Review of Systems  Constitutional: Positive for malaise/fatigue (In general less energy.  Partly because he has been sedentary.) and weight loss (Notable weight loss, but says that it is more pronounced than our scales show.  He had gained his weight up to 265 pounds.).  HENT: Negative for congestion.   Respiratory: Positive for shortness of breath (With exertion due to obesity). Negative for cough and wheezing.   Cardiovascular: Positive for palpitations (Better since being back on beta-blocker).  Gastrointestinal: Positive for heartburn. Negative for abdominal pain, blood in stool, constipation  and melena.  Genitourinary: Negative for hematuria.  Musculoskeletal: Positive for back pain and joint pain.  Neurological: Positive for dizziness and headaches. Negative for focal weakness and weakness.       Noted in HPI  Psychiatric/Behavioral: Negative.   All other systems reviewed and are negative.  I have reviewed and (if needed) personally updated the patient's problem list, medications, allergies, past medical and surgical history, social and family history.   Past Medical History:  Diagnosis Date  . Coronary artery disease, non-occlusive 02/28/2008   Dr.  Humphrey Rolls @ [facility]: 40-50% LCx lesion by cath,  EF 60% with normal wall motion.  . Diabetes mellitus without complication ([place])   . Essential hypertension   . GERD (gastroesophageal reflux disease)   . Headache   . High triglycerides     was previously on treatment, but  he notes that this was stopped  . Kidney stones   . Moderate obesity 05/29/2014  . OSA treated with BiPAP   . Short bowel syndrome 1994    Past Surgical History:  Procedure Laterality Date  . "stomach wrap"  1991   GERD  . APPENDECTOMY    . CARDIAC CATHETERIZATION  02/2008   Dr. Humphrey Rolls @ [facility]: 40-50% LCx lesion by cath,  EF 60% with normal wall motion.  [name] Nuclear Stress Test   January 2010    heart rate increased from 80-158 BPM. No EKG changes.  there is a moderate sized  reversible defect in the inferior and inferior lateral wall as well as anteroseptal. EF 59%.  . CardioNet Holter Monitor   January 2010    sinus rhythm. Rare PVCs in setting was. Current PACs , seen in singles and pairs. Max heart rate 127, minimum heart rate 57. Average 84.  . CHOLECYSTECTOMY    . COLON SURGERY     due to gangrenous appendix  . COLONOSCOPY WITH PROPOFOL N/A 04/26/2016   Procedure: COLONOSCOPY WITH PROPOFOL;  Surgeon: Jonathon Bellows, MD;  Location: [facility] ENDOSCOPY;  Service: Endoscopy;  Laterality: N/A;  . HERNIA REPAIR  2010  . INNER EAR SURGERY    . TONSILLECTOMY    . TYMPANOSTOMY TUBE PLACEMENT Bilateral 1973    Current Meds  Medication Sig  . butalbital-acetaminophen-caffeine (FIORICET, ESGIC) 50-325-40 MG tablet Take 1 tablet by mouth 2 (two) times daily as needed for headache.  Marland Kitchen CIALIS 20 MG tablet TAKE 0.5 TABLETS (10 MG TOTAL) BY MOUTH DAILY AS NEEDED FOR ERECTILE DYSFUNCTION.  . hydrochlorothiazide (HYDRODIURIL) 25 MG tablet TAKE 1 TABLET BY MOUTH EVERY DAY WITH LOSARTAN  . losartan (COZAAR) 100 MG tablet TAKE 1 TABLET BY MOUTH EVERY DAY. TAKE WITH HCTZ  . metFORMIN (GLUCOPHAGE) 1000 MG tablet TAKE 1 TABLET (1,000 MG  TOTAL) BY MOUTH 2 (TWO) TIMES DAILY WITH A MEAL.  . metoprolol succinate (TOPROL-XL) 100 MG 24 hr tablet TAKE 1 TABLET BY MOUTH EVERY DAY  . omeprazole (PRILOSEC OTC) 20 MG tablet Take 1 tablet (20 mg total) by mouth daily.  . [DISCONTINUED] fenofibrate 160 MG tablet Take 1 tablet (160 mg total) by mouth daily.    No Known Allergies  Social History   Tobacco Use  . Smoking status: Former Smoker    Packs/day: 1.00    Years: 28.00    Pack years: 28.00    Types: Cigarettes    Last attempt to quit: 12/24/1996    Years since quitting: 21.1  . Smokeless tobacco: Never Used  Substance Use Topics  . Alcohol use: Never  Frequency: Never    Comment: seldom  . 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    family history includes Heart attack in his maternal grandfather, maternal uncle, and mother; Hypertension in his mother; Irritable bowel syndrome in his mother; Lumbar disc disease in his mother; Migraines in his mother.  Wt Readings from Last 3 Encounters:  02/01/18 246 lb 9.6 oz (111.9 kg)  12/19/17 244 lb (110.7 kg)  12/04/17 236 lb (107 kg)    PHYSICAL EXAM BP (!) 142/92   Pulse 78   Ht 5\' 9"  (1.753 m)   Wt 246 lb 9.6 oz (111.9 kg)   BMI 36.42 kg/m  Physical Exam  Constitutional: He is oriented to person, place, and time. He appears well-developed and well-nourished. No distress.  Does have notable weight loss, still moderately obese.  Well-groomed  HENT:  Head: Normocephalic and atraumatic.  Neck: Normal range of motion. Neck supple. No hepatojugular reflux and no JVD present. Carotid bruit is not present.  Cardiovascular: Normal rate, regular rhythm, S1 normal, S2 normal and normal pulses.  Occasional extrasystoles are present. PMI is not displaced (Difficult to palpate due to body habitus). Exam reveals distant heart sounds. Exam  reveals no gallop, no friction rub and no decreased pulses.  No murmur heard. Pulmonary/Chest: Effort normal and breath sounds normal. No respiratory distress. He has no wheezes. He has no rales.  Abdominal: Soft. Bowel sounds are normal. He exhibits no distension. There is no abdominal tenderness. There is no rebound.  Unable to assess HSM due to body habitus  Musculoskeletal: Normal range of motion.        General: No edema (Evidence of varicose veins noted bilaterally, but no significant edema.).  Lymphadenopathy:    He has no cervical adenopathy.  Neurological: He is alert and oriented to person, place, and time.  Psychiatric: He has a normal mood and affect. His behavior is normal. Judgment and thought content normal.  Vitals reviewed.   Adult ECG Report  Rate: 78 ;  Rhythm: normal sinus rhythm and Normal axis, intervals and durations.;   Narrative Interpretation: Normal EKG  Other studies Reviewed: Additional studies/ records that were reviewed today include:  Recent Labs:    K PN Jun 07, 2017: TC 185, TG 468, HDL 27.  Unable to calculate LDL.  A1c 6.5.  ASSESSMENT / PLAN: Problem List Items Addressed This Visit    Abnormally low high density lipoprotein (HDL) cholesterol with hypertriglyceridemia (Chronic)    Low HDL with high triglycerides.  As part of metabolic syndrome. No longer taking fenofibrate. We will start Vascepa 2 g daily.  Re-follow-up labs in roughly 3 months.  If still not at goal, will need CV RR/lipid clinic follow-up.       Relevant Medications   Icosapent Ethyl (VASCEPA) 1 g CAPS   Other Relevant Orders   Lipid panel (Completed)   Comprehensive metabolic panel (Completed)   Arteriosclerosis of coronary artery (Chronic)    Moderate nonobstructive disease by cath back in 2010.  Has hypertension and hyperlipidemia consistent with metabolic syndrome. Is now back on his metoprolol succinate and losartan.  Blood pressure is borderline, but has been much  better at home of late.  We will continue to monitor for now and then adjust accordingly.  Treating hyperlipidemia as noted.  Relevant Medications   Icosapent Ethyl (VASCEPA) 1 g CAPS   Other Relevant Orders   EKG 12-Lead (Completed)   Comprehensive metabolic panel (Completed)   Atrial premature contractions (Chronic)   Relevant Medications   Icosapent Ethyl (VASCEPA) 1 g CAPS   Other Relevant Orders   EKG 12-Lead (Completed)   Essential hypertension (Chronic)    Borderline blood pressure today.  He tells me at home is better than this.  We will continue to monitor.  If numbers tend to trend up, would need to add amlodipine versus change from losartan to a more potent antihypertensive agent.   He is already on max dose ARB and beta-blocker as well as diuretic.      Relevant Medications   Icosapent Ethyl (VASCEPA) 1 g CAPS   Hypertriglyceridemia without hypercholesterolemia - Primary (Chronic)    See explanation in problem list.  Adding 2 g Vascepa      Relevant Medications   Icosapent Ethyl (VASCEPA) 1 g CAPS   Other Relevant Orders   Lipid panel (Completed)   Comprehensive metabolic panel (Completed)   Metabolic syndrome (Chronic)   Relevant Orders   Lipid panel (Completed)   Obesity, Class II, BMI 35-39.9 (Chronic)    He has had a good job losing weight.  Hopefully continue to lose weight.      Rapid heart beat    He certainly noticed him being off a beta-blocker, but now that he is back on beta-blocker, no more symptoms.  We will refill Toprol 100 mg.        I spent a total of 35-40 minutes with the patient and chart review. >  50% of the time was spent in direct patient consultation.  Shadoe had lots of questions, had lots to talk about.  Most of what he was describing really was not cardiac in nature, very difficult to get him back on target.  I spent a long time listening to his multiple other noncardiac issues.  Current medicines are reviewed at length with  the patient today.  (+/- concerns) none The following changes have been made:  See below  Patient Instructions  Medication Instructions:   STOP FENOFIBRATE  START VASCEPA 2,000 GM  ( 2 CAPSULES OF 1,000 GM) TWICE A DAY  If you need a refill on your cardiac medications before your next appointment, please call your pharmacy.   Lab work:  LIPID CMP - DO ON A DAY WHEN YOU HAVE NOT HAD ANYTHING TO EAT OR DRINK  If you have labs (blood work) drawn today and your tests are completely normal, you will receive your results only by: Marland Kitchen MyChart Message (if you have MyChart) OR . A paper copy in the mail If you have any lab test that is abnormal or we need to change your treatment, we will call you to review the results.  Testing/Procedures: NOT NEEDED  Follow-Up: At [facility], you and your health needs are our priority.  As part of our continuing mission to provide you with exceptional heart care, we have created designated Provider Care Teams.  These Care Teams include your primary Cardiologist (physician) and Advanced Practice Providers (APPs -  Physician Assistants and Nurse Practitioners) who all work together to provide you with the care you need, when you need it. You will need a follow up appointment in 12 months JAN 2021.  Please call our office 2 months in advance to schedule this appointment.  You may see Glenetta Hew, MD or one of  the following Advanced Practice Providers on your designated Care Team:   Rosaria Ferries, PA-C . Jory Sims, DNP, ANP  Any Other Special Instructions Will Be Listed Below (If Applicable).     Studies Ordered:   Orders Placed This Encounter  Procedures  . Lipid panel  . Comprehensive metabolic panel  . EKG 12-Lead   Lab Results  Component Value Date   CHOL 183 02/01/2018   HDL 28 (L) 02/01/2018   LDLCALC Comment 02/01/2018   TRIG 507 (H) 02/01/2018   CHOLHDL 6.5 (H) 02/01/2018    Glenetta Hew, M.D., M.S. Interventional  Cardiologist   Pager # [phone] Phone # [phone] [address] [place], [place] [address]   Thank you for choosing Heartcare at [facility]!!

## 2018-02-04 ENCOUNTER — Encounter: Payer: Self-pay | Admitting: Cardiology

## 2018-02-04 NOTE — Assessment & Plan Note (Signed)
Borderline blood pressure today.  He tells me at home is better than this.  We will continue to monitor.  If numbers tend to trend up, would need to add amlodipine versus change from losartan to a more potent antihypertensive agent.   He is already on max dose ARB and beta-blocker as well as diuretic.

## 2018-02-04 NOTE — Assessment & Plan Note (Signed)
Low HDL with high triglycerides.  As part of metabolic syndrome. No longer taking fenofibrate. We will start Vascepa 2 g daily.  Re-follow-up labs in roughly 3 months.  If still not at goal, will need CV RR/lipid clinic follow-up.

## 2018-02-04 NOTE — Assessment & Plan Note (Addendum)
Moderate nonobstructive disease by cath back in 2010.  Has hypertension and hyperlipidemia consistent with metabolic syndrome. Is now back on his metoprolol succinate and losartan.  Blood pressure is borderline, but has been much better at home of late.  We will continue to monitor for now and then adjust accordingly.  Treating hyperlipidemia as noted.

## 2018-02-04 NOTE — Assessment & Plan Note (Signed)
See explanation in problem list.  Adding 2 g Vascepa

## 2018-02-04 NOTE — Assessment & Plan Note (Signed)
He certainly noticed him being off a beta-blocker, but now that he is back on beta-blocker, no more symptoms.  We will refill Toprol 100 mg.

## 2018-02-04 NOTE — Assessment & Plan Note (Signed)
He has had a good job losing weight.  Hopefully continue to lose weight.

## 2018-02-14 ENCOUNTER — Telehealth: Payer: Self-pay | Admitting: *Deleted

## 2018-02-14 DIAGNOSIS — R7989 Other specified abnormal findings of blood chemistry: Secondary | ICD-10-CM

## 2018-02-14 DIAGNOSIS — I2510 Atherosclerotic heart disease of native coronary artery without angina pectoris: Secondary | ICD-10-CM

## 2018-02-14 DIAGNOSIS — E669 Obesity, unspecified: Secondary | ICD-10-CM

## 2018-02-14 DIAGNOSIS — R945 Abnormal results of liver function studies: Secondary | ICD-10-CM

## 2018-02-14 DIAGNOSIS — E781 Pure hyperglyceridemia: Secondary | ICD-10-CM

## 2018-02-14 DIAGNOSIS — E786 Lipoprotein deficiency: Secondary | ICD-10-CM

## 2018-02-14 MED ORDER — FENOFIBRATE 160 MG PO TABS: 160.0000 mg | ORAL_TABLET | Freq: Every day | ORAL | 3 refills | Status: DC

## 2018-02-14 NOTE — Telephone Encounter (Signed)
PATIENT  AWARE OF RESULTS AND MEDICATION CHANGES AND LAB NEED IN 3 MONTHS liver , hepatic will be mailed   vascepa has been approved for prior authorization

## 2018-02-14 NOTE — Telephone Encounter (Signed)
ATTEMPTED TO  DUE COVER[employer]MED PRIOR AUTHORIZATION UNSUCCESSFUL  CALLED AND SPOKE  TO [employer]  PRIOR AUTHORIZATION  DX- HYPERTRIGLYCERIDEMIA.E78.1  [id number] 02/01/18  MEDICATION WAS APPROVED FOR 1 YEAR.  NOTIFIED PHARMACY AND PATIENT

## 2018-02-14 NOTE — Telephone Encounter (Signed)
-----   Message from Leonie Man, MD sent at 02/01/2018  6:05 PM EST ----- Triglycerides are still very HIGH.  Need to stay on Fenofibrate 160 mg & increase to 2 g Vascepa.  Recheck in 3 months -- may need to add statin  Glenetta Hew, MD

## 2018-02-17 ENCOUNTER — Other Ambulatory Visit: Payer: Self-pay | Admitting: Family Medicine

## 2018-02-17 DIAGNOSIS — N529 Male erectile dysfunction, unspecified: Secondary | ICD-10-CM

## 2018-02-26 ENCOUNTER — Encounter: Payer: Self-pay | Admitting: Family Medicine

## 2018-02-26 ENCOUNTER — Ambulatory Visit: Payer: Commercial Managed Care - PPO | Admitting: Family Medicine

## 2018-02-26 ENCOUNTER — Other Ambulatory Visit: Payer: Self-pay

## 2018-02-26 VITALS — BP 152/98 | HR 73 | Temp 98.5°F | Ht 69.0 in | Wt 238.2 lb

## 2018-02-26 DIAGNOSIS — J101 Influenza due to other identified influenza virus with other respiratory manifestations: Secondary | ICD-10-CM | POA: Diagnosis not present

## 2018-02-26 DIAGNOSIS — R6889 Other general symptoms and signs: Secondary | ICD-10-CM | POA: Diagnosis not present

## 2018-02-26 LAB — POCT INFLUENZA A/B
Influenza A, POC: POSITIVE — AB
Influenza B, POC: NEGATIVE

## 2018-02-26 MED ORDER — PROMETHAZINE-DM 6.25-15 MG/5ML PO SYRP: 5.0000 mL | ORAL_SOLUTION | Freq: Four times a day (QID) | ORAL | 0 refills | Status: DC | PRN

## 2018-02-26 NOTE — Progress Notes (Signed)
  Subjective:     Patient ID: Robert Franklin, male   DOB: July 22, 1965, 53 y.o.   MRN: [id number] Chief Complaint  Patient presents with  . chest congestion    tightness, dark cream color mucus, body aches, fever of 102.2 for 10 hrs, ear pain, jaws hurt, nausea.  since 02/24/18   HPI No flu shot this season  Review of Systems     Objective:   Physical Exam Constitutional:      General: He is not in acute distress.    Appearance: He is ill-appearing.  Neurological:     Mental Status: He is alert.   Ears: T.M's intact without inflammation Throat: tonsils absent Neck: no cervical adenopathy Lungs: clear     Assessment:    1. Influenza A - promethazine-dextromethorphan (PROMETHAZINE-DM) 6.25-15 MG/5ML syrup; Take 5 mLs by mouth 4 (four) times daily as needed for cough.  Dispense: 240 mL; Refill: 0  2. Flu-like symptoms - POCT Influenza A/B    Plan:   Work excuse for 2/3-03/02/2018. Discussed use or saline irrigation, expectorant, and Claritin or similar for drip

## 2018-02-26 NOTE — Patient Instructions (Signed)
Discussed use of Saline nasal irrigation, plain Robitussin for an expectorant and Claritin or similar for nasal drip.

## 2018-03-02 ENCOUNTER — Encounter: Payer: Self-pay | Admitting: Family Medicine

## 2018-03-02 ENCOUNTER — Other Ambulatory Visit: Payer: Self-pay

## 2018-03-02 ENCOUNTER — Ambulatory Visit (INDEPENDENT_AMBULATORY_CARE_PROVIDER_SITE_OTHER): Payer: Commercial Managed Care - PPO | Admitting: Family Medicine

## 2018-03-02 VITALS — BP 138/100 | HR 89 | Temp 98.1°F | Ht 69.0 in | Wt 232.2 lb

## 2018-03-02 DIAGNOSIS — R110 Nausea: Secondary | ICD-10-CM | POA: Diagnosis not present

## 2018-03-02 DIAGNOSIS — R1013 Epigastric pain: Secondary | ICD-10-CM | POA: Diagnosis not present

## 2018-03-02 DIAGNOSIS — K5790 Diverticulosis of intestine, part unspecified, without perforation or abscess without bleeding: Secondary | ICD-10-CM

## 2018-03-02 DIAGNOSIS — R1032 Left lower quadrant pain: Secondary | ICD-10-CM

## 2018-03-02 MED ORDER — CIPROFLOXACIN HCL 500 MG PO TABS: 500.0000 mg | ORAL_TABLET | Freq: Two times a day (BID) | ORAL | 0 refills | Status: DC

## 2018-03-02 MED ORDER — METRONIDAZOLE 500 MG PO TABS: 500.0000 mg | ORAL_TABLET | Freq: Three times a day (TID) | ORAL | 0 refills | Status: DC

## 2018-03-02 MED ORDER — ONDANSETRON HCL 8 MG PO TABS: 8.0000 mg | ORAL_TABLET | Freq: Three times a day (TID) | ORAL | 0 refills | Status: DC | PRN

## 2018-03-02 NOTE — Progress Notes (Signed)
  Subjective:     Patient ID: Robert Franklin, male   DOB: January 12, 1966, 53 y.o.   MRN: [id number] Chief Complaint  Patient presents with  . Nausea    body aches, cough, headache, can't eat, diarrhea.  tested positive for flu on 02/26/18   HPI Reports persistent nausea and has developed diarrhea with mild abdominal pain: "food just goes right through me." States fevers have resolved. Has hx of diverticulosis/diverticulitis. He is accompanied by his wife today.  Review of Systems     Objective:   Physical Exam Constitutional:      General: He is not in acute distress.    Appearance: He is ill-appearing.  Abdominal:     General: Bowel sounds are normal.     Tenderness: There is abdominal tenderness (epigastric/LLQ). Guarding: LLQ.  Neurological:     Mental Status: He is alert.   Ears: T.M's intact without inflammation Throat:tonsils absent Neck: no cervical adenopathy Lungs: clear     Assessment:    1. LLQ abdominal pain: will cover for diverticulitis - ciprofloxacin (CIPRO) 500 MG tablet; Take 1 tablet (500 mg total) by mouth 2 (two) times daily.  Dispense: 14 tablet; Refill: 0 - metroNIDAZOLE (FLAGYL) 500 MG tablet; Take 1 tablet (500 mg total) by mouth 3 (three) times daily.  Dispense: 21 tablet; Refill: 0  2. Epigastric pain  3. Diverticulosis  4. Nausea - ondansetron (ZOFRAN) 8 MG tablet; Take 1 tablet (8 mg total) by mouth every 8 (eight) hours as needed for nausea or vomiting.  Dispense: 20 tablet; Refill: 0    Plan:   Discussed use of Gatorade and resting bowels for 24 hours. Hold cholesterol medication and stop the promethazine.

## 2018-03-02 NOTE — Patient Instructions (Signed)
Start Gatorade for fluids and let your bowel rest for 24 hours then eat as tolerated low fiber foods. Hold cholesterol medication while ill. Stop the promethazine. Let us know if not improving or new symptoms.

## 2018-05-08 ENCOUNTER — Telehealth: Payer: Self-pay | Admitting: *Deleted

## 2018-05-08 DIAGNOSIS — R7989 Other specified abnormal findings of blood chemistry: Secondary | ICD-10-CM

## 2018-05-08 DIAGNOSIS — E781 Pure hyperglyceridemia: Secondary | ICD-10-CM

## 2018-05-08 DIAGNOSIS — E669 Obesity, unspecified: Secondary | ICD-10-CM

## 2018-05-08 DIAGNOSIS — R945 Abnormal results of liver function studies: Secondary | ICD-10-CM

## 2018-05-08 DIAGNOSIS — E786 Lipoprotein deficiency: Secondary | ICD-10-CM

## 2018-05-08 DIAGNOSIS — I2510 Atherosclerotic heart disease of native coronary artery without angina pectoris: Secondary | ICD-10-CM

## 2018-05-08 NOTE — Telephone Encounter (Signed)
Mailed letter and labslip  To patient due 4/30 /20

## 2018-05-08 NOTE — Telephone Encounter (Signed)
-----   Message from Raiford Simmonds, RN sent at 02/14/2018 10:30 AM EST ----- Mail letter and labslip @3 /22/20   Due @4 /22/20 liver and hepatic level

## 2018-05-11 ENCOUNTER — Other Ambulatory Visit: Payer: Self-pay | Admitting: Family Medicine

## 2018-05-22 DIAGNOSIS — E781 Pure hyperglyceridemia: Secondary | ICD-10-CM | POA: Diagnosis not present

## 2018-05-22 DIAGNOSIS — E669 Obesity, unspecified: Secondary | ICD-10-CM | POA: Diagnosis not present

## 2018-05-22 DIAGNOSIS — E786 Lipoprotein deficiency: Secondary | ICD-10-CM | POA: Diagnosis not present

## 2018-05-23 LAB — HEPATIC FUNCTION PANEL
ALT: 43 IU/L (ref 0–44)
AST: 36 IU/L (ref 0–40)
Albumin: 4.9 g/dL (ref 3.8–4.9)
Alkaline Phosphatase: 88 IU/L (ref 39–117)
Bilirubin Total: 0.7 mg/dL (ref 0.0–1.2)
Bilirubin, Direct: 0.19 mg/dL (ref 0.00–0.40)
Total Protein: 7.4 g/dL (ref 6.0–8.5)

## 2018-05-23 LAB — LIPID PANEL
Chol/HDL Ratio: 6.1 ratio — ABNORMAL HIGH (ref 0.0–5.0)
Cholesterol, Total: 190 mg/dL (ref 100–199)
HDL: 31 mg/dL — ABNORMAL LOW (ref >39)
Triglycerides: 458 mg/dL — ABNORMAL HIGH (ref 0–149)

## 2018-06-05 ENCOUNTER — Telehealth: Payer: Self-pay | Admitting: *Deleted

## 2018-06-05 MED ORDER — ROSUVASTATIN CALCIUM 20 MG PO TABS: 20.0000 mg | ORAL_TABLET | Freq: Every day | ORAL | 3 refills | Status: DC

## 2018-06-05 NOTE — Telephone Encounter (Signed)
Per dr Ellyn Hack,  Continue with Vascepa  2G  BID - PATIENT IS TAKING 2 TABLETS OF 1GM   Twice a day .    Start rosuvastatin 20 mg  Daily. Per dr Ellyn Hack , schedule an appointment with Dr Debara Pickett - lipid clinic.  RN  Informed patient of recommendation and appt given for 5/26 20. Patient aware it will virtual visit . RX SENT TO PHARMACY.

## 2018-06-05 NOTE — Telephone Encounter (Signed)
-----   Message from Leonie Man, MD sent at 05/30/2018  3:49 PM EDT ----- Lab results: Liver function tests look better.  Improved from 3 months ago.  Triglycerides are still running high. -- >  I would like to increase the Vascepa to 2 g BID & add Rosuvastatin 20 mg.  (change Rx - Vascepa 2g BID; Disp #180 tab, 4 refills.) Rosuvastatin 20 mg PO daily.  Disp:# 90 tab, 4 refills).  Recheck FLP-CMP in 6 months.  [place]

## 2018-06-12 ENCOUNTER — Encounter: Payer: Self-pay | Admitting: Family Medicine

## 2018-06-14 ENCOUNTER — Telehealth: Payer: Self-pay | Admitting: Internal Medicine

## 2018-06-14 NOTE — Telephone Encounter (Signed)
smartphone/ consent/ my chart active/ pre reg completed °

## 2018-06-19 ENCOUNTER — Telehealth: Payer: Self-pay | Admitting: Internal Medicine

## 2018-06-19 ENCOUNTER — Telehealth (INDEPENDENT_AMBULATORY_CARE_PROVIDER_SITE_OTHER): Payer: Commercial Managed Care - PPO | Admitting: Internal Medicine

## 2018-06-19 ENCOUNTER — Encounter: Payer: Self-pay | Admitting: Internal Medicine

## 2018-06-19 VITALS — BP 121/83 | HR 92 | Temp 97.5°F | Ht 69.0 in | Wt 242.0 lb

## 2018-06-19 DIAGNOSIS — R945 Abnormal results of liver function studies: Secondary | ICD-10-CM | POA: Diagnosis not present

## 2018-06-19 DIAGNOSIS — E8881 Metabolic syndrome: Secondary | ICD-10-CM

## 2018-06-19 DIAGNOSIS — I2510 Atherosclerotic heart disease of native coronary artery without angina pectoris: Secondary | ICD-10-CM | POA: Diagnosis not present

## 2018-06-19 DIAGNOSIS — E781 Pure hyperglyceridemia: Secondary | ICD-10-CM

## 2018-06-19 DIAGNOSIS — E669 Obesity, unspecified: Secondary | ICD-10-CM

## 2018-06-19 DIAGNOSIS — E785 Hyperlipidemia, unspecified: Secondary | ICD-10-CM

## 2018-06-19 DIAGNOSIS — R7989 Other specified abnormal findings of blood chemistry: Secondary | ICD-10-CM

## 2018-06-19 NOTE — Telephone Encounter (Signed)
Patient called to review e-visit instructions. Patient aware that the following changes have been made: NONE Patient aware that they will need the following labs: direct LDL & LP(a) now and lipid panel/direct LDL prior to next OV Patient aware that they will need the following test(s): NONE  Recall for 4 months entered.   No further assistance needed at this time.

## 2018-06-19 NOTE — Progress Notes (Signed)
Virtual Visit via Video Note   This visit type was conducted due to national recommendations for restrictions regarding the COVID-19 Pandemic (e.g. social distancing) in an effort to limit this patient's exposure and mitigate transmission in our community.  Due to his co-morbid illnesses, this patient is at least at moderate risk for complications without adequate follow up.  This format is felt to be most appropriate for this patient at this time.  All issues noted in this document were discussed and addressed.  A limited physical exam was performed with this format.  Please refer to the patient's chart for his consent to telehealth for [facility].   Evaluation Performed:  Doximity video visit  Date:  06/19/2018   ID:  Robert Franklin Franklin, DOB September 14, 1965, MRN [id number]  Patient Location:  [place] [address]  Provider location:   [address] [place],  [address]  PCP:  [name]., MD  Cardiologist:  Glenetta Hew, MD Electrophysiologist:  None   Chief Complaint:  New lipid patient  History of Present Illness:    Robert Franklin Franklin is a 53 y.o. male who presents via audio/video conferencing for a telehealth visit today.  Robert Franklin Franklin was seen today for a new lipid clinic visit.  His past medical history significant for family history of coronary disease which is extensive in father and grandfather.  He was found to have mild nonobstructive coronary disease with a 40 to 50% circumflex lesion by cath in 2010.  He was previously followed by Dr. Humphrey Rolls.  Is now seen by Dr. Ellyn Hack.  His other medical problems include type 2 diabetes, hypertension, moderate obesity, elevated LFTs with probable Karlene Lineman, metabolic syndrome, obstructive sleep apnea on BiPAP and short bowel syndrome.  The final problem apparently was related to prior cholecystectomy and colon surgery due to gangrenous appendix which time he may have had some bowel  resection.  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  There does seem to be family history of heart disease which I suspect is related to this.  He has had recurrent lipid profiles with high triglycerides between 400-800 in the past.  He has not had a recent direct LDL, so is not clear what his true LDL is.  He has been maintained on fenofibrate 160 mg daily and Dr. Ellyn Hack started him on Vascepa.  The patient was only taking 1 g twice daily and repeat lipids 4 weeks ago demonstrated total cholesterol 190, triglycerides 458, HDL of 31 and LDL was not calculated.  Based on those numbers however I suspect LDL is not significantly elevated, probably somewhere around 100.  He was then started on rosuvastatin 20 mg daily and his Vascepa was increased to 2 g twice daily.  [name] is working to improve his diet.  He says he is lost 30 pounds over the past year.  He is a former smoker but quit in 2007.  He has been trying to grill or steam more foods and purchased an air [employer].  He does make [place] fries at home.  He is trying to cut down saturated fats.  He is not willing to go to a plant-based diet because of interest in meats.  He understands that he is able to eat healthy although needs to select meat sources carefully.  The patient does not have symptoms concerning for COVID-19 infection (fever,  chills, cough, or new SHORTNESS OF BREATH).    Prior CV studies:   The following studies were reviewed today:  Chart reviewed Lab work  PMHx:  Past Medical History:  Diagnosis Date   Coronary artery disease, non-occlusive 02/28/2008   Dr. Humphrey Rolls @ [place]: 40-50% LCx lesion by cath,  EF 60% with normal wall motion.   Diabetes mellitus without complication ([place])    Essential hypertension    GERD (gastroesophageal reflux disease)    Headache    High triglycerides     was previously on  treatment, but  he notes that this was stopped   Kidney stones    Moderate obesity 05/29/2014   OSA treated with BiPAP    Short bowel syndrome 1994    Past Surgical History:  Procedure Laterality Date   "stomach wrap"  1991   GERD   APPENDECTOMY     CARDIAC CATHETERIZATION  02/2008   Dr. Humphrey Rolls @ [facility]: 40-50% LCx lesion by cath,  EF 60% with normal wall motion.   Cardiolite Nuclear Stress Test   January 2010    heart rate increased from 80-158 BPM. No EKG changes.  there is a moderate sized  reversible defect in the inferior and inferior lateral wall as well as anteroseptal. EF 59%.   CardioNet Holter Monitor   January 2010    sinus rhythm. Rare PVCs in setting was. Current PACs , seen in singles and pairs. Max heart rate 127, minimum heart rate 57. Average 84.   CHOLECYSTECTOMY     COLON SURGERY     due to gangrenous appendix   COLONOSCOPY WITH PROPOFOL N/A 04/26/2016   Procedure: COLONOSCOPY WITH PROPOFOL;  Surgeon: Jonathon Bellows, MD;  Location: [facility] ENDOSCOPY;  Service: Endoscopy;  Laterality: N/A;   HERNIA REPAIR  2010   INNER EAR SURGERY     TONSILLECTOMY     TYMPANOSTOMY TUBE PLACEMENT Bilateral 1973    FAMHx:  Family History  Problem Relation Age of Onset   Hypertension Mother    Irritable bowel syndrome Mother    Migraines Mother    Lumbar disc disease Mother    Heart attack Mother    Heart attack Maternal Uncle    Heart attack Maternal Grandfather     SOCHx:   reports that he quit smoking about 21 years ago. His smoking use included cigarettes. He has a 28.00 pack-year smoking history. He has never used smokeless tobacco. He reports that he does not drink alcohol or use drugs.  ALLERGIES:  Allergies  Allergen Reactions   Mucinex [Guaifenesin Er] Other (See Comments)    Body tingles all over    MEDS:  Current Meds  Medication Sig   butalbital-acetaminophen-caffeine (FIORICET, ESGIC) 50-325-40 MG tablet Take 1 tablet by mouth 2 (two) times  daily as needed for headache.   CIALIS 20 MG tablet TAKE 0.5 TABLETS (10 MG TOTAL) BY MOUTH DAILY AS NEEDED FOR ERECTILE DYSFUNCTION.   fenofibrate 160 MG tablet Take 1 tablet (160 mg total) by mouth daily.   hydrochlorothiazide (HYDRODIURIL) 25 MG tablet TAKE 1 TABLET BY MOUTH EVERY DAY WITH LOSARTAN   Icosapent Ethyl (VASCEPA) 1 g CAPS Take 2 capsules (2 g total) by mouth 2 (two) times daily.   losartan (COZAAR) 100 MG tablet TAKE 1 TABLET BY MOUTH EVERY DAY. TAKE WITH HCTZ   metFORMIN (GLUCOPHAGE) 1000 MG tablet TAKE 1 TABLET (1,000 MG TOTAL) BY MOUTH 2 (TWO) TIMES DAILY WITH A MEAL.   metoprolol succinate (TOPROL-XL) 100 MG 24  hr tablet TAKE 1 TABLET BY MOUTH EVERY DAY   omeprazole (PRILOSEC OTC) 20 MG tablet Take 1 tablet (20 mg total) by mouth daily.   rosuvastatin (CRESTOR) 20 MG tablet Take 1 tablet (20 mg total) by mouth daily.     ROS: Pertinent items noted in HPI and remainder of comprehensive ROS otherwise negative.  Labs/Other Tests and Data Reviewed:    Recent Labs: 02/01/2018: BUN 19; Creatinine, Ser 0.97; Potassium 4.3; Sodium 138 05/22/2018: ALT 43   Recent Lipid Panel Lab Results  Component Value Date/Time   CHOL 190 05/22/2018 12:37 PM   TRIG 458 (H) 05/22/2018 12:37 PM   HDL 31 (L) 05/22/2018 12:37 PM   CHOLHDL 6.1 (H) 05/22/2018 12:37 PM   LDLCALC Comment 05/22/2018 12:37 PM    Wt Readings from Last 3 Encounters:  06/19/18 242 lb (109.8 kg)  03/02/18 232 lb 3.2 oz (105.3 kg)  02/26/18 238 lb 3.2 oz (108 kg)     Exam:    Vital Signs:  BP 121/83    Pulse 92    Temp (!) 97.5 F (36.4 C)    Ht 5\' 9"  (1.753 m)    Wt 242 lb (109.8 kg)    BMI 35.74 kg/m    General appearance: alert, no distress and moderately obese Lungs: No audible or visual respiratory difficulty Abdomen: Moderately obese Extremities: extremities normal, atraumatic, no cyanosis or edema Skin: Skin color, texture, turgor normal. No rashes or lesions Neurologic: Mental status:  Alert, oriented, thought content appropriate : Pleasant  ASSESSMENT & PLAN:    1. Mixed dyslipidemia with high triglycerides, low HDL and likely elevated LDL 2. Type 2 diabetes 3. Moderate nonobstructive coronary disease 4. Moderate obesity with metabolic syndrome 5. History of elevated liver enzymes 6. Short-bowel syndrome 7. Hypertension  Mr Stief is multiple coronary risk factors including moderate obesity with metabolic syndrome, probable fatty liver disease, type 2 diabetes, hypertension and nonobstructive coronary disease by cath.  He has a mixed dyslipidemia with mostly elevated triglycerides.  I agree with the dose of Vascepa 2 g twice daily.  Additionally I would agree with adding high potency statin therapy with rosuvastatin 20 mg daily.  He has only been on the statin for about a week now.  I would like to get a direct LDL to determine what our start point is since his LDL cannot be calculated.  In addition will get a LP(a) given his early onset coronary disease and strong family history.  We will plan follow-up in lipid clinic with a lipid profile and direct LDL in about 4 months.  Thanks again for the kind referral.  COVID-19 Education: The signs and symptoms of COVID-19 were discussed with the patient and how to seek care for testing (follow up with PCP or arrange E-visit).  The importance of social distancing was discussed today.  Patient Risk:   After full review of this patients clinical status, I feel that they are at least moderate risk at this time.  Time:   Today, I have spent 25 minutes with the patient with telehealth technology discussing triglyceride management, dietary modifications, coronary artery disease, lifestyle education.     Medication Adjustments/Labs and Tests Ordered: Current medicines are reviewed at length with the patient today.  Concerns regarding medicines are outlined above.   Tests Ordered: Orders Placed This Encounter  Procedures    Lipoprotein A (LPA)   LDL cholesterol, direct    Medication Changes: No orders of the defined types were placed in  this encounter.   Disposition:  in 4 month(s)  Pixie Casino, MD, [facility], [place] Director of the Advanced Lipid Disorders &  Cardiovascular Risk Reduction Clinic Diplomate of the American Board of Clinical Lipidology Attending Cardiologist  Direct Dial: [phone]   Fax: [phone]  Website:  [email or web address]  Pixie Casino, MD  06/19/2018 8:50 AM

## 2018-06-19 NOTE — Patient Instructions (Signed)
Medication Instructions:  Your physician recommends that you continue on your current medications as directed. Please refer to the Current Medication list given to you today.  If you need a refill on your cardiac medications before your next appointment, please call your pharmacy.   Lab work: Your physician recommends that you return for lab work within the next week to check direct LDL and lipoprotein A or LP(a). You can complete at any [facility] location convenient for you.   If you have labs (blood work) drawn today and your tests are completely normal, you will receive your results only by: Marland Kitchen MyChart Message (if you have MyChart) OR . A paper copy in the mail If you have any lab test that is abnormal or we need to change your treatment, we will call you to review the results.  Testing/Procedures: NONE  Follow-Up: Dr. Debara Pickett recommends that you schedule a follow up visit with him the in the [place] in 4 months. Please have fasting blood work about 1 week prior to this visit and he will review the blood work results with you at your appointment. We will mail you the lab order for this.

## 2018-06-25 ENCOUNTER — Encounter: Payer: Self-pay | Admitting: Family Medicine

## 2018-07-05 ENCOUNTER — Other Ambulatory Visit: Payer: Self-pay

## 2018-07-05 ENCOUNTER — Ambulatory Visit (INDEPENDENT_AMBULATORY_CARE_PROVIDER_SITE_OTHER): Payer: Commercial Managed Care - PPO | Admitting: Family Medicine

## 2018-07-05 ENCOUNTER — Encounter: Payer: Self-pay | Admitting: Family Medicine

## 2018-07-05 VITALS — BP 138/70 | HR 69 | Temp 98.1°F | Wt 238.0 lb

## 2018-07-05 DIAGNOSIS — Z0000 Encounter for general adult medical examination without abnormal findings: Secondary | ICD-10-CM

## 2018-07-05 DIAGNOSIS — E669 Obesity, unspecified: Secondary | ICD-10-CM | POA: Diagnosis not present

## 2018-07-05 DIAGNOSIS — G4733 Obstructive sleep apnea (adult) (pediatric): Secondary | ICD-10-CM

## 2018-07-05 DIAGNOSIS — E119 Type 2 diabetes mellitus without complications: Secondary | ICD-10-CM | POA: Diagnosis not present

## 2018-07-05 DIAGNOSIS — E8881 Metabolic syndrome: Secondary | ICD-10-CM

## 2018-07-05 LAB — POCT GLYCOSYLATED HEMOGLOBIN (HGB A1C)
Est. average glucose Bld gHb Est-mCnc: 154
Hemoglobin A1C: 7 % — AB (ref 4.0–5.6)

## 2018-07-05 NOTE — Progress Notes (Signed)
Patient: Robert Franklin, Male    DOB: Apr 28, 1965, 53 y.o.   MRN: [id number] Visit Date: 07/05/2018  Today's Provider: [name], MD   Chief Complaint  Patient presents with  . Annual Exam   Subjective:    Annual physical exam Robert Franklin is a 53 y.o. male who presents today for health maintenance and complete physical. He feels well. He reports exercising none. He reports he is sleeping fairly well. He is being evaluated for dyslipidemia by cardiology. Patient now married.  He works for [employer] in [place] -----------------------------------------------------------------  Last colonoscopy: 04/26/2016  Review of Systems  Constitutional: Negative.   HENT: Negative.   Eyes: Negative.   Respiratory: Negative.   Cardiovascular: Negative.   Gastrointestinal: Negative.   Endocrine: Negative.   Genitourinary: Negative.   Musculoskeletal: Negative.   Skin: Negative.   Allergic/Immunologic: Negative.   Neurological: Negative.   Hematological: Negative.   Psychiatric/Behavioral: Negative.     Social History He  reports that he quit smoking about 21 years ago. His smoking use included cigarettes. He has a 28.00 pack-year smoking history. He has never used smokeless tobacco. He reports that he does not drink alcohol or use drugs. Social History   Socioeconomic History  . Marital status: Divorced    Spouse name: Robert Franklin  . Number of children: 0  . Years of education: 59  . Highest education level: Not on file  Occupational History  . 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 Financial resource strain: Not on file  . Food insecurity    Worry: Not on file    Inability: Not on file  . Transportation needs    Medical: Not on file    Non-medical: Not on file  Tobacco Use  . Smoking status: Former Smoker    Packs/day: 1.00    Years: 28.00    Pack years: 28.00    Types: Cigarettes    Quit date: 12/24/1996    Years since  quitting: 21.5  . Smokeless tobacco: Never Used  Substance and Sexual Activity  . Alcohol use: Never    Frequency: Never    Comment: seldom  . Drug use: No  . Sexual activity: Not Currently  Lifestyle  . Physical activity    Days per week: Not on file    Minutes per session: Not on file  . Stress: Not on file  Relationships  . Social Herbalist on phone: Not on file    Gets together: Not on file    Attends religious service: Not on file    Active member of club or organization: Not on file    Attends meetings of clubs or organizations: Not on file    Relationship status: Not on file  Other Topics Concern  . 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    Patient Active Problem List   Diagnosis Date Noted  . Medication management 01/01/2016  . Hypertriglyceridemia without hypercholesterolemia 02/26/2015  . Abnormally low high density lipoprotein (HDL) cholesterol with hypertriglyceridemia 02/26/2015  . Atrial premature contractions 02/26/2015  . Metabolic syndrome 54/65/0354  . Rapid heart beat 02/26/2015  . Signs and symptoms involving emotional state 05/29/2014  . Anxiety 05/29/2014  . Adult BMI 30+ 05/29/2014  . Clinical depression 05/29/2014  .  Failure of erection 05/29/2014  . Abnormal LFTs 05/29/2014  . Blood glucose elevated 05/29/2014  . GERD (gastroesophageal reflux disease) 05/29/2014  . Combined fat and carbohydrate induced hyperlipemia 05/29/2014  . Essential hypertension 05/29/2014  . Headache, migraine 05/29/2014  . Obesity, Class II, BMI 35-39.9 05/29/2014  . Gastroduodenal ulcer 05/29/2014  . Apnea, sleep 05/29/2014  . Umbilical hernia without obstruction and without gangrene 05/29/2014  . Avitaminosis D 09/14/2009  . Arteriosclerosis of coronary artery 02/27/2009    Past Surgical History:  Procedure Laterality Date  .  "stomach wrap"  1991   GERD  . APPENDECTOMY    . CARDIAC CATHETERIZATION  02/2008   Dr. Humphrey Franklin @ [facility]: 40-50% LCx lesion by cath,  EF 60% with normal wall motion.  Robert Franklin Nuclear Stress Test   January 2010    heart rate increased from 80-158 BPM. No EKG changes.  there is a moderate sized  reversible defect in the inferior and inferior lateral wall as well as anteroseptal. EF 59%.  . CardioNet Holter Monitor   January 2010    sinus rhythm. Rare PVCs in setting was. Current PACs , seen in singles and pairs. Max heart rate 127, minimum heart rate 57. Average 84.  . CHOLECYSTECTOMY    . COLON SURGERY     due to gangrenous appendix  . COLONOSCOPY WITH PROPOFOL N/A 04/26/2016   Procedure: COLONOSCOPY WITH PROPOFOL;  Surgeon: Robert Bellows, MD;  Location: [facility] ENDOSCOPY;  Service: Endoscopy;  Laterality: N/A;  . HERNIA REPAIR  2010  . INNER EAR SURGERY    . TONSILLECTOMY    . TYMPANOSTOMY TUBE PLACEMENT Bilateral 1973    Family History  Family Status  Relation Name Status  . Mother  Alive  . Father  Alive  . Brother  Alive  . Brother  Alive  . Robert Franklin  . MGF  Deceased  . Robert Uncle  Alive   His family history includes Heart attack in his maternal grandfather, maternal uncle, and mother; Hypertension in his mother; Irritable bowel syndrome in his mother; Lumbar disc disease in his mother; Migraines in his mother.     Allergies  Allergen Reactions  . Mucinex [Guaifenesin Er] Other (See Comments)    Body tingles all over    Previous Medications   BUTALBITAL-ACETAMINOPHEN-CAFFEINE (FIORICET, ESGIC) 50-325-40 MG TABLET    Take 1 tablet by mouth 2 (two) times daily as needed for headache.   CIALIS 20 MG TABLET    TAKE 0.5 TABLETS (10 MG TOTAL) BY MOUTH DAILY AS NEEDED FOR ERECTILE DYSFUNCTION.   FENOFIBRATE 160 MG TABLET    Take 1 tablet (160 mg total) by mouth daily.   HYDROCHLOROTHIAZIDE (HYDRODIURIL) 25 MG TABLET    TAKE 1 TABLET BY MOUTH EVERY DAY WITH LOSARTAN   ICOSAPENT  ETHYL (VASCEPA) 1 G CAPS    Take 2 capsules (2 g total) by mouth 2 (two) times daily.   LOSARTAN (COZAAR) 100 MG TABLET    TAKE 1 TABLET BY MOUTH EVERY DAY. TAKE WITH HCTZ   METFORMIN (GLUCOPHAGE) 1000 MG TABLET    TAKE 1 TABLET (1,000 MG TOTAL) BY MOUTH 2 (TWO) TIMES DAILY WITH A MEAL.   METOPROLOL SUCCINATE (TOPROL-XL) 100 MG 24 HR TABLET    TAKE 1 TABLET BY MOUTH EVERY DAY   OMEPRAZOLE (PRILOSEC OTC) 20 MG TABLET    Take 1 tablet (20 mg total) by mouth daily.   ONDANSETRON (ZOFRAN) 8 MG TABLET    Take 1 tablet (  8 mg total) by mouth every 8 (eight) hours as needed for nausea or vomiting.   PROMETHAZINE-DEXTROMETHORPHAN (PROMETHAZINE-DM) 6.25-15 MG/5ML SYRUP    Take 5 mLs by mouth 4 (four) times daily as needed for cough.   ROSUVASTATIN (CRESTOR) 20 MG TABLET    Take 1 tablet (20 mg total) by mouth daily.   [place]        Patient Care Team: [name]., MD as PCP - General (Family Medicine) Leonie Man, MD as PCP - Cardiology (Cardiology)      Objective:   Vitals: BP 138/70 (BP Location: Right Arm, Patient Position: Sitting, Cuff Size: Normal)   Pulse 69   Temp 98.1 F (36.7 C) (Oral)   Wt 238 lb (108 kg)   SpO2 97%   BMI 35.15 kg/m    Physical Exam Vitals signs reviewed.  Constitutional:      Appearance: He is well-developed. He is obese.  HENT:     Head: Normocephalic and atraumatic.     Right Ear: Tympanic membrane and external ear normal.     Left Ear: Tympanic membrane and external ear normal.     Nose: Nose normal.  Eyes:     Conjunctiva/sclera: Conjunctivae normal.     Pupils: Pupils are equal, round, and reactive to light.  Neck:     Musculoskeletal: Normal range of motion and neck supple.  Cardiovascular:     Rate and Rhythm: Regular rhythm.     Heart sounds: Normal heart sounds.  Pulmonary:     Effort: Pulmonary effort is normal.     Breath sounds: Normal breath sounds.  Abdominal:     General: Bowel sounds are normal.      Palpations: Abdomen is soft.     Comments: Small umbilical hernia.  Musculoskeletal: Normal range of motion.  Skin:    General: Skin is warm and dry.  Neurological:     General: No focal deficit present.     Mental Status: He is alert and oriented to person, place, and time.  Psychiatric:        Behavior: Behavior normal.        Thought Content: Thought content normal.        Judgment: Judgment normal.      Depression Screen PHQ 2/9 Scores 02/26/2018 06/07/2017 03/28/2016 07/14/2014  PHQ - 2 Score 0 0 1 0  PHQ- 9 Score - 0 9 -      Assessment & Plan:     Routine Health Maintenance and Physical Exam  Exercise Activities and Dietary recommendations Goals   None     Immunization History  Administered Date(s) Administered  . Tdap 01/28/2010    Health Maintenance  Topic Date Due  . PNEUMOCOCCAL POLYSACCHARIDE VACCINE AGE 69-64 HIGH RISK  05/17/1968  . FOOT EXAM  05/17/1976  . OPHTHALMOLOGY EXAM  05/17/1976  . HIV Screening  05/17/1981  . HEMOGLOBIN A1C  06/19/2018  . INFLUENZA VACCINE  08/25/2018  . TETANUS/TDAP  01/29/2020  . COLONOSCOPY  04/26/2021     Discussed health benefits of physical activity, and encouraged him to engage in regular exercise appropriate for his age and condition.  1. Diabetes mellitus without complication ([place])  - POCT glycosylated hemoglobin (Hb A1C)--7.0 today  2. Annual physical exam  - CBC with Differential/Platelet - Comprehensive metabolic panel - PSA - TSH  3. Metabolic syndrome   4. Obstructive sleep apnea syndrome   5. Obesity, Class II, BMI 35-39.9 Lifestyle stressed.       --------------------------------------------------------------------  I have done the exam and reviewed the chart and it is accurate to the best of my knowledge. Development worker, community has been used and  any errors in dictation or transcription are unintentional. Miguel Aschoff M.D. [facility]

## 2018-07-06 LAB — LDL CHOLESTEROL, DIRECT: LDL Direct: 46 mg/dL (ref 0–99)

## 2018-07-06 LAB — LIPOPROTEIN A (LPA): Lipoprotein (a): 11 nmol/L (ref <75.0)

## 2018-07-08 DIAGNOSIS — E119 Type 2 diabetes mellitus without complications: Secondary | ICD-10-CM | POA: Insufficient documentation

## 2018-07-10 ENCOUNTER — Other Ambulatory Visit: Payer: Self-pay | Admitting: Family Medicine

## 2018-07-14 ENCOUNTER — Other Ambulatory Visit: Payer: Self-pay | Admitting: Family Medicine

## 2018-08-07 ENCOUNTER — Other Ambulatory Visit: Payer: Self-pay

## 2018-08-07 ENCOUNTER — Encounter: Payer: Self-pay | Admitting: Family Medicine

## 2018-08-07 ENCOUNTER — Ambulatory Visit (INDEPENDENT_AMBULATORY_CARE_PROVIDER_SITE_OTHER): Payer: Commercial Managed Care - PPO | Admitting: Family Medicine

## 2018-08-07 ENCOUNTER — Ambulatory Visit
Admission: RE | Admit: 2018-08-07 | Payer: Commercial Managed Care - PPO | Source: Ambulatory Visit | Admitting: *Deleted

## 2018-08-07 VITALS — BP 126/82 | HR 60 | Temp 98.0°F | Resp 16 | Wt 238.0 lb

## 2018-08-07 DIAGNOSIS — E65 Localized adiposity: Secondary | ICD-10-CM

## 2018-08-07 DIAGNOSIS — I10 Essential (primary) hypertension: Secondary | ICD-10-CM | POA: Diagnosis not present

## 2018-08-07 DIAGNOSIS — G4733 Obstructive sleep apnea (adult) (pediatric): Secondary | ICD-10-CM

## 2018-08-07 DIAGNOSIS — M545 Low back pain, unspecified: Secondary | ICD-10-CM

## 2018-08-07 DIAGNOSIS — E119 Type 2 diabetes mellitus without complications: Secondary | ICD-10-CM

## 2018-08-07 NOTE — Progress Notes (Signed)
Patient: Robert Franklin Male    DOB: [birth year]   53 y.o.   MRN: [id number] Visit Date: 08/07/2018  Today's Provider: [name], MD   Chief Complaint  Patient presents with  . Follow-up   Subjective:    HPI Patient comes in today for a follow up. He was last seen in the office 1 month ago.   He is trying ot work on lifestyle. He is feeling well other than chronc low back pain. No radicular symptoms. Allergies  Allergen Reactions  . Mucinex [Guaifenesin Er] Other (See Comments)    Body tingles all over     Current Outpatient Medications:  .  butalbital-acetaminophen-caffeine (FIORICET, ESGIC) 50-325-40 MG tablet, Take 1 tablet by mouth 2 (two) times daily as needed for headache., Disp: 30 tablet, Rfl: 4 .  hydrochlorothiazide (HYDRODIURIL) 25 MG tablet, TAKE 1 TABLET BY MOUTH EVERY DAY WITH LOSARTAN, Disp: 90 tablet, Rfl: 3 .  metFORMIN (GLUCOPHAGE) 1000 MG tablet, TAKE 1 TABLET (1,000 MG TOTAL) BY MOUTH 2 (TWO) TIMES DAILY WITH A MEAL., Disp: 180 tablet, Rfl: 1 .  CIALIS 20 MG tablet, TAKE 0.5 TABLETS (10 MG TOTAL) BY MOUTH DAILY AS NEEDED FOR ERECTILE DYSFUNCTION., Disp: 10 tablet, Rfl: 2 .  fenofibrate 160 MG tablet, Take 1 tablet (160 mg total) by mouth daily., Disp: 90 tablet, Rfl: 3 .  Icosapent Ethyl (VASCEPA) 1 g CAPS, Take 2 capsules (2 g total) by mouth 2 (two) times daily., Disp: 120 capsule, Rfl: 11 .  losartan (COZAAR) 100 MG tablet, TAKE 1 TABLET BY MOUTH EVERY DAY. TAKE WITH HCTZ, Disp: 90 tablet, Rfl: 3 .  metoprolol succinate (TOPROL-XL) 100 MG 24 hr tablet, TAKE 1 TABLET BY MOUTH EVERY DAY, Disp: 90 tablet, Rfl: 3 .  omeprazole (PRILOSEC OTC) 20 MG tablet, Take 1 tablet (20 mg total) by mouth daily., Disp: 30 tablet, Rfl: 12 .  ondansetron (ZOFRAN) 8 MG tablet, Take 1 tablet (8 mg total) by mouth every 8 (eight) hours as needed for nausea or vomiting., Disp: 20 tablet, Rfl: 0 .  promethazine-dextromethorphan (PROMETHAZINE-DM) 6.25-15 MG/5ML  syrup, Take 5 mLs by mouth 4 (four) times daily as needed for cough., Disp: 240 mL, Rfl: 0 .  rosuvastatin (CRESTOR) 20 MG tablet, Take 1 tablet (20 mg total) by mouth daily., Disp: 90 tablet, Rfl: 3 .  VASCEPA 1 g CAPS, , Disp: , Rfl:   Review of Systems  Constitutional: Negative for activity change, fatigue and fever.  Eyes: Negative.   Respiratory: Negative for cough and shortness of breath.   Cardiovascular: Negative for chest pain, palpitations and leg swelling.  Gastrointestinal: Negative.   Endocrine: Negative for cold intolerance, heat intolerance, polydipsia, polyphagia and polyuria.  Musculoskeletal: Positive for back pain.       He has chronic low back pain without radicular symptoms.  Allergic/Immunologic: Negative.   Neurological: Negative for dizziness, light-headedness and headaches.  Psychiatric/Behavioral: Negative for agitation, self-injury, sleep disturbance and suicidal ideas. The patient is not nervous/anxious and is not hyperactive.     Social History   Tobacco Use  . Smoking status: Former Smoker    Packs/day: 1.00    Years: 28.00    Pack years: 28.00    Types: Cigarettes    Quit date: 12/24/1996    Years since quitting: 21.6  . Smokeless tobacco: Never Used  Substance Use Topics  . Alcohol use: Never    Frequency: Never    Comment: seldom  Objective:   BP 126/82   Pulse 60   Temp 98 F (36.7 C)   Resp 16   Wt 238 lb (108 kg)   SpO2 96%   BMI 35.15 kg/m  Vitals:   08/07/18 1611  BP: 126/82  Pulse: 60  Resp: 16  Temp: 98 F (36.7 C)  SpO2: 96%  Weight: 238 lb (108 kg)     Physical Exam Vitals signs reviewed.  Constitutional:      Appearance: He is well-developed.  HENT:     Head: Normocephalic and atraumatic.     Right Ear: External ear normal.     Left Ear: External ear normal.     Nose: Nose normal.  Eyes:     Conjunctiva/sclera: Conjunctivae normal.  Neck:     Thyroid: No thyromegaly.  Cardiovascular:     Rate and  Rhythm: Normal rate and regular rhythm.     Heart sounds: Normal heart sounds.  Pulmonary:     Effort: Pulmonary effort is normal.     Breath sounds: Normal breath sounds.  Abdominal:     Palpations: Abdomen is soft.  Musculoskeletal:     Comments: No abnormality on exam of low back.  Skin:    General: Skin is warm and dry.  Neurological:     Mental Status: He is alert and oriented to person, place, and time.  Psychiatric:        Behavior: Behavior normal.        Thought Content: Thought content normal.        Judgment: Judgment normal.      No results found for any visits on 08/07/18.     Assessment & Plan    1. Type 2 diabetes mellitus without complication, without long-term current use of insulin (HCC) A1C is 6.5  No change. More than 50% 25 minute visit spent in counseling or coordination of care  2. Low back pain, unspecified back pain laterality, unspecified chronicity, unspecified whether sciatica present Mechanical. Consider PT referral. Pt states he had hip dislocation at birth. - DG Lumbar Spine Complete; Future  3. Essential hypertension Controlled.  4. Obstructive sleep apnea syndrome On CPAP.  5. Central adiposity With OSA/DM.     Takeo Harts Cranford Mon, MD  [facility]

## 2018-08-07 NOTE — Patient Instructions (Signed)

## 2018-08-08 LAB — CBC WITH DIFFERENTIAL/PLATELET
Basophils Absolute: 0.1 10*3/uL (ref 0.0–0.2)
Basos: 1 %
EOS (ABSOLUTE): 0.1 10*3/uL (ref 0.0–0.4)
Eos: 1 %
Hematocrit: 47.2 % (ref 37.5–51.0)
Hemoglobin: 16.6 g/dL (ref 13.0–17.7)
Immature Grans (Abs): 0 10*3/uL (ref 0.0–0.1)
Immature Granulocytes: 1 %
Lymphocytes Absolute: 2.5 10*3/uL (ref 0.7–3.1)
Lymphs: 32 %
MCH: 30.5 pg (ref 26.6–33.0)
MCHC: 35.2 g/dL (ref 31.5–35.7)
MCV: 87 fL (ref 79–97)
Monocytes Absolute: 0.6 10*3/uL (ref 0.1–0.9)
Monocytes: 7 %
Neutrophils Absolute: 4.6 10*3/uL (ref 1.4–7.0)
Neutrophils: 58 %
Platelets: 224 10*3/uL (ref 150–450)
RBC: 5.44 x10E6/uL (ref 4.14–5.80)
RDW: 13.2 % (ref 11.6–15.4)
WBC: 7.9 10*3/uL (ref 3.4–10.8)

## 2018-08-08 LAB — COMPREHENSIVE METABOLIC PANEL
ALT: 38 IU/L (ref 0–44)
AST: 41 IU/L — ABNORMAL HIGH (ref 0–40)
Albumin/Globulin Ratio: 1.7 (ref 1.2–2.2)
Albumin: 4.8 g/dL (ref 3.8–4.9)
Alkaline Phosphatase: 71 IU/L (ref 39–117)
BUN/Creatinine Ratio: 19 (ref 9–20)
BUN: 20 mg/dL (ref 6–24)
Bilirubin Total: 1 mg/dL (ref 0.0–1.2)
CO2: 21 mmol/L (ref 20–29)
Calcium: 9.8 mg/dL (ref 8.7–10.2)
Chloride: 98 mmol/L (ref 96–106)
Creatinine, Ser: 1.03 mg/dL (ref 0.76–1.27)
GFR calc Af Amer: 96 mL/min/{1.73_m2} (ref >59)
GFR calc non Af Amer: 83 mL/min/{1.73_m2} (ref >59)
Globulin, Total: 2.8 g/dL (ref 1.5–4.5)
Glucose: 129 mg/dL — ABNORMAL HIGH (ref 65–99)
Potassium: 5 mmol/L (ref 3.5–5.2)
Sodium: 137 mmol/L (ref 134–144)
Total Protein: 7.6 g/dL (ref 6.0–8.5)

## 2018-08-08 LAB — PSA: Prostate Specific Ag, Serum: 0.4 ng/mL (ref 0.0–4.0)

## 2018-08-08 LAB — TSH: TSH: 1.74 u[IU]/mL (ref 0.450–4.500)

## 2018-08-09 ENCOUNTER — Telehealth: Payer: Self-pay

## 2018-08-09 NOTE — Telephone Encounter (Signed)
Patients mother has been advised. KW

## 2018-08-09 NOTE — Telephone Encounter (Signed)
-----   Message from [name]., MD sent at 08/08/2018 10:49 AM EDT ----- Labs WNL.

## 2018-08-10 DIAGNOSIS — E65 Localized adiposity: Secondary | ICD-10-CM | POA: Insufficient documentation

## 2018-09-05 ENCOUNTER — Other Ambulatory Visit: Payer: Self-pay | Admitting: Family Medicine

## 2018-09-19 ENCOUNTER — Other Ambulatory Visit: Payer: Self-pay | Admitting: *Deleted

## 2018-09-19 ENCOUNTER — Other Ambulatory Visit: Payer: Self-pay | Admitting: Internal Medicine

## 2018-09-19 DIAGNOSIS — E785 Hyperlipidemia, unspecified: Secondary | ICD-10-CM

## 2018-09-19 DIAGNOSIS — E781 Pure hyperglyceridemia: Secondary | ICD-10-CM

## 2018-10-13 ENCOUNTER — Encounter: Payer: Self-pay | Admitting: Emergency Medicine

## 2018-10-13 ENCOUNTER — Emergency Department
Admission: EM | Admit: 2018-10-13 | Discharge: 2018-10-13 | Disposition: A | Discharge status: Home / Self Care | Payer: Commercial Managed Care - PPO | Attending: Emergency Medicine | Admitting: Emergency Medicine

## 2018-10-13 ENCOUNTER — Other Ambulatory Visit: Payer: Self-pay

## 2018-10-13 ENCOUNTER — Emergency Department: Payer: Commercial Managed Care - PPO

## 2018-10-13 DIAGNOSIS — I259 Chronic ischemic heart disease, unspecified: Secondary | ICD-10-CM | POA: Insufficient documentation

## 2018-10-13 DIAGNOSIS — Z79899 Other long term (current) drug therapy: Secondary | ICD-10-CM | POA: Diagnosis not present

## 2018-10-13 DIAGNOSIS — R1084 Generalized abdominal pain: Secondary | ICD-10-CM | POA: Diagnosis present

## 2018-10-13 DIAGNOSIS — I10 Essential (primary) hypertension: Secondary | ICD-10-CM | POA: Diagnosis not present

## 2018-10-13 DIAGNOSIS — Z7984 Long term (current) use of oral hypoglycemic drugs: Secondary | ICD-10-CM | POA: Insufficient documentation

## 2018-10-13 DIAGNOSIS — E119 Type 2 diabetes mellitus without complications: Secondary | ICD-10-CM | POA: Insufficient documentation

## 2018-10-13 DIAGNOSIS — Z87891 Personal history of nicotine dependence: Secondary | ICD-10-CM | POA: Insufficient documentation

## 2018-10-13 DIAGNOSIS — Z20828 Contact with and (suspected) exposure to other viral communicable diseases: Secondary | ICD-10-CM | POA: Insufficient documentation

## 2018-10-13 DIAGNOSIS — K529 Noninfective gastroenteritis and colitis, unspecified: Secondary | ICD-10-CM | POA: Insufficient documentation

## 2018-10-13 LAB — COMPREHENSIVE METABOLIC PANEL
ALT: 36 U/L (ref 0–44)
AST: 39 U/L (ref 15–41)
Albumin: 4.2 g/dL (ref 3.5–5.0)
Alkaline Phosphatase: 57 U/L (ref 38–126)
Anion gap: 11 (ref 5–15)
BUN: 13 mg/dL (ref 6–20)
CO2: 23 mmol/L (ref 22–32)
Calcium: 9.1 mg/dL (ref 8.9–10.3)
Chloride: 102 mmol/L (ref 98–111)
Creatinine, Ser: 0.91 mg/dL (ref 0.61–1.24)
GFR calc Af Amer: >60 mL/min (ref >60)
GFR calc non Af Amer: >60 mL/min (ref >60)
Glucose, Bld: 296 mg/dL — ABNORMAL HIGH (ref 70–99)
Potassium: 3.5 mmol/L (ref 3.5–5.1)
Sodium: 136 mmol/L (ref 135–145)
Total Bilirubin: 1.4 mg/dL — ABNORMAL HIGH (ref 0.3–1.2)
Total Protein: 7.6 g/dL (ref 6.5–8.1)

## 2018-10-13 LAB — URINALYSIS, COMPLETE (UACMP) WITH MICROSCOPIC
Bacteria, UA: NONE SEEN
Bilirubin Urine: NEGATIVE
Glucose, UA: >=500 mg/dL — AB
Hgb urine dipstick: NEGATIVE
Ketones, ur: 20 mg/dL — AB
Leukocytes,Ua: NEGATIVE
Nitrite: NEGATIVE
Protein, ur: 30 mg/dL — AB
Specific Gravity, Urine: 1.029 (ref 1.005–1.030)
pH: 5 (ref 5.0–8.0)

## 2018-10-13 LAB — CBC
HCT: 45 % (ref 39.0–52.0)
Hemoglobin: 16.2 g/dL (ref 13.0–17.0)
MCH: 29.6 pg (ref 26.0–34.0)
MCHC: 36 g/dL (ref 30.0–36.0)
MCV: 82.1 fL (ref 80.0–100.0)
Platelets: 154 10*3/uL (ref 150–400)
RBC: 5.48 MIL/uL (ref 4.22–5.81)
RDW: 11.8 % (ref 11.5–15.5)
WBC: 6.4 10*3/uL (ref 4.0–10.5)
nRBC: 0 % (ref 0.0–0.2)

## 2018-10-13 LAB — LIPASE, BLOOD: Lipase: 24 U/L (ref 11–51)

## 2018-10-13 LAB — TROPONIN I (HIGH SENSITIVITY): Troponin I (High Sensitivity): 3 ng/L (ref <18)

## 2018-10-13 MED ORDER — CIPROFLOXACIN HCL 500 MG PO TABS: 500.0000 mg | ORAL_TABLET | Freq: Two times a day (BID) | ORAL | 0 refills | Status: AC

## 2018-10-13 MED ORDER — SODIUM CHLORIDE 0.9% FLUSH
3.0000 mL | Freq: Once | INTRAVENOUS | Status: AC
  Administered 2018-10-13: 3 mL via INTRAVENOUS

## 2018-10-13 MED ORDER — METRONIDAZOLE 500 MG PO TABS: 500.0000 mg | ORAL_TABLET | Freq: Two times a day (BID) | ORAL | 0 refills | Status: DC

## 2018-10-13 MED ORDER — SODIUM CHLORIDE 0.9 % IV BOLUS
1000.0000 mL | Freq: Once | INTRAVENOUS | Status: AC
  Administered 2018-10-13: 1000 mL via INTRAVENOUS

## 2018-10-13 MED ORDER — ACETAMINOPHEN 325 MG PO TABS
650.0000 mg | ORAL_TABLET | Freq: Once | ORAL | Status: AC
  Administered 2018-10-13: 650 mg via ORAL
  Filled 2018-10-13: qty 2

## 2018-10-13 MED ORDER — ONDANSETRON HCL 4 MG/2ML IJ SOLN
4.0000 mg | Freq: Once | INTRAMUSCULAR | Status: AC
  Administered 2018-10-13: 4 mg via INTRAVENOUS
  Filled 2018-10-13: qty 2

## 2018-10-13 MED ORDER — IOHEXOL 300 MG/ML  SOLN
100.0000 mL | Freq: Once | INTRAMUSCULAR | Status: AC | PRN
  Administered 2018-10-13: 100 mL via INTRAVENOUS

## 2018-10-13 MED ORDER — KETOROLAC TROMETHAMINE 30 MG/ML IJ SOLN
30.0000 mg | Freq: Once | INTRAMUSCULAR | Status: AC
  Administered 2018-10-13: 30 mg via INTRAVENOUS
  Filled 2018-10-13: qty 1

## 2018-10-13 MED ORDER — ONDANSETRON 4 MG PO TBDP: 4.0000 mg | ORAL_TABLET | Freq: Three times a day (TID) | ORAL | 0 refills | Status: DC | PRN

## 2018-10-13 NOTE — ED Notes (Signed)
Patient transported to CT 

## 2018-10-13 NOTE — ED Provider Notes (Signed)
[facility] Emergency Department Provider Note  ____________________________________________  Time seen: Approximately 2:38 PM  I have reviewed the triage vital signs and the nursing notes.   HISTORY  Chief Complaint Abdominal Pain and Fever    HPI Robert Franklin is a 53 y.o. male who presents the emergency department with multiple complaints.  Patient reports rather sudden onset of fevers and chills, body aches, nausea, vomiting, diarrhea, diffuse abdominal pain, chest pain.  Patient reports that he has a history of  diverticulitis and has had an appendectomy and cholecystectomy.  Patient initially thought it may have "been something that he ate."  Patient denies any food out of the ordinary.  When patient developed other symptoms he felt that it was not food related and presents the emergency department for evaluation.  He does have a history of diverticulitis but has never had any GI abscess from any diverticulum.  Patient did had complications from his appendectomy but recovered well from same.  No medications from same.  Patient has a history of diabetes, abnormal LFTs, GERD, hypertension, GI ulcer, sleep apnea.         Past Medical History:  Diagnosis Date  . Coronary artery disease, non-occlusive 02/28/2008   Dr. Humphrey Rolls @ [facility]: 40-50% LCx lesion by cath,  EF 60% with normal wall motion.  . Diabetes mellitus without complication ([place])   . Essential hypertension   . GERD (gastroesophageal reflux disease)   . Headache   . High triglycerides     was previously on treatment, but  he notes that this was stopped  . Kidney stones   . Moderate obesity 05/29/2014  . OSA treated with BiPAP   . Short bowel syndrome 1994    Patient Active Problem List   Diagnosis Date Noted  . Central adiposity 08/10/2018  . DM (diabetes mellitus) ([place]) 07/08/2018  . Medication management 01/01/2016  . Hypertriglyceridemia without hypercholesterolemia 02/26/2015  .  Abnormally low high density lipoprotein (HDL) cholesterol with hypertriglyceridemia 02/26/2015  . Atrial premature contractions 02/26/2015  . Metabolic syndrome [id number]  . Rapid heart beat 02/26/2015  . Signs and symptoms involving emotional state 05/29/2014  . Anxiety 05/29/2014  . Adult BMI 30+ 05/29/2014  . Clinical depression 05/29/2014  . Failure of erection 05/29/2014  . Abnormal LFTs 05/29/2014  . Blood glucose elevated 05/29/2014  . GERD (gastroesophageal reflux disease) 05/29/2014  . Combined fat and carbohydrate induced hyperlipemia 05/29/2014  . Essential hypertension 05/29/2014  . Headache, migraine 05/29/2014  . Obesity, Class II, BMI 35-39.9 05/29/2014  . Gastroduodenal ulcer 05/29/2014  . Apnea, sleep 05/29/2014  . Umbilical hernia without obstruction and without gangrene 05/29/2014  . Avitaminosis D 09/14/2009  . Arteriosclerosis of coronary artery 02/27/2009    Past Surgical History:  Procedure Laterality Date  . "stomach wrap"  1991   GERD  . APPENDECTOMY    . CARDIAC CATHETERIZATION  02/2008   Dr. Humphrey Rolls @ [facility]: 40-50% LCx lesion by cath,  EF 60% with normal wall motion.  [name] Nuclear Stress Test   January 2010    heart rate increased from 80-158 BPM. No EKG changes.  there is a moderate sized  reversible defect in the inferior and inferior lateral wall as well as anteroseptal. EF 59%.  . CardioNet Holter Monitor   January 2010    sinus rhythm. Rare PVCs in setting was. Current PACs , seen in singles and pairs. Max heart rate 127, minimum heart rate 57. Average 84.  . CHOLECYSTECTOMY    .  COLON SURGERY     due to gangrenous appendix  . COLONOSCOPY WITH PROPOFOL N/A 04/26/2016   Procedure: COLONOSCOPY WITH PROPOFOL;  Surgeon: Jonathon Bellows, MD;  Location: [facility] ENDOSCOPY;  Service: Endoscopy;  Laterality: N/A;  . HERNIA REPAIR  2010  . INNER EAR SURGERY    . TONSILLECTOMY    . TYMPANOSTOMY TUBE PLACEMENT Bilateral 1973    Prior to Admission medications    Medication Sig Start Date End Date Taking? Authorizing Provider  butalbital-acetaminophen-caffeine (FIORICET) 50-325-40 MG tablet TAKE 1 TABLET BY MOUTH 2 (TWO) TIMES DAILY AS NEEDED FOR HEADACHE. 09/06/18   [name]., MD  CIALIS 20 MG tablet TAKE 0.5 TABLETS (10 MG TOTAL) BY MOUTH DAILY AS NEEDED FOR ERECTILE DYSFUNCTION. 02/18/18   [name]., MD  ciprofloxacin (CIPRO) 500 MG tablet Take 1 tablet (500 mg total) by mouth 2 (two) times daily for 7 days. 10/13/18 10/20/18  Michale Emmerich, Charline Bills, PA-C  fenofibrate 160 MG tablet Take 1 tablet (160 mg total) by mouth daily. 02/14/18   Leonie Man, MD  hydrochlorothiazide (HYDRODIURIL) 25 MG tablet TAKE 1 TABLET BY MOUTH EVERY DAY WITH LOSARTAN 07/31/17   [name]., MD  Icosapent Ethyl (VASCEPA) 1 g CAPS Take 2 capsules (2 g total) by mouth 2 (two) times daily. 02/01/18   Leonie Man, MD  losartan (COZAAR) 100 MG tablet TAKE 1 TABLET BY MOUTH EVERY DAY. TAKE WITH HCTZ 07/14/18   [name]., MD  metFORMIN (GLUCOPHAGE) 1000 MG tablet TAKE 1 TABLET (1,000 MG TOTAL) BY MOUTH 2 (TWO) TIMES DAILY WITH A MEAL. 09/06/18   [name]., MD  metoprolol succinate (TOPROL-XL) 100 MG 24 hr tablet TAKE 1 TABLET BY MOUTH EVERY DAY 07/10/18   [name]., MD  metroNIDAZOLE (FLAGYL) 500 MG tablet Take 1 tablet (500 mg total) by mouth 2 (two) times daily. 10/13/18   Roseland Braun, Charline Bills, PA-C  omeprazole (PRILOSEC OTC) 20 MG tablet Take 1 tablet (20 mg total) by mouth daily. 05/14/15   [name]., MD  ondansetron (ZOFRAN) 8 MG tablet Take 1 tablet (8 mg total) by mouth every 8 (eight) hours as needed for nausea or vomiting. 03/02/18   Carmon Ginsberg, PA  ondansetron (ZOFRAN-ODT) 4 MG disintegrating tablet Take 1 tablet (4 mg total) by mouth every 8 (eight) hours as needed for nausea or vomiting. 10/13/18   Jacquie Lukes, Charline Bills, PA-C  promethazine-dextromethorphan (PROMETHAZINE-DM) 6.25-15  MG/5ML syrup Take 5 mLs by mouth 4 (four) times daily as needed for cough. 02/26/18   Carmon Ginsberg, PA  rosuvastatin (CRESTOR) 20 MG tablet Take 1 tablet (20 mg total) by mouth daily. 06/05/18 09/03/18  Leonie Man, MD  VASCEPA 1 g CAPS  06/16/18   [provider]    Allergies Mucinex [guaifenesin er]  Family History  Problem Relation Age of Onset  . Hypertension Mother   . Irritable bowel syndrome Mother   . Migraines Mother   . Lumbar disc disease Mother   . Heart attack Mother   . Heart attack Maternal Uncle   . Heart attack Maternal Grandfather     Social History Social History   Tobacco Use  . Smoking status: Former Smoker    Packs/day: 1.00    Years: 28.00    Pack years: 28.00    Types: Cigarettes    Quit date: 12/24/1996    Years since quitting: 21.8  . Smokeless tobacco: Never Used  Substance Use Topics  . Alcohol use: Never    Frequency: Never    Comment: seldom  . Drug use: No     Review of Systems  Constitutional: No fever/chills Eyes: No visual changes. No discharge ENT: No upper respiratory complaints. Cardiovascular: no chest pain. Respiratory: no cough. No SOB. Gastrointestinal: Positive for diffuse abdominal pain with nausea, vomiting, diarrhea.  No constipation. Musculoskeletal: Negative for musculoskeletal pain. Skin: Negative for rash, abrasions, lacerations, ecchymosis. Neurological: Negative for headaches, focal weakness or numbness. 10-point ROS otherwise negative.  ____________________________________________   PHYSICAL EXAM:  VITAL SIGNS: ED Triage Vitals  Enc Vitals Group     BP 10/13/18 1408 (!) 186/110     Pulse Rate 10/13/18 1408 (!) 117     Resp 10/13/18 1408 20     Temp 10/13/18 1408 (!) 100.6 F (38.1 C)     Temp Source 10/13/18 1408 Oral     SpO2 10/13/18 1408 95 %     Weight 10/13/18 1409 246 lb (111.6 kg)     Height 10/13/18 1409 5\' 9"  (1.753 m)     Head Circumference --      Peak Flow --      Pain  Score 10/13/18 1409 5     Pain Loc --      Pain Edu? --      Excl. in [place]? --      Constitutional: Alert and oriented. Well appearing and in no acute distress. Eyes: Conjunctivae are normal. PERRL. EOMI. Head: Atraumatic. ENT:      Ears:       Nose: No congestion/rhinnorhea.      Mouth/Throat: Mucous membranes are moist.  Oropharynx nonerythematous and nonedematous.  Uterus midline. Neck: No stridor.   Hematological/Lymphatic/Immunilogical: No cervical lymphadenopathy. Cardiovascular: Normal rate, regular rhythm. Normal S1 and S2.  Good peripheral circulation. Respiratory: Normal respiratory effort without tachypnea or retractions. Lungs CTAB. Good air entry to the bases with no decreased or absent breath sounds. Gastrointestinal: Bowel sounds 4 quadrants.  Soft to palpation all quadrants.  Minimally tender to palpation diffusely throughout the abdomen without point specific tenderness.  No guarding or rigidity. No palpable masses. No distention. No CVA tenderness. Musculoskeletal: Full range of motion to all extremities. No gross deformities appreciated. Neurologic:  Normal speech and language. No gross focal neurologic deficits are appreciated.  Skin:  Skin is warm, dry and intact. No rash noted. Psychiatric: Mood and affect are normal. Speech and behavior are normal. Patient exhibits appropriate insight and judgement.   ____________________________________________   LABS (all labs ordered are listed, but only abnormal results are displayed)  Labs Reviewed  COMPREHENSIVE METABOLIC PANEL - Abnormal; Notable for the following components:      Result Value   Glucose, Bld 296 (*)    Total Bilirubin 1.4 (*)    All other components within normal limits  URINALYSIS, COMPLETE (UACMP) WITH MICROSCOPIC - Abnormal; Notable for the following components:   Color, Urine YELLOW (*)    APPearance CLEAR (*)    Glucose, UA >=500 (*)    Ketones, ur 20 (*)    Protein, ur 30 (*)    All other  components within normal limits  SARS CORONAVIRUS 2 (TAT 6-24 HRS)  LIPASE, BLOOD  CBC  TROPONIN I (HIGH SENSITIVITY)   ____________________________________________  EKG  ED ECG REPORT I, Charline Bills Keryn Nessler,  personally viewed and interpreted this ECG.   Date: 10/13/2018  EKG Time: 1530 hrs.  Rate: 100 bpm  Rhythm: unchanged from  previous tracings, sinus tachycardia  Axis: Borderline left axis deviation  Intervals:none  ST&T Change: No ST elevation or depression noted  Sinus Tachycardia. No STEMI  ____________________________________________  RADIOLOGY I personally viewed and evaluated these images as part of my medical decision making, as well as reviewing the written report by the radiologist.  Ct Abdomen Pelvis W Contrast  Result Date: 10/13/2018 CLINICAL DATA:  Abdominal pain, nausea, vomiting, diarrhea. History of diverticulitis EXAM: CT ABDOMEN AND PELVIS WITH CONTRAST TECHNIQUE: Multidetector CT imaging of the abdomen and pelvis was performed using the standard protocol following bolus administration of intravenous contrast. CONTRAST:  125mL OMNIPAQUE IOHEXOL 300 MG/ML  SOLN COMPARISON:  None. FINDINGS: Lower chest: No acute abnormality. Hepatobiliary: No focal liver abnormality is seen. Status post cholecystectomy. No biliary dilatation. Pancreas: Unremarkable. No pancreatic ductal dilatation or surrounding inflammatory changes. Spleen: 9 mm low-density lesion at the superior pole of the spleen, too small to definitively characterize but most likely represents cyst versus hemangioma. Two splenules adjacent to the spleen are incidentally noted. Spleen otherwise unremarkable. Adrenals/Urinary Tract: Unremarkable adrenal glands. 6 cm simple inferior pole right renal cyst. 1.5 cm simple left renal cyst in the inferior pole. Additional smaller low-density lesions within each kidney, too small to definitively characterize, but most likely represent cysts. No hydronephrosis. Ureters  unremarkable. Urinary bladder unremarkable. Stomach/Bowel: Stomach and small bowel are unremarkable. No dilated loops of bowel. There are a few scattered colonic diverticula. There is borderline long segment thickening of the sigmoid colon and rectum. No pericolonic inflammatory changes. No free fluid. No fluid collections or free air. Appendix not visualized and may be surgically absent. Vascular/Lymphatic: Aortoiliac calcified atherosclerotic disease. No aneurysm. No abdominopelvic lymphadenopathy. Reproductive: Prostate is unremarkable. Other: Fat containing right inguinal hernia. Small fat containing umbilical hernia. No ascites. Musculoskeletal: No acute or significant osseous findings. IMPRESSION: Mild long segment wall thickening involving the rectum and sigmoid colon suggesting a nonspecific colitis. There are a few scattered colonic diverticula without focally inflamed diverticulum to suggest acute diverticulitis. Electronically Signed   By: Davina Poke M.D.   On: 10/13/2018 15:40    ____________________________________________    PROCEDURES  Procedure(s) performed:    Procedures    Medications  ketorolac (TORADOL) 30 MG/ML injection 30 mg (has no administration in time range)  acetaminophen (TYLENOL) tablet 650 mg (has no administration in time range)  sodium chloride flush (NS) 0.9 % injection 3 mL (3 mLs Intravenous Given 10/13/18 1526)  sodium chloride 0.9 % bolus 1,000 mL (1,000 mLs Intravenous New Bag/Given 10/13/18 1526)  ondansetron (ZOFRAN) injection 4 mg (4 mg Intravenous Given 10/13/18 1525)  iohexol (OMNIPAQUE) 300 MG/ML solution 100 mL (100 mLs Intravenous Contrast Given 10/13/18 1511)     ____________________________________________   INITIAL IMPRESSION / ASSESSMENT AND PLAN / ED COURSE  Pertinent labs & imaging results that were available during my care of the patient were reviewed by me and considered in my medical decision making (see chart for  details).  Review of the [place] CSRS was performed in accordance of the [place] prior to dispensing any controlled drugs.  Clinical Course as of Oct 13 1618  Sat Oct 13, 2018  1453 Patient presented to the emergency department multiple complaints.  Patient had body aches, fevers and chills, nausea vomiting and diarrhea, abdominal pain.  Patient had diffuse mild tenderness to palpation but no sharp tenderness to palpation.  No 1 area was worse than the other.  Given patient's symptoms, differential includes food poisoning, viral gastroenteritis, gastric ulcer,  pancreatitis, diverticulitis, COVID-19, ACS.  Patient will be evaluated with labs, imaging, EKG at this time.   [JC]    Clinical Course User Index [JC] Kemari Narez, Charline Bills, PA-C       The patient was evaluated for the symptoms described in the history of present illness. The patient was evaluated in the context of the global COVID-19 pandemic, which necessitated consideration that the patient might be at risk for infection with the SARS-CoV-2 virus that causes COVID-19. Institutional protocols and algorithms that pertain to the evaluation of patients at risk for COVID-19 are in a state of rapid change based on information released by regulatory bodies including the CDC and federal and state organizations. The most current policies and algorithms were followed during the patient's care in the ED.   Patient's diagnosis is consistent with colitis.  Patient presented to the emergency department with multiple complaints.  Symptoms began rather rapidly yesterday.  Patient has a long GI history and has had a cholecystectomy and appendectomy.  Patient was complaining of fevers, chills, nausea, vomiting, diarrhea, diffuse abdominal cramping.  Patient was febrile on arrival at 100.6 F and minimally tach at 104 bpm.  On exam, exam was reassuring.  Labs, imaging are reassuring at this time.  No significant elevated white blood cell count.  Patient's blood  glucose is slightly up but patient states that this has been his normal recently and he is seeing his primary care to improve diabetic hyperglycemia.  Given findings, concern for colitis exists.  No diverticulitis on CT.  Patient be treated with Cipro, Flagyl and Zofran at home.  Given the sudden onset of the symptoms, concern also exists for COVID-19, however this is less likely than colitis.  Patient will be tested at this time.  Of instructed the patient to stop the antibiotics if COVID-19 test returns positive.  Plenty of fluids.  Patient is to also take a probiotic while taking the antibiotics.  Follow-up primary care as needed.  Return precautions are discussed with the patient.. Patient is given ED precautions to return to the ED for any worsening or new symptoms.     ____________________________________________  FINAL CLINICAL IMPRESSION(S) / ED DIAGNOSES  Final diagnoses:  Colitis      NEW MEDICATIONS STARTED DURING THIS VISIT:  ED Discharge Orders         Ordered    metroNIDAZOLE (FLAGYL) 500 MG tablet  2 times daily     10/13/18 1613    ciprofloxacin (CIPRO) 500 MG tablet  2 times daily     10/13/18 1613    ondansetron (ZOFRAN-ODT) 4 MG disintegrating tablet  Every 8 hours PRN     10/13/18 1613              This chart was dictated using voice recognition software/Dragon. Despite best efforts to proofread, errors can occur which can change the meaning. Any change was purely unintentional.    Darletta Moll, PA-C 10/13/18 1620    Harvest Dark, MD 10/14/18 1231

## 2018-10-13 NOTE — ED Triage Notes (Signed)
Abdominal pain, diarrhea, headache since yesterday. Burning with urination.

## 2018-10-13 NOTE — ED Notes (Addendum)
Pt c/o of a pounding headache. This nurse dimmed the lights and asked if he would like something for his pain, he said yes. PA notified.

## 2018-10-14 LAB — SARS CORONAVIRUS 2 (TAT 6-24 HRS): SARS Coronavirus 2: NEGATIVE

## 2018-10-16 ENCOUNTER — Encounter: Payer: Self-pay | Admitting: Physician Assistant

## 2018-10-16 ENCOUNTER — Telehealth: Payer: Commercial Managed Care - PPO | Admitting: Physician Assistant

## 2018-10-16 DIAGNOSIS — R531 Weakness: Secondary | ICD-10-CM

## 2018-10-16 DIAGNOSIS — R197 Diarrhea, unspecified: Secondary | ICD-10-CM

## 2018-10-16 NOTE — Progress Notes (Signed)
We are sorry that you are not feeling well.  Here is how we plan to help!  Based on what you have shared with me it looks like you have Acute Infectious Diarrhea.  Most cases of acute diarrhea are due to infections with virus and bacteria and are self-limited conditions lasting less than 14 days.  I reviewed your ED visit notes and workup from 10/13/18. I would encourage you to continue taking the antibiotics prescribed to completion. Common side effects of antibiotics include nausea and diarrhea. You can offset some of this abdominal discomfort by taking a probiotic or eating yogurt daily with the antibiotic.   Please drink lots of fluids and get plenty of rest. I recommend at least 8 glasses of water daily. You can sip slowly, every few minutes for 8 hours a day to ensure proper hydration.   BE AWARE that some antibiotics can cause a C. Difficile infection, a different type of bowel infection that can become serious. If you have significantly worsening diarrhea, abdominal pain, fevers, and/or vomiting, please seek immediate medical evaluation.   I have extended your work note to stay at home for another few days. Please seek in-person evaluation if you are not improving or if you worsen within the next 48 hours.    HOME CARE  We recommend changing your diet to help with your symptoms for the next few days.  Drink plenty of fluids that contain water salt and sugar. Sports drinks such as Gatorade may help.   You may try broths, soups, bananas, applesauce, soft breads, mashed potatoes or crackers.   You are considered infectious for as long as the diarrhea continues. Hand washing or use of alcohol based hand sanitizers is recommend.  It is best to stay out of work or school until your symptoms stop.   GET HELP RIGHT AWAY  If you have dark yellow colored urine or do not pass urine frequently you should drink more fluids.    If your symptoms worsen   If you feel like you are going to  pass out (faint)  You have a new problem  MAKE SURE YOU   Understand these instructions.  Will watch your condition.  Will get help right away if you are not doing well or get worse.  Your e-visit answers were reviewed by a board certified advanced clinical practitioner to complete your personal care plan.  Depending on the condition, your plan could have included both over the counter or prescription medications.  If there is a problem please reply  once you have received a response from your provider.  Your safety is important to [place].  If you have drug allergies check your prescription carefully.    You can use MyChart to ask questions about today's visit, request a non-urgent call back, or ask for a work or school excuse for 24 hours related to this e-Visit. If it has been greater than 24 hours you will need to follow up with your provider, or enter a new e-Visit to address those concerns.   You will get an e-mail in the next two days asking about your experience.  I hope that your e-visit has been valuable and will speed your recovery. Thank you for using e-visits.  Greater than 5 minutes, yet less than 10 minutes of time have been spent researching, coordinating, and implementing care for this patient today.

## 2018-10-17 ENCOUNTER — Emergency Department
Admission: EM | Admit: 2018-10-17 | Discharge: 2018-10-17 | Disposition: A | Discharge status: Home / Self Care | Payer: Commercial Managed Care - PPO | Attending: Student | Admitting: Student

## 2018-10-17 ENCOUNTER — Other Ambulatory Visit: Payer: Self-pay

## 2018-10-17 ENCOUNTER — Encounter: Payer: Self-pay | Admitting: Emergency Medicine

## 2018-10-17 ENCOUNTER — Telehealth: Payer: Self-pay | Admitting: Family Medicine

## 2018-10-17 DIAGNOSIS — I2510 Atherosclerotic heart disease of native coronary artery without angina pectoris: Secondary | ICD-10-CM | POA: Insufficient documentation

## 2018-10-17 DIAGNOSIS — R197 Diarrhea, unspecified: Secondary | ICD-10-CM | POA: Diagnosis not present

## 2018-10-17 DIAGNOSIS — R1084 Generalized abdominal pain: Secondary | ICD-10-CM | POA: Diagnosis not present

## 2018-10-17 DIAGNOSIS — Z7984 Long term (current) use of oral hypoglycemic drugs: Secondary | ICD-10-CM | POA: Diagnosis not present

## 2018-10-17 DIAGNOSIS — I10 Essential (primary) hypertension: Secondary | ICD-10-CM | POA: Insufficient documentation

## 2018-10-17 DIAGNOSIS — E119 Type 2 diabetes mellitus without complications: Secondary | ICD-10-CM | POA: Diagnosis not present

## 2018-10-17 DIAGNOSIS — Z87891 Personal history of nicotine dependence: Secondary | ICD-10-CM | POA: Insufficient documentation

## 2018-10-17 DIAGNOSIS — Z79899 Other long term (current) drug therapy: Secondary | ICD-10-CM | POA: Insufficient documentation

## 2018-10-17 LAB — COMPREHENSIVE METABOLIC PANEL
ALT: 79 U/L — ABNORMAL HIGH (ref 0–44)
AST: 107 U/L — ABNORMAL HIGH (ref 15–41)
Albumin: 4.2 g/dL (ref 3.5–5.0)
Alkaline Phosphatase: 48 U/L (ref 38–126)
Anion gap: 10 (ref 5–15)
BUN: 21 mg/dL — ABNORMAL HIGH (ref 6–20)
CO2: 26 mmol/L (ref 22–32)
Calcium: 9.3 mg/dL (ref 8.9–10.3)
Chloride: 106 mmol/L (ref 98–111)
Creatinine, Ser: 0.91 mg/dL (ref 0.61–1.24)
GFR calc Af Amer: >60 mL/min (ref >60)
GFR calc non Af Amer: >60 mL/min (ref >60)
Glucose, Bld: 182 mg/dL — ABNORMAL HIGH (ref 70–99)
Potassium: 3.8 mmol/L (ref 3.5–5.1)
Sodium: 142 mmol/L (ref 135–145)
Total Bilirubin: 1.2 mg/dL (ref 0.3–1.2)
Total Protein: 7.6 g/dL (ref 6.5–8.1)

## 2018-10-17 LAB — CBC
HCT: 41.7 % (ref 39.0–52.0)
Hemoglobin: 15.2 g/dL (ref 13.0–17.0)
MCH: 29.7 pg (ref 26.0–34.0)
MCHC: 36.5 g/dL — ABNORMAL HIGH (ref 30.0–36.0)
MCV: 81.4 fL (ref 80.0–100.0)
Platelets: 200 10*3/uL (ref 150–400)
RBC: 5.12 MIL/uL (ref 4.22–5.81)
RDW: 12 % (ref 11.5–15.5)
WBC: 6.4 10*3/uL (ref 4.0–10.5)
nRBC: 0 % (ref 0.0–0.2)

## 2018-10-17 LAB — LIPASE, BLOOD: Lipase: 57 U/L — ABNORMAL HIGH (ref 11–51)

## 2018-10-17 LAB — C DIFFICILE QUICK SCREEN W PCR REFLEX
C Diff antigen: NEGATIVE
C Diff interpretation: NOT DETECTED
C Diff toxin: NEGATIVE

## 2018-10-17 MED ORDER — SODIUM CHLORIDE 0.9 % IV BOLUS
2000.0000 mL | Freq: Once | INTRAVENOUS | Status: AC
  Administered 2018-10-17: 2000 mL via INTRAVENOUS

## 2018-10-17 MED ORDER — DICYCLOMINE HCL 10 MG PO CAPS
20.0000 mg | ORAL_CAPSULE | Freq: Once | ORAL | Status: AC
  Administered 2018-10-17: 20 mg via ORAL
  Filled 2018-10-17: qty 2

## 2018-10-17 MED ORDER — SODIUM CHLORIDE 0.9 % IV BOLUS: 1000.0000 mL | Freq: Once | INTRAVENOUS | Status: DC

## 2018-10-17 MED ORDER — METRONIDAZOLE IN NACL 5-0.79 MG/ML-% IV SOLN
500.0000 mg | Freq: Once | INTRAVENOUS | Status: AC
  Administered 2018-10-17: 500 mg via INTRAVENOUS
  Filled 2018-10-17: qty 100

## 2018-10-17 MED ORDER — DICYCLOMINE HCL 20 MG PO TABS: 20.0000 mg | ORAL_TABLET | Freq: Four times a day (QID) | ORAL | 0 refills | Status: DC | PRN

## 2018-10-17 MED ORDER — CIPROFLOXACIN IN D5W 400 MG/200ML IV SOLN
400.0000 mg | Freq: Once | INTRAVENOUS | Status: AC
  Administered 2018-10-17: 400 mg via INTRAVENOUS
  Filled 2018-10-17: qty 200

## 2018-10-17 MED ORDER — ONDANSETRON HCL 4 MG PO TABS: 4.0000 mg | ORAL_TABLET | Freq: Three times a day (TID) | ORAL | 0 refills | Status: AC | PRN

## 2018-10-17 MED ORDER — ONDANSETRON HCL 4 MG/2ML IJ SOLN
4.0000 mg | Freq: Once | INTRAMUSCULAR | Status: AC
  Administered 2018-10-17: 4 mg via INTRAVENOUS
  Filled 2018-10-17: qty 2

## 2018-10-17 NOTE — Telephone Encounter (Signed)
Please review ER note from 10/13/18.

## 2018-10-17 NOTE — Telephone Encounter (Signed)
Pt's mom called saying Robert Franklin went to the ER this weekend with diarrhea.  He was diagnosed with Colitis.  He cant eat or drink anything with out it going straight through him.  He was given Ciprofloxacin and metronidazole in the ER .  Please advise  [phone]  Con Memos

## 2018-10-17 NOTE — Discharge Instructions (Signed)
Thank you for letting us take care of you in the emergency department today.   Please continue to take any regular, prescribed medications.   New medications we have prescribed:  - Bentyl - to help with abdominal cramping - Zofran - to help with nausea, vomiting  Please follow up with: - Your primary care doctor to review your ER visit and follow up on your symptoms.   Please return to the ER for any new or worsening symptoms.

## 2018-10-17 NOTE — ED Notes (Signed)
Pt and Pt's mother verbalized understanding of discharge instructions with MD Monks at bedside. Pt is NAD at this time.

## 2018-10-17 NOTE — Telephone Encounter (Signed)
I would recommend COVID testing and clear liquid diet and advancing to full liquid then bland and regular.  If abdominal pain pain returns he may need to be examined. We  Can write him out of work for the rest of the week.

## 2018-10-17 NOTE — ED Triage Notes (Signed)
PT reports he was diagnosed with colitis Friday but has had no ability to retain po intake. Pt reports 12 episodes of diarrhea in the last 24 hours and expresses concerns over possible dehydration.

## 2018-10-17 NOTE — ED Provider Notes (Signed)
[facility] Emergency Department Provider Note  ____________________________________________   First MD Initiated Contact with Patient 10/17/18 1937     (approximate)  I have reviewed the triage vital signs and the nursing notes.  History  Chief Complaint Diarrhea    HPI Robert Franklin is a 53 y.o. male with medical history as below who presents emergency department for continued diarrhea.  Patient was recently seen and evaluated here for diarrhea and abdominal pain on 9/19.  He was diagnosed with colitis, and started on ciprofloxacin and Flagyl.  He states despite compliance with his medications, he continues to have diarrhea.  He states every time he tries to take PO he has to have a bowel movement.  He says he has had upwards of 12 episodes of loose stool today.  His diarrhea is nonbloody.  He denies any vomiting and has been able to keep down his medications.  No longer having fevers.  He denies any difficulty breathing, shortness of breath, or cough.  He did have a COVID swab on 9/19 which was negative.  Reports some abdominal cramping, discomfort that is generalized and diffuse.  No focal or localizing tenderness.  He states prior to the antibiotics prescribed on 9/19, he was not on any recent antibiotics.  No new foods or travel.   Past Medical Hx Past Medical History:  Diagnosis Date  . Coronary artery disease, non-occlusive 02/28/2008   Dr. Humphrey Rolls @ [facility]: 40-50% LCx lesion by cath,  EF 60% with normal wall motion.  . Diabetes mellitus without complication ([place])   . Essential hypertension   . GERD (gastroesophageal reflux disease)   . Headache   . High triglycerides     was previously on treatment, but  he notes that this was stopped  . Kidney stones   . Moderate obesity 05/29/2014  . OSA treated with BiPAP   . Short bowel syndrome 1994    Problem List Patient Active Problem List   Diagnosis Date Noted  . Central adiposity 08/10/2018  .  DM (diabetes mellitus) ([place]) 07/08/2018  . Medication management 01/01/2016  . Hypertriglyceridemia without hypercholesterolemia 02/26/2015  . Abnormally low high density lipoprotein (HDL) cholesterol with hypertriglyceridemia 02/26/2015  . Atrial premature contractions 02/26/2015  . Metabolic syndrome [id number]  . Rapid heart beat 02/26/2015  . Signs and symptoms involving emotional state 05/29/2014  . Anxiety 05/29/2014  . Adult BMI 30+ 05/29/2014  . Clinical depression 05/29/2014  . Failure of erection 05/29/2014  . Abnormal LFTs 05/29/2014  . Blood glucose elevated 05/29/2014  . GERD (gastroesophageal reflux disease) 05/29/2014  . Combined fat and carbohydrate induced hyperlipemia 05/29/2014  . Essential hypertension 05/29/2014  . Headache, migraine 05/29/2014  . Obesity, Class II, BMI 35-39.9 05/29/2014  . Gastroduodenal ulcer 05/29/2014  . Apnea, sleep 05/29/2014  . Umbilical hernia without obstruction and without gangrene 05/29/2014  . Avitaminosis D 09/14/2009  . Arteriosclerosis of coronary artery 02/27/2009    Past Surgical Hx Past Surgical History:  Procedure Laterality Date  . "stomach wrap"  1991   GERD  . APPENDECTOMY    . CARDIAC CATHETERIZATION  02/2008   Dr. Humphrey Rolls @ [facility]: 40-50% LCx lesion by cath,  EF 60% with normal wall motion.  [name] Nuclear Stress Test   January 2010    heart rate increased from 80-158 BPM. No EKG changes.  there is a moderate sized  reversible defect in the inferior and inferior lateral wall as well as anteroseptal. EF 59%.  Marland Kitchen  CardioNet Holter Monitor   January 2010    sinus rhythm. Rare PVCs in setting was. Current PACs , seen in singles and pairs. Max heart rate 127, minimum heart rate 57. Average 84.  . CHOLECYSTECTOMY    . COLON SURGERY     due to gangrenous appendix  . COLONOSCOPY WITH PROPOFOL N/A 04/26/2016   Procedure: COLONOSCOPY WITH PROPOFOL;  Surgeon: Jonathon Bellows, MD;  Location: [facility] ENDOSCOPY;  Service: Endoscopy;   Laterality: N/A;  . HERNIA REPAIR  2010  . INNER EAR SURGERY    . TONSILLECTOMY    . TYMPANOSTOMY TUBE PLACEMENT Bilateral 1973    Medications Prior to Admission medications   Medication Sig Start Date End Date Taking? Authorizing Provider  butalbital-acetaminophen-caffeine (FIORICET) 50-325-40 MG tablet TAKE 1 TABLET BY MOUTH 2 (TWO) TIMES DAILY AS NEEDED FOR HEADACHE. 09/06/18   [name]., MD  CIALIS 20 MG tablet TAKE 0.5 TABLETS (10 MG TOTAL) BY MOUTH DAILY AS NEEDED FOR ERECTILE DYSFUNCTION. 02/18/18   [name]., MD  ciprofloxacin (CIPRO) 500 MG tablet Take 1 tablet (500 mg total) by mouth 2 (two) times daily for 7 days. 10/13/18 10/20/18  Cuthriell, Charline Bills, PA-C  fenofibrate 160 MG tablet Take 1 tablet (160 mg total) by mouth daily. 02/14/18   Leonie Man, MD  hydrochlorothiazide (HYDRODIURIL) 25 MG tablet TAKE 1 TABLET BY MOUTH EVERY DAY WITH LOSARTAN 07/31/17   [name]., MD  Icosapent Ethyl (VASCEPA) 1 g CAPS Take 2 capsules (2 g total) by mouth 2 (two) times daily. 02/01/18   Leonie Man, MD  losartan (COZAAR) 100 MG tablet TAKE 1 TABLET BY MOUTH EVERY DAY. TAKE WITH HCTZ 07/14/18   [name]., MD  metFORMIN (GLUCOPHAGE) 1000 MG tablet TAKE 1 TABLET (1,000 MG TOTAL) BY MOUTH 2 (TWO) TIMES DAILY WITH A MEAL. 09/06/18   [name]., MD  metoprolol succinate (TOPROL-XL) 100 MG 24 hr tablet TAKE 1 TABLET BY MOUTH EVERY DAY 07/10/18   [name]., MD  metroNIDAZOLE (FLAGYL) 500 MG tablet Take 1 tablet (500 mg total) by mouth 2 (two) times daily. 10/13/18   Cuthriell, Charline Bills, PA-C  omeprazole (PRILOSEC OTC) 20 MG tablet Take 1 tablet (20 mg total) by mouth daily. 05/14/15   [name]., MD  ondansetron (ZOFRAN) 8 MG tablet Take 1 tablet (8 mg total) by mouth every 8 (eight) hours as needed for nausea or vomiting. 03/02/18   Carmon Ginsberg, PA  ondansetron (ZOFRAN-ODT) 4 MG disintegrating tablet Take 1  tablet (4 mg total) by mouth every 8 (eight) hours as needed for nausea or vomiting. 10/13/18   Cuthriell, Charline Bills, PA-C  promethazine-dextromethorphan (PROMETHAZINE-DM) 6.25-15 MG/5ML syrup Take 5 mLs by mouth 4 (four) times daily as needed for cough. 02/26/18   Carmon Ginsberg, PA  rosuvastatin (CRESTOR) 20 MG tablet Take 1 tablet (20 mg total) by mouth daily. 06/05/18 09/03/18  Leonie Man, MD  VASCEPA 1 g CAPS  06/16/18   [provider]    Allergies Mucinex [guaifenesin er]  Family Hx Family History  Problem Relation Age of Onset  . Hypertension Mother   . Irritable bowel syndrome Mother   . Migraines Mother   . Lumbar disc disease Mother   . Heart attack Mother   . Heart attack Maternal Uncle   . Heart attack Maternal Grandfather     Social Hx Social History   Tobacco Use  .  Smoking status: Former Smoker    Packs/day: 1.00    Years: 28.00    Pack years: 28.00    Types: Cigarettes    Quit date: 12/24/1996    Years since quitting: 21.8  . Smokeless tobacco: Never Used  Substance Use Topics  . Alcohol use: Never    Frequency: Never    Comment: seldom  . Drug use: No     Review of Systems  Constitutional: Negative for fever, chills. Eyes: Negative for visual changes. ENT: Negative for sore throat. Cardiovascular: Negative for chest pain. Respiratory: Negative for shortness of breath. Gastrointestinal: + diarrhea Genitourinary: Negative for dysuria. Musculoskeletal: Negative for leg swelling. Skin: Negative for rash. Neurological: Negative for for headaches.   Physical Exam  Vital Signs: ED Triage Vitals  Enc Vitals Group     BP 10/17/18 1705 (!) 142/85     Pulse Rate 10/17/18 1705 75     Resp --      Temp 10/17/18 1705 98.3 F (36.8 C)     Temp Source 10/17/18 1705 Oral     SpO2 10/17/18 1705 99 %     Weight --      Height --      Head Circumference --      Peak Flow --      Pain Score 10/17/18 1719 8     Pain Loc --      Pain Edu?  --      Excl. in [place]? --     Constitutional: Alert and oriented.  Head: Normocephalic. Atraumatic. Eyes: Conjunctivae clear. Sclera anicteric. Nose: No congestion. No rhinorrhea. Mouth/Throat: Mucous membranes are slightly dry.  Neck: No stridor.   Cardiovascular: Normal rate, regular rhythm. No murmurs. Extremities well perfused. Respiratory: Normal respiratory effort.  Lungs CTAB. Gastrointestinal: Soft. Non-tender throughout. No rebound or guarding. Non-distended.  Musculoskeletal: No lower extremity edema. No deformities. Neurologic:  Normal speech and language. No gross focal neurologic deficits are appreciated.  Skin: Skin is warm, dry and intact. No rash noted. Psychiatric: Mood and affect are appropriate for situation.  EKG  N/A    Radiology  CT form ED visit on 9/19: IMPRESSION: Mild long segment wall thickening involving the rectum and sigmoid colon suggesting a nonspecific colitis. There are a few scattered colonic diverticula without focally inflamed diverticulum to suggest acute diverticulitis.   Procedures  Procedure(s) performed (including critical care):  Procedures   Initial Impression / Assessment and Plan / ED Course  53 y.o. male who presents to the ED for continued diarrhea, as above.  Ddx: symptoms of known colitis, other GI viral process, IBS.  Patient had a negative COVID swallow one 9/19.  Plan: labs initiated in triage without actionable derangements.  He has some slightly dry mucous membranes, but otherwise vitals are unremarkable, no tachycardia or hypotension suggestive of severe dehydration.  We will plan for fluids, symptom control, will give an IV dose of his antibiotics here.  If he is able to produce a stool specimen, will send for stool studies.  Throughout the patient's ED stay (approximately 6 hours), he produced 1 small stool specimen, sent for stool studies.  Negative for C. difficile.  He is feeling improved after supportive  care.  As such, will plan for discharge, given prescriptions for Bentyl and Zofran as needed.  Discussed return precautions.  Final Clinical Impression(s) / ED Diagnosis  Final diagnoses:  Diarrhea in adult patient       Note:  This document was prepared using Dragon  voice recognition software and may include unintentional dictation errors.   Lilia Pro., MD 10/17/18 2242

## 2018-10-17 NOTE — Telephone Encounter (Signed)
46- Mother called stating the patient was still having significant pain, unable to keep water in and getting very weak.  Advised they take him to the ER

## 2018-10-23 LAB — GI PATHOGEN PANEL BY PCR, STOOL

## 2018-11-07 ENCOUNTER — Ambulatory Visit: Payer: Commercial Managed Care - PPO | Admitting: Family Medicine

## 2018-11-07 ENCOUNTER — Other Ambulatory Visit: Payer: Self-pay

## 2018-11-07 ENCOUNTER — Encounter: Payer: Self-pay | Admitting: Family Medicine

## 2018-11-07 VITALS — BP 138/84 | HR 72 | Temp 98.5°F | Resp 16 | Wt 232.0 lb

## 2018-11-07 DIAGNOSIS — R1084 Generalized abdominal pain: Secondary | ICD-10-CM | POA: Diagnosis not present

## 2018-11-07 DIAGNOSIS — E781 Pure hyperglyceridemia: Secondary | ICD-10-CM | POA: Diagnosis not present

## 2018-11-07 DIAGNOSIS — E119 Type 2 diabetes mellitus without complications: Secondary | ICD-10-CM | POA: Diagnosis not present

## 2018-11-07 DIAGNOSIS — K2970 Gastritis, unspecified, without bleeding: Secondary | ICD-10-CM

## 2018-11-07 DIAGNOSIS — R197 Diarrhea, unspecified: Secondary | ICD-10-CM

## 2018-11-07 LAB — POCT GLYCOSYLATED HEMOGLOBIN (HGB A1C): Hemoglobin A1C: 6.9 % — AB (ref 4.0–5.6)

## 2018-11-07 NOTE — Progress Notes (Signed)
Patient: Robert Franklin Male    DOB: 03/05/1965   53 y.o.   MRN: [id number] Visit Date: 11/07/2018  Today's Provider: [name], MD   Chief Complaint  Patient presents with  . Diabetes  . Bowel issues   Subjective:   HPI    Diabetes Mellitus Type II, Follow-up:   Lab Results  Component Value Date   HGBA1C 6.9 (A) 11/07/2018   HGBA1C 7.0 (A) 07/05/2018   HGBA1C 6.5 (A) 12/19/2017    Last seen for diabetes 4 months ago.  Management since then includes no changes. He reports good compliance with treatment. He is not having side effects.  Current symptoms include none and have been stable. Home blood sugar records: fasting range: 180s  Episodes of hypoglycemia? no   Current insulin regiment: Is not on insulin Most Recent Eye Exam: up to date.   Weight trend: stable Prior visit with dietician: No Current exercise: no regular exercise Current diet habits: well balanced  He is here for diabetes follow-up complains of ongoing issues with abdominal pain and diarrhea.  No nausea or vomiting with these issues. Pertinent Labs:    Component Value Date/Time   CHOL 190 05/22/2018 1237   TRIG 458 (H) 05/22/2018 1237   HDL 31 (L) 05/22/2018 1237   LDLCALC Comment 05/22/2018 1237   CREATININE 0.91 10/17/2018 1720   CREATININE 1.01 01/29/2013 0111    Wt Readings from Last 3 Encounters:  11/07/18 232 lb (105.2 kg)  10/13/18 246 lb (111.6 kg)  08/07/18 238 lb (108 kg)    Patient also mentions that he has had dark stools that seem to have gotten worse. He denies any medication or diet changes. He reports that he has at least 8-10 watery dark stools daily.   Allergies  Allergen Reactions  . Mucinex [Guaifenesin Er] Other (See Comments)    Body tingles all over     Current Outpatient Medications:  .  butalbital-acetaminophen-caffeine (FIORICET) 50-325-40 MG tablet, TAKE 1 TABLET BY MOUTH 2 (TWO) TIMES DAILY AS NEEDED FOR HEADACHE., Disp: 30  tablet, Rfl: 4 .  CIALIS 20 MG tablet, TAKE 0.5 TABLETS (10 MG TOTAL) BY MOUTH DAILY AS NEEDED FOR ERECTILE DYSFUNCTION., Disp: 10 tablet, Rfl: 2 .  dicyclomine (BENTYL) 20 MG tablet, Take 1 tablet (20 mg total) by mouth every 6 (six) hours as needed for up to 7 days for spasms., Disp: 28 tablet, Rfl: 0 .  fenofibrate 160 MG tablet, Take 1 tablet (160 mg total) by mouth daily., Disp: 90 tablet, Rfl: 3 .  hydrochlorothiazide (HYDRODIURIL) 25 MG tablet, TAKE 1 TABLET BY MOUTH EVERY DAY WITH LOSARTAN, Disp: 90 tablet, Rfl: 3 .  Icosapent Ethyl (VASCEPA) 1 g CAPS, Take 2 capsules (2 g total) by mouth 2 (two) times daily., Disp: 120 capsule, Rfl: 11 .  losartan (COZAAR) 100 MG tablet, TAKE 1 TABLET BY MOUTH EVERY DAY. TAKE WITH HCTZ, Disp: 90 tablet, Rfl: 3 .  metFORMIN (GLUCOPHAGE) 1000 MG tablet, TAKE 1 TABLET (1,000 MG TOTAL) BY MOUTH 2 (TWO) TIMES DAILY WITH A MEAL., Disp: 180 tablet, Rfl: 1 .  metoprolol succinate (TOPROL-XL) 100 MG 24 hr tablet, TAKE 1 TABLET BY MOUTH EVERY DAY, Disp: 90 tablet, Rfl: 3 .  metroNIDAZOLE (FLAGYL) 500 MG tablet, Take 1 tablet (500 mg total) by mouth 2 (two) times daily., Disp: 14 tablet, Rfl: 0 .  omeprazole (PRILOSEC OTC) 20 MG tablet, Take 1 tablet (20 mg total) by mouth daily.,  Disp: 30 tablet, Rfl: 12 .  ondansetron (ZOFRAN-ODT) 4 MG disintegrating tablet, Take 1 tablet (4 mg total) by mouth every 8 (eight) hours as needed for nausea or vomiting., Disp: 30 tablet, Rfl: 0 .  promethazine-dextromethorphan (PROMETHAZINE-DM) 6.25-15 MG/5ML syrup, Take 5 mLs by mouth 4 (four) times daily as needed for cough., Disp: 240 mL, Rfl: 0 .  rosuvastatin (CRESTOR) 20 MG tablet, Take 1 tablet (20 mg total) by mouth daily., Disp: 90 tablet, Rfl: 3 .  VASCEPA 1 g CAPS, , Disp: , Rfl:   Review of Systems  Constitutional: Negative.   HENT: Negative.   Respiratory: Negative.   Cardiovascular: Negative.   Gastrointestinal: Positive for abdominal pain and diarrhea.  Endocrine:  Negative.   Genitourinary: Negative.   Allergic/Immunologic: Negative.   Neurological: Negative.   Hematological: Negative.   Psychiatric/Behavioral: Negative.     Social History   Tobacco Use  . Smoking status: Former Smoker    Packs/day: 1.00    Years: 28.00    Pack years: 28.00    Types: Cigarettes    Quit date: 12/24/1996    Years since quitting: 21.8  . Smokeless tobacco: Never Used  Substance Use Topics  . Alcohol use: Never    Frequency: Never    Comment: seldom      Objective:   BP 138/84   Pulse 72   Temp 98.5 F (36.9 C)   Resp 16   Wt 232 lb (105.2 kg)   SpO2 100%   BMI 34.26 kg/m  Vitals:   11/07/18 1557  BP: 138/84  Pulse: 72  Resp: 16  Temp: 98.5 F (36.9 C)  SpO2: 100%  Weight: 232 lb (105.2 kg)  Body mass index is 34.26 kg/m.   Physical Exam Vitals signs reviewed.  Constitutional:      Appearance: He is well-developed.  HENT:     Head: Normocephalic and atraumatic.     Right Ear: External ear normal.     Left Ear: External ear normal.     Nose: Nose normal.  Eyes:     Conjunctiva/sclera: Conjunctivae normal.  Neck:     Thyroid: No thyromegaly.  Cardiovascular:     Rate and Rhythm: Normal rate and regular rhythm.     Heart sounds: Normal heart sounds.  Pulmonary:     Effort: Pulmonary effort is normal.     Breath sounds: Normal breath sounds.  Abdominal:     General: There is no distension.     Palpations: Abdomen is soft.     Tenderness: There is no abdominal tenderness.  Musculoskeletal:        General: No tenderness.  Skin:    General: Skin is warm and dry.  Neurological:     General: No focal deficit present.     Mental Status: He is alert and oriented to person, place, and time.  Psychiatric:        Mood and Affect: Mood normal.        Behavior: Behavior normal.        Thought Content: Thought content normal.        Judgment: Judgment normal.      Results for orders placed or performed in visit on 11/07/18   POCT glycosylated hemoglobin (Hb A1C)  Result Value Ref Range   Hemoglobin A1C 6.9 (A) 4.0 - 5.6 %   HbA1c POC (<> result, manual entry)     HbA1c, POC (prediabetic range)     HbA1c, POC (controlled diabetic range)  Assessment & Plan    1. Type 2 diabetes mellitus without complication, without long-term current use of insulin (HCC) Good control with A1c of 6.9 today.  No changes.  Continue to work on habits. - POCT glycosylated hemoglobin (Hb A1C)  2. Gastritis, presence of bleeding unspecified, unspecified chronicity, unspecified gastritis type Add omeprazole 20 mg daily - CBC with Differential/Platelet - Ambulatory referral to Gastroenterology  3. Generalized abdominal pain Uncertain etiology.  Benign exam today. - Comprehensive metabolic panel - Lipase - Ambulatory referral to Gastroenterology  4. Hypertriglyceridemia without hypercholesterolemia Stressed continuing to work on lifestyle. - Lipid panel  5. Diarrhea of presumed infectious origin Add probiotic daily.  Refer back to GI for aforementioned GI symptoms. - Ambulatory referral to Gastroenterology     [name], MD  [facility]

## 2019-01-02 ENCOUNTER — Ambulatory Visit: Payer: Commercial Managed Care - PPO | Admitting: Gastroenterology

## 2019-01-02 ENCOUNTER — Other Ambulatory Visit: Payer: Self-pay

## 2019-01-02 ENCOUNTER — Encounter

## 2019-01-02 ENCOUNTER — Encounter: Payer: Self-pay | Admitting: Gastroenterology

## 2019-01-02 VITALS — BP 161/101 | HR 73 | Temp 98.2°F | Ht 69.0 in | Wt 238.6 lb

## 2019-01-02 DIAGNOSIS — K529 Noninfective gastroenteritis and colitis, unspecified: Secondary | ICD-10-CM

## 2019-01-02 DIAGNOSIS — K921 Melena: Secondary | ICD-10-CM

## 2019-01-02 DIAGNOSIS — R1030 Lower abdominal pain, unspecified: Secondary | ICD-10-CM

## 2019-01-02 DIAGNOSIS — R140 Abdominal distension (gaseous): Secondary | ICD-10-CM

## 2019-01-02 MED ORDER — HYOSCYAMINE SULFATE 0.125 MG PO TABS: 0.1250 mg | ORAL_TABLET | Freq: Four times a day (QID) | ORAL | 2 refills | Status: DC

## 2019-01-02 NOTE — Progress Notes (Signed)
Jonathon Bellows MD, MRCP(U.K) [address]  [place]  [place], [place] [address]  Main: [phone]  Fax: [phone]   Gastroenterology Consultation  Referring Provider:     [name].,* Primary Care Physician:  [name]., MD Primary Gastroenterologist:  Dr. Jonathon Bellows  Reason for Consultation:    Referred for gastritis and diarrhea.        HPI:   Robert Franklin is a 53 y.o. y/o male referred for consultation & management  by Dr. Rosanna Randy, Retia Passe., MD.   He presented to the emergency room on 10/13/2018 with nausea vomiting diarrhea and abdominal pain.  He has a history of diverticulitis and has undergone appendectomy and cholecystectomy in the past.  He underwent a CT scan of the abdomen which showed mild long segment wall thickening involving the rectum and sigmoid colon suggesting a nonspecific colitis.  Few "scattered colonic diverticula without inflammation to suggest acute diverticulitis. At that time he had a fever.  Given ciprofloxacin and Flagyl and discharged home.  He returned back to the ER 4 days later on 10/17/2018.  Had up to 12 episodes of loose stools. Test for C. difficile was negative.  Test for GI PCR was also negative.  Hemoglobin 15.2 g..  I have seen him over 2 years back for a screening colonoscopy.  Noted an ileocolonic anastomosis.  2 benign polyps resected.  He has had extensive abdominal surgery when he had his appendix to be from a ruptured appendix.  He states that since 10/14/2018 he has been having on and off lower abdominal pain cramping in nature occurring almost every day lasting for a few seconds.  Nonradiating.  Worse while eating.  Not really relieved by defecation.  Takes Advil once a week.  It is associated with gas and bloating.  He does feel that on days when he has more bloating the pain is more intense.  He has noticed black tarry stools on occasions.  Takes Prilosec but at night just before bedtime.  His  bowel movements are back to his baseline.  Denies any blood in the stool.  He has a bowel movement after he eats.  Watery in nature.  This is his baseline.  Consumes about 6 sachetsof sweet and low per week. Past Medical History:  Diagnosis Date   Coronary artery disease, non-occlusive 02/28/2008   Dr. Humphrey Rolls @ [facility]: 40-50% LCx lesion by cath,  EF 60% with normal wall motion.   Diabetes mellitus without complication ([place])    Essential hypertension    GERD (gastroesophageal reflux disease)    Headache    High triglycerides     was previously on treatment, but  he notes that this was stopped   Kidney stones    Moderate obesity 05/29/2014   OSA treated with BiPAP    Short bowel syndrome 1994    Past Surgical History:  Procedure Laterality Date   "stomach wrap"  1991   GERD   APPENDECTOMY     CARDIAC CATHETERIZATION  02/2008   Dr. Humphrey Rolls @ [facility]: 40-50% LCx lesion by cath,  EF 60% with normal wall motion.   Cardiolite Nuclear Stress Test   January 2010    heart rate increased from 80-158 BPM. No EKG changes.  there is a moderate sized  reversible defect in the inferior and inferior lateral wall as well as anteroseptal. EF 59%.   CardioNet Holter Monitor   January 2010    sinus rhythm. Rare  PVCs in setting was. Current PACs , seen in singles and pairs. Max heart rate 127, minimum heart rate 57. Average 84.   CHOLECYSTECTOMY     COLON SURGERY     due to gangrenous appendix   COLONOSCOPY WITH PROPOFOL N/A 04/26/2016   Procedure: COLONOSCOPY WITH PROPOFOL;  Surgeon: Jonathon Bellows, MD;  Location: [facility] ENDOSCOPY;  Service: Endoscopy;  Laterality: N/A;   HERNIA REPAIR  2010   INNER EAR SURGERY     TONSILLECTOMY     TYMPANOSTOMY TUBE PLACEMENT Bilateral 1973    Prior to Admission medications   Medication Sig Start Date End Date Taking? Authorizing Provider  butalbital-acetaminophen-caffeine (FIORICET) 50-325-40 MG tablet TAKE 1 TABLET BY MOUTH 2 (TWO) TIMES DAILY AS NEEDED FOR  HEADACHE. 09/06/18   [name]., MD  CIALIS 20 MG tablet TAKE 0.5 TABLETS (10 MG TOTAL) BY MOUTH DAILY AS NEEDED FOR ERECTILE DYSFUNCTION. 02/18/18   [name]., MD  dicyclomine (BENTYL) 20 MG tablet Take 1 tablet (20 mg total) by mouth every 6 (six) hours as needed for up to 7 days for spasms. 10/17/18 10/24/18  Lilia Pro., MD  fenofibrate 160 MG tablet Take 1 tablet (160 mg total) by mouth daily. 02/14/18   Leonie Man, MD  hydrochlorothiazide (HYDRODIURIL) 25 MG tablet TAKE 1 TABLET BY MOUTH EVERY DAY WITH LOSARTAN 07/31/17   [name]., MD  Icosapent Ethyl (VASCEPA) 1 g CAPS Take 2 capsules (2 g total) by mouth 2 (two) times daily. 02/01/18   Leonie Man, MD  losartan (COZAAR) 100 MG tablet TAKE 1 TABLET BY MOUTH EVERY DAY. TAKE WITH HCTZ 07/14/18   [name]., MD  metFORMIN (GLUCOPHAGE) 1000 MG tablet TAKE 1 TABLET (1,000 MG TOTAL) BY MOUTH 2 (TWO) TIMES DAILY WITH A MEAL. 09/06/18   [name]., MD  metoprolol succinate (TOPROL-XL) 100 MG 24 hr tablet TAKE 1 TABLET BY MOUTH EVERY DAY 07/10/18   [name]., MD  metroNIDAZOLE (FLAGYL) 500 MG tablet Take 1 tablet (500 mg total) by mouth 2 (two) times daily. 10/13/18   Cuthriell, Charline Bills, PA-C  omeprazole (PRILOSEC OTC) 20 MG tablet Take 1 tablet (20 mg total) by mouth daily. 05/14/15   [name]., MD  ondansetron (ZOFRAN-ODT) 4 MG disintegrating tablet Take 1 tablet (4 mg total) by mouth every 8 (eight) hours as needed for nausea or vomiting. 10/13/18   Cuthriell, Charline Bills, PA-C  promethazine-dextromethorphan (PROMETHAZINE-DM) 6.25-15 MG/5ML syrup Take 5 mLs by mouth 4 (four) times daily as needed for cough. 02/26/18   Carmon Ginsberg, PA  rosuvastatin (CRESTOR) 20 MG tablet Take 1 tablet (20 mg total) by mouth daily. 06/05/18 09/03/18  Leonie Man, MD  VASCEPA 1 g CAPS  06/16/18   [provider]    Family History  Problem Relation Age of Onset    Hypertension Mother    Irritable bowel syndrome Mother    Migraines Mother    Lumbar disc disease Mother    Heart attack Mother    Heart attack Maternal Uncle    Heart attack Maternal Grandfather      Social History   Tobacco Use   Smoking status: Former Smoker    Packs/day: 1.00    Years: 28.00    Pack years: 28.00    Types: Cigarettes    Quit date: 12/24/1996    Years since quitting: 22.0   Smokeless tobacco: Never Used  Substance  Use Topics   Alcohol use: Never    Frequency: Never    Comment: seldom   Drug use: No    Allergies as of 01/02/2019 - Review Complete 10/17/2018  Allergen Reaction Noted   Mucinex [guaifenesin er] Other (See Comments) 05/29/2014    Review of Systems:    All systems reviewed and negative except where noted in HPI.   Physical Exam:  There were no vitals taken for this visit. No LMP for male patient. Psych:  Alert and cooperative. Normal mood and affect. General:   Alert,  Well-developed, well-nourished, pleasant and cooperative in NAD Head:  Normocephalic and atraumatic. Eyes:  Sclera clear, no icterus.   Conjunctiva pink. Ears:  Normal auditory acuity. Nose:  No deformity, discharge, or lesions. Mouth:  No deformity or lesions,oropharynx pink & moist. Neck:  Supple; no masses or thyromegaly. Lungs:  Respirations even and unlabored.  Clear throughout to auscultation.   No wheezes, crackles, or rhonchi. No acute distress. Heart:  Regular rate and rhythm; no murmurs, clicks, rubs, or gallops. Abdomen:  Normal bowel sounds.  No bruits.  Soft, non-tender and non-distended without masses, hepatosplenomegaly or hernias noted.  No guarding or rebound tenderness.    Neurologic:  Alert and oriented x3;  grossly normal neurologically. Skin:  Intact without significant lesions or rashes. No jaundice. Lymph Nodes:  No significant cervical adenopathy. Psych:  Alert and cooperative. Normal mood and affect.  Imaging Studies: No results  found.  Assessment and Plan:   Robert Franklin is a 53 y.o. y/o male has been referred for abdominal pain.  Recent ER admission where he was found to have colitis on CT scan of the abdomen.  That has resolved after course of antibiotics.  Likely infectious.  Present issues are ongoing for over 3 months.  Gas bloating, black tarry stools, colicky crampy lower abdominal pain worse with gas and bloating.  The symptoms could be related to NSAID induced ulcers+/-gas bloating related to consumption of artificial sugars or bacterial overgrowth syndrome.  Plan 1.  Stop Advil 2.  Change PPI from night to mornings. 3.  Levsin 4 times daily as needed for discomfort. 4.  Stop artificial sugars. 5.  H. pylori breath test 6.  EGD to rule out peptic ulcers and sigmoidoscopy on the same day to evaluate colon for colitis. 7.  If about options do not help may consider treatment for IBS-D. 8.  Check CBC I have discussed alternative options, risks & benefits,  which include, but are not limited to, bleeding, infection, perforation,respiratory complication & drug reaction.  The patient agrees with this plan & written consent will be obtained.     Follow up in 4-6 weeks   Dr Jonathon Bellows MD,MRCP(U.K)

## 2019-01-03 ENCOUNTER — Encounter: Payer: Self-pay | Admitting: Gastroenterology

## 2019-01-03 ENCOUNTER — Telehealth: Payer: Self-pay

## 2019-01-03 ENCOUNTER — Ambulatory Visit (INDEPENDENT_AMBULATORY_CARE_PROVIDER_SITE_OTHER): Payer: Commercial Managed Care - PPO

## 2019-01-03 ENCOUNTER — Encounter: Payer: Self-pay | Admitting: Podiatry

## 2019-01-03 ENCOUNTER — Ambulatory Visit: Payer: Commercial Managed Care - PPO | Admitting: Podiatry

## 2019-01-03 VITALS — BP 146/96 | HR 77

## 2019-01-03 DIAGNOSIS — M7752 Other enthesopathy of left foot: Secondary | ICD-10-CM | POA: Diagnosis not present

## 2019-01-03 DIAGNOSIS — M778 Other enthesopathies, not elsewhere classified: Secondary | ICD-10-CM | POA: Diagnosis not present

## 2019-01-03 DIAGNOSIS — M7671 Peroneal tendinitis, right leg: Secondary | ICD-10-CM | POA: Diagnosis not present

## 2019-01-03 DIAGNOSIS — M7751 Other enthesopathy of right foot: Secondary | ICD-10-CM | POA: Diagnosis not present

## 2019-01-03 DIAGNOSIS — M7670 Peroneal tendinitis, unspecified leg: Secondary | ICD-10-CM

## 2019-01-03 LAB — CBC
Hematocrit: 45.2 % (ref 37.5–51.0)
Hemoglobin: 15.5 g/dL (ref 13.0–17.7)
MCH: 30 pg (ref 26.6–33.0)
MCHC: 34.3 g/dL (ref 31.5–35.7)
MCV: 88 fL (ref 79–97)
Platelets: 169 10*3/uL (ref 150–450)
RBC: 5.16 x10E6/uL (ref 4.14–5.80)
RDW: 12.9 % (ref 11.6–15.4)
WBC: 7.2 10*3/uL (ref 3.4–10.8)

## 2019-01-03 LAB — CBC WITH DIFFERENTIAL/PLATELET
Basophils Absolute: 0.1 10*3/uL (ref 0.0–0.2)
Basos: 1 %
EOS (ABSOLUTE): 0.1 10*3/uL (ref 0.0–0.4)
Eos: 2 %
Hematocrit: 44.2 % (ref 37.5–51.0)
Hemoglobin: 15.7 g/dL (ref 13.0–17.7)
Immature Grans (Abs): 0.1 10*3/uL (ref 0.0–0.1)
Immature Granulocytes: 1 %
Lymphocytes Absolute: 2 10*3/uL (ref 0.7–3.1)
Lymphs: 27 %
MCH: 30.5 pg (ref 26.6–33.0)
MCHC: 35.5 g/dL (ref 31.5–35.7)
MCV: 86 fL (ref 79–97)
Monocytes Absolute: 0.5 10*3/uL (ref 0.1–0.9)
Monocytes: 6 %
Neutrophils Absolute: 4.7 10*3/uL (ref 1.4–7.0)
Neutrophils: 63 %
Platelets: 177 10*3/uL (ref 150–450)
RBC: 5.14 x10E6/uL (ref 4.14–5.80)
RDW: 12.9 % (ref 11.6–15.4)
WBC: 7.4 10*3/uL (ref 3.4–10.8)

## 2019-01-03 LAB — COMPREHENSIVE METABOLIC PANEL
ALT: 35 IU/L (ref 0–44)
AST: 35 IU/L (ref 0–40)
Albumin/Globulin Ratio: 2 (ref 1.2–2.2)
Albumin: 4.9 g/dL (ref 3.8–4.9)
Alkaline Phosphatase: 102 IU/L (ref 39–117)
BUN/Creatinine Ratio: 16 (ref 9–20)
BUN: 14 mg/dL (ref 6–24)
Bilirubin Total: 0.6 mg/dL (ref 0.0–1.2)
CO2: 20 mmol/L (ref 20–29)
Calcium: 9.4 mg/dL (ref 8.7–10.2)
Chloride: 101 mmol/L (ref 96–106)
Creatinine, Ser: 0.89 mg/dL (ref 0.76–1.27)
GFR calc Af Amer: 114 mL/min/{1.73_m2} (ref >59)
GFR calc non Af Amer: 98 mL/min/{1.73_m2} (ref >59)
Globulin, Total: 2.5 g/dL (ref 1.5–4.5)
Glucose: 214 mg/dL — ABNORMAL HIGH (ref 65–99)
Potassium: 4.4 mmol/L (ref 3.5–5.2)
Sodium: 140 mmol/L (ref 134–144)
Total Protein: 7.4 g/dL (ref 6.0–8.5)

## 2019-01-03 LAB — LIPID PANEL
Chol/HDL Ratio: 5 ratio (ref 0.0–5.0)
Cholesterol, Total: 155 mg/dL (ref 100–199)
HDL: 31 mg/dL — ABNORMAL LOW (ref >39)
LDL Chol Calc (NIH): 58 mg/dL (ref 0–99)
Triglycerides: 431 mg/dL — ABNORMAL HIGH (ref 0–149)
VLDL Cholesterol Cal: 66 mg/dL — ABNORMAL HIGH (ref 5–40)

## 2019-01-03 LAB — LIPASE: Lipase: 54 U/L (ref 13–78)

## 2019-01-03 NOTE — Telephone Encounter (Signed)
Spoke with pt and informed him of lab result.

## 2019-01-03 NOTE — Progress Notes (Signed)
Normal CBC

## 2019-01-03 NOTE — Telephone Encounter (Signed)
-----   Message from Jonathon Bellows, MD sent at 01/03/2019  8:18 AM EST ----- Normal CBC

## 2019-01-04 LAB — H. PYLORI BREATH TEST: H pylori Breath Test: NEGATIVE

## 2019-01-04 NOTE — Progress Notes (Signed)
Subjective:   Patient ID: Robert Franklin, male   DOB: 53 y.o.   MRN: [id number]   HPI Patient presents stating he has developed a lot of pain in the outside of both feet right over left and states that he has been trying to lose weight and get more healthy and its making it difficult for him.  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  Patient does not smoke likes to be active   Review of Systems  All other systems reviewed and are negative.       Objective:  Physical Exam Vitals and nursing note reviewed.  Constitutional:      Appearance: He is well-developed.  Pulmonary:     Effort: Pulmonary effort is normal.  Musculoskeletal:        General: Normal range of motion.  Skin:    General: Skin is warm.  Neurological:     Mental Status: He is alert.     Neurovascular status intact muscle strength found to be adequate range of motion within normal limits with patient noted to have exquisite discomfort peroneal tendon insertion base of fifth metatarsal right over left with inflammation fluid around the insertion.  Patient does appear to have had possible plantar fasciitis with compensation but it appears the peroneal tendon insertion is the major problem that he is experiencing with good digital perfusion well oriented x3     Assessment:  Acute peroneal inflammation insertion base of fifth metatarsal right over left     Plan:  H&P conditions reviewed and today I did sterile prep and injected the insertion bilateral 3 mg Kenalog 5 mg Xylocaine and for the right applied fascial brace to lift up the lateral side of the foot along with ice therapy.  Patient is placed on anti-inflammatory therapy currently will be seen back to recheck  X-rays indicate that there is no signs of fracture or arthritis associated with the pain patient is experiencing

## 2019-01-07 ENCOUNTER — Ambulatory Visit: Payer: Commercial Managed Care - PPO | Admitting: Adult Health

## 2019-01-07 ENCOUNTER — Telehealth: Payer: Self-pay | Admitting: Cardiology

## 2019-01-07 ENCOUNTER — Encounter: Payer: Self-pay | Admitting: Adult Health

## 2019-01-07 ENCOUNTER — Other Ambulatory Visit: Payer: Self-pay

## 2019-01-07 ENCOUNTER — Encounter: Payer: Self-pay | Admitting: Cardiology

## 2019-01-07 VITALS — BP 142/90 | HR 73 | Ht 69.0 in | Wt 236.0 lb

## 2019-01-07 DIAGNOSIS — I10 Essential (primary) hypertension: Secondary | ICD-10-CM

## 2019-01-07 DIAGNOSIS — I2510 Atherosclerotic heart disease of native coronary artery without angina pectoris: Secondary | ICD-10-CM | POA: Diagnosis not present

## 2019-01-07 MED ORDER — CHLORTHALIDONE 50 MG PO TABS: 50.0000 mg | ORAL_TABLET | Freq: Every day | ORAL | 3 refills | Status: DC

## 2019-01-07 MED ORDER — METOPROLOL SUCCINATE ER 100 MG PO TB24: 100.0000 mg | ORAL_TABLET | Freq: Every day | ORAL | 3 refills | Status: DC

## 2019-01-07 NOTE — Telephone Encounter (Signed)
Pt c/o BP issue: STAT if pt c/o blurred vision, one-sided weakness or slurred speech  1. What are your last 5 BP readings?  Highest reading this weekend 150/110 lowest reading this weekend 135/90   2. Are you having any other symptoms (ex. Dizziness, headache, blurred vision, passed out)? Chest Pain, Palpitations,  & Headache   3. What is your BP issue? Patient is calling stating he has been experiencing High BP over the weekend. He took Nitroglycerin to help with it on Saturday 01/05/19 at 10:00 PM. He states he has been experiencing Chest Pain, Palpitations, and a Headache started after taking the Nitroglycerin. He states he is only feeling palpitations right now but is not feeling any other symptoms at this time.

## 2019-01-07 NOTE — Telephone Encounter (Signed)
Spoke with pt and over the weekend B/P was elevated and Saturday had palpitations with chest pain that radiated to back Per pt NTG was taken with relief also c/o headache.Pt has noted B/P elevation off and on for a couple of months Per pt has lost 30 LB and no change in salt intake Per pt glucose has been running 180 -200  Appt made for today with Jory Sims NP at 3:15 pm .Will forward to Dr Ellyn Hack for review .Adonis Housekeeper

## 2019-01-07 NOTE — Patient Instructions (Signed)
Medication Instructions:  STOP- HCTz START- Chlorthalidone 50 mg by mouth daily  If you need a refill on your cardiac medications before your next appointment, please call your pharmacy.  Labwork: None Ordered   Testing/Procedures: Your physician has requested that you have an exercise stress myoview. For further information please visit [email or web address]. Please follow instruction sheet, as given.  Follow-Up: 1 Month with Dr Ellyn Hack or Jory Sims, DNP  At [facility], you and your health needs are our priority.  As part of our continuing mission to provide you with exceptional heart care, we have created designated Provider Care Teams.  These Care Teams include your primary Cardiologist (physician) and Advanced Practice Providers (APPs -  Physician Assistants and Nurse Practitioners) who all work together to provide you with the care you need, when you need it.  Thank you for choosing [facility] at [facility]!!     Happy Holidays!!

## 2019-01-07 NOTE — Progress Notes (Signed)
Cardiology Office Note   Date:  01/07/2019   ID:  Robert Franklin, DOB 01/19/1966, MRN [id number]  PCP:  [name]., MD  Cardiologist:  Dr.Harding  No chief complaint on file.    History of Present Illness: Robert Franklin is a 53 y.o. male who presents for ongoing assessment and management of nonocclusive coronary artery disease, difficult to control hypertension, with other history to include GERD with abnormal bowel movements (plan to have EGD and colonoscopy in 3 days), history of OSA noncompliant with CPAP.    He comes today as his blood pressure has been elevated recently.  He has been taking his medications as directed but states that they are very labile.  Some blood pressures are as low as 120 over 60s and his highest to 160s over 80s.  He states that that can occur on and off throughout the day.  He uses both a wrist cuff and a arm cuff.  He states that he has been having headaches more often.  He denies any chest pain or chest pressure that is significant, he denies any dyspnea, or generalized fatigue.  He has had recent labs drawn which revealed normal kidney function, elevated blood glucose, and elevated triglycerides on 01/02/2019.  He is to follow-up with his PCP concerning management.  He has been on what he described as a "medication for his blood sugar that caused him to urinate more often" and was taken off of it.  He is working with his PCP for diabetic control.  He has lost a good bit of weight because of his GI symptoms, having pain after he eats.   Past Medical History:  Diagnosis Date  . Coronary artery disease, non-occlusive 02/28/2008   Dr. Humphrey Rolls @ [facility]: 40-50% LCx lesion by cath,  EF 60% with normal wall motion.  . Diabetes mellitus without complication ([place])   . Essential hypertension   . GERD (gastroesophageal reflux disease)   . Headache   . High triglycerides     was previously on treatment, but  he notes that this was stopped  . Kidney  stones   . Moderate obesity 05/29/2014  . OSA treated with BiPAP   . Short bowel syndrome 1994    Past Surgical History:  Procedure Laterality Date  . "stomach wrap"  1991   GERD  . APPENDECTOMY    . CARDIAC CATHETERIZATION  02/2008   Dr. Humphrey Rolls @ [facility]: 40-50% LCx lesion by cath,  EF 60% with normal wall motion.  [name] Nuclear Stress Test   January 2010    heart rate increased from 80-158 BPM. No EKG changes.  there is a moderate sized  reversible defect in the inferior and inferior lateral wall as well as anteroseptal. EF 59%.  . CardioNet Holter Monitor   January 2010    sinus rhythm. Rare PVCs in setting was. Current PACs , seen in singles and pairs. Max heart rate 127, minimum heart rate 57. Average 84.  . CHOLECYSTECTOMY    . COLON SURGERY     due to gangrenous appendix  . COLONOSCOPY WITH PROPOFOL N/A 04/26/2016   Procedure: COLONOSCOPY WITH PROPOFOL;  Surgeon: Jonathon Bellows, MD;  Location: [facility] ENDOSCOPY;  Service: Endoscopy;  Laterality: N/A;  . HERNIA REPAIR  2010  . INNER EAR SURGERY    . TONSILLECTOMY    . TYMPANOSTOMY TUBE PLACEMENT Bilateral 1973     Current Outpatient Medications  Medication Sig Dispense Refill  . butalbital-acetaminophen-caffeine (FIORICET) 50-325-40 MG  tablet TAKE 1 TABLET BY MOUTH 2 (TWO) TIMES DAILY AS NEEDED FOR HEADACHE. 30 tablet 4  . CIALIS 20 MG tablet TAKE 0.5 TABLETS (10 MG TOTAL) BY MOUTH DAILY AS NEEDED FOR ERECTILE DYSFUNCTION. 10 tablet 2  . dicyclomine (BENTYL) 20 MG tablet Take 1 tablet (20 mg total) by mouth every 6 (six) hours as needed for up to 7 days for spasms. 28 tablet 0  . fenofibrate 160 MG tablet Take 1 tablet (160 mg total) by mouth daily. 90 tablet 3  . hydrochlorothiazide (HYDRODIURIL) 25 MG tablet TAKE 1 TABLET BY MOUTH EVERY DAY WITH LOSARTAN 90 tablet 3  . hyoscyamine (LEVSIN) 0.125 MG tablet Take 1 tablet (0.125 mg total) by mouth 4 (four) times daily. PRN for pain 120 tablet 2  . Icosapent Ethyl (VASCEPA) 1 g CAPS  Take 2 capsules (2 g total) by mouth 2 (two) times daily. 120 capsule 11  . losartan (COZAAR) 100 MG tablet TAKE 1 TABLET BY MOUTH EVERY DAY. TAKE WITH HCTZ 90 tablet 3  . metFORMIN (GLUCOPHAGE) 1000 MG tablet TAKE 1 TABLET (1,000 MG TOTAL) BY MOUTH 2 (TWO) TIMES DAILY WITH A MEAL. 180 tablet 1  . metoprolol succinate (TOPROL-XL) 100 MG 24 hr tablet TAKE 1 TABLET BY MOUTH EVERY DAY 90 tablet 3  . metroNIDAZOLE (FLAGYL) 500 MG tablet Take 1 tablet (500 mg total) by mouth 2 (two) times daily. 14 tablet 0  . omeprazole (PRILOSEC OTC) 20 MG tablet Take 1 tablet (20 mg total) by mouth daily. 30 tablet 12  . ondansetron (ZOFRAN-ODT) 4 MG disintegrating tablet Take 1 tablet (4 mg total) by mouth every 8 (eight) hours as needed for nausea or vomiting. 30 tablet 0  . promethazine-dextromethorphan (PROMETHAZINE-DM) 6.25-15 MG/5ML syrup Take 5 mLs by mouth 4 (four) times daily as needed for cough. 240 mL 0  . rosuvastatin (CRESTOR) 20 MG tablet Take 1 tablet (20 mg total) by mouth daily. 90 tablet 3  . VASCEPA 1 g CAPS      No current facility-administered medications for this visit.    Allergies:   Mucinex [guaifenesin er]    Social History:  The patient  reports that he quit smoking about 22 years ago. His smoking use included cigarettes. He has a 28.00 pack-year smoking history. He has never used smokeless tobacco. He reports that he does not drink alcohol or use drugs.   Family History:  The patient's family history includes Heart attack in his maternal grandfather, maternal uncle, and mother; Hypertension in his mother; Irritable bowel syndrome in his mother; Lumbar disc disease in his mother; Migraines in his mother.    ROS: All other systems are reviewed and negative. Unless otherwise mentioned in H&P    PHYSICAL EXAM: VS:  BP (!) 142/90   Pulse 73   Ht 5\' 9"  (1.753 m)   Wt 236 lb (107 kg)   SpO2 95%   BMI 34.85 kg/m  , BMI Body mass index is 34.85 kg/m. GEN: Well nourished, well  developed, in no acute distress HEENT: normal Neck: no JVD, carotid bruits, or masses Cardiac: RRR; no murmurs, rubs, or gallops,no edema  Respiratory:  Clear to auscultation bilaterally, normal work of breathing GI: soft, nontender, nondistended, + BS MS: no deformity or atrophy Skin: warm and dry, no rash Neuro:  Strength and sensation are intact Psych: euthymic mood, full affect   EKG: Normal sinus rhythm heart rate of 73 bpm.   Recent Labs: 08/07/2018: TSH 1.740 01/02/2019: ALT 35; BUN  14; Creatinine, Ser 0.89; Hemoglobin 15.7; Platelets 177; Potassium 4.4; Sodium 140    Lipid Panel    Component Value Date/Time   CHOL 155 01/02/2019 1113   TRIG 431 (H) 01/02/2019 1113   HDL 31 (L) 01/02/2019 1113   CHOLHDL 5.0 01/02/2019 1113   LDLCALC 58 01/02/2019 1113   LDLDIRECT 46 07/05/2018 1405      Wt Readings from Last 3 Encounters:  01/07/19 236 lb (107 kg)  01/02/19 238 lb 9.6 oz (108.2 kg)  11/07/18 232 lb (105.2 kg)      Other studies Reviewed: I have reviewed his scanned cardiac catheterization report which revealed 4245% circumflex disease.  There were no occlusive disease noted in the RCA or the LAD.  This was completed on 02/28/2008.    ASSESSMENT AND PLAN:  1.  CAD: Per cardiac cath 2010 he had 2 lesions of 40% in the mid circumflex, with no coronary artery disease elsewhere.  He is having some difficult to control blood pressure, and also diabetes is not well controlled.  With these risk factors along with hypercholesterolemia, I will have a repeat stress Myoview completed to evaluate for ischemia.  He is advised not to take metoprolol the day that he has his stress test.  2.  Hypertension: He is hypertensive today.  I have rechecked his blood pressure in the office and found to be 168/96 in both arms.  He has already taken losartan and metoprolol.  I will change HCTZ to chlorthalidone 50 mg daily.  May need to consider adding amlodipine if blood pressure remains  elevated, or spironolactone.  He is advised to take metoprolol at nighttime and losartan chlorthalidone during the day.  He is to take his blood pressure twice a day once in the morning and once in the evening at consistent times and record so it can be reviewed on follow-up appointment after stress test.  3.  Uncontrolled diabetes: Being followed by PCP.  Defer to their medical management.  4.  Hypercholesterolemia: Remains on Crestor 20 mg daily.  Will need to check fasting lipids and LFTs on follow-up visit.  5.  OSA: Has not been compliant with CPAP as he states that the mask does not fit after he is lost so much weight.  This may contributing to his difficult control hypertension.  I recommended that he restart wearing his mask and have it resized to in order to have consistency with its use which can help to normalize blood pressure.  5.  Abnormal stools: Abdominal pain associated with eating.  He is due to have a colonoscopy and EGD due to dark tarry stools.  There is no evidence of anemia on his lab work.  Current medicines are reviewed at length with the patient today.    Labs/ tests ordered today include: Stress Myoview  Phill Myron. West Pugh, ANP, AACC   01/07/2019 3:24 PM    [address] [place] [address] Office [phone] Fax [phone]  Notice: This dictation was prepared with Dragon dictation along with smaller phrase technology. Any transcriptional errors that result from this process are unintentional and may not be corrected upon review.

## 2019-01-08 ENCOUNTER — Other Ambulatory Visit
Admission: RE | Admit: 2019-01-08 | Discharge: 2019-01-08 | Disposition: A | Discharge status: Home / Self Care | Payer: Commercial Managed Care - PPO | Source: Ambulatory Visit | Attending: Gastroenterology | Admitting: Gastroenterology

## 2019-01-08 ENCOUNTER — Telehealth (HOSPITAL_COMMUNITY): Payer: Self-pay

## 2019-01-08 DIAGNOSIS — Z01812 Encounter for preprocedural laboratory examination: Secondary | ICD-10-CM | POA: Diagnosis not present

## 2019-01-08 DIAGNOSIS — Z20828 Contact with and (suspected) exposure to other viral communicable diseases: Secondary | ICD-10-CM | POA: Insufficient documentation

## 2019-01-08 LAB — SARS CORONAVIRUS 2 (TAT 6-24 HRS): SARS Coronavirus 2: NEGATIVE

## 2019-01-08 NOTE — Telephone Encounter (Signed)
Encounter complete. 

## 2019-01-10 ENCOUNTER — Ambulatory Visit (HOSPITAL_COMMUNITY)
Admission: RE | Admit: 2019-01-10 | Discharge: 2019-01-10 | Disposition: A | Discharge status: Home / Self Care | Payer: Commercial Managed Care - PPO | Source: Ambulatory Visit | Attending: Cardiology | Admitting: Cardiology

## 2019-01-10 ENCOUNTER — Other Ambulatory Visit: Payer: Self-pay

## 2019-01-10 DIAGNOSIS — I10 Essential (primary) hypertension: Secondary | ICD-10-CM

## 2019-01-10 DIAGNOSIS — I2510 Atherosclerotic heart disease of native coronary artery without angina pectoris: Secondary | ICD-10-CM | POA: Diagnosis present

## 2019-01-10 HISTORY — PX: NM MYOVIEW LTD: HXRAD82

## 2019-01-10 LAB — MYOCARDIAL PERFUSION IMAGING
Estimated workload: 10.1 METS
Exercise duration (min): 9 min
Exercise duration (sec): 40 s
LV dias vol: 121 mL (ref 62–150)
LV sys vol: 65 mL
MPHR: 168 {beats}/min
Peak HR: 164 {beats}/min
Percent HR: 97 %
Rest HR: 75 {beats}/min
SDS: 3
SRS: 2
SSS: 5
TID: 1.08

## 2019-01-10 MED ORDER — TECHNETIUM TC 99M TETROFOSMIN IV KIT
31.4000 | PACK | Freq: Once | INTRAVENOUS | Status: AC | PRN
  Administered 2019-01-10: 31.4 via INTRAVENOUS
  Filled 2019-01-10: qty 32

## 2019-01-10 MED ORDER — TECHNETIUM TC 99M TETROFOSMIN IV KIT
9.8000 | PACK | Freq: Once | INTRAVENOUS | Status: AC | PRN
  Administered 2019-01-10: 9.8 via INTRAVENOUS
  Filled 2019-01-10: qty 10

## 2019-01-11 ENCOUNTER — Ambulatory Visit
Admission: RE | Admit: 2019-01-11 | Discharge: 2019-01-11 | Disposition: A | Discharge status: Home / Self Care | Payer: Commercial Managed Care - PPO | Attending: Gastroenterology | Admitting: Gastroenterology

## 2019-01-11 ENCOUNTER — Other Ambulatory Visit: Payer: Self-pay

## 2019-01-11 ENCOUNTER — Encounter: Payer: Self-pay | Admitting: Gastroenterology

## 2019-01-11 ENCOUNTER — Encounter
Admission: RE | Disposition: A | Discharge status: Home / Self Care | Payer: Self-pay | Source: Home / Self Care | Attending: Gastroenterology

## 2019-01-11 ENCOUNTER — Ambulatory Visit: Payer: Commercial Managed Care - PPO | Admitting: Certified Registered Nurse Anesthetist

## 2019-01-11 DIAGNOSIS — Z87891 Personal history of nicotine dependence: Secondary | ICD-10-CM | POA: Insufficient documentation

## 2019-01-11 DIAGNOSIS — K2970 Gastritis, unspecified, without bleeding: Secondary | ICD-10-CM | POA: Insufficient documentation

## 2019-01-11 DIAGNOSIS — I2510 Atherosclerotic heart disease of native coronary artery without angina pectoris: Secondary | ICD-10-CM | POA: Diagnosis not present

## 2019-01-11 DIAGNOSIS — Z79899 Other long term (current) drug therapy: Secondary | ICD-10-CM | POA: Diagnosis not present

## 2019-01-11 DIAGNOSIS — G4733 Obstructive sleep apnea (adult) (pediatric): Secondary | ICD-10-CM | POA: Insufficient documentation

## 2019-01-11 DIAGNOSIS — R197 Diarrhea, unspecified: Secondary | ICD-10-CM | POA: Insufficient documentation

## 2019-01-11 DIAGNOSIS — R1030 Lower abdominal pain, unspecified: Secondary | ICD-10-CM

## 2019-01-11 DIAGNOSIS — E119 Type 2 diabetes mellitus without complications: Secondary | ICD-10-CM | POA: Diagnosis not present

## 2019-01-11 DIAGNOSIS — Z7984 Long term (current) use of oral hypoglycemic drugs: Secondary | ICD-10-CM | POA: Insufficient documentation

## 2019-01-11 DIAGNOSIS — R109 Unspecified abdominal pain: Secondary | ICD-10-CM | POA: Diagnosis not present

## 2019-01-11 DIAGNOSIS — K921 Melena: Secondary | ICD-10-CM

## 2019-01-11 DIAGNOSIS — R519 Headache, unspecified: Secondary | ICD-10-CM | POA: Insufficient documentation

## 2019-01-11 DIAGNOSIS — R933 Abnormal findings on diagnostic imaging of other parts of digestive tract: Secondary | ICD-10-CM | POA: Insufficient documentation

## 2019-01-11 DIAGNOSIS — K219 Gastro-esophageal reflux disease without esophagitis: Secondary | ICD-10-CM | POA: Diagnosis not present

## 2019-01-11 DIAGNOSIS — I10 Essential (primary) hypertension: Secondary | ICD-10-CM | POA: Diagnosis not present

## 2019-01-11 DIAGNOSIS — K529 Noninfective gastroenteritis and colitis, unspecified: Secondary | ICD-10-CM | POA: Diagnosis not present

## 2019-01-11 HISTORY — PX: ESOPHAGOGASTRODUODENOSCOPY (EGD) WITH PROPOFOL: SHX5813

## 2019-01-11 HISTORY — PX: FLEXIBLE SIGMOIDOSCOPY: SHX5431

## 2019-01-11 LAB — GLUCOSE, CAPILLARY: Glucose-Capillary: 215 mg/dL — ABNORMAL HIGH (ref 70–99)

## 2019-01-11 SURGERY — ESOPHAGOGASTRODUODENOSCOPY (EGD) WITH PROPOFOL
Anesthesia: General

## 2019-01-11 MED ORDER — SODIUM CHLORIDE 0.9 % IV SOLN
INTRAVENOUS | Status: DC
  Administered 2019-01-11: 1000 mL via INTRAVENOUS

## 2019-01-11 MED ORDER — FENTANYL CITRATE (PF) 100 MCG/2ML IJ SOLN
INTRAMUSCULAR | Status: AC
  Filled 2019-01-11: qty 2

## 2019-01-11 MED ORDER — FENTANYL CITRATE (PF) 100 MCG/2ML IJ SOLN
INTRAMUSCULAR | Status: DC | PRN
  Administered 2019-01-11: 25 ug via INTRAVENOUS
  Administered 2019-01-11: 50 ug via INTRAVENOUS
  Administered 2019-01-11: 25 ug via INTRAVENOUS

## 2019-01-11 MED ORDER — PROPOFOL 10 MG/ML IV BOLUS
INTRAVENOUS | Status: DC | PRN
  Administered 2019-01-11: 30 mg via INTRAVENOUS
  Administered 2019-01-11: 100 mg via INTRAVENOUS
  Administered 2019-01-11: 40 mg via INTRAVENOUS
  Administered 2019-01-11: 30 mg via INTRAVENOUS

## 2019-01-11 MED ORDER — MIDAZOLAM HCL 2 MG/2ML IJ SOLN
INTRAMUSCULAR | Status: DC | PRN
  Administered 2019-01-11: 2 mg via INTRAVENOUS

## 2019-01-11 MED ORDER — PROPOFOL 10 MG/ML IV BOLUS
INTRAVENOUS | Status: AC
  Filled 2019-01-11: qty 40

## 2019-01-11 MED ORDER — LIDOCAINE HCL (CARDIAC) PF 100 MG/5ML IV SOSY
PREFILLED_SYRINGE | INTRAVENOUS | Status: DC | PRN
  Administered 2019-01-11: 100 mg via INTRAVENOUS

## 2019-01-11 MED ORDER — MIDAZOLAM HCL 2 MG/2ML IJ SOLN
INTRAMUSCULAR | Status: AC
  Filled 2019-01-11: qty 2

## 2019-01-11 MED ORDER — ONDANSETRON HCL 4 MG/2ML IJ SOLN
INTRAMUSCULAR | Status: DC | PRN
  Administered 2019-01-11: 4 mg via INTRAVENOUS

## 2019-01-11 NOTE — Transfer of Care (Signed)
Immediate Anesthesia Transfer of Care Note  Patient: Robert Franklin  Procedure(s) Performed: ESOPHAGOGASTRODUODENOSCOPY (EGD) WITH PROPOFOL (N/A ) FLEXIBLE SIGMOIDOSCOPY (N/A )  Patient Location: PACU and Endoscopy Unit  Anesthesia Type:General  Level of Consciousness: awake, oriented, drowsy and patient cooperative  Airway & Oxygen Therapy: Patient Spontanous Breathing  Post-op Assessment: Report given to RN and Post -op Vital signs reviewed and stable  Post vital signs: Reviewed and stable  Last Vitals:  Vitals Value Taken Time  BP 121/86 01/11/19 1015  Temp 36.2 C 01/11/19 1015  Pulse 67 01/11/19 1018  Resp 14 01/11/19 1018  SpO2 96 % 01/11/19 1018  Vitals shown include unvalidated device data.  Last Pain:  Vitals:   01/11/19 1015  TempSrc: Temporal  PainSc: 0-No pain         Complications: No apparent anesthesia complications

## 2019-01-11 NOTE — Anesthesia Preprocedure Evaluation (Signed)
Anesthesia Evaluation  Patient identified by MRN, date of birth, ID band Patient awake    Reviewed: Allergy & Precautions, NPO status , Patient's Chart, lab work & pertinent test results, reviewed documented beta blocker date and time   History of Anesthesia Complications Negative for: history of anesthetic complications  Airway Mallampati: III  TM Distance: >3 FB     Dental  (+) Chipped, Dental Advidsory Given   Pulmonary neg shortness of breath, sleep apnea (BiPap) , neg COPD, neg recent URI, former smoker,           Cardiovascular Exercise Tolerance: Good hypertension, Pt. on medications and Pt. on home beta blockers + angina (stable, recent nitro use on Monday) + CAD  (-) Past MI, (-) Cardiac Stents and (-) CABG (-) dysrhythmias (-) Valvular Problems/Murmurs     Neuro/Psych  Headaches, neg Seizures PSYCHIATRIC DISORDERS Anxiety Depression    GI/Hepatic Neg liver ROS, PUD, GERD  ,  Endo/Other  diabetes, Type 2  Renal/GU Renal disease     Musculoskeletal   Abdominal   Peds  Hematology   Anesthesia Other Findings Past Medical History: 02/28/2008: Coronary artery disease, non-occlusive     Comment:  Dr. Humphrey Rolls @ [facility]: 40-50% LCx lesion by cath,  EF 60% with              normal wall motion. No date: Diabetes mellitus without complication (HCC) No date: Essential hypertension No date: GERD (gastroesophageal reflux disease) No date: Headache No date: High triglycerides     Comment:   was previously on treatment, but  he notes that this               was stopped No date: Kidney stones 05/29/2014: Moderate obesity No date: OSA treated with BiPAP 1994: Short bowel syndrome  Patient had stress test yesterday that showed no ST elevation or T-wave inversion.  EF was 45-50%.  Reproductive/Obstetrics                             Anesthesia Physical  Anesthesia Plan  ASA: III  Anesthesia Plan:  General   Post-op Pain Management:    Induction: Intravenous  PONV Risk Score and Plan: 2 and Propofol infusion and TIVA  Airway Management Planned: Natural Airway and Nasal Cannula  Additional Equipment:   Intra-op Plan:   Post-operative Plan:   Informed Consent: I have reviewed the patients History and Physical, chart, labs and discussed the procedure including the risks, benefits and alternatives for the proposed anesthesia with the patient or authorized representative who has indicated his/her understanding and acceptance.       Plan Discussed with: CRNA  Anesthesia Plan Comments:         Anesthesia Quick Evaluation

## 2019-01-11 NOTE — Op Note (Signed)
[facility] Gastroenterology Patient Name: Robert Franklin Procedure Date: 01/11/2019 9:39 AM MRN: [id number] Account #: [id number] Date of Birth: 1965/05/11 Admit Type: Outpatient Age: 53 Room: [facility] ENDO ROOM 3 Gender: Male Note Status: Finalized Procedure:             Flexible Sigmoidoscopy Indications:           Clinically significant diarrhea of unexplained origin Providers:             Jonathon Bellows MD, MD Referring MD:          Janine Ores. Rosanna Randy, MD (Referring MD) Medicines:             Monitored Anesthesia Care Complications:         No immediate complications. Procedure:             Pre-Anesthesia Assessment:                        - Prior to the procedure, a History and Physical was                         performed, and patient medications, allergies and                         sensitivities were reviewed. The patient's tolerance                         of previous anesthesia was reviewed.                        - The risks and benefits of the procedure and the                         sedation options and risks were discussed with the                         patient. All questions were answered and informed                         consent was obtained.                        - ASA Grade Assessment: II - A patient with mild                         systemic disease.                        After obtaining informed consent, the scope was passed                         under direct vision. The Colonoscope was introduced                         through the anus and advanced to the the left                         transverse colon. The flexible sigmoidoscopy was  accomplished with ease. The patient tolerated the                         procedure well. The quality of the bowel preparation                         was excellent. Findings:      The perianal and digital rectal examinations were normal.      The entire examined colon appeared  normal.      The colon (entire examined portion) appeared normal. Biopsies for       histology were taken with a cold forceps from the entire colon for       evaluation of microscopic colitis. Impression:            - The entire examined colon is normal.                        - The entire examined colon is normal. Biopsied. Recommendation:        - Discharge patient to home (with escort).                        - Resume previous diet.                        - Await pathology results.                        - Return to my office in 2 weeks. Procedure Code(s):     --- Professional ---                        [phone], Sigmoidoscopy, flexible; with biopsy, single or                         multiple Diagnosis Code(s):     --- Professional ---                        R19.7, Diarrhea, unspecified CPT copyright 2019 American Medical Association. All rights reserved. The codes documented in this report are preliminary and upon coder review may  be revised to meet current compliance requirements. Jonathon Bellows, MD Jonathon Bellows MD, MD 01/11/2019 10:10:06 AM This report has been signed electronically. Number of Addenda: 0 Note Initiated On: 01/11/2019 9:39 AM Total Procedure Duration: 0 hours 7 minutes 40 seconds  Estimated Blood Loss:  Estimated blood loss: none.      [facility]

## 2019-01-11 NOTE — Op Note (Signed)
[facility] Gastroenterology Patient Name: Robert Franklin Procedure Date: 01/11/2019 9:41 AM MRN: [id number] Account #: [id number] Date of Birth: [birth year] Admit Type: Outpatient Age: 53 Room: [facility] ENDO ROOM 3 Gender: Male Note Status: Finalized Procedure:             Upper GI endoscopy Indications:           Abdominal pain Providers:             Jonathon Bellows MD, MD Referring MD:          Miguel Aschoff Medicines:             Monitored Anesthesia Care Complications:         No immediate complications. Procedure:             Pre-Anesthesia Assessment:                        - Prior to the procedure, a History and Physical was                         performed, and patient medications, allergies and                         sensitivities were reviewed. The patient's tolerance                         of previous anesthesia was reviewed.                        - The risks and benefits of the procedure and the                         sedation options and risks were discussed with the                         patient. All questions were answered and informed                         consent was obtained.                        - ASA Grade Assessment: II - A patient with mild                         systemic disease.                        After obtaining informed consent, the endoscope was                         passed under direct vision. Throughout the procedure,                         the patient's blood pressure, pulse, and oxygen                         saturations were monitored continuously. The Endoscope                         was introduced through the mouth, and advanced to the  third part of duodenum. The upper GI endoscopy was                         accomplished with ease. The patient tolerated the                         procedure well. Findings:      The esophagus was normal.      Localized moderate inflammation characterized by  congestion (edema) and       erythema was found in the gastric antrum. Biopsies were taken with a       cold forceps for histology.      The examined duodenum was normal. Biopsies for histology were taken with       a cold forceps for evaluation of celiac disease. Impression:            - Normal esophagus.                        - Gastritis. Biopsied.                        - Normal examined duodenum. Biopsied. Recommendation:        - Await pathology results.                        - Perform a flexible sigmoidoscopy for further                         evaluation of the rectum and sigmoid colon today. Procedure Code(s):     --- Professional ---                        [phone], Esophagogastroduodenoscopy, flexible,                         transoral; with biopsy, single or multiple Diagnosis Code(s):     --- Professional ---                        K29.70, Gastritis, unspecified, without bleeding                        R10.9, Unspecified abdominal pain CPT copyright 2019 American Medical Association. All rights reserved. The codes documented in this report are preliminary and upon coder review may  be revised to meet current compliance requirements. Jonathon Bellows, MD Jonathon Bellows MD, MD 01/11/2019 9:59:46 AM This report has been signed electronically. Number of Addenda: 0 Note Initiated On: 01/11/2019 9:41 AM Estimated Blood Loss:  Estimated blood loss: none.      [facility]

## 2019-01-11 NOTE — H&P (Signed)
Jonathon Bellows, MD [address], [place], [place], [place], [address] [address], [place], [place], [place], [address] Phone: [phone]  Fax: [phone]  Primary Care Physician:  [name]., MD   Pre-Procedure History & Physical: HPI:  Robert Franklin is a 53 y.o. male is here for an endoscopy and sigmoidoscopy Past Medical History:  Diagnosis Date  . Coronary artery disease, non-occlusive 02/28/2008   Dr. Humphrey Rolls @ [facility]: 40-50% LCx lesion by cath,  EF 60% with normal wall motion.  . Diabetes mellitus without complication ([place])   . Essential hypertension   . GERD (gastroesophageal reflux disease)   . Headache   . High triglycerides     was previously on treatment, but  he notes that this was stopped  . Kidney stones   . Moderate obesity 05/29/2014  . OSA treated with BiPAP   . Short bowel syndrome 1994    Past Surgical History:  Procedure Laterality Date  . "stomach wrap"  1991   GERD  . APPENDECTOMY    . CARDIAC CATHETERIZATION  02/2008   Dr. Humphrey Rolls @ [facility]: 40-50% LCx lesion by cath,  EF 60% with normal wall motion.  [name] Nuclear Stress Test   January 2010    heart rate increased from 80-158 BPM. No EKG changes.  there is a moderate sized  reversible defect in the inferior and inferior lateral wall as well as anteroseptal. EF 59%.  . CardioNet Holter Monitor   January 2010    sinus rhythm. Rare PVCs in setting was. Current PACs , seen in singles and pairs. Max heart rate 127, minimum heart rate 57. Average 84.  . CHOLECYSTECTOMY    . COLON SURGERY     due to gangrenous appendix  . COLONOSCOPY WITH PROPOFOL N/A 04/26/2016   Procedure: COLONOSCOPY WITH PROPOFOL;  Surgeon: Jonathon Bellows, MD;  Location: [facility] ENDOSCOPY;  Service: Endoscopy;  Laterality: N/A;  . HERNIA REPAIR  2010  . INNER EAR SURGERY    . TONSILLECTOMY    . TYMPANOSTOMY TUBE PLACEMENT Bilateral 1973    Prior to Admission medications   Medication Sig Start Date End Date Taking?  Authorizing Provider  butalbital-acetaminophen-caffeine (FIORICET) 50-325-40 MG tablet TAKE 1 TABLET BY MOUTH 2 (TWO) TIMES DAILY AS NEEDED FOR HEADACHE. 09/06/18  Yes [name]., MD  chlorthalidone (HYGROTON) 50 MG tablet Take 1 tablet (50 mg total) by mouth daily. 01/07/19  Yes Lendon Colonel, NP  CIALIS 20 MG tablet TAKE 0.5 TABLETS (10 MG TOTAL) BY MOUTH DAILY AS NEEDED FOR ERECTILE DYSFUNCTION. 02/18/18  Yes [name]., MD  fenofibrate 160 MG tablet Take 1 tablet (160 mg total) by mouth daily. 02/14/18  Yes Leonie Man, MD  hyoscyamine (LEVSIN) 0.125 MG tablet Take 1 tablet (0.125 mg total) by mouth 4 (four) times daily. PRN for pain 01/02/19  Yes Jonathon Bellows, MD  Icosapent Ethyl (VASCEPA) 1 g CAPS Take 2 capsules (2 g total) by mouth 2 (two) times daily. 02/01/18  Yes Leonie Man, MD  losartan (COZAAR) 100 MG tablet TAKE 1 TABLET BY MOUTH EVERY DAY. TAKE WITH HCTZ 07/14/18  Yes [name]., MD  metFORMIN (GLUCOPHAGE) 1000 MG tablet TAKE 1 TABLET (1,000 MG TOTAL) BY MOUTH 2 (TWO) TIMES DAILY WITH A MEAL. 09/06/18  Yes [name]., MD  metoprolol succinate (TOPROL-XL) 100 MG 24 hr tablet Take 1 tablet (100 mg total) by mouth daily. Take with  or immediately following a meal. 01/07/19  Yes Lendon Colonel, NP  metroNIDAZOLE (FLAGYL) 500 MG tablet Take 1 tablet (500 mg total) by mouth 2 (two) times daily. 10/13/18  Yes Cuthriell, Charline Bills, PA-C  omeprazole (PRILOSEC OTC) 20 MG tablet Take 1 tablet (20 mg total) by mouth daily. 05/14/15  Yes [name]., MD  ondansetron (ZOFRAN-ODT) 4 MG disintegrating tablet Take 1 tablet (4 mg total) by mouth every 8 (eight) hours as needed for nausea or vomiting. 10/13/18  Yes Cuthriell, Charline Bills, PA-C  promethazine-dextromethorphan (PROMETHAZINE-DM) 6.25-15 MG/5ML syrup Take 5 mLs by mouth 4 (four) times daily as needed for cough. 02/26/18  Yes Carmon Ginsberg, PA  VASCEPA 1 g CAPS  06/16/18  Yes  [provider]  dicyclomine (BENTYL) 20 MG tablet Take 1 tablet (20 mg total) by mouth every 6 (six) hours as needed for up to 7 days for spasms. 10/17/18 01/07/19  Lilia Pro., MD  rosuvastatin (CRESTOR) 20 MG tablet Take 1 tablet (20 mg total) by mouth daily. 06/05/18 01/07/19  Leonie Man, MD    Allergies as of 01/02/2019 - Review Complete 01/02/2019  Allergen Reaction Noted  . Mucinex [guaifenesin er] Other (See Comments) 05/29/2014    Family History  Problem Relation Age of Onset  . Hypertension Mother   . Irritable bowel syndrome Mother   . Migraines Mother   . Lumbar disc disease Mother   . Heart attack Mother   . Heart attack Maternal Uncle   . Heart attack Maternal Grandfather     Social History   Socioeconomic History  . Marital status: Married    Spouse name: Collie Siad  . Number of children: 0  . Years of education: 107  . Highest education level: Not on file  Occupational History  . Occupation: [employer]  Tobacco Use  . Smoking status: Former Smoker    Packs/day: 1.00    Years: 28.00    Pack years: 28.00    Types: Cigarettes    Quit date: 12/24/1996    Years since quitting: 22.0  . Smokeless tobacco: Never Used  Substance and Sexual Activity  . Alcohol use: Never    Comment: seldom  . Drug use: No  . Sexual activity: Not Currently  Other Topics Concern  . 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   Social Determinants of Health   Financial Resource Strain:   . Difficulty of Paying Living Expenses: Not on file  Food Insecurity:   . Worried About Charity fundraiser in the Last Year: Not on file  . Ran Out of Food in the Last Year: Not on file  Transportation Needs:   . Lack of Transportation (Medical): Not on file  . Lack of Transportation (Non-Medical): Not on file  Physical Activity:   . Days of Exercise per Week:  Not on file  . Minutes of Exercise per Session: Not on file  Stress:   . Feeling of Stress : Not on file  Social Connections:   . Frequency of Communication with Friends and Family: Not on file  . Frequency of Social Gatherings with Friends and Family: Not on file  . Attends Religious Services: Not on file  . Active Member of Clubs or Organizations: Not on file  . Attends Archivist Meetings: Not  on file  . Marital Status: Not on file  Intimate Partner Violence:   . Fear of Current or Ex-Partner: Not on file  . Emotionally Abused: Not on file  . Physically Abused: Not on file  . Sexually Abused: Not on file    Review of Systems: See HPI, otherwise negative ROS  Physical Exam: BP (!) 151/96   Pulse (!) 59   Temp (!) 97.1 F (36.2 C) (Temporal)   Resp 16   Ht 5\' 9"  (1.753 m)   Wt 107 kg   SpO2 100%   BMI 34.85 kg/m  General:   Alert,  pleasant and cooperative in NAD Head:  Normocephalic and atraumatic. Neck:  Supple; no masses or thyromegaly. Lungs:  Clear throughout to auscultation, normal respiratory effort.    Heart:  +S1, +S2, Regular rate and rhythm, No edema. Abdomen:  Soft, nontender and nondistended. Normal bowel sounds, without guarding, and without rebound.   Neurologic:  Alert and  oriented x4;  grossly normal neurologically.  Impression/Plan: Robert Franklin is here for an endoscopy and sigmoidoscopy  to be performed for  evaluation of abdominal pain and abnormal CT    Risks, benefits, limitations, and alternatives regarding endoscopy have been reviewed with the patient.  Questions have been answered.  All parties agreeable.   Jonathon Bellows, MD  01/11/2019, 9:33 AM

## 2019-01-11 NOTE — Anesthesia Post-op Follow-up Note (Signed)
Anesthesia QCDR form completed.        

## 2019-01-11 NOTE — Anesthesia Postprocedure Evaluation (Signed)
Anesthesia Post Note  Patient: Ngai Konigsberg Kinser  Procedure(s) Performed: ESOPHAGOGASTRODUODENOSCOPY (EGD) WITH PROPOFOL (N/A ) FLEXIBLE SIGMOIDOSCOPY (N/A )  Patient location during evaluation: Endoscopy Anesthesia Type: General Level of consciousness: awake and alert Pain management: pain level controlled Vital Signs Assessment: post-procedure vital signs reviewed and stable Respiratory status: spontaneous breathing, nonlabored ventilation, respiratory function stable and patient connected to nasal cannula oxygen Cardiovascular status: blood pressure returned to baseline and stable Postop Assessment: no apparent nausea or vomiting Anesthetic complications: no     Last Vitals:  Vitals:   01/11/19 1015 01/11/19 1025  BP: 121/86 (!) 133/98  Pulse: 71 70  Resp: 14 13  Temp: (!) 36.2 C   SpO2: 96% 97%    Last Pain:  Vitals:   01/11/19 1025  TempSrc:   PainSc: 0-No pain                 Martha Clan

## 2019-01-14 ENCOUNTER — Encounter: Payer: Self-pay | Admitting: *Deleted

## 2019-01-14 LAB — SURGICAL PATHOLOGY

## 2019-01-16 ENCOUNTER — Ambulatory Visit: Payer: Commercial Managed Care - PPO | Admitting: Podiatry

## 2019-01-27 ENCOUNTER — Other Ambulatory Visit: Payer: Self-pay | Admitting: Gastroenterology

## 2019-01-28 ENCOUNTER — Ambulatory Visit: Payer: Commercial Managed Care - PPO | Admitting: Podiatry

## 2019-01-29 ENCOUNTER — Encounter: Payer: Self-pay | Admitting: Gastroenterology

## 2019-02-03 ENCOUNTER — Other Ambulatory Visit: Payer: Self-pay | Admitting: Family Medicine

## 2019-02-03 ENCOUNTER — Other Ambulatory Visit: Payer: Self-pay | Admitting: Cardiology

## 2019-02-03 DIAGNOSIS — N529 Male erectile dysfunction, unspecified: Secondary | ICD-10-CM

## 2019-02-03 NOTE — Progress Notes (Signed)
Hard to tell what the tests truly shows.  Need to tell what his symptoms are.  It looks like prior inferior infarct, but could easily be diaphragmatic attenuation.  I am supposed be seeing him later on this week.  He can discuss them.  Glenetta Hew, MD

## 2019-02-07 ENCOUNTER — Ambulatory Visit: Payer: Commercial Managed Care - PPO | Admitting: Cardiology

## 2019-02-07 ENCOUNTER — Ambulatory Visit: Payer: Self-pay | Admitting: Family Medicine

## 2019-02-07 ENCOUNTER — Other Ambulatory Visit: Payer: Self-pay

## 2019-02-07 ENCOUNTER — Encounter: Payer: Self-pay | Admitting: Cardiology

## 2019-02-07 VITALS — BP 128/76 | HR 74 | Temp 97.9°F | Ht 69.0 in | Wt 230.0 lb

## 2019-02-07 DIAGNOSIS — R9439 Abnormal result of other cardiovascular function study: Secondary | ICD-10-CM | POA: Diagnosis not present

## 2019-02-07 DIAGNOSIS — I2510 Atherosclerotic heart disease of native coronary artery without angina pectoris: Secondary | ICD-10-CM | POA: Diagnosis not present

## 2019-02-07 DIAGNOSIS — I208 Other forms of angina pectoris: Secondary | ICD-10-CM | POA: Insufficient documentation

## 2019-02-07 DIAGNOSIS — E669 Obesity, unspecified: Secondary | ICD-10-CM

## 2019-02-07 DIAGNOSIS — E786 Lipoprotein deficiency: Secondary | ICD-10-CM | POA: Diagnosis not present

## 2019-02-07 DIAGNOSIS — E781 Pure hyperglyceridemia: Secondary | ICD-10-CM

## 2019-02-07 DIAGNOSIS — I2089 Other forms of angina pectoris: Secondary | ICD-10-CM | POA: Insufficient documentation

## 2019-02-07 DIAGNOSIS — I10 Essential (primary) hypertension: Secondary | ICD-10-CM | POA: Diagnosis not present

## 2019-02-07 MED ORDER — CHLORTHALIDONE 50 MG PO TABS: 25.0000 mg | ORAL_TABLET | Freq: Every day | ORAL | 3 refills | Status: DC

## 2019-02-07 NOTE — Assessment & Plan Note (Signed)
Blood pressure looks good today.  With him having some dizziness issues and feeling little bit down, not eating well.  I will have him cut his Thalitone dose back to 25 mg daily and otherwise continue current dose of losartan and Toprol

## 2019-02-07 NOTE — Assessment & Plan Note (Signed)
Low HDL and high triglycerides along with obesity and hypertension is consistent with metabolic syndrome.  He is at high risk for coronary disease.  Triglycerides are better but not fully improved with Vascepa and fenofibrate.  Probably needs more aggressive glycemic control which would help some.

## 2019-02-07 NOTE — Assessment & Plan Note (Signed)
Still losing weight.  Not intentional.  Probably needs to be looked at in more detail with his PCP once we complete his cardiac evaluation.

## 2019-02-07 NOTE — Assessment & Plan Note (Signed)
Very unusual that he has very atypical symptoms for angina, but the fact that he had a bad spell relieved with nitroglycerin and exercise intolerance /fatigue with dyspnea on exertion combined with an abnormal stress test is definitely concerning for this being potentially cardiac ischemia related.  His echocardiogram does show a fixed defect in the distribution that correlates with a known area of disease in the circumflex this dominant.  That and the fact that his EF is down is concerning that he potentially has potentially been fixed ischemia in the circumflex distribution.  With an intermediate risk stress test and concerning symptoms with known disease in the circumflex distribution, I think the only appropriate course of action is to proceed with cardiac catheterization and possible PCI.  PLAN: LEFT HEART CATHETERIZATION WITH CORONARY ANGIOGRAPHY AND POSSIBLE PERCUTANEOUS CORONARY INTERVENTION --See consent below. He is already on beta-blocker and ARB along with statin and we will have him start aspirin.

## 2019-02-07 NOTE — H&P (View-Only) (Signed)
Primary Care Provider: Jerrol Franklin., MD Cardiologist: Robert Hew, MD Electrophysiologist:   Clinic Note: Chief Complaint  Patient presents with  . Follow-up    Stress test results  . Chest Pain    Unusual chest pain symptoms    HPI:    Robert Franklin is a 54 y.o. morbidly obese male with a PMH Notable for OSA (on CPAP) along with nonobstructive CAD by cath who presents today for 1 month follow-up to discuss results of Myoview stress test..   Robert Franklin was last seen on January 07, 2019 by Robert Sims, NP. --> Noted BP was elevated into the 160s over 80s.  More frequent headaches.  No chest pain/pressure.  Noted PCP is started on diabetic medication that caused him to urinate a lot and this was then stopped (he is not sure the name).  He noted several weird symptoms including headache and just body aches dizziness.  Similar symptoms to when he had his heart catheterization years ago.    Myoview ordered  Recent Hospitalizations:   [facility] on 01/11/2019  Reviewed  CV studies:    The following studies were reviewed today: (if available, images/films reviewed: From Epic Chart or Care Everywhere) . Treadmill Myoview 01/10/2019 (personally reviewed): Walk 9:40 min-10.1 METS) EF ~45 to 50%.  INTERMEDIATE RISK STUDY-moderate sized mostly fixed perfusion defect in anterior apical segment.  No reversibility.  Suggests prior infarct, without ischemia ->  o This territory is consistent with significant disease in the dominant circumflex.   Interval History:   Robert Franklin is here today on follow-up the results of his stress test.  He tells me that back when he had his last cath done in 2010 he had a significant episode of chest discomfort associate with headaches and body aches with dizziness.  This resolved with nitroglycerin.  His heart cath did not show significant stenosis.  He had not really had the symptoms until maybe  the week or so before he saw Robert Sims, NP.  He said for about the last month now he has been noting having again daily headaches with blood pressures being up and down.  He felt dizzy lightheaded.  He also noticed some left-sided chest discomfort is actually more centralized there was somewhat stuttering.  It was not really associated with any exertion.  He took nitroglycerin and immediately his headache did not go away but everything else went away.  He also noted that his blood pressure went down and his overall anxiety level went down.  What is noted since then is that he is just has significant exertional fatigue with exertional dyspnea ever since that episode.  He just cannot do hardly anything without having to stop and catch his breath.  He said walking on the treadmill he said he was not really go to the next level because he just could not catch his breath.  He did not have any chest pain.  What he has now is off and on nagging episodes of chest discomfort that is not associated with any particular activity.  The other thing that is been bothering him over the last almost year that he has lost a significant matter weight that is really not intentional.  He has not necessarily noted that he is eating differently besides the fact that he just has not had as much of an appetite.  He still making himself eat.  He is also been having more frequent headaches.  CV  Review of Symptoms (Summary): positive for - chest pain, dyspnea on exertion, irregular heartbeat, orthopnea and Fatigue, dizziness negative for - edema, palpitations, paroxysmal nocturnal dyspnea, rapid heart rate, shortness of breath or Syncope/near syncope, TIA/amaurosis fugax, claudication  The patient does not have symptoms concerning for COVID-19 infection (fever, chills, cough, or new shortness of breath).  The patient is practicing social distancing & Masking.  His work does allow him to be appropriately distanced.   REVIEWED  OF SYSTEMS   A comprehensive ROS was performed. Review of Systems  Constitutional: Positive for malaise/fatigue and weight loss (Unintentional roughly 45 pounds.). Negative for chills and fever.  HENT: Negative for congestion and nosebleeds.   Respiratory: Negative for cough and wheezing.   Cardiovascular: Negative for leg swelling.  Gastrointestinal: Negative for blood in stool, melena, nausea and vomiting.       Less appetite than usual  Genitourinary: Negative for hematuria.  Musculoskeletal: Positive for joint pain (Mild arthritis pains).  Neurological: Positive for dizziness, weakness (Generalized) and headaches (Pretty much daily headaches of several different kinds, migraine, tension etc.). Negative for focal weakness.  Psychiatric/Behavioral: Negative for memory loss. The patient is not nervous/anxious and does not have insomnia.    I have reviewed and (if needed) personally updated the patient's problem list, medications, allergies, past medical and surgical history, social and family history.   PAST MEDICAL HISTORY   Past Medical History:  Diagnosis Date  . Coronary artery disease, non-occlusive 02/28/2008   Dr. Humphrey Rolls @ [facility]: 2 x 40-50% lesions in dominant LCx lesion by cath,  EF 60% with normal wall motion.  . Diabetes mellitus without complication ([place])   . Essential hypertension   . GERD (gastroesophageal reflux disease)   . Headache   . High triglycerides     was previously on treatment, but  he notes that this was stopped  . Kidney stones   . Moderate obesity 05/29/2014  . OSA treated with BiPAP   . Short bowel syndrome 1994    PAST SURGICAL HISTORY   Past Surgical History:  Procedure Laterality Date  . "stomach wrap"  1991   GERD  . APPENDECTOMY    . CARDIAC CATHETERIZATION  02/2008   Dr. Humphrey Rolls @ [facility]: 2 x 40-50% lesions in a dominant LCx by cath,  EF 60% with normal wall motion.  Robert Franklin Nuclear Stress Test   January 2010    heart rate increased from  80-158 BPM. No EKG changes.  there is a moderate sized  reversible defect in the inferior and inferior lateral wall as well as anteroseptal. EF 59%.  . CardioNet Holter Monitor   January 2010    sinus rhythm. Rare PVCs in setting was. Current PACs , seen in singles and pairs. Max heart rate 127, minimum heart rate 57. Average 84.  . CHOLECYSTECTOMY    . COLON SURGERY     due to gangrenous appendix  . COLONOSCOPY WITH PROPOFOL N/A 04/26/2016   Procedure: COLONOSCOPY WITH PROPOFOL;  Surgeon: Jonathon Bellows, MD;  Location: [facility] ENDOSCOPY;  Service: Endoscopy;  Laterality: N/A;  . ESOPHAGOGASTRODUODENOSCOPY (EGD) WITH PROPOFOL N/A 01/11/2019   Procedure: ESOPHAGOGASTRODUODENOSCOPY (EGD) WITH PROPOFOL;  Surgeon: Jonathon Bellows, MD;  Location: [facility] ENDOSCOPY;  Service: Gastroenterology;  Laterality: N/A;  . FLEXIBLE SIGMOIDOSCOPY N/A 01/11/2019   Procedure: FLEXIBLE SIGMOIDOSCOPY;  Surgeon: Jonathon Bellows, MD;  Location: [facility] ENDOSCOPY;  Service: Gastroenterology;  Laterality: N/A;  . HERNIA REPAIR  2010  . INNER EAR SURGERY    . NM MYOVIEW  LTD  01/10/2019   Treadmill Myoview: Walk 9:40 min-10.1 METS) EF ~45 to 50%.  INTERMEDIATE RISK STUDY-moderate sized mostly fixed perfusion defect in anterior apical segment.  No reversibility.  Suggests prior infarct without ischemia.  . TONSILLECTOMY    . TYMPANOSTOMY TUBE PLACEMENT Bilateral 1973     MEDICATIONS/ALLERGIES   Current Meds  Medication Sig  . butalbital-acetaminophen-caffeine (FIORICET) 50-325-40 MG tablet TAKE 1 TABLET BY MOUTH 2 (TWO) TIMES DAILY AS NEEDED FOR HEADACHE.  . fenofibrate 160 MG tablet Take 1 tablet (160 mg total) by mouth daily.  . hyoscyamine (LEVSIN) 0.125 MG tablet TAKE 1 TABLET BY MOUTH 4 TIMES A DAY AS NEEDED FOR PAIN  . losartan (COZAAR) 100 MG tablet TAKE 1 TABLET BY MOUTH EVERY DAY. TAKE WITH HCTZ  . metFORMIN (GLUCOPHAGE) 1000 MG tablet TAKE 1 TABLET (1,000 MG TOTAL) BY MOUTH 2 (TWO) TIMES DAILY WITH A MEAL.  . metoprolol  succinate (TOPROL-XL) 100 MG 24 hr tablet Take 1 tablet (100 mg total) by mouth daily. Take with or immediately following a meal.  . omeprazole (PRILOSEC OTC) 20 MG tablet Take 1 tablet (20 mg total) by mouth daily.  . ondansetron (ZOFRAN-ODT) 4 MG disintegrating tablet Take 1 tablet (4 mg total) by mouth every 8 (eight) hours as needed for nausea or vomiting.  . tadalafil (CIALIS) 20 MG tablet TAKE 0.5 TABLETS (10 MG TOTAL) BY MOUTH DAILY AS NEEDED FOR ERECTILE DYSFUNCTION.  . VASCEPA 1 g capsule TAKE 2 CAPSULES (2 G TOTAL) BY MOUTH 2 (TWO) TIMES DAILY.    Allergies  Allergen Reactions  . Mucinex [Guaifenesin Er] Other (See Comments)    Body tingles all over     SOCIAL HISTORY/FAMILY HISTORY   Social History   Tobacco Use  . Smoking status: Former Smoker    Packs/day: 1.00    Years: 28.00    Pack years: 28.00    Types: Cigarettes    Quit date: 12/24/1996    Years since quitting: 22.1  . Smokeless tobacco: Never Used  Substance Use Topics  . Alcohol use: Never    Comment: seldom  . 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    Family History family history includes Heart attack in his maternal grandfather, maternal uncle, and mother; Hypertension in his mother; Irritable bowel syndrome in his mother; Lumbar disc disease in his mother; Migraines in his mother.   OBJCTIVE -PE, EKG, labs   Wt Readings from Last 3 Encounters:  02/07/19 230 lb (104.3 kg)  01/11/19 236 lb (107 kg)  01/10/19 236 lb (107 kg)  January 2020-weight 246 pounds.  Physical Exam: BP 128/76   Pulse 74   Temp 97.9 F (36.6 C)   Ht 5\' 9"  (QA348G m)   Wt 230 lb (104.3 kg)   BMI 33.97 kg/m  Physical Exam  Constitutional: He is oriented to person, place, and time. He appears well-developed and well-nourished. No distress.  Healthy-appearing, does appear lighter  than my last saw him.  Well-groomed  HENT:  Head: Normocephalic and atraumatic.  Eyes: Pupils are equal, round, and reactive to light. Conjunctivae and EOM are normal.  Neck: No hepatojugular reflux and no JVD present. Carotid bruit is not present. No tracheal deviation present. No thyromegaly present.  Cardiovascular: Normal rate, regular rhythm and intact distal pulses.  Occasional extrasystoles are present. PMI is not displaced. Exam reveals no gallop and no friction rub.  No murmur heard. Pulmonary/Chest: Effort normal and breath sounds normal. No respiratory distress. He has no wheezes. He has no rales.  Abdominal: Soft. Bowel sounds are normal. He exhibits no distension. There is no abdominal tenderness. There is no rebound.  Musculoskeletal:        General: No edema. Normal range of motion.     Cervical back: Normal range of motion and neck supple.  Neurological: He is alert and oriented to person, place, and time. No cranial nerve deficit.  Skin: Skin is warm and dry.  Bilateral venous stasis and varicose veins.  No significant dermatitis.  Psychiatric: He has a normal mood and affect. His behavior is normal. Judgment and thought content normal.  Seems just a little bit exasperated  Vitals reviewed.   Adult ECG Report Not checked  Recent Labs:    Lab Results  Component Value Date   CHOL 155 01/02/2019   HDL 31 (L) 01/02/2019   LDLCALC 58 01/02/2019   LDLDIRECT 46 07/05/2018   TRIG 431 (H) 01/02/2019   CHOLHDL 5.0 01/02/2019   Lab Results  Component Value Date   CREATININE 0.89 01/02/2019   BUN 14 01/02/2019   NA 140 01/02/2019   K 4.4 01/02/2019   CL 101 01/02/2019   CO2 20 01/02/2019    ASSESSMENT/PLAN    Problem List Items Addressed This Visit    Arteriosclerosis of coronary artery (Chronic)    Previous cardiac cath showed mild to moderate disease in the dominant circumflex and now there is clear evidence of potentially fixed defect or rest ischemia in this  very distribution.  He has been on aggressive lipid management with statin fibrate and Vascepa.  His LDL is actually well within goal. He is on beta-blocker and ARB.  Now with abnormal nuclear stress test and albeit atypical symptoms, I think this course of action is to proceed with cardiac catheterization.  Plan: Schedule for cardiac catheterization.      Relevant Medications   chlorthalidone (HYGROTON) 50 MG tablet   Essential hypertension (Chronic)    Blood pressure looks good today.  With him having some dizziness issues and feeling little bit down, not eating well.  I will have him cut his Thalitone dose back to 25 mg daily and otherwise continue current dose of losartan and Toprol      Relevant Medications   chlorthalidone (HYGROTON) 50 MG tablet   Obesity, Class I, BMI 30.0-34.9 (see actual BMI) (Chronic)    Still losing weight.  Not intentional.  Probably needs to be looked at in more detail with his PCP once we complete his cardiac evaluation.      Hypertriglyceridemia without hypercholesterolemia (Chronic)    He is now on Vascepa along with statin and fenofibrate.  Triglycerides are still not at goal even though everything else is.  I suspect this may also have to do with hyperglycemia.  I know he had a lot of urination issues with what sounded like an SGLT2 inhibitor, I think that may be the best course of action him going forward.  Would also potentially help his triglycerides.      Relevant Medications   chlorthalidone (HYGROTON) 50 MG tablet   Abnormally low high density lipoprotein (HDL) cholesterol with hypertriglyceridemia (Chronic)    Low HDL and high triglycerides along with obesity and hypertension is consistent with metabolic syndrome.  He is at high risk for coronary  disease.  Triglycerides are better but not fully improved with Vascepa and fenofibrate.  Probably needs more aggressive glycemic control which would help some.      Relevant Medications    chlorthalidone (HYGROTON) 50 MG tablet   Other Relevant Orders   Basic metabolic panel   CBC   Abnormal nuclear stress test - Primary    Very unusual that he has very atypical symptoms for angina, but the fact that he had a bad spell relieved with nitroglycerin and exercise intolerance /fatigue with dyspnea on exertion combined with an abnormal stress test is definitely concerning for this being potentially cardiac ischemia related.  His echocardiogram does show a fixed defect in the distribution that correlates with a known area of disease in the circumflex this dominant.  That and the fact that his EF is down is concerning that he potentially has potentially been fixed ischemia in the circumflex distribution.  With an intermediate risk stress test and concerning symptoms with known disease in the circumflex distribution, I think the only appropriate course of action is to proceed with cardiac catheterization and possible PCI.  PLAN: LEFT HEART CATHETERIZATION WITH CORONARY ANGIOGRAPHY AND POSSIBLE PERCUTANEOUS CORONARY INTERVENTION --See consent below. He is already on beta-blocker and ARB along with statin and we will have him start aspirin.      Relevant Orders   Basic metabolic panel   CBC   Atypical angina (HCC)    Not sure what to make of these intermittent episodes of chest discomfort he is having, but clearly the exertional dyspnea symptoms and fatigue with a now positive stress test is concerning for coronary ischemia.  He is on good dose of beta-blocker, and had nitroglycerin responsive chest pain.    Plan: We will proceed with cardiac catheterization given intermediate risk tress test.       Relevant Medications   chlorthalidone (HYGROTON) 50 MG tablet   Other Relevant Orders   Basic metabolic panel   CBC       COVID-19 Education: The signs and symptoms of COVID-19 were discussed with the patient and how to seek care for testing (follow up with PCP or arrange E-visit).    The importance of social distancing was discussed today.  I spent a total of 28 minutes with the patient and chart review. >  50% of the time was spent in direct patient consultation.  Additional time spent with chart review (studies, outside notes, etc): 15 Total Time: 43 min   Current medicines are reviewed at length with the patient today.  (+/- concerns) none   Patient Instructions / Medication Changes & Studies & Tests Ordered   Patient Instructions  Medication Instructions:    DECREASE CHLORTHALIDONE TO 25 MG DAILY   *If you need a refill on your cardiac medications before your next appointment, please call your pharmacy*  Lab Work:   SEE BELOOW - CBC BMP   Labs: You will need to have blood drawn  TODAY . You do not need to be fasting.  ON Friday JAN 15 , 2021 - NEED A COVID [place] TEST IS COMPLETED  SELF ISOLATE UNTIL CARDIAC CATH ON Tuesday.  Testing/Procedures: WILL BE  SCHEDULE AT [place] [facility]  You are scheduled for a Cardiac Catheterization on Tuesday, January 19 with Dr. Glenetta Franklin.  Follow-Up: At [facility], you and your health needs are our priority.  As part of our continuing mission to provide you with exceptional  heart care, we have created designated Provider Care Teams.  These Care Teams include your primary Cardiologist (physician) and Advanced Practice Providers (APPs -  Physician Assistants and Nurse Practitioners) who all work together to provide you with the care you need, when you need it.  Your next appointment:   3 week(s)  The format for your next appointment:   In Person  Provider:   Glenetta Hew, MD  Studies Ordered:   Orders Placed This Encounter  Procedures  . Basic metabolic panel  . CBC     Robert Franklin, M.D., M.S. Interventional Cardiologist   Pager # [phone] Phone # [phone] [address] [place], [place] [address]   Thank you for choosing  Heartcare at [facility]!!

## 2019-02-07 NOTE — Patient Instructions (Signed)
Medication Instructions:    DECREASE CHLORTHALIDONE TO 25 MG DAILY   *If you need a refill on your cardiac medications before your next appointment, please call your pharmacy*  Lab Work:    SEE BELOOW - CBC BMP  Testing/Procedures: WILL BE  SCHEDULE AT [place] [facility] Your physician has requested that you have a cardiac catheterization. Cardiac catheterization is used to diagnose and/or treat various heart conditions. Doctors may recommend this procedure for a number of different reasons. The most common reason is to evaluate chest pain. Chest pain can be a symptom of coronary artery disease (CAD), and cardiac catheterization can show whether plaque is narrowing or blocking your heart's arteries. This procedure is also used to evaluate the valves, as well as measure the blood flow and oxygen levels in different parts of your heart. For further information please visit [email or web address][email or web address]. Please follow instruction sheet, as given.  Follow-Up: At [facility], you and your health needs are our priority.  As part of our continuing mission to provide you with exceptional heart care, we have created designated Provider Care Teams.  These Care Teams include your primary Cardiologist (physician) and Advanced Practice Providers (APPs -  Physician Assistants and Nurse Practitioners) who all work together to provide you with the care you need, when you need it.  Your next appointment:   3 week(s)  The format for your next appointment:   In Person  Provider:   Glenetta Hew, MD         [address] [place] [address] Dept: [phone] Loc: [phone]  Robert Franklin  02/07/2019  You are scheduled for a Cardiac Catheterization on Tuesday, January 19 with Dr. Glenetta Hew.  1. Please arrive at the [facility] (Main Entrance A) at [facility]: [address] [place], [place] [address] at 5:30 AM (This time is two hours before your procedure to ensure your preparation). Free valet parking service is available.   Special note: Every effort is made to have your procedure done on time. Please understand that emergencies sometimes delay scheduled procedures.  2. Diet: Do not eat solid foods after midnight.  The patient may have clear liquids until 5am upon the day of the procedure.  3. Labs: You will need to have blood drawn  TODAY . You do not need to be fasting.  ON Friday JAN 15 , 2021 - NEED A COVID [place] TEST IS COMPLETED  SELF ISOLATE UNTIL CARDIAC CATH ON Tuesday.  4. Medication instructions in preparation for your procedure:  DO NOT TAKE YOUR LOSARTAN THE MORNING OF THE PROCEDURE  Do not take Diabetes Med Glucophage (Metformin) on the day of the procedure and HOLD 48 HOURS AFTER THE PROCEDURE.  On the morning of your procedure, take your Aspirin 81 MG  and any morning medicines NOT listed above.  You may use sips of water.  5. Plan for one night stay--bring personal belongings. 6. Bring a current list of your medications and current insurance cards. 7. You MUST have a responsible person to drive you home. 8. Someone MUST be with you the first 24 hours after you arrive home or your discharge will be delayed. 9. Please wear clothes that are easy to get on and off and wear slip-on shoes.  Thank you for allowing [place] to care for you!   -- [place] [facility]

## 2019-02-07 NOTE — Assessment & Plan Note (Signed)
Previous cardiac cath showed mild to moderate disease in the dominant circumflex and now there is clear evidence of potentially fixed defect or rest ischemia in this very distribution.  He has been on aggressive lipid management with statin fibrate and Vascepa.  His LDL is actually well within goal. He is on beta-blocker and ARB.  Now with abnormal nuclear stress test and albeit atypical symptoms, I think this course of action is to proceed with cardiac catheterization.  Plan: Schedule for cardiac catheterization.

## 2019-02-07 NOTE — Assessment & Plan Note (Signed)
Not sure what to make of these intermittent episodes of chest discomfort he is having, but clearly the exertional dyspnea symptoms and fatigue with a now positive stress test is concerning for coronary ischemia.  He is on good dose of beta-blocker, and had nitroglycerin responsive chest pain.    Plan: We will proceed with cardiac catheterization given intermediate risk tress test.

## 2019-02-07 NOTE — Assessment & Plan Note (Signed)
He is now on Vascepa along with statin and fenofibrate.  Triglycerides are still not at goal even though everything else is.  I suspect this may also have to do with hyperglycemia.  I know he had a lot of urination issues with what sounded like an SGLT2 inhibitor, I think that may be the best course of action him going forward.  Would also potentially help his triglycerides.

## 2019-02-07 NOTE — Progress Notes (Signed)
Primary Care Provider: [name]., MD Cardiologist: Glenetta Hew, MD Electrophysiologist:   Clinic Note: Chief Complaint  Patient presents with  . Follow-up    Stress test results  . Chest Pain    Unusual chest pain symptoms    HPI:    Robert Franklin is a 54 y.o. morbidly obese male with a PMH Notable for OSA (on CPAP) along with nonobstructive CAD by cath who presents today for 1 month follow-up to discuss results of Myoview stress test..   Robert Franklin was last seen on January 07, 2019 by Jory Sims, NP. --> Noted BP was elevated into the 160s over 80s.  More frequent headaches.  No chest pain/pressure.  Noted PCP is started on diabetic medication that caused him to urinate a lot and this was then stopped (he is not sure the name).  He noted several weird symptoms including headache and just body aches dizziness.  Similar symptoms to when he had his heart catheterization years ago.    Myoview ordered  Recent Hospitalizations:   [facility] S[facility] on 01/11/2019  Reviewed  CV studies:    The following studies were reviewed today: (if available, images/films reviewed: From Epic Chart or Care Everywhere) . Treadmill Myoview 01/10/2019 (personally reviewed): Walk 9:40 min-10.1 METS) EF ~45 to 50%.  INTERMEDIATE RISK STUDY-moderate sized mostly fixed perfusion defect in anterior apical segment.  No reversibility.  Suggests prior infarct, without ischemia ->  o This territory is consistent with significant disease in the dominant circumflex.   Interval History:   Robert Franklin is here today on follow-up the results of his stress test.  He tells me that back when he had his last cath done in 2010 he had a significant episode of chest discomfort associate with headaches and body aches with dizziness.  This resolved with nitroglycerin.  His heart cath did not show significant stenosis.  He had not really had the symptoms until maybe  the week or so before he saw Jory Sims, NP.  He said for about the last month now he has been noting having again daily headaches with blood pressures being up and down.  He felt dizzy lightheaded.  He also noticed some left-sided chest discomfort is actually more centralized there was somewhat stuttering.  It was not really associated with any exertion.  He took nitroglycerin and immediately his headache did not go away but everything else went away.  He also noted that his blood pressure went down and his overall anxiety level went down.  What is noted since then is that he is just has significant exertional fatigue with exertional dyspnea ever since that episode.  He just cannot do hardly anything without having to stop and catch his breath.  He said walking on the treadmill he said he was not really go to the next level because he just could not catch his breath.  He did not have any chest pain.  What he has now is off and on nagging episodes of chest discomfort that is not associated with any particular activity.  The other thing that is been bothering him over the last almost year that he has lost a significant matter weight that is really not intentional.  He has not necessarily noted that he is eating differently besides the fact that he just has not had as much of an appetite.  He still making himself eat.  He is also been having more frequent headaches.  CV  Review of Symptoms (Summary): positive for - chest pain, dyspnea on exertion, irregular heartbeat, orthopnea and Fatigue, dizziness negative for - edema, palpitations, paroxysmal nocturnal dyspnea, rapid heart rate, shortness of breath or Syncope/near syncope, TIA/amaurosis fugax, claudication  The patient does not have symptoms concerning for COVID-19 infection (fever, chills, cough, or new shortness of breath).  The patient is practicing social distancing & Masking.  His work does allow him to be appropriately distanced.   REVIEWED  OF SYSTEMS   A comprehensive ROS was performed. Review of Systems  Constitutional: Positive for malaise/fatigue and weight loss (Unintentional roughly 45 pounds.). Negative for chills and fever.  HENT: Negative for congestion and nosebleeds.   Respiratory: Negative for cough and wheezing.   Cardiovascular: Negative for leg swelling.  Gastrointestinal: Negative for blood in stool, melena, nausea and vomiting.       Less appetite than usual  Genitourinary: Negative for hematuria.  Musculoskeletal: Positive for joint pain (Mild arthritis pains).  Neurological: Positive for dizziness, weakness (Generalized) and headaches (Pretty much daily headaches of several different kinds, migraine, tension etc.). Negative for focal weakness.  Psychiatric/Behavioral: Negative for memory loss. The patient is not nervous/anxious and does not have insomnia.    I have reviewed and (if needed) personally updated the patient's problem list, medications, allergies, past medical and surgical history, social and family history.   PAST MEDICAL HISTORY   Past Medical History:  Diagnosis Date  . Coronary artery disease, non-occlusive 02/28/2008   Dr. Humphrey Rolls @ [facility]: 2 x 40-50% lesions in dominant LCx lesion by cath,  EF 60% with normal wall motion.  . Diabetes mellitus without complication ([place])   . Essential hypertension   . GERD (gastroesophageal reflux disease)   . Headache   . High triglycerides     was previously on treatment, but  he notes that this was stopped  . Kidney stones   . Moderate obesity 05/29/2014  . OSA treated with BiPAP   . Short bowel syndrome 1994    PAST SURGICAL HISTORY   Past Surgical History:  Procedure Laterality Date  . "stomach wrap"  1991   GERD  . APPENDECTOMY    . CARDIAC CATHETERIZATION  02/2008   Dr. Humphrey Rolls @ [facility]: 2 x 40-50% lesions in a dominant LCx by cath,  EF 60% with normal wall motion.  [name] Nuclear Stress Test   January 2010    heart rate increased from  80-158 BPM. No EKG changes.  there is a moderate sized  reversible defect in the inferior and inferior lateral wall as well as anteroseptal. EF 59%.  . CardioNet Holter Monitor   January 2010    sinus rhythm. Rare PVCs in setting was. Current PACs , seen in singles and pairs. Max heart rate 127, minimum heart rate 57. Average 84.  . CHOLECYSTECTOMY    . COLON SURGERY     due to gangrenous appendix  . COLONOSCOPY WITH PROPOFOL N/A 04/26/2016   Procedure: COLONOSCOPY WITH PROPOFOL;  Surgeon: Jonathon Bellows, MD;  Location: [facility] ENDOSCOPY;  Service: Endoscopy;  Laterality: N/A;  . ESOPHAGOGASTRODUODENOSCOPY (EGD) WITH PROPOFOL N/A 01/11/2019   Procedure: ESOPHAGOGASTRODUODENOSCOPY (EGD) WITH PROPOFOL;  Surgeon: Jonathon Bellows, MD;  Location: [facility] ENDOSCOPY;  Service: Gastroenterology;  Laterality: N/A;  . FLEXIBLE SIGMOIDOSCOPY N/A 01/11/2019   Procedure: FLEXIBLE SIGMOIDOSCOPY;  Surgeon: Jonathon Bellows, MD;  Location: [facility] ENDOSCOPY;  Service: Gastroenterology;  Laterality: N/A;  . HERNIA REPAIR  2010  . INNER EAR SURGERY    . NM MYOVIEW  LTD  01/10/2019   Treadmill Myoview: Walk 9:40 min-10.1 METS) EF ~45 to 50%.  INTERMEDIATE RISK STUDY-moderate sized mostly fixed perfusion defect in anterior apical segment.  No reversibility.  Suggests prior infarct without ischemia.  . TONSILLECTOMY    . TYMPANOSTOMY TUBE PLACEMENT Bilateral 1973     MEDICATIONS/ALLERGIES   Current Meds  Medication Sig  . butalbital-acetaminophen-caffeine (FIORICET) 50-325-40 MG tablet TAKE 1 TABLET BY MOUTH 2 (TWO) TIMES DAILY AS NEEDED FOR HEADACHE.  . fenofibrate 160 MG tablet Take 1 tablet (160 mg total) by mouth daily.  . hyoscyamine (LEVSIN) 0.125 MG tablet TAKE 1 TABLET BY MOUTH 4 TIMES A DAY AS NEEDED FOR PAIN  . losartan (COZAAR) 100 MG tablet TAKE 1 TABLET BY MOUTH EVERY DAY. TAKE WITH HCTZ  . metFORMIN (GLUCOPHAGE) 1000 MG tablet TAKE 1 TABLET (1,000 MG TOTAL) BY MOUTH 2 (TWO) TIMES DAILY WITH A MEAL.  . metoprolol  succinate (TOPROL-XL) 100 MG 24 hr tablet Take 1 tablet (100 mg total) by mouth daily. Take with or immediately following a meal.  . omeprazole (PRILOSEC OTC) 20 MG tablet Take 1 tablet (20 mg total) by mouth daily.  . ondansetron (ZOFRAN-ODT) 4 MG disintegrating tablet Take 1 tablet (4 mg total) by mouth every 8 (eight) hours as needed for nausea or vomiting.  . tadalafil (CIALIS) 20 MG tablet TAKE 0.5 TABLETS (10 MG TOTAL) BY MOUTH DAILY AS NEEDED FOR ERECTILE DYSFUNCTION.  . VASCEPA 1 g capsule TAKE 2 CAPSULES (2 G TOTAL) BY MOUTH 2 (TWO) TIMES DAILY.    Allergies  Allergen Reactions  . Mucinex [Guaifenesin Er] Other (See Comments)    Body tingles all over     SOCIAL HISTORY/FAMILY HISTORY   Social History   Tobacco Use  . Smoking status: Former Smoker    Packs/day: 1.00    Years: 28.00    Pack years: 28.00    Types: Cigarettes    Quit date: 12/24/1996    Years since quitting: 22.1  . Smokeless tobacco: Never Used  Substance Use Topics  . Alcohol use: Never    Comment: seldom  . 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    Family History family history includes Heart attack in his maternal grandfather, maternal uncle, and mother; Hypertension in his mother; Irritable bowel syndrome in his mother; Lumbar disc disease in his mother; Migraines in his mother.   OBJCTIVE -PE, EKG, labs   Wt Readings from Last 3 Encounters:  02/07/19 230 lb (104.3 kg)  01/11/19 236 lb (107 kg)  01/10/19 236 lb (107 kg)  January 2020-weight 246 pounds.  Physical Exam: BP 128/76   Pulse 74   Temp 97.9 F (36.6 C)   Ht 5\' 9"  (1.753 m)   Wt 230 lb (104.3 kg)   BMI 33.97 kg/m  Physical Exam  Constitutional: He is oriented to person, place, and time. He appears well-developed and well-nourished. No distress.  Healthy-appearing, does appear lighter  than my last saw him.  Well-groomed  HENT:  Head: Normocephalic and atraumatic.  Eyes: Pupils are equal, round, and reactive to light. Conjunctivae and EOM are normal.  Neck: No hepatojugular reflux and no JVD present. Carotid bruit is not present. No tracheal deviation present. No thyromegaly present.  Cardiovascular: Normal rate, regular rhythm and intact distal pulses.  Occasional extrasystoles are present. PMI is not displaced. Exam reveals no gallop and no friction rub.  No murmur heard. Pulmonary/Chest: Effort normal and breath sounds normal. No respiratory distress. He has no wheezes. He has no rales.  Abdominal: Soft. Bowel sounds are normal. He exhibits no distension. There is no abdominal tenderness. There is no rebound.  Musculoskeletal:        General: No edema. Normal range of motion.     Cervical back: Normal range of motion and neck supple.  Neurological: He is alert and oriented to person, place, and time. No cranial nerve deficit.  Skin: Skin is warm and dry.  Bilateral venous stasis and varicose veins.  No significant dermatitis.  Psychiatric: He has a normal mood and affect. His behavior is normal. Judgment and thought content normal.  Seems just a little bit exasperated  Vitals reviewed.   Adult ECG Report Not checked  Recent Labs:    Lab Results  Component Value Date   CHOL 155 01/02/2019   HDL 31 (L) 01/02/2019   LDLCALC 58 01/02/2019   LDLDIRECT 46 07/05/2018   TRIG 431 (H) 01/02/2019   CHOLHDL 5.0 01/02/2019   Lab Results  Component Value Date   CREATININE 0.89 01/02/2019   BUN 14 01/02/2019   NA 140 01/02/2019   K 4.4 01/02/2019   CL 101 01/02/2019   CO2 20 01/02/2019    ASSESSMENT/PLAN    Problem List Items Addressed This Visit    Arteriosclerosis of coronary artery (Chronic)    Previous cardiac cath showed mild to moderate disease in the dominant circumflex and now there is clear evidence of potentially fixed defect or rest ischemia in this  very distribution.  He has been on aggressive lipid management with statin fibrate and Vascepa.  His LDL is actually well within goal. He is on beta-blocker and ARB.  Now with abnormal nuclear stress test and albeit atypical symptoms, I think this course of action is to proceed with cardiac catheterization.  Plan: Schedule for cardiac catheterization.      Relevant Medications   chlorthalidone (HYGROTON) 50 MG tablet   Essential hypertension (Chronic)    Blood pressure looks good today.  With him having some dizziness issues and feeling little bit down, not eating well.  I will have him cut his Thalitone dose back to 25 mg daily and otherwise continue current dose of losartan and Toprol      Relevant Medications   chlorthalidone (HYGROTON) 50 MG tablet   Obesity, Class I, BMI 30.0-34.9 (see actual BMI) (Chronic)    Still losing weight.  Not intentional.  Probably needs to be looked at in more detail with his PCP once we complete his cardiac evaluation.      Hypertriglyceridemia without hypercholesterolemia (Chronic)    He is now on Vascepa along with statin and fenofibrate.  Triglycerides are still not at goal even though everything else is.  I suspect this may also have to do with hyperglycemia.  I know he had a lot of urination issues with what sounded like an SGLT2 inhibitor, I think that may be the best course of action him going forward.  Would also potentially help his triglycerides.      Relevant Medications   chlorthalidone (HYGROTON) 50 MG tablet   Abnormally low high density lipoprotein (HDL) cholesterol with hypertriglyceridemia (Chronic)    Low HDL and high triglycerides along with obesity and hypertension is consistent with metabolic syndrome.  He is at high risk for coronary  disease.  Triglycerides are better but not fully improved with Vascepa and fenofibrate.  Probably needs more aggressive glycemic control which would help some.      Relevant Medications    chlorthalidone (HYGROTON) 50 MG tablet   Other Relevant Orders   Basic metabolic panel   CBC   Abnormal nuclear stress test - Primary    Very unusual that he has very atypical symptoms for angina, but the fact that he had a bad spell relieved with nitroglycerin and exercise intolerance /fatigue with dyspnea on exertion combined with an abnormal stress test is definitely concerning for this being potentially cardiac ischemia related.  His echocardiogram does show a fixed defect in the distribution that correlates with a known area of disease in the circumflex this dominant.  That and the fact that his EF is down is concerning that he potentially has potentially been fixed ischemia in the circumflex distribution.  With an intermediate risk stress test and concerning symptoms with known disease in the circumflex distribution, I think the only appropriate course of action is to proceed with cardiac catheterization and possible PCI.  PLAN: LEFT HEART CATHETERIZATION WITH CORONARY ANGIOGRAPHY AND POSSIBLE PERCUTANEOUS CORONARY INTERVENTION --See consent below. He is already on beta-blocker and ARB along with statin and we will have him start aspirin.      Relevant Orders   Basic metabolic panel   CBC   Atypical angina (HCC)    Not sure what to make of these intermittent episodes of chest discomfort he is having, but clearly the exertional dyspnea symptoms and fatigue with a now positive stress test is concerning for coronary ischemia.  He is on good dose of beta-blocker, and had nitroglycerin responsive chest pain.    Plan: We will proceed with cardiac catheterization given intermediate risk tress test.       Relevant Medications   chlorthalidone (HYGROTON) 50 MG tablet   Other Relevant Orders   Basic metabolic panel   CBC       COVID-19 Education: The signs and symptoms of COVID-19 were discussed with the patient and how to seek care for testing (follow up with PCP or arrange E-visit).    The importance of social distancing was discussed today.  I spent a total of 28 minutes with the patient and chart review. >  50% of the time was spent in direct patient consultation.  Additional time spent with chart review (studies, outside notes, etc): 15 Total Time: 43 min   Current medicines are reviewed at length with the patient today.  (+/- concerns) none   Patient Instructions / Medication Changes & Studies & Tests Ordered   Patient Instructions  Medication Instructions:    DECREASE CHLORTHALIDONE TO 25 MG DAILY   *If you need a refill on your cardiac medications before your next appointment, please call your pharmacy*  Lab Work:   SEE [place] - CBC BMP   Labs: You will need to have blood drawn  TODAY . You do not need to be fasting.  ON Friday JAN 15 , 2021 - NEED A COVID [place] TEST IS COMPLETED  SELF ISOLATE UNTIL CARDIAC CATH ON Tuesday.  Testing/Procedures: WILL BE  SCHEDULE AT [facility]  You are scheduled for a Cardiac Catheterization on Tuesday, January 19 with Dr. Glenetta Hew.  Follow-Up: At [facility], you and your health needs are our priority.  As part of our continuing mission to provide you with exceptional  heart care, we have created designated Provider Care Teams.  These Care Teams include your primary Cardiologist (physician) and Advanced Practice Providers (APPs -  Physician Assistants and Nurse Practitioners) who all work together to provide you with the care you need, when you need it.  Your next appointment:   3 week(s)  The format for your next appointment:   In Person  Provider:   Glenetta Hew, MD  Studies Ordered:   Orders Placed This Encounter  Procedures  . Basic metabolic panel  . CBC     Glenetta Hew, M.D., M.S. Interventional Cardiologist   Pager # [phone] Phone # [phone] [address]. [place], [place] [address]   Thank you for choosing  Heartcare at [facility]!!

## 2019-02-08 ENCOUNTER — Other Ambulatory Visit: Payer: Self-pay | Admitting: *Deleted

## 2019-02-08 ENCOUNTER — Other Ambulatory Visit
Admission: RE | Admit: 2019-02-08 | Discharge: 2019-02-08 | Disposition: A | Discharge status: Home / Self Care | Payer: Commercial Managed Care - PPO | Source: Ambulatory Visit | Attending: Cardiology | Admitting: Cardiology

## 2019-02-08 DIAGNOSIS — Z01812 Encounter for preprocedural laboratory examination: Secondary | ICD-10-CM | POA: Insufficient documentation

## 2019-02-08 DIAGNOSIS — R9439 Abnormal result of other cardiovascular function study: Secondary | ICD-10-CM

## 2019-02-08 DIAGNOSIS — I2089 Other forms of angina pectoris: Secondary | ICD-10-CM

## 2019-02-08 DIAGNOSIS — I208 Other forms of angina pectoris: Secondary | ICD-10-CM

## 2019-02-08 DIAGNOSIS — Z20822 Contact with and (suspected) exposure to covid-19: Secondary | ICD-10-CM | POA: Diagnosis not present

## 2019-02-08 LAB — BASIC METABOLIC PANEL
BUN/Creatinine Ratio: 20 (ref 9–20)
BUN: 21 mg/dL (ref 6–24)
CO2: 29 mmol/L (ref 20–29)
Calcium: 10.2 mg/dL (ref 8.7–10.2)
Chloride: 97 mmol/L (ref 96–106)
Creatinine, Ser: 1.04 mg/dL (ref 0.76–1.27)
GFR calc Af Amer: 95 mL/min/{1.73_m2} (ref >59)
GFR calc non Af Amer: 82 mL/min/{1.73_m2} (ref >59)
Glucose: 178 mg/dL — ABNORMAL HIGH (ref 65–99)
Potassium: 4.5 mmol/L (ref 3.5–5.2)
Sodium: 140 mmol/L (ref 134–144)

## 2019-02-08 LAB — CBC
Hematocrit: 42.6 % (ref 37.5–51.0)
Hemoglobin: 15.5 g/dL (ref 13.0–17.7)
MCH: 30.7 pg (ref 26.6–33.0)
MCHC: 36.4 g/dL — ABNORMAL HIGH (ref 31.5–35.7)
MCV: 84 fL (ref 79–97)
Platelets: 213 10*3/uL (ref 150–450)
RBC: 5.05 x10E6/uL (ref 4.14–5.80)
RDW: 12.8 % (ref 11.6–15.4)
WBC: 9.5 10*3/uL (ref 3.4–10.8)

## 2019-02-09 LAB — SARS CORONAVIRUS 2 (TAT 6-24 HRS): SARS Coronavirus 2: NEGATIVE

## 2019-02-11 ENCOUNTER — Telehealth: Payer: Self-pay | Admitting: *Deleted

## 2019-02-11 NOTE — Telephone Encounter (Signed)
Pt contacted pre-catheterization scheduled at [facility] for: Tuesday February 12, 2019 7:30 AM Verified arrival time and place: [facility]) at: 5:30 AM   No solid food after midnight prior to cath, clear liquids until 5 AM day of procedure. Contrast allergy: no  Hold: Metformin-day of procedure and 48 hours post procedure. Cialis-until post procedure. Chlorthalidone-AM of procedure.  Except hold medications AM meds can be  taken pre-cath with sip of water including: ASA 81 mg   Confirmed patient has responsible adult to drive home post procedure and observe 24 hours after arriving home: yes  Currently, due to Covid-19 pandemic, only one support person will be allowed with patient. Must be the same support person for that patient's entire stay and will be required to wear a mask. They will be asked to wait in the waiting room for the duration of the patient's stay.  Patients are required to wear a mask when they enter the hospital.       COVID-19 Pre-Screening Questions:  . In the past 7 to 10 days have you had a cough,  shortness of breath, headache, congestion, fever (100 or greater) body aches, chills, sore throat, or sudden loss of taste or sense of smell? no . Have you been around anyone with known Covid 19? no . Have you been around anyone who is awaiting Covid 19 test results in the past 7 to 10 days? no . Have you been around anyone who has been exposed to Covid 19, or has mentioned symptoms of Covid 19 within the past 7 to 10 days? no   I reviewed procedure/mask/visitor, Covid-19 screening questions with patient, he verbalized understanding, thanked me for call.

## 2019-02-12 ENCOUNTER — Ambulatory Visit (HOSPITAL_COMMUNITY)
Admission: RE | Admit: 2019-02-12 | Discharge: 2019-02-12 | Disposition: A | Discharge status: Home / Self Care | Payer: Commercial Managed Care - PPO | Attending: Cardiology | Admitting: Cardiology

## 2019-02-12 ENCOUNTER — Other Ambulatory Visit: Payer: Self-pay

## 2019-02-12 ENCOUNTER — Encounter (HOSPITAL_COMMUNITY)
Admission: RE | Disposition: A | Discharge status: Home / Self Care | Payer: Commercial Managed Care - PPO | Source: Home / Self Care | Attending: Cardiology

## 2019-02-12 DIAGNOSIS — Z79899 Other long term (current) drug therapy: Secondary | ICD-10-CM | POA: Diagnosis not present

## 2019-02-12 DIAGNOSIS — Z87891 Personal history of nicotine dependence: Secondary | ICD-10-CM | POA: Diagnosis not present

## 2019-02-12 DIAGNOSIS — R9439 Abnormal result of other cardiovascular function study: Secondary | ICD-10-CM | POA: Diagnosis not present

## 2019-02-12 DIAGNOSIS — G4733 Obstructive sleep apnea (adult) (pediatric): Secondary | ICD-10-CM | POA: Diagnosis not present

## 2019-02-12 DIAGNOSIS — Z6833 Body mass index (BMI) 33.0-33.9, adult: Secondary | ICD-10-CM | POA: Insufficient documentation

## 2019-02-12 DIAGNOSIS — K219 Gastro-esophageal reflux disease without esophagitis: Secondary | ICD-10-CM | POA: Insufficient documentation

## 2019-02-12 DIAGNOSIS — I2510 Atherosclerotic heart disease of native coronary artery without angina pectoris: Secondary | ICD-10-CM | POA: Diagnosis present

## 2019-02-12 DIAGNOSIS — Z8249 Family history of ischemic heart disease and other diseases of the circulatory system: Secondary | ICD-10-CM | POA: Insufficient documentation

## 2019-02-12 DIAGNOSIS — I208 Other forms of angina pectoris: Secondary | ICD-10-CM | POA: Diagnosis not present

## 2019-02-12 DIAGNOSIS — I2089 Other forms of angina pectoris: Secondary | ICD-10-CM | POA: Diagnosis present

## 2019-02-12 DIAGNOSIS — E119 Type 2 diabetes mellitus without complications: Secondary | ICD-10-CM | POA: Insufficient documentation

## 2019-02-12 DIAGNOSIS — I10 Essential (primary) hypertension: Secondary | ICD-10-CM | POA: Diagnosis not present

## 2019-02-12 DIAGNOSIS — E781 Pure hyperglyceridemia: Secondary | ICD-10-CM | POA: Insufficient documentation

## 2019-02-12 DIAGNOSIS — Z7984 Long term (current) use of oral hypoglycemic drugs: Secondary | ICD-10-CM | POA: Insufficient documentation

## 2019-02-12 HISTORY — PX: LEFT HEART CATH AND CORONARY ANGIOGRAPHY: CATH118249

## 2019-02-12 LAB — GLUCOSE, CAPILLARY: Glucose-Capillary: 214 mg/dL — ABNORMAL HIGH (ref 70–99)

## 2019-02-12 SURGERY — LEFT HEART CATH AND CORONARY ANGIOGRAPHY
Anesthesia: LOCAL

## 2019-02-12 MED ORDER — MIDAZOLAM HCL 2 MG/2ML IJ SOLN
INTRAMUSCULAR | Status: DC | PRN
  Administered 2019-02-12: 2 mg via INTRAVENOUS

## 2019-02-12 MED ORDER — LIDOCAINE HCL (PF) 1 % IJ SOLN
INTRAMUSCULAR | Status: AC
  Filled 2019-02-12: qty 30

## 2019-02-12 MED ORDER — HEPARIN (PORCINE) IN NACL 1000-0.9 UT/500ML-% IV SOLN
INTRAVENOUS | Status: AC
  Filled 2019-02-12: qty 1000

## 2019-02-12 MED ORDER — FENTANYL CITRATE (PF) 100 MCG/2ML IJ SOLN
INTRAMUSCULAR | Status: AC
  Filled 2019-02-12: qty 2

## 2019-02-12 MED ORDER — ACETAMINOPHEN 500 MG PO TABS
ORAL_TABLET | ORAL | Status: AC
  Administered 2019-02-12: 1000 mg via ORAL
  Filled 2019-02-12: qty 2

## 2019-02-12 MED ORDER — IOHEXOL 350 MG/ML SOLN
INTRAVENOUS | Status: DC | PRN
  Administered 2019-02-12: 90 mL

## 2019-02-12 MED ORDER — ACETAMINOPHEN 325 MG PO TABS: 650.0000 mg | ORAL_TABLET | ORAL | Status: DC | PRN

## 2019-02-12 MED ORDER — HEPARIN (PORCINE) IN NACL 1000-0.9 UT/500ML-% IV SOLN
INTRAVENOUS | Status: DC | PRN
  Administered 2019-02-12 (×2): 500 mL

## 2019-02-12 MED ORDER — VERAPAMIL HCL 2.5 MG/ML IV SOLN
INTRAVENOUS | Status: AC
  Filled 2019-02-12: qty 2

## 2019-02-12 MED ORDER — LIDOCAINE HCL (PF) 1 % IJ SOLN
INTRAMUSCULAR | Status: DC | PRN
  Administered 2019-02-12: 3 mL

## 2019-02-12 MED ORDER — ACETAMINOPHEN 500 MG PO TABS: 1000.0000 mg | ORAL_TABLET | Freq: Once | ORAL | Status: AC

## 2019-02-12 MED ORDER — SODIUM CHLORIDE 0.9 % WEIGHT BASED INFUSION: 1.0000 mL/kg/h | INTRAVENOUS | Status: DC

## 2019-02-12 MED ORDER — NITROGLYCERIN 1 MG/10 ML FOR IR/CATH LAB
INTRA_ARTERIAL | Status: DC | PRN
  Administered 2019-02-12: 200 ug via INTRACORONARY

## 2019-02-12 MED ORDER — SODIUM CHLORIDE 0.9% FLUSH: 3.0000 mL | INTRAVENOUS | Status: DC | PRN

## 2019-02-12 MED ORDER — SODIUM CHLORIDE 0.9 % WEIGHT BASED INFUSION: 3.0000 mL/kg/h | INTRAVENOUS | Status: AC

## 2019-02-12 MED ORDER — ONDANSETRON HCL 4 MG/2ML IJ SOLN: 4.0000 mg | Freq: Four times a day (QID) | INTRAMUSCULAR | Status: DC | PRN

## 2019-02-12 MED ORDER — SODIUM CHLORIDE 0.9% FLUSH: 3.0000 mL | Freq: Two times a day (BID) | INTRAVENOUS | Status: DC

## 2019-02-12 MED ORDER — SODIUM CHLORIDE 0.9 % IV SOLN: 250.0000 mL | INTRAVENOUS | Status: DC | PRN

## 2019-02-12 MED ORDER — LABETALOL HCL 5 MG/ML IV SOLN: 10.0000 mg | INTRAVENOUS | Status: DC | PRN

## 2019-02-12 MED ORDER — FENTANYL CITRATE (PF) 100 MCG/2ML IJ SOLN
INTRAMUSCULAR | Status: DC | PRN
  Administered 2019-02-12: 50 ug via INTRAVENOUS

## 2019-02-12 MED ORDER — HEPARIN SODIUM (PORCINE) 1000 UNIT/ML IJ SOLN
INTRAMUSCULAR | Status: DC | PRN
  Administered 2019-02-12: 5000 [IU] via INTRAVENOUS

## 2019-02-12 MED ORDER — HEPARIN SODIUM (PORCINE) 1000 UNIT/ML IJ SOLN
INTRAMUSCULAR | Status: AC
  Filled 2019-02-12: qty 1

## 2019-02-12 MED ORDER — NITROGLYCERIN 1 MG/10 ML FOR IR/CATH LAB
INTRA_ARTERIAL | Status: AC
  Filled 2019-02-12: qty 10

## 2019-02-12 MED ORDER — SODIUM CHLORIDE 0.9 % IV SOLN: INTRAVENOUS | Status: AC

## 2019-02-12 MED ORDER — VERAPAMIL HCL 2.5 MG/ML IV SOLN
INTRAVENOUS | Status: DC | PRN
  Administered 2019-02-12: 10 mL via INTRA_ARTERIAL

## 2019-02-12 MED ORDER — MIDAZOLAM HCL 2 MG/2ML IJ SOLN
INTRAMUSCULAR | Status: AC
  Filled 2019-02-12: qty 2

## 2019-02-12 SURGICAL SUPPLY — 12 items
CATH INFINITI 5 FR JL3.5 (CATHETERS) ×1 IMPLANT
CATH INFINITI 5FR ANG PIGTAIL (CATHETERS) ×1 IMPLANT
CATH OPTITORQUE TIG 4.0 5F (CATHETERS) ×1 IMPLANT
DEVICE RAD COMP TR BAND LRG (VASCULAR PRODUCTS) ×1 IMPLANT
GLIDESHEATH SLEND A-KIT 6F 22G (SHEATH) ×1 IMPLANT
GUIDEWIRE INQWIRE 1.5J.035X260 (WIRE) IMPLANT
INQWIRE 1.5J .035X260CM (WIRE) ×2
KIT HEART LEFT (KITS) ×2 IMPLANT
PACK CARDIAC CATHETERIZATION (CUSTOM PROCEDURE TRAY) ×2 IMPLANT
SHEATH PROBE COVER 6X72 (BAG) ×1 IMPLANT
TRANSDUCER W/STOPCOCK (MISCELLANEOUS) ×2 IMPLANT
TUBING CIL FLEX 10 FLL-RA (TUBING) ×2 IMPLANT

## 2019-02-12 NOTE — Interval H&P Note (Signed)
History and Physical Interval Note:  Cath Lab Visit (complete for each Cath Lab visit)  Clinical Evaluation Leading to the Procedure:   ACS: No.  Non-ACS:    Anginal Classification: CCS II  Anti-ischemic medical therapy: Minimal Therapy (1 class of medications)  Non-Invasive Test Results: Intermediate-risk stress test findings: cardiac mortality 1-3%/year  Prior CABG: No previous CABG  02/12/2019 7:22 AM  Robert Franklin  has presented today for surgery, with the diagnosis of Abnormal stress test.  The various methods of treatment have been discussed with the patient and family. After consideration of risks, benefits and other options for treatment, the patient has consented to  Procedure(s): LEFT HEART CATH AND CORONARY ANGIOGRAPHY (N/A) as a surgical intervention.  The patient's history has been reviewed, patient examined, no change in status, stable for surgery.  I have reviewed the patient's chart and labs.  Questions were answered to the patient's satisfaction.     Glenetta Hew

## 2019-02-12 NOTE — Discharge Instructions (Signed)
Radial Site Care  This sheet gives you information about how to care for yourself after your procedure. Your health care provider may also give you more specific instructions. If you have problems or questions, contact your health care provider. What can I expect after the procedure? After the procedure, it is common to have:  Bruising and tenderness at the catheter insertion area. Follow these instructions at home: Medicines  Take over-the-counter and prescription medicines only as told by your health care provider. Insertion site care  Follow instructions from your health care provider about how to take care of your insertion site. Make sure you: ? Wash your hands with soap and water before you change your bandage (dressing). If soap and water are not available, use hand sanitizer. ? Change your dressing as told by your health care provider. ? Leave stitches (sutures), skin glue, or adhesive strips in place. These skin closures may need to stay in place for 2 weeks or longer. If adhesive strip edges start to loosen and curl up, you may trim the loose edges. Do not remove adhesive strips completely unless your health care provider tells you to do that.  Check your insertion site every day for signs of infection. Check for: ? Redness, swelling, or pain. ? Fluid or blood. ? Pus or a bad smell. ? Warmth.  Do not take baths, swim, or use a hot tub until your health care provider approves.  You may shower 24-48 hours after the procedure, or as directed by your health care provider. ? Remove the dressing and gently wash the site with plain soap and water. ? Pat the area dry with a clean towel. ? Do not rub the site. That could cause bleeding.  Do not apply powder or lotion to the site. Activity   For 24 hours after the procedure, or as directed by your health care provider: ? Do not flex or bend the affected arm. ? Do not push or pull heavy objects with the affected arm. ? Do not  drive yourself home from the hospital or clinic. You may drive 24 hours after the procedure unless your health care provider tells you not to. ? Do not operate machinery or power tools.  Do not lift anything that is heavier than 10 lb (4.5 kg), or the limit that you are told, until your health care provider says that it is safe.  Ask your health care provider when it is okay to: ? Return to work or school. ? Resume usual physical activities or sports. ? Resume sexual activity. General instructions  If the catheter site starts to bleed, raise your arm and put firm pressure on the site. If the bleeding does not stop, get help right away. This is a medical emergency.  If you went home on the same day as your procedure, a responsible adult should be with you for the first 24 hours after you arrive home.  Keep all follow-up visits as told by your health care provider. This is important. Contact a health care provider if:  You have a fever.  You have redness, swelling, or yellow drainage around your insertion site. Get help right away if:  You have unusual pain at the radial site.  The catheter insertion area swells very fast.  The insertion area is bleeding, and the bleeding does not stop when you hold steady pressure on the area.  Your arm or hand becomes pale, cool, tingly, or numb. These symptoms may represent a serious problem   that is an emergency. Do not wait to see if the symptoms will go away. Get medical help right away. Call your local emergency services (911 in the [place] Do not drive yourself to the hospital. Summary  After the procedure, it is common to have bruising and tenderness at the site.  Follow instructions from your health care provider about how to take care of your radial site wound. Check the wound every day for signs of infection.  Do not lift anything that is heavier than 10 lb (4.5 kg), or the limit that you are told, until your health care provider says  that it is safe. This information is not intended to replace advice given to you by your health care provider. Make sure you discuss any questions you have with your health care provider. Document Revised: 02/15/2017 Document Reviewed: 02/15/2017 Elsevier Patient Education  2020 [employer]vier Inc.  

## 2019-02-26 ENCOUNTER — Other Ambulatory Visit: Payer: Self-pay

## 2019-02-26 ENCOUNTER — Ambulatory Visit: Payer: Commercial Managed Care - PPO | Admitting: Gastroenterology

## 2019-02-26 ENCOUNTER — Encounter: Payer: Self-pay | Admitting: Gastroenterology

## 2019-02-26 VITALS — BP 117/80 | HR 78 | Temp 98.0°F | Ht 69.0 in | Wt 233.6 lb

## 2019-02-26 DIAGNOSIS — R1030 Lower abdominal pain, unspecified: Secondary | ICD-10-CM

## 2019-02-26 DIAGNOSIS — K580 Irritable bowel syndrome with diarrhea: Secondary | ICD-10-CM | POA: Diagnosis not present

## 2019-02-26 DIAGNOSIS — R140 Abdominal distension (gaseous): Secondary | ICD-10-CM

## 2019-02-26 NOTE — Progress Notes (Signed)
Cardiology Office Note   Date:  02/27/2019   ID:  Robert Franklin [birth year], MRN [id number]  PCP:  [name]., MD  Cardiologist:  Dr. Ellyn Hack  CC: Follow up post cath   History of Present Illness: Robert Franklin is a 54 y.o. male who presents for post hospital follow up after having abnormal stress test on 01/10/2019 which revealed intermediate risk with a moderated sized mostly fixed perfusion defect with the anterior apical segment. Due to symptoms that were concerning to include more fatigue, decreased energy and exercise intolerance, cardiac cath was planned   [facility] on 02/12/2019 demonstrated large ectatic/aneurymal dominant Cx with mild to moderate disease, normal caliber LAD tapering into he apex with mild spasm, small caliber nondominant RCA. Normal LVEF.  Dr. Ellyn Hack recommended CCB for distal spasm.   Other history included GI pain with significant weight loss. He is being followed by GI for these symptoms. Due to weight loss and stable BP, chlorthalidone was decreased to 25 mg daily.   He comes today without any cardiac complaint.  He continues to have complaints of neck pain headache and abdominal pain.  He is being followed by his PCP and GI specialist for the symptoms.  He is reassured about his coronary arteries based upon cath results.  He is medically compliant.    Past Medical History:  Diagnosis Date  . Coronary artery disease, non-occlusive 02/28/2008   Dr. Humphrey Rolls @ [facility]: 2 x 40-50% lesions in dominant LCx lesion by cath,  EF 60% with normal wall motion.  . Diabetes mellitus without complication ([place])   . Essential hypertension   . GERD (gastroesophageal reflux disease)   . Headache   . High triglycerides     was previously on treatment, but  he notes that this was stopped  . Kidney stones   . Moderate obesity 05/29/2014  . OSA treated with BiPAP   . Short bowel syndrome 1994    Past Surgical History:  Procedure Laterality Date  . "stomach  wrap"  1991   GERD  . APPENDECTOMY    . CARDIAC CATHETERIZATION  02/2008   Dr. Humphrey Rolls @ [facility]: 2 x 40-50% lesions in a dominant LCx by cath,  EF 60% with normal wall motion.  [name] Nuclear Stress Test   January 2010    heart rate increased from 80-158 BPM. No EKG changes.  there is a moderate sized  reversible defect in the inferior and inferior lateral wall as well as anteroseptal. EF 59%.  . CardioNet Holter Monitor   January 2010    sinus rhythm. Rare PVCs in setting was. Current PACs , seen in singles and pairs. Max heart rate 127, minimum heart rate 57. Average 84.  . CHOLECYSTECTOMY    . COLON SURGERY     due to gangrenous appendix  . COLONOSCOPY WITH PROPOFOL N/A 04/26/2016   Procedure: COLONOSCOPY WITH PROPOFOL;  Surgeon: Jonathon Bellows, MD;  Location: [facility] ENDOSCOPY;  Service: Endoscopy;  Laterality: N/A;  . ESOPHAGOGASTRODUODENOSCOPY (EGD) WITH PROPOFOL N/A 01/11/2019   Procedure: ESOPHAGOGASTRODUODENOSCOPY (EGD) WITH PROPOFOL;  Surgeon: Jonathon Bellows, MD;  Location: [facility] ENDOSCOPY;  Service: Gastroenterology;  Laterality: N/A;  . FLEXIBLE SIGMOIDOSCOPY N/A 01/11/2019   Procedure: FLEXIBLE SIGMOIDOSCOPY;  Surgeon: Jonathon Bellows, MD;  Location: [facility] ENDOSCOPY;  Service: Gastroenterology;  Laterality: N/A;  . HERNIA REPAIR  2010  . INNER EAR SURGERY    . LEFT HEART CATH AND CORONARY ANGIOGRAPHY N/A 02/12/2019   Procedure: LEFT HEART CATH AND  CORONARY ANGIOGRAPHY;  Surgeon: Leonie Man, MD;  Location: [facility];  Service: Cardiovascular;  Laterality: N/A;  . NM MYOVIEW LTD  01/10/2019   Treadmill Myoview: Walk 9:40 min-10.1 METS) EF ~45 to 50%.  INTERMEDIATE RISK STUDY-moderate sized mostly fixed perfusion defect in anterior apical segment.  No reversibility.  Suggests prior infarct without ischemia.  . TONSILLECTOMY    . TYMPANOSTOMY TUBE PLACEMENT Bilateral 1973     Current Outpatient Medications  Medication Sig Dispense Refill  . acetaminophen (TYLENOL) 500 MG tablet  Take 1,000 mg by mouth every 8 (eight) hours as needed for moderate pain or headache.    Marland Kitchen aspirin EC 81 MG tablet Take 81 mg by mouth daily.    Marland Kitchen bismuth subsalicylate (PEPTO BISMOL) 262 MG/15ML suspension Take 30 mLs by mouth every 6 (six) hours as needed for indigestion.    . butalbital-acetaminophen-caffeine (FIORICET) 50-325-40 MG tablet TAKE 1 TABLET BY MOUTH 2 (TWO) TIMES DAILY AS NEEDED FOR HEADACHE. 30 tablet 4  . chlorthalidone (HYGROTON) 50 MG tablet Take 0.5 tablets (25 mg total) by mouth daily. 90 tablet 3  . CINNAMON PO Take 1,200 mg by mouth daily.    . COLLAGEN PO Take 6,000 mg by mouth daily.    . fenofibrate 160 MG tablet Take 1 tablet (160 mg total) by mouth daily. 90 tablet 3  . Homeopathic Products (THERAWORX RELIEF) FOAM Apply 1 application topically daily as needed (pain/cramps).    . hyoscyamine (LEVSIN) 0.125 MG tablet TAKE 1 TABLET BY MOUTH 4 TIMES A DAY AS NEEDED FOR PAIN (Patient taking differently: Take 0.125 mg by mouth 4 (four) times daily as needed (pain). ) 360 tablet 0  . losartan (COZAAR) 100 MG tablet TAKE 1 TABLET BY MOUTH EVERY DAY. TAKE WITH HCTZ (Patient taking differently: Take 100 mg by mouth daily. ) 90 tablet 3  . Magnesium 100 MG CAPS Take 200 mg by mouth daily.    . Menthol, Topical Analgesic, (BIOFREEZE EX) Apply 1 application topically daily as needed (pain).    . metFORMIN (GLUCOPHAGE) 1000 MG tablet TAKE 1 TABLET (1,000 MG TOTAL) BY MOUTH 2 (TWO) TIMES DAILY WITH A MEAL. 180 tablet 1  . metoprolol succinate (TOPROL-XL) 100 MG 24 hr tablet Take 1 tablet (100 mg total) by mouth daily. Take with or immediately following a meal. 90 tablet 3  . metroNIDAZOLE (FLAGYL) 500 MG tablet Take 1 tablet (500 mg total) by mouth 2 (two) times daily. 14 tablet 0  . naproxen sodium (ALEVE) 220 MG tablet Take 440 mg by mouth at bedtime as needed (pain).    Marland Kitchen omeprazole (PRILOSEC OTC) 20 MG tablet Take 1 tablet (20 mg total) by mouth daily. 30 tablet 12  . ondansetron  (ZOFRAN-ODT) 4 MG disintegrating tablet Take 1 tablet (4 mg total) by mouth every 8 (eight) hours as needed for nausea or vomiting. 30 tablet 0  . Saw Palmetto, Serenoa repens, (SAW PALMETTO PO) Take 500 mg by mouth daily.    . Simethicone (GAS-X EXTRA STRENGTH) 125 MG CAPS Take 125 mg by mouth daily as needed (gas).    . tadalafil (CIALIS) 20 MG tablet TAKE 0.5 TABLETS (10 MG TOTAL) BY MOUTH DAILY AS NEEDED FOR ERECTILE DYSFUNCTION. (Patient taking differently: Take 20 mg by mouth daily as needed for erectile dysfunction. ) 10 tablet 5  . VASCEPA 1 g capsule TAKE 2 CAPSULES (2 G TOTAL) BY MOUTH 2 (TWO) TIMES DAILY. (Patient taking differently: Take 2 g by mouth 2 (  two) times daily. ) 120 capsule 11  . vitamin C (ASCORBIC ACID) 250 MG tablet Take 1,000 mg by mouth daily.    Marland Kitchen dicyclomine (BENTYL) 20 MG tablet Take 1 tablet (20 mg total) by mouth every 6 (six) hours as needed for up to 7 days for spasms. 28 tablet 0  . rosuvastatin (CRESTOR) 20 MG tablet Take 1 tablet (20 mg total) by mouth daily. 90 tablet 3   No current facility-administered medications for this visit.    Allergies:   Mucinex [guaifenesin er]    Social History:  The patient  reports that he quit smoking about 22 years ago. His smoking use included cigarettes. He has a 28.00 pack-year smoking history. He has never used smokeless tobacco. He reports that he does not drink alcohol or use drugs.   Family History:  The patient's family history includes Heart attack in his maternal grandfather, maternal uncle, and mother; Hypertension in his mother; Irritable bowel syndrome in his mother; Lumbar disc disease in his mother; Migraines in his mother.    ROS: All other systems are reviewed and negative. Unless otherwise mentioned in H&P    PHYSICAL EXAM: VS:  BP 114/77   Pulse 97   Temp 98.1 F (36.7 C)   Ht 5\' 9"  (1.753 m)   Wt 233 lb 3.2 oz (105.8 kg)   SpO2 96%   BMI 34.44 kg/m  , BMI Body mass index is 34.44 kg/m. GEN:  Well nourished, well developed, in no acute distress HEENT: normal Neck: no JVD, carotid bruits, or masses Cardiac: RRR; no murmurs, rubs, or gallops,no edema  Respiratory:  Clear to auscultation bilaterally, normal work of breathing GI: soft, nontender, nondistended, + BS MS: no deformity or atrophy Skin: warm and dry, no rash Neuro:  Strength and sensation are intact Psych: euthymic mood, full affect   EKG: Not completed this office visit  Recent Labs: 08/07/2018: TSH 1.740 01/02/2019: ALT 35 02/07/2019: BUN 21; Creatinine, Ser 1.04; Hemoglobin 15.5; Platelets 213; Potassium 4.5; Sodium 140    Lipid Panel    Component Value Date/Time   CHOL 155 01/02/2019 1113   TRIG 431 (H) 01/02/2019 1113   HDL 31 (L) 01/02/2019 1113   CHOLHDL 5.0 01/02/2019 1113   LDLCALC 58 01/02/2019 1113   LDLDIRECT 46 07/05/2018 1405      Wt Readings from Last 3 Encounters:  02/27/19 233 lb 3.2 oz (105.8 kg)  02/26/19 233 lb 9.6 oz (106 kg)  02/12/19 220 lb (99.8 kg)      Other studies Reviewed: LHC 02/12/2019   Prox RCA lesion is 40% stenosed.  Diffuse aneurysmal/ectatic dominant circumflex with no significant lesions. What was considered to be 40 to 50% lesions are probably just normal diameter with surrounding ectasia and aneurysmal segments.  The left ventricular systolic function is normal. The left ventricular ejection fraction is 55-65% by visual estimate.  LV end diastolic pressure is normal. There is no aortic valve stenosis.   ASSESSMENT AND PLAN:  1.  Coronary artery disease: Nonobstructive per cardiac catheterization on 02/12/2019.  Continue secondary prevention with blood pressure control, statin therapy, increase exercise, and weight loss.  See him annually.   2. Hypertension: Currently well controlled. Now on losartan 100 mg daily, metoprolol 100 mg daily,  with 25 mg of chlorthalidone.   3. Hypertriglyceridemia: On Vesepa. Followed by Dr. Debara Pickett.   4. Hyperlipidemia: On  rosuvastatin. Goal of LDL < 70.   5.Diabetes: Followed by PCP   Current medicines are reviewed  at length with the patient today.    Labs/ tests ordered today include: Followed by PCP  Phill Myron. [place] Pugh, ANP, AACC   02/27/2019 4:52 PM    [facility] [address] Office [phone] Fax [phone]  Notice: This dictation was prepared with Dragon dictation along with smaller phrase technology. Any transcriptional errors that result from this process are unintentional and may not be corrected upon review.

## 2019-02-26 NOTE — Progress Notes (Signed)
Jonathon Bellows MD, MRCP(U.K) [address]  [place]  [place], [place] [address]  Main: [phone]  Fax: [phone]   Primary Care Physician: [name]., MD  Primary Gastroenterologist:  Dr. Jonathon Bellows   Here to follow-up for abdominal pain.  HPI: Kole Trivedi is a 54 y.o. male    Summary of history : Was initially referred and seen on 01/02/2019 after he presented to the emergency room in 10/14/2018 with nausea vomiting diarrhea and abdominal pain.  Prior history of appendectomy and cholecystectomy.  At the time of his appendectomy he had had extensive surgery as his appendix had ruptured.  In the ER underwent a CT scan of the abdomen. which showed mild long segment wall thickening involving the rectum and sigmoid colon suggesting a nonspecific colitis.  Few "scattered colonic diverticula without inflammation to suggest acute diverticulitis.At that time he had a fever.  Given ciprofloxacin and Flagyl and discharged home.  He returned back to the ER 4 days later on 10/17/2018.  Had up to 12 episodes of loose stools. Test for C. difficile was negative.  Test for GI PCR was also negative.  Hemoglobin 15.2 g..  Since 10/14/2018 he has been having on and off lower abdominal pain cramping in nature occurring almost every day lasting for a few seconds.  Nonradiating.  Worse while eating.  Not really relieved by defecation.    Had been taking Advil once a week, abdominal pain associated with gas and bloating.  He did feel that on days when he has more bloating the pain is more intense.  He had noticed black tarry stools on occasions.  Takes Prilosec but at night just before bedtime.    Was consuming a lot of artificial sweeteners at his initial visit  I had performed his last colonoscopy over 2 years back for screening and noted an ileocolonic anastomosis and 2 benign polyps resected.   Interval history   01/02/2019-02/26/2019  01/02/2019: H. pylori breath test negative, CBC  normal, CMP normal except an elevated glucose of 214.  Lipase normal.  01/11/2019: EGD: Features of gastritis seen on endoscopy biopsies of which were taken and showed no active inflammation.  Biopsies of the duodenum were negative for celiac disease.  At the same time  Flexible sigmoidoscopy performed the mucosa of the colon appeared completely normal biopsies of which were also taken and showed no abnormalities  He states that he has stopped taking all the Advil.  No black tarry stools.  Still having a lot of bloating and abdominal discomfort which limits the amount of food he can take.  Not sure if he is taking the Levsin.  He has been having floppy soft stools for a long time.  Stopped all artificial sugars.  Current Outpatient Medications  Medication Sig Dispense Refill  . acetaminophen (TYLENOL) 500 MG tablet Take 1,000 mg by mouth every 8 (eight) hours as needed for moderate pain or headache.    Marland Kitchen aspirin EC 81 MG tablet Take 81 mg by mouth daily.    Marland Kitchen bismuth subsalicylate (PEPTO BISMOL) 262 MG/15ML suspension Take 30 mLs by mouth every 6 (six) hours as needed for indigestion.    . butalbital-acetaminophen-caffeine (FIORICET) 50-325-40 MG tablet TAKE 1 TABLET BY MOUTH 2 (TWO) TIMES DAILY AS NEEDED FOR HEADACHE. 30 tablet 4  . chlorthalidone (HYGROTON) 50 MG tablet Take 0.5 tablets (25 mg total) by mouth daily. 90 tablet 3  . CINNAMON PO Take 1,200 mg by mouth daily.    Marland Kitchen  COLLAGEN PO Take 6,000 mg by mouth daily.    Marland Kitchen dicyclomine (BENTYL) 20 MG tablet Take 1 tablet (20 mg total) by mouth every 6 (six) hours as needed for up to 7 days for spasms. 28 tablet 0  . fenofibrate 160 MG tablet Take 1 tablet (160 mg total) by mouth daily. 90 tablet 3  . Homeopathic Products (THERAWORX RELIEF) FOAM Apply 1 application topically daily as needed (pain/cramps).    . hyoscyamine (LEVSIN) 0.125 MG tablet TAKE 1 TABLET BY MOUTH 4 TIMES A DAY AS NEEDED FOR PAIN (Patient taking differently: Take 0.125 mg by  mouth 4 (four) times daily as needed (pain). ) 360 tablet 0  . losartan (COZAAR) 100 MG tablet TAKE 1 TABLET BY MOUTH EVERY DAY. TAKE WITH HCTZ (Patient taking differently: Take 100 mg by mouth daily. ) 90 tablet 3  . Magnesium 100 MG CAPS Take 200 mg by mouth daily.    . Menthol, Topical Analgesic, (BIOFREEZE EX) Apply 1 application topically daily as needed (pain).    . metFORMIN (GLUCOPHAGE) 1000 MG tablet TAKE 1 TABLET (1,000 MG TOTAL) BY MOUTH 2 (TWO) TIMES DAILY WITH A MEAL. 180 tablet 1  . metoprolol succinate (TOPROL-XL) 100 MG 24 hr tablet Take 1 tablet (100 mg total) by mouth daily. Take with or immediately following a meal. 90 tablet 3  . metroNIDAZOLE (FLAGYL) 500 MG tablet Take 1 tablet (500 mg total) by mouth 2 (two) times daily. (Patient not taking: Reported on 02/08/2019) 14 tablet 0  . naproxen sodium (ALEVE) 220 MG tablet Take 440 mg by mouth at bedtime as needed (pain).    Marland Kitchen omeprazole (PRILOSEC OTC) 20 MG tablet Take 1 tablet (20 mg total) by mouth daily. 30 tablet 12  . ondansetron (ZOFRAN-ODT) 4 MG disintegrating tablet Take 1 tablet (4 mg total) by mouth every 8 (eight) hours as needed for nausea or vomiting. 30 tablet 0  . rosuvastatin (CRESTOR) 20 MG tablet Take 1 tablet (20 mg total) by mouth daily. 90 tablet 3  . Saw Palmetto, Serenoa repens, (SAW PALMETTO PO) Take 500 mg by mouth daily.    . Simethicone (GAS-X EXTRA STRENGTH) 125 MG CAPS Take 125 mg by mouth daily as needed (gas).    . tadalafil (CIALIS) 20 MG tablet TAKE 0.5 TABLETS (10 MG TOTAL) BY MOUTH DAILY AS NEEDED FOR ERECTILE DYSFUNCTION. (Patient taking differently: Take 20 mg by mouth daily as needed for erectile dysfunction. ) 10 tablet 5  . VASCEPA 1 g capsule TAKE 2 CAPSULES (2 G TOTAL) BY MOUTH 2 (TWO) TIMES DAILY. (Patient taking differently: Take 2 g by mouth 2 (two) times daily. ) 120 capsule 11  . vitamin C (ASCORBIC ACID) 250 MG tablet Take 1,000 mg by mouth daily.     No current facility-administered  medications for this visit.    Allergies as of 02/26/2019 - Review Complete 02/12/2019  Allergen Reaction Noted  . Mucinex [guaifenesin er] Other (See Comments) 05/29/2014    ROS:  General: Negative for anorexia, weight loss, fever, chills, fatigue, weakness. ENT: Negative for hoarseness, difficulty swallowing , nasal congestion. CV: Negative for chest pain, angina, palpitations, dyspnea on exertion, peripheral edema.  Respiratory: Negative for dyspnea at rest, dyspnea on exertion, cough, sputum, wheezing.  GI: See history of present illness. GU:  Negative for dysuria, hematuria, urinary incontinence, urinary frequency, nocturnal urination.  Endo: Negative for unusual weight change.    Physical Examination:   There were no vitals taken for this visit.  General:  Well-nourished, well-developed in no acute distress.  Eyes: No icterus. Conjunctivae pink. Abdomen: Bowel sounds are normal, nontender, nondistended, no hepatosplenomegaly or masses, no abdominal bruits or hernia , no rebound or guarding.   Neuro: Alert and oriented x 3.  Grossly intact. Psych: Alert and cooperative, normal mood and affect.   Imaging Studies: CARDIAC CATHETERIZATION  Result Date: 02/12/2019  Prox RCA lesion is 40% stenosed.  Diffuse aneurysmal/ectatic dominant circumflex with no significant lesions. What was considered to be 40 to 50% lesions are probably just normal diameter with surrounding ectasia and aneurysmal segments.  The left ventricular systolic function is normal. The left ventricular ejection fraction is 55-65% by visual estimate.  LV end diastolic pressure is normal.  There is no aortic valve stenosis.  SUMMARY  LIKELY FALSE POSITIVE STRESS TEST: No significant coronary stenosis noted.  Large ectatic/aneurysmal dominant circumflex with mild to moderate disease  Normal caliber LAD then tapers to the apex with mild spasm.  Small caliber nondominant RCA.  Normal LVEF by LV gram.  Normal EDP.  RECOMMENDATIONS  Return to Short Stay Post Procedure Unit for TR band removal.   Anticipate discharge home today after bedrest.  Consider adding calcium channel blocker for distal vessel spasm Glenetta Hew, MD   Assessment and Plan:   Robert Franklin is a 54 y.o. y/o male here to follow-up for abdominal pain.  Recent ER admission where he was found to have colitis on CT scan of the abdomen resolved after course of antibiotics which I felt was likely infectious.    Presently his main issues is bloating abdominal distention and floppy bowel movements.  It very likely this is related to irritable bowel syndrome with diarrhea.   Plan 1.  Commence on a low FODMAP diet 2.  Use activated charcoal tablets as needed as needed. 3.  Commence on Levsin taken before meals up to 4 times a day. 4.  Follow-up telephone visit in 2 weeks if no better will treat him with Xifaxan for IBS-D. 5.  Check stools for GI PCR and C. difficile along with fecal calprotectin   Dr Jonathon Bellows  MD,MRCP [facility]) Follow up in 2 weeks telephone visit

## 2019-02-27 ENCOUNTER — Ambulatory Visit: Payer: Commercial Managed Care - PPO | Admitting: Adult Health

## 2019-02-27 ENCOUNTER — Encounter: Payer: Self-pay | Admitting: Adult Health

## 2019-02-27 VITALS — BP 114/77 | HR 97 | Temp 98.1°F | Ht 69.0 in | Wt 233.2 lb

## 2019-02-27 DIAGNOSIS — E781 Pure hyperglyceridemia: Secondary | ICD-10-CM

## 2019-02-27 DIAGNOSIS — I2510 Atherosclerotic heart disease of native coronary artery without angina pectoris: Secondary | ICD-10-CM | POA: Diagnosis not present

## 2019-02-27 DIAGNOSIS — E119 Type 2 diabetes mellitus without complications: Secondary | ICD-10-CM | POA: Diagnosis not present

## 2019-02-27 DIAGNOSIS — I10 Essential (primary) hypertension: Secondary | ICD-10-CM

## 2019-02-27 NOTE — Patient Instructions (Signed)
Medication Instructions:  Continue current medications  *If you need a refill on your cardiac medications before your next appointment, please call your pharmacy*  Lab Work: None Ordered  Testing/Procedures: None Ordered  Follow-Up: At [facility], you and your health needs are our priority.  As part of our continuing mission to provide you with exceptional heart care, we have created designated Provider Care Teams.  These Care Teams include your primary Cardiologist (physician) and Advanced Practice Providers (APPs -  Physician Assistants and Nurse Practitioners) who all work together to provide you with the care you need, when you need it.  Your next appointment:   1 year(s)  The format for your next appointment:   In Person  Provider:   You may see Glenetta Hew, MD or one of the following Advanced Practice Providers on your designated Care Team:    Rosaria Ferries, PA-C  Jory Sims, DNP, ANP  Cadence Kathlen Mody, NP

## 2019-03-07 ENCOUNTER — Encounter: Payer: Self-pay | Admitting: Family Medicine

## 2019-03-07 ENCOUNTER — Other Ambulatory Visit: Payer: Self-pay

## 2019-03-07 ENCOUNTER — Ambulatory Visit: Payer: Commercial Managed Care - PPO | Admitting: Family Medicine

## 2019-03-07 VITALS — BP 112/73 | HR 72 | Temp 96.9°F | Resp 16 | Ht 69.0 in | Wt 233.0 lb

## 2019-03-07 DIAGNOSIS — E559 Vitamin D deficiency, unspecified: Secondary | ICD-10-CM

## 2019-03-07 DIAGNOSIS — E8881 Metabolic syndrome: Secondary | ICD-10-CM

## 2019-03-07 DIAGNOSIS — G4733 Obstructive sleep apnea (adult) (pediatric): Secondary | ICD-10-CM

## 2019-03-07 DIAGNOSIS — E669 Obesity, unspecified: Secondary | ICD-10-CM

## 2019-03-07 DIAGNOSIS — R519 Headache, unspecified: Secondary | ICD-10-CM

## 2019-03-07 DIAGNOSIS — R2689 Other abnormalities of gait and mobility: Secondary | ICD-10-CM | POA: Diagnosis not present

## 2019-03-07 DIAGNOSIS — R42 Dizziness and giddiness: Secondary | ICD-10-CM

## 2019-03-07 DIAGNOSIS — I2510 Atherosclerotic heart disease of native coronary artery without angina pectoris: Secondary | ICD-10-CM

## 2019-03-07 DIAGNOSIS — E119 Type 2 diabetes mellitus without complications: Secondary | ICD-10-CM | POA: Diagnosis not present

## 2019-03-07 DIAGNOSIS — I10 Essential (primary) hypertension: Secondary | ICD-10-CM

## 2019-03-07 LAB — POCT GLYCOSYLATED HEMOGLOBIN (HGB A1C)
Est. average glucose Bld gHb Est-mCnc: 189
Hemoglobin A1C: 8.2 % — AB (ref 4.0–5.6)

## 2019-03-07 MED ORDER — PIOGLITAZONE HCL 15 MG PO TABS: 15.0000 mg | ORAL_TABLET | Freq: Every day | ORAL | 11 refills | Status: DC

## 2019-03-07 NOTE — Progress Notes (Signed)
Patient: Robert Franklin Male    DOB: 05-28-1965   54 y.o.   MRN: [id number] Visit Date: 03/07/2019  Today's Provider: [name], MD   Chief Complaint  Patient presents with  . Follow-up  . Diabetes  . Gastroesophageal Reflux   Subjective:     HPI  Patient is blood sugars have slowly been going up.  He does not exercise. He has a vague complaint in recent weeks of headache dizziness and being off balance.  He has had no falls.  He states he just does not feel well.  Patient feels as though he is lost weight although he has gained 3 pounds since his last visit. Type 2 diabetes mellitus without complication, without long-term current use of insulin ([place]) From 11/07/2018-Good control with A1c of 6.9.  No changes.  Continue to work on habits.  Home blood sugars averaging above 200.  Gastritis, presence of bleeding unspecified, unspecified chronicity, unspecified gastritis type From 11/07/2018-Added omeprazole 20 mg daily. Referred to Gastroenterology.  Generalized abdominal pain From 11/07/2018-Uncertain etiology.  Benign exam today. Referred to Gastroenterology.  Hypertriglyceridemia without hypercholesterolemia From 11/07/2018-Stressed continuing to work on lifestyle.  Diarrhea of presumed infectious origin From 11/07/2018-advised to add probiotic daily.  Referred back to GI for aforementioned GI symptoms.  Patient states he has had increased headaches. Also having balance issues.    Allergies  Allergen Reactions  . Mucinex [Guaifenesin Er] Other (See Comments)    Body tingles all over     Current Outpatient Medications:  .  acetaminophen (TYLENOL) 500 MG tablet, Take 1,000 mg by mouth every 8 (eight) hours as needed for moderate pain or headache., Disp: , Rfl:  .  aspirin EC 81 MG tablet, Take 81 mg by mouth daily., Disp: , Rfl:  .  butalbital-acetaminophen-caffeine (FIORICET) 50-325-40 MG tablet, TAKE 1 TABLET BY MOUTH 2 (TWO) TIMES DAILY AS  NEEDED FOR HEADACHE., Disp: 30 tablet, Rfl: 4 .  chlorthalidone (HYGROTON) 50 MG tablet, Take 0.5 tablets (25 mg total) by mouth daily., Disp: 90 tablet, Rfl: 3 .  CINNAMON PO, Take 1,200 mg by mouth daily., Disp: , Rfl:  .  COLLAGEN PO, Take 6,000 mg by mouth daily., Disp: , Rfl:  .  fenofibrate 160 MG tablet, Take 1 tablet (160 mg total) by mouth daily., Disp: 90 tablet, Rfl: 3 .  Homeopathic Products (THERAWORX RELIEF) FOAM, Apply 1 application topically daily as needed (pain/cramps)., Disp: , Rfl:  .  hyoscyamine (LEVSIN) 0.125 MG tablet, TAKE 1 TABLET BY MOUTH 4 TIMES A DAY AS NEEDED FOR PAIN (Patient taking differently: Take 0.125 mg by mouth 4 (four) times daily as needed (pain). ), Disp: 360 tablet, Rfl: 0 .  losartan (COZAAR) 100 MG tablet, TAKE 1 TABLET BY MOUTH EVERY DAY. TAKE WITH HCTZ (Patient taking differently: Take 100 mg by mouth daily. ), Disp: 90 tablet, Rfl: 3 .  Magnesium 100 MG CAPS, Take 200 mg by mouth daily., Disp: , Rfl:  .  Menthol, Topical Analgesic, (BIOFREEZE EX), Apply 1 application topically daily as needed (pain)., Disp: , Rfl:  .  metFORMIN (GLUCOPHAGE) 1000 MG tablet, TAKE 1 TABLET (1,000 MG TOTAL) BY MOUTH 2 (TWO) TIMES DAILY WITH A MEAL., Disp: 180 tablet, Rfl: 1 .  metoprolol succinate (TOPROL-XL) 100 MG 24 hr tablet, Take 1 tablet (100 mg total) by mouth daily. Take with or immediately following a meal., Disp: 90 tablet, Rfl: 3 .  naproxen sodium (ALEVE) 220 MG tablet, Take  440 mg by mouth at bedtime as needed (pain)., Disp: , Rfl:  .  omeprazole (PRILOSEC OTC) 20 MG tablet, Take 1 tablet (20 mg total) by mouth daily., Disp: 30 tablet, Rfl: 12 .  ondansetron (ZOFRAN-ODT) 4 MG disintegrating tablet, Take 1 tablet (4 mg total) by mouth every 8 (eight) hours as needed for nausea or vomiting., Disp: 30 tablet, Rfl: 0 .  Saw Palmetto, Serenoa repens, (SAW PALMETTO PO), Take 500 mg by mouth daily., Disp: , Rfl:  .  Simethicone (GAS-X EXTRA STRENGTH) 125 MG CAPS, Take  125 mg by mouth daily as needed (gas)., Disp: , Rfl:  .  tadalafil (CIALIS) 20 MG tablet, TAKE 0.5 TABLETS (10 MG TOTAL) BY MOUTH DAILY AS NEEDED FOR ERECTILE DYSFUNCTION. (Patient taking differently: Take 20 mg by mouth daily as needed for erectile dysfunction. ), Disp: 10 tablet, Rfl: 5 .  VASCEPA 1 g capsule, TAKE 2 CAPSULES (2 G TOTAL) BY MOUTH 2 (TWO) TIMES DAILY. (Patient taking differently: Take 2 g by mouth 2 (two) times daily. ), Disp: 120 capsule, Rfl: 11 .  vitamin C (ASCORBIC ACID) 250 MG tablet, Take 1,000 mg by mouth daily., Disp: , Rfl:  .  bismuth subsalicylate (PEPTO BISMOL) 262 MG/15ML suspension, Take 30 mLs by mouth every 6 (six) hours as needed for indigestion., Disp: , Rfl:  .  dicyclomine (BENTYL) 20 MG tablet, Take 1 tablet (20 mg total) by mouth every 6 (six) hours as needed for up to 7 days for spasms., Disp: 28 tablet, Rfl: 0 .  metroNIDAZOLE (FLAGYL) 500 MG tablet, Take 1 tablet (500 mg total) by mouth 2 (two) times daily. (Patient not taking: Reported on 03/07/2019), Disp: 14 tablet, Rfl: 0 .  rosuvastatin (CRESTOR) 20 MG tablet, Take 1 tablet (20 mg total) by mouth daily., Disp: 90 tablet, Rfl: 3  Review of Systems  Constitutional: Negative for appetite change, chills and fever.  HENT: Negative.   Eyes: Negative.   Respiratory: Negative for chest tightness, shortness of breath and wheezing.   Cardiovascular: Negative for chest pain and palpitations.  Gastrointestinal: Positive for diarrhea. Negative for abdominal pain, nausea and vomiting.  Endocrine: Negative.   Allergic/Immunologic: Negative.   Neurological: Positive for dizziness and headaches.       Headache location is very nonspecific.  He says it moves around some.  Hematological: Negative.   Psychiatric/Behavioral: The patient is nervous/anxious.     Social History   Tobacco Use  . Smoking status: Former Smoker    Packs/day: 1.00    Years: 28.00    Pack years: 28.00    Types: Cigarettes    Quit  date: 12/24/1996    Years since quitting: 22.2  . Smokeless tobacco: Never Used  Substance Use Topics  . Alcohol use: Never    Comment: seldom      Objective:   BP 112/73 (BP Location: Left Arm, Patient Position: Sitting, Cuff Size: Large)   Pulse 72   Temp (!) 96.9 F (36.1 C) (Temporal)   Resp 16   Ht 5\' 9"  (1.753 m)   Wt 233 lb (105.7 kg)   SpO2 97%   BMI 34.41 kg/m  Vitals:   03/07/19 1601  BP: 112/73  Pulse: 72  Resp: 16  Temp: (!) 96.9 F (36.1 C)  TempSrc: Temporal  SpO2: 97%  Weight: 233 lb (105.7 kg)  Height: 5\' 9"  (1.753 m)  Body mass index is 34.41 kg/m.   Physical Exam Vitals reviewed.  Constitutional:  Appearance: He is well-developed.  HENT:     Head: Normocephalic and atraumatic.     Right Ear: External ear normal.     Left Ear: External ear normal.     Nose: Nose normal.  Eyes:     Conjunctiva/sclera: Conjunctivae normal.  Neck:     Thyroid: No thyromegaly.  Cardiovascular:     Rate and Rhythm: Normal rate and regular rhythm.     Heart sounds: Normal heart sounds.  Pulmonary:     Effort: Pulmonary effort is normal.     Breath sounds: Normal breath sounds.  Abdominal:     Palpations: Abdomen is soft.  Skin:    General: Skin is warm and dry.  Neurological:     General: No focal deficit present.     Mental Status: He is alert and oriented to person, place, and time.     Cranial Nerves: No cranial nerve deficit.     Motor: No weakness.     Coordination: Coordination normal.     Gait: Gait normal.     Deep Tendon Reflexes: Reflexes normal.     Comments: No temporal artery tenderness.  Neurologic exam grossly nonfocal and cerebellar function intact.  Gait is normal, Romberg normal, finger-to-nose normal.  Psychiatric:        Mood and Affect: Mood normal.        Behavior: Behavior normal.        Thought Content: Thought content normal.        Judgment: Judgment normal.      Results for orders placed or performed in visit on  03/07/19  POCT glycosylated hemoglobin (Hb A1C)  Result Value Ref Range   Hemoglobin A1C 8.2 (A) 4.0 - 5.6 %   Est. average glucose Bld gHb Est-mCnc 189        Assessment & Plan    1. Type 2 diabetes mellitus without complication, without long-term current use of insulin ([facility]) Start pioglitazone 15 mg daily.  - POCT glycosylated hemoglobin (Hb A1C) - pioglitazone (ACTOS) 15 MG tablet; Take 1 tablet (15 mg total) by mouth daily.  Dispense: 30 tablet; Refill: 11  2. Increased frequency of headaches I do not find anything on neurologic exam.  Because of patient's concerns will refer to neurology. Patient has a long history of tension type headaches which have been relieved through the years with butalbital or Tylenol.  We discussed rebound headaches. - Ambulatory referral to Neurology  3. Dizziness Normal exam today. - Ambulatory referral to Neurology  4. Balance problem  - Ambulatory referral to Neurology  5. Essential hypertension Controlled.  6. Arteriosclerosis of coronary artery She had recent cardiology evaluation including a left heart cath which revealed a proximal RCA 40% stenosis, diffuse aneurysmal/ectatic dominant circumflex artery.. EF is 55 to 65%  7. Obstructive sleep apnea syndrome   8. Metabolic syndrome   9. Obesity, Class I, BMI 30.0-34.9 (see actual BMI) Diet and exercise continue to be stressed.  10. Avitaminosis D    Follow up in 3 months.     I,Kennetta Pavlovic,acting as a scribe for [name], MD.,have documented all relevant documentation on the behalf of [name], MD,as directed by  [name], MD while in the presence of [name], MD.      [name], MD  [facility]

## 2019-03-11 ENCOUNTER — Other Ambulatory Visit: Payer: Self-pay

## 2019-03-28 ENCOUNTER — Ambulatory Visit (INDEPENDENT_AMBULATORY_CARE_PROVIDER_SITE_OTHER): Payer: Commercial Managed Care - PPO | Admitting: Gastroenterology

## 2019-03-28 ENCOUNTER — Telehealth: Payer: Self-pay | Admitting: Internal Medicine

## 2019-03-28 DIAGNOSIS — K580 Irritable bowel syndrome with diarrhea: Secondary | ICD-10-CM | POA: Diagnosis not present

## 2019-03-28 NOTE — Progress Notes (Signed)
Robert Franklin , MD [address]  [place], [place] [address]  Main: [phone]  Fax: [phone]   Primary Care Physician: [name]., MD  Virtual Visit via Telephone Note  I connected with patient on 03/28/19 at  1:15 PM EST by telephone and verified that I am speaking with the correct person using two identifiers.   I discussed the limitations, risks, security and privacy concerns of performing an evaluation and management service by telephone and the availability of in person appointments. I also discussed with the patient that there may be a patient responsible charge related to this service. The patient expressed understanding and agreed to proceed.  Location of Patient: Home Location of Provider: Home Persons involved: Patient and provider only   History of Present Illness:   Follow up for IBS-D  HPI: Robert Franklin is a 54 y.o. male   Summary of history : Was initially referred and seen on 01/02/2019 after he presented to the emergency room in 10/14/2018 with nausea vomiting diarrhea and abdominal pain.  Prior history of appendectomy and cholecystectomy.  At the time of his appendectomy he had had extensive surgery as his appendix had ruptured.  In the ER underwent a CT scan of the abdomen.which showed mild long segment wall thickening involving the rectum and sigmoid colon suggesting a nonspecific colitis. Few "scattered colonic diverticula without inflammation to suggest acute diverticulitis.At that time he had a fever. Given ciprofloxacin and Flagyl and discharged home. He returned back to the ER 4 days later on 10/17/2018. Had up to 12 episodes of loose stools. Test for C. difficile was negative. Test for GI PCR was also negative. Hemoglobin 15.2 g..  Since 10/14/2018 he has been having on and off lower abdominal pain cramping in nature occurring almost every day lasting for a few seconds. Nonradiating. Worse while eating. Not  really relieved by defecation.   Had been taking Advil once a week, abdominal pain associated with gas and bloating. He did feel that on days when he has more bloating the pain is more intense. He had noticed black tarry stools on occasions. Takes Prilosec but at night just before bedtime.   Was consuming a lot of artificial sweeteners at his initial visit  I had performed his last colonoscopy over 2 years back for screening and noted an ileocolonic anastomosis and 2 benign polyps resected.  01/02/2019: H. pylori breath test negative, CBC normal, CMP normal except an elevated glucose of 214.  Lipase normal.  01/11/2019: EGD: Features of gastritis seen on endoscopy biopsies of which were taken and showed no active inflammation.  Biopsies of the duodenum were negative for celiac disease.  At the same time  Flexible sigmoidoscopy performed the mucosa of the colon appeared completely normal biopsies of which were also taken and showed no abnormalities   Interval history  02/26/2019-03/28/2019  Did not obtain stool studies that were ordered after last visit  Diarrhea bit better but persists, not on advil taking Levsin and charcoal tablets.    Current Outpatient Medications  Medication Sig Dispense Refill  . acetaminophen (TYLENOL) 500 MG tablet Take 1,000 mg by mouth every 8 (eight) hours as needed for moderate pain or headache.    Marland Kitchen aspirin EC 81 MG tablet Take 81 mg by mouth daily.    Marland Kitchen bismuth subsalicylate (PEPTO BISMOL) 262 MG/15ML suspension Take 30 mLs by mouth every 6 (six) hours as needed for indigestion.    . butalbital-acetaminophen-caffeine (FIORICET)  50-325-40 MG tablet TAKE 1 TABLET BY MOUTH 2 (TWO) TIMES DAILY AS NEEDED FOR HEADACHE. 30 tablet 4  . chlorthalidone (HYGROTON) 50 MG tablet Take 0.5 tablets (25 mg total) by mouth daily. 90 tablet 3  . CINNAMON PO Take 1,200 mg by mouth daily.    . COLLAGEN PO Take 6,000 mg by mouth daily.    Marland Kitchen dicyclomine (BENTYL) 20 MG tablet  Take 1 tablet (20 mg total) by mouth every 6 (six) hours as needed for up to 7 days for spasms. 28 tablet 0  . fenofibrate 160 MG tablet Take 1 tablet (160 mg total) by mouth daily. 90 tablet 3  . Homeopathic Products (THERAWORX RELIEF) FOAM Apply 1 application topically daily as needed (pain/cramps).    . hyoscyamine (LEVSIN) 0.125 MG tablet TAKE 1 TABLET BY MOUTH 4 TIMES A DAY AS NEEDED FOR PAIN (Patient taking differently: Take 0.125 mg by mouth 4 (four) times daily as needed (pain). ) 360 tablet 0  . losartan (COZAAR) 100 MG tablet TAKE 1 TABLET BY MOUTH EVERY DAY. TAKE WITH HCTZ (Patient taking differently: Take 100 mg by mouth daily. ) 90 tablet 3  . Magnesium 100 MG CAPS Take 200 mg by mouth daily.    . Menthol, Topical Analgesic, (BIOFREEZE EX) Apply 1 application topically daily as needed (pain).    . metFORMIN (GLUCOPHAGE) 1000 MG tablet TAKE 1 TABLET (1,000 MG TOTAL) BY MOUTH 2 (TWO) TIMES DAILY WITH A MEAL. 180 tablet 1  . metoprolol succinate (TOPROL-XL) 100 MG 24 hr tablet Take 1 tablet (100 mg total) by mouth daily. Take with or immediately following a meal. 90 tablet 3  . metroNIDAZOLE (FLAGYL) 500 MG tablet Take 1 tablet (500 mg total) by mouth 2 (two) times daily. (Patient not taking: Reported on 03/07/2019) 14 tablet 0  . naproxen sodium (ALEVE) 220 MG tablet Take 440 mg by mouth at bedtime as needed (pain).    Marland Kitchen omeprazole (PRILOSEC OTC) 20 MG tablet Take 1 tablet (20 mg total) by mouth daily. 30 tablet 12  . ondansetron (ZOFRAN-ODT) 4 MG disintegrating tablet Take 1 tablet (4 mg total) by mouth every 8 (eight) hours as needed for nausea or vomiting. 30 tablet 0  . pioglitazone (ACTOS) 15 MG tablet Take 1 tablet (15 mg total) by mouth daily. 30 tablet 11  . rosuvastatin (CRESTOR) 20 MG tablet Take 1 tablet (20 mg total) by mouth daily. 90 tablet 3  . Saw Palmetto, Serenoa repens, (SAW PALMETTO PO) Take 500 mg by mouth daily.    . Simethicone (GAS-X EXTRA STRENGTH) 125 MG CAPS Take  125 mg by mouth daily as needed (gas).    . tadalafil (CIALIS) 20 MG tablet TAKE 0.5 TABLETS (10 MG TOTAL) BY MOUTH DAILY AS NEEDED FOR ERECTILE DYSFUNCTION. (Patient taking differently: Take 20 mg by mouth daily as needed for erectile dysfunction. ) 10 tablet 5  . VASCEPA 1 g capsule TAKE 2 CAPSULES (2 G TOTAL) BY MOUTH 2 (TWO) TIMES DAILY. (Patient taking differently: Take 2 g by mouth 2 (two) times daily. ) 120 capsule 11  . vitamin C (ASCORBIC ACID) 250 MG tablet Take 1,000 mg by mouth daily.     No current facility-administered medications for this visit.    Allergies as of 03/28/2019 - Review Complete 03/07/2019  Allergen Reaction Noted  . Mucinex [guaifenesin er] Other (See Comments) 05/29/2014    Review of Systems:    All systems reviewed and negative except where noted in HPI.  Observations/Objective:  Labs: CMP     Component Value Date/Time   NA 140 02/07/2019 1448   NA 135 (L) 01/29/2013 0111   K 4.5 02/07/2019 1448   K 4.1 01/29/2013 0111   CL 97 02/07/2019 1448   CL 103 01/29/2013 0111   CO2 29 02/07/2019 1448   CO2 28 01/29/2013 0111   GLUCOSE 178 (H) 02/07/2019 1448   GLUCOSE 182 (H) 10/17/2018 1720   GLUCOSE 135 (H) 01/29/2013 0111   BUN 21 02/07/2019 1448   BUN 25 (H) 01/29/2013 0111   CREATININE 1.04 02/07/2019 1448   CREATININE 1.01 01/29/2013 0111   CALCIUM 10.2 02/07/2019 1448   CALCIUM 9.2 01/29/2013 0111   PROT 7.4 01/02/2019 1113   ALBUMIN 4.9 01/02/2019 1113   AST 35 01/02/2019 1113   ALT 35 01/02/2019 1113   ALKPHOS 102 01/02/2019 1113   BILITOT 0.6 01/02/2019 1113   GFRNONAA 82 02/07/2019 1448   GFRNONAA >60 01/29/2013 0111   GFRAA 95 02/07/2019 1448   GFRAA >60 01/29/2013 0111   Lab Results  Component Value Date   WBC 9.5 02/07/2019   HGB 15.5 02/07/2019   HCT 42.6 02/07/2019   MCV 84 02/07/2019   PLT 213 02/07/2019    Imaging Studies: No results found.  Assessment and Plan:   Robert Franklin is a 54 y.o. y/o male  here  to follow-up for abdominal pain, diarrhea, a bit betetr since last visit     Plan 1.  Continue on a low FODMAP diet, charcoal tablets, levsin  2.  Check stools for GI PCR and C. difficile along with fecal calprotectinUse activated charcoal tablets as needed as needed. 3.    Follow-up telephone visit in 2 weeks if no better will treat him with Xifaxan for IBS-D.     I discussed the assessment and treatment plan with the patient. The patient was provided an opportunity to ask questions and all were answered. The patient agreed with the plan and demonstrated an understanding of the instructions.   The patient was advised to call back or seek an in-person evaluation if the symptoms worsen or if the condition fails to improve as anticipated.  I provided 11 minutes of non-face-to-face time during this encounter.  Dr Robert Bellows MD,MRCP [facility]) Gastroenterology/Hepatology Pager: [phone]   Speech recognition software was used to dictate this note.

## 2019-03-28 NOTE — Telephone Encounter (Signed)
Called patient to schedule follow up visit with Dr. Debara Pickett, he stated that his insurance will not cover his lab work or his medication that Dr. Debara Pickett has ordered for him.  So at this time he does not want to schedule any follow up appts.

## 2019-04-11 ENCOUNTER — Ambulatory Visit: Payer: Commercial Managed Care - PPO | Admitting: Neurology

## 2019-04-13 ENCOUNTER — Other Ambulatory Visit: Payer: Self-pay | Admitting: Family Medicine

## 2019-04-23 ENCOUNTER — Other Ambulatory Visit: Payer: Self-pay

## 2019-04-23 ENCOUNTER — Telehealth: Payer: Self-pay | Admitting: Gastroenterology

## 2019-04-23 MED ORDER — CHOLESTYRAMINE 4 G PO PACK: 4.0000 g | PACK | Freq: Two times a day (BID) | ORAL | 2 refills | Status: DC

## 2019-04-23 MED ORDER — RIFAXIMIN 550 MG PO TABS: 550.0000 mg | ORAL_TABLET | Freq: Two times a day (BID) | ORAL | 0 refills | Status: DC

## 2019-04-23 NOTE — Telephone Encounter (Signed)
Since stool tests are negative I would suggest that he commence on Xifaxan for IBS-D for 2 weeks.  If he is unable to urinate for the past 2 days he should probably be seen at urgent care or at his primary care physician's office to scan his bladder and make sure he is not dehydrated or has any urinary retention.  Usually irritable bowel syndrome with diarrhea should not cause dehydration.  Suggest to also add Questran 1 sachet once or twice a day for the diarrhea.  Dr. Vicente Males

## 2019-04-23 NOTE — Telephone Encounter (Signed)
Spoke with pt and informed him of Dr. Georgeann Oppenheim recommendations. Pt understands and agrees. Prescriptions for both Xifaxan and Questran have been sent to pt's preferred pharmacy.

## 2019-04-23 NOTE — Telephone Encounter (Signed)
Pt called to request Dr. Georgeann Oppenheim advice. Pt states he's still experiencing diarrhea, abdominal cramping and nausea. Pt also states he hasn't been able to urinate for the past 2 days, anything he eats or drinks causes watery diarrhea. Pt states he's beginning to feel weak as he's not able to stay hydrated or eat. Pt states he dropped off a stool specimen at the lab yesterday. Pt states none of the GI medications have helped.

## 2019-04-25 ENCOUNTER — Encounter: Payer: Self-pay | Admitting: Gastroenterology

## 2019-04-26 LAB — C DIFFICILE TOXINS A+B W/RFLX: C difficile Toxins A+B, EIA: NEGATIVE

## 2019-04-26 LAB — GI PROFILE, STOOL, PCR

## 2019-04-26 LAB — C DIFFICILE, CYTOTOXIN B

## 2019-04-26 LAB — CALPROTECTIN, FECAL: Calprotectin, Fecal: 692 ug/g — ABNORMAL HIGH (ref 0–120)

## 2019-04-30 ENCOUNTER — Other Ambulatory Visit: Payer: Self-pay

## 2019-04-30 ENCOUNTER — Ambulatory Visit: Payer: Commercial Managed Care - PPO | Admitting: Neurology

## 2019-04-30 ENCOUNTER — Encounter: Payer: Self-pay | Admitting: Neurology

## 2019-04-30 VITALS — BP 121/83 | HR 73 | Temp 98.3°F | Ht 69.0 in | Wt 231.4 lb

## 2019-04-30 DIAGNOSIS — M542 Cervicalgia: Secondary | ICD-10-CM | POA: Diagnosis not present

## 2019-04-30 DIAGNOSIS — G43009 Migraine without aura, not intractable, without status migrainosus: Secondary | ICD-10-CM

## 2019-04-30 DIAGNOSIS — R42 Dizziness and giddiness: Secondary | ICD-10-CM

## 2019-04-30 DIAGNOSIS — M5481 Occipital neuralgia: Secondary | ICD-10-CM

## 2019-04-30 DIAGNOSIS — E1142 Type 2 diabetes mellitus with diabetic polyneuropathy: Secondary | ICD-10-CM

## 2019-04-30 DIAGNOSIS — G4733 Obstructive sleep apnea (adult) (pediatric): Secondary | ICD-10-CM

## 2019-04-30 DIAGNOSIS — R519 Headache, unspecified: Secondary | ICD-10-CM | POA: Diagnosis not present

## 2019-04-30 MED ORDER — IMIPRAMINE HCL 25 MG PO TABS: 25.0000 mg | ORAL_TABLET | Freq: Every day | ORAL | 5 refills | Status: DC

## 2019-04-30 NOTE — Progress Notes (Signed)
[facility]  PATIENT: Jamiah Raimer DOB: 07-Sep-1965  REFERRING DOCTOR OR PCP: Rudean Hitt, MD SOURCE: Patient, notes from primary care, imaging reports and images  _________________________________   HISTORICAL  CHIEF COMPLAINT:  Chief Complaint  Patient presents with  . Follow-up    RM 12, alone. Internal referral for headaches, dizziness, balance issues. This all started about 6 months ago.     HISTORY OF PRESENT ILLNESS:  I had the pleasure of seeing your patient, Roby Broomhead, at [facility] for neurologic consultation regarding his headaches.  He is a 54 year old man who has a history of intermittent migraine headaches occurring 2 times a week since childhood.   He has a strong FH of migraines in last 4 generations.    When a HA is more severe he gets nausea, vomiting, photophobia > phonophobia.  Moving his head during a migraine makes him dizzy.   He tries to take an Excedrin migraine and other times takes a Fioricet.   Fioricet makes him feel like he is functioning worse.  He used totake 30 tablets over a year but now is taking them more regularly.   He has never been on a migraine preventative but is on metoprolol for his heart.    He is also having having vertigo.  If this occurs while walking he veers to one side or other.   He has a rotatory vertigo when he bends over at times  He notes neck pain that radiates to the occiput and higher.   That pain is mostly mild in intensity but occurs 24/7 with fluctuations in intensity.   He takes Tylenol or Aleve prn but has never been on a daily preventative for this pain.      He has a history of chronic ear infections and regularly sees ENT.   He needs to try to avoid water in his ears.   He had a cardiac stress test that was abnormal so he had a diagnostic cardiac catheterization study.      He has Type 2 DM, essential hypertension and hyperlipidemia.   He has severe OSA and is on  CPAP.   He uses it some but not all nights.  There is no brain imaging to review.  I did review the cervical spine x-ray from 05/04/2011.  Disc space was normal.  No spondylosis was noted.  REVIEW OF SYSTEMS: Constitutional: No fevers, chills, sweats, or change in appetite Eyes: No visual changes, double vision, eye pain Ear, nose and throat: No hearing loss, ear pain, nasal congestion, sore throat Cardiovascular: No chest pain, palpitations.  In the past he had angina but he reports the catheterization did not show any significant Respiratory: No shortness of breath at rest or with exertion.   He has OSA and uses CPAP.   GastrointestinaI: He has irritable bowel  genitourinary: No dysuria, urinary retention or frequency.  No nocturia. Has ED Musculoskeletal: No neck pain, back pain Integumentary: No rash, pruritus, skin lesions Neurological: as above Psychiatric: No depression at this time.  No anxiety Endocrine: No palpitations, diaphoresis, change in appetite, change in weigh or increased thirst Hematologic/Lymphatic: No anemia, purpura, petechiae. Allergic/Immunologic: No itchy/runny eyes, nasal congestion, recent allergic reactions, rashes  ALLERGIES: Allergies  Allergen Reactions  . Mucinex [Guaifenesin Er] Other (See Comments)    Body tingles all over    HOME MEDICATIONS:  Current Outpatient Medications:  .  acetaminophen (TYLENOL) 500 MG tablet, Take 1,000 mg by mouth every  8 (eight) hours as needed for moderate pain or headache., Disp: , Rfl:  .  aspirin EC 81 MG tablet, Take 81 mg by mouth daily., Disp: , Rfl:  .  bismuth subsalicylate (PEPTO BISMOL) 262 MG/15ML suspension, Take 30 mLs by mouth every 6 (six) hours as needed for indigestion., Disp: , Rfl:  .  butalbital-acetaminophen-caffeine (FIORICET) 50-325-40 MG tablet, TAKE 1 TABLET BY MOUTH 2 (TWO) TIMES DAILY AS NEEDED FOR HEADACHE., Disp: 30 tablet, Rfl: 4 .  chlorthalidone (HYGROTON) 50 MG tablet, Take 0.5  tablets (25 mg total) by mouth daily., Disp: 90 tablet, Rfl: 3 .  cholestyramine (QUESTRAN) 4 g packet, Take 1 packet (4 g total) by mouth 2 (two) times daily., Disp: 60 packet, Rfl: 2 .  CINNAMON PO, Take 1,200 mg by mouth daily., Disp: , Rfl:  .  COLLAGEN PO, Take 6,000 mg by mouth daily., Disp: , Rfl:  .  fenofibrate 160 MG tablet, Take 1 tablet (160 mg total) by mouth daily., Disp: 90 tablet, Rfl: 3 .  Homeopathic Products (THERAWORX RELIEF) FOAM, Apply 1 application topically daily as needed (pain/cramps)., Disp: , Rfl:  .  hyoscyamine (LEVSIN) 0.125 MG tablet, TAKE 1 TABLET BY MOUTH 4 TIMES A DAY AS NEEDED FOR PAIN (Patient taking differently: Take 0.125 mg by mouth 4 (four) times daily as needed (pain). ), Disp: 360 tablet, Rfl: 0 .  losartan (COZAAR) 100 MG tablet, TAKE 1 TABLET BY MOUTH EVERY DAY. TAKE WITH HCTZ (Patient taking differently: Take 100 mg by mouth daily. ), Disp: 90 tablet, Rfl: 3 .  Magnesium 100 MG CAPS, Take 200 mg by mouth daily., Disp: , Rfl:  .  Menthol, Topical Analgesic, (BIOFREEZE EX), Apply 1 application topically daily as needed (pain)., Disp: , Rfl:  .  metFORMIN (GLUCOPHAGE) 1000 MG tablet, TAKE 1 TABLET (1,000 MG TOTAL) BY MOUTH 2 (TWO) TIMES DAILY WITH A MEAL., Disp: 180 tablet, Rfl: 1 .  metoprolol succinate (TOPROL-XL) 100 MG 24 hr tablet, Take 1 tablet (100 mg total) by mouth daily. Take with or immediately following a meal., Disp: 90 tablet, Rfl: 3 .  metroNIDAZOLE (FLAGYL) 500 MG tablet, Take 1 tablet (500 mg total) by mouth 2 (two) times daily., Disp: 14 tablet, Rfl: 0 .  naproxen sodium (ALEVE) 220 MG tablet, Take 440 mg by mouth at bedtime as needed (pain)., Disp: , Rfl:  .  omeprazole (PRILOSEC OTC) 20 MG tablet, Take 1 tablet (20 mg total) by mouth daily., Disp: 30 tablet, Rfl: 12 .  ondansetron (ZOFRAN-ODT) 4 MG disintegrating tablet, Take 1 tablet (4 mg total) by mouth every 8 (eight) hours as needed for nausea or vomiting., Disp: 30 tablet, Rfl: 0 .   pioglitazone (ACTOS) 15 MG tablet, Take 1 tablet (15 mg total) by mouth daily., Disp: 30 tablet, Rfl: 11 .  rifaximin (XIFAXAN) 550 MG TABS tablet, Take 1 tablet (550 mg total) by mouth 2 (two) times daily for 14 days., Disp: 28 tablet, Rfl: 0 .  Saw Palmetto, Serenoa repens, (SAW PALMETTO PO), Take 500 mg by mouth daily., Disp: , Rfl:  .  Simethicone (GAS-X EXTRA STRENGTH) 125 MG CAPS, Take 125 mg by mouth daily as needed (gas)., Disp: , Rfl:  .  tadalafil (CIALIS) 20 MG tablet, TAKE 0.5 TABLETS (10 MG TOTAL) BY MOUTH DAILY AS NEEDED FOR ERECTILE DYSFUNCTION. (Patient taking differently: Take 20 mg by mouth daily as needed for erectile dysfunction. ), Disp: 10 tablet, Rfl: 5 .  VASCEPA 1 g capsule, TAKE 2  CAPSULES (2 G TOTAL) BY MOUTH 2 (TWO) TIMES DAILY. (Patient taking differently: Take 2 g by mouth 2 (two) times daily. ), Disp: 120 capsule, Rfl: 11 .  vitamin C (ASCORBIC ACID) 250 MG tablet, Take 1,000 mg by mouth daily., Disp: , Rfl:  .  dicyclomine (BENTYL) 20 MG tablet, Take 1 tablet (20 mg total) by mouth every 6 (six) hours as needed for up to 7 days for spasms., Disp: 28 tablet, Rfl: 0 .  rosuvastatin (CRESTOR) 20 MG tablet, Take 1 tablet (20 mg total) by mouth daily., Disp: 90 tablet, Rfl: 3  PAST MEDICAL HISTORY: Past Medical History:  Diagnosis Date  . Coronary artery disease, non-occlusive 02/28/2008   Dr. Humphrey Rolls @ [facility]: 2 x 40-50% lesions in dominant LCx lesion by cath,  EF 60% with normal wall motion.  . Diabetes mellitus without complication ([place])   . Essential hypertension   . GERD (gastroesophageal reflux disease)   . Headache   . High triglycerides     was previously on treatment, but  he notes that this was stopped  . Kidney stones   . Moderate obesity 05/29/2014  . OSA treated with BiPAP   . Short bowel syndrome 1994    PAST SURGICAL HISTORY: Past Surgical History:  Procedure Laterality Date  . "stomach wrap"  1991   GERD  . APPENDECTOMY    . CARDIAC CATHETERIZATION   02/2008   Dr. Humphrey Rolls @ [facility]: 2 x 40-50% lesions in a dominant LCx by cath,  EF 60% with normal wall motion.  [name] Nuclear Stress Test   January 2010    heart rate increased from 80-158 BPM. No EKG changes.  there is a moderate sized  reversible defect in the inferior and inferior lateral wall as well as anteroseptal. EF 59%.  . CardioNet Holter Monitor   January 2010    sinus rhythm. Rare PVCs in setting was. Current PACs , seen in singles and pairs. Max heart rate 127, minimum heart rate 57. Average 84.  . CHOLECYSTECTOMY    . COLON SURGERY     due to gangrenous appendix  . COLONOSCOPY WITH PROPOFOL N/A 04/26/2016   Procedure: COLONOSCOPY WITH PROPOFOL;  Surgeon: Jonathon Bellows, MD;  Location: [facility] ENDOSCOPY;  Service: Endoscopy;  Laterality: N/A;  . ESOPHAGOGASTRODUODENOSCOPY (EGD) WITH PROPOFOL N/A 01/11/2019   Procedure: ESOPHAGOGASTRODUODENOSCOPY (EGD) WITH PROPOFOL;  Surgeon: Jonathon Bellows, MD;  Location: [facility] ENDOSCOPY;  Service: Gastroenterology;  Laterality: N/A;  . FLEXIBLE SIGMOIDOSCOPY N/A 01/11/2019   Procedure: FLEXIBLE SIGMOIDOSCOPY;  Surgeon: Jonathon Bellows, MD;  Location: [facility] ENDOSCOPY;  Service: Gastroenterology;  Laterality: N/A;  . HERNIA REPAIR  2010  . INNER EAR SURGERY    . LEFT HEART CATH AND CORONARY ANGIOGRAPHY N/A 02/12/2019   Procedure: LEFT HEART CATH AND CORONARY ANGIOGRAPHY;  Surgeon: Leonie Man, MD;  Location: [place] CV LAB;  Service: Cardiovascular;  Laterality: N/A;  . NM MYOVIEW LTD  01/10/2019   Treadmill Myoview: Walk 9:40 min-10.1 METS) EF ~45 to 50%.  INTERMEDIATE RISK STUDY-moderate sized mostly fixed perfusion defect in anterior apical segment.  No reversibility.  Suggests prior infarct without ischemia.  . TONSILLECTOMY    . TYMPANOSTOMY TUBE PLACEMENT Bilateral 1973    FAMILY HISTORY: Family History  Problem Relation Age of Onset  . Hypertension Mother   . Irritable bowel syndrome Mother   . Migraines Mother   . Lumbar disc disease  Mother   . Heart attack Mother   . Heart attack Maternal Uncle   .  Heart attack Maternal Grandfather     SOCIAL HISTORY:  Social History   Socioeconomic History  . Marital status: Married    Spouse name: Collie Siad  . Number of children: 0  . Years of education: 78  . Highest education level: Not on file  Occupational History  . Occupation: [employer]ist  Tobacco Use  . Smoking status: Former Smoker    Packs/day: 1.00    Years: 28.00    Pack years: 28.00    Types: Cigarettes    Quit date: 12/24/1996    Years since quitting: 22.3  . Smokeless tobacco: Never Used  Substance and Sexual Activity  . Alcohol use: Never    Comment: seldom  . Drug use: No  . Sexual activity: Not Currently  Other Topics Concern  . 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   Caffeine use:    Lives   Social Determinants of Health   Financial Resource Strain:   . Difficulty of Paying Living Expenses:   Food Insecurity:   . Worried About Charity fundraiser in the Last Year:   . Arboriculturist in the Last Year:   Transportation Needs:   . Film/video editor (Medical):   Marland Kitchen Lack of Transportation (Non-Medical):   Physical Activity:   . Days of Exercise per Week:   . Minutes of Exercise per Session:   Stress:   . Feeling of Stress :   Social Connections:   . Frequency of Communication with Friends and Family:   . Frequency of Social Gatherings with Friends and Family:   . Attends Religious Services:   . Active Member of Clubs or Organizations:   . Attends Archivist Meetings:   Marland Kitchen Marital Status:   Intimate Partner Violence:   . Fear of Current or Ex-Partner:   . Emotionally Abused:   Marland Kitchen Physically Abused:   . Sexually Abused:      PHYSICAL EXAM  Vitals:   04/30/19 1045  BP: 121/83  Pulse: 73  Temp: 98.3 F (36.8 C)  Weight: 231 lb 6.4 oz (105 kg)    Height: 5\' 9"  (1.753 m)    Body mass index is 34.17 kg/m.   General: The patient is well-developed and well-nourished and in no acute distress  HEENT:  Head is [place]/AT.  Sclera are anicteric.  Funduscopic exam shows normal optic discs and retinal vessels.  Neck: No carotid bruits are noted.  The neck is tender over the splenius capitis/occipital nerves and upper to mid cervical paraspinal muscles.  Range of motion was slightly reduced..  Cardiovascular: The heart has a regular rate and rhythm with a normal S1 and S2. There were no murmurs, gallops or rubs.    Skin: Extremities are without rash or  edema.  Musculoskeletal:  Back is nontender  Neurologic Exam  Mental status: The patient is alert and oriented x 3 at the time of the examination. The patient has apparent normal recent and remote memory, with an apparently normal attention span and concentration ability.   Speech is normal.  Cranial nerves: Extraocular movements are full. Pupils are equal, round, and reactive to light and accomodation.  Visual fields are full.  Facial symmetry is present. There is good facial sensation to soft touch bilaterally.Facial strength is normal.  Trapezius and sternocleidomastoid strength is normal. No dysarthria  is noted.  The tongue is midline, and the patient has symmetric elevation of the soft palate. No obvious hearing deficits are noted.  Motor:  Muscle bulk is normal.   Tone is normal. Strength is  5 / 5 in all 4 extremities.   Sensory: Sensory testing is intact to pinprick, soft touch and vibration sensation in the arms but reduced vibration sensation in the toes.  Normal vibration sensation at the ankles..  Coordination: Cerebellar testing reveals good finger-nose-finger and heel-to-shin bilaterally.  Gait and station: Station is normal.   Gait is normal. Tandem gait is normal. Romberg is negative.   Reflexes: Deep tendon reflexes are symmetric and normal bilaterally.   Plantar responses  are flexor.    DIAGNOSTIC DATA (LABS, IMAGING, TESTING) - I reviewed patient records, labs, notes, testing and imaging myself where available.  Lab Results  Component Value Date   WBC 9.5 02/07/2019   HGB 15.5 02/07/2019   HCT 42.6 02/07/2019   MCV 84 02/07/2019   PLT 213 02/07/2019      Component Value Date/Time   NA 140 02/07/2019 1448   NA 135 (L) 01/29/2013 0111   K 4.5 02/07/2019 1448   K 4.1 01/29/2013 0111   CL 97 02/07/2019 1448   CL 103 01/29/2013 0111   CO2 29 02/07/2019 1448   CO2 28 01/29/2013 0111   GLUCOSE 178 (H) 02/07/2019 1448   GLUCOSE 182 (H) 10/17/2018 1720   GLUCOSE 135 (H) 01/29/2013 0111   BUN 21 02/07/2019 1448   BUN 25 (H) 01/29/2013 0111   CREATININE 1.04 02/07/2019 1448   CREATININE 1.01 01/29/2013 0111   CALCIUM 10.2 02/07/2019 1448   CALCIUM 9.2 01/29/2013 0111   PROT 7.4 01/02/2019 1113   ALBUMIN 4.9 01/02/2019 1113   AST 35 01/02/2019 1113   ALT 35 01/02/2019 1113   ALKPHOS 102 01/02/2019 1113   BILITOT 0.6 01/02/2019 1113   GFRNONAA 82 02/07/2019 1448   GFRNONAA >60 01/29/2013 0111   GFRAA 95 02/07/2019 1448   GFRAA >60 01/29/2013 0111   Lab Results  Component Value Date   CHOL 155 01/02/2019   HDL 31 (L) 01/02/2019   LDLCALC 58 01/02/2019   LDLDIRECT 46 07/05/2018   TRIG 431 (H) 01/02/2019   CHOLHDL 5.0 01/02/2019   Lab Results  Component Value Date   HGBA1C 8.2 (A) 03/07/2019   No results found for: VITAMINB12 Lab Results  Component Value Date   TSH 1.740 08/07/2018       ASSESSMENT AND PLAN  Worsening headaches - Plan: MR BRAIN WO CONTRAST  Neck pain  Bilateral occipital neuralgia  Migraine without aura and without status migrainosus, not intractable  Vertigo - Plan: MR BRAIN WO CONTRAST  Type 2 diabetes mellitus with diabetic polyneuropathy, without long-term current use of insulin (HCC)  Obstructive sleep apnea syndrome    In summary, Mr. Degenova is a 54 year old man with frequent migraine  headaches superimposed on a lower intensity daily headache predominantly in the occiput.  On examination, he is tender over the splenius capitis muscles and the greater occipital nerves.  This is likely the source of his headache and also concerned as a trigger for the migraine headaches.  Bilateral splenius capitis muscles were injected with 60 mg Depo-Medrol in 3 cc Marcaine using sterile technique.  He tolerated the injections well and pain was much better after a few minutes.  We discussed that many times these injections can lead to long-term benefit but sometimes pain will return over  a few hours to days.  I will also have him start imipramine 25 mg nightly.  Hopefully this will help the headaches as well as the quality of his sleep.  It may also help the nocturia.  I also advised him to try to cut down on the Fioricet pills to just a couple a week or less.  Because of the worsening headaches combined with the vertigo, we need to check an MRI of the brain to determine if there is any other source of the pain and to further evaluate the internal auditory canals.  He will return to see [place] in several months or sooner if there are new or worsening neurologic symptoms.  Depending on the response to therapy, consider a change in prophylactic treatment to topiramate or to one of the anti-CGRP agents.  We also discussed that obstructive sleep apnea can worsen headaches and he is advised to use the CPAP nightly  Thank you for asking me to see Mr. Jarmin.  Please let her know I need further assistance with him or other patients in the future.  Aseel Truxillo A. Felecia Shelling, MD, [place] [id number], [id number] AM Certified in Neurology, Clinical Neurophysiology, Sleep Medicine and Neuroimaging  [facility] [address], [place],  [address] [phone]

## 2019-05-01 ENCOUNTER — Ambulatory Visit (INDEPENDENT_AMBULATORY_CARE_PROVIDER_SITE_OTHER): Payer: Commercial Managed Care - PPO | Admitting: Gastroenterology

## 2019-05-01 DIAGNOSIS — K580 Irritable bowel syndrome with diarrhea: Secondary | ICD-10-CM

## 2019-05-01 NOTE — Progress Notes (Signed)
Robert Franklin , MD [address]  [place], [place] [address]  Main: [phone]  Fax: [phone]   Primary Care Physician: [name]., MD  Virtual Visit via Telephone Note  I connected with patient on 05/01/19 at 10:00 AM EDT by telephone and verified that I am speaking with the correct person using two identifiers.   I discussed the limitations, risks, security and privacy concerns of performing an evaluation and management service by telephone and the availability of in person appointments. I also discussed with the patient that there may be a patient responsible charge related to this service. The patient expressed understanding and agreed to proceed.  Location of Patient: Home Location of Provider: Home Persons involved: Patient and provider only   History of Present Illness:   Follow-up for IBS-D  HPI: Robert Franklin is a 54 y.o. male    Summary of history : Was initially referred and seen on 01/02/2019 after he presented to the emergency room in 10/14/2018 with nausea vomiting diarrhea and abdominal pain. Prior history of appendectomy and cholecystectomy. At the time of his appendectomy he had had extensive surgery as his appendix had ruptured. In the ER underwent a CT scan of the abdomen.which showed mild long segment wall thickening involving the rectum and sigmoid colon suggesting a nonspecific colitis. Few "scattered colonic diverticula without inflammation to suggest acute diverticulitis.At that time he had a fever. Given ciprofloxacin and Flagyl and discharged home. He returned back to the ER 4 days later on 10/17/2018. Had up to 12 episodes of loose stools. Test for C. difficile was negative. Test for GI PCR was also negative. Hemoglobin 15.2 g..  Since 10/14/2018 he has been having on and off lower abdominal pain cramping in nature occurring almost every day lasting for a few seconds. Nonradiating. Worse while eating. Not  really relieved by defecation.Had been takingAdvil once a week,abdominal painassociated with gas and bloating. Hedidfeel that on days when he has more bloating the pain is more intense. Hehadnoticed black tarry stools on occasions. Takes Prilosec but at night just before bedtime.Was consuming a lot of artificial sweeteners at his initial visit  I had performed his last colonoscopy over 2 years back for screening and noted an ileocolonic anastomosis and 2 benign polyps resected.  01/02/2019: H. pylori breath test negative, CBC normal, CMP normal except an elevated glucose of 214. Lipase normal.  01/11/2019: EGD: Features of gastritis seen on endoscopy biopsies of which were taken and showed no active inflammation. Biopsies of the duodenum were negative for celiac disease. At the same time Flexible sigmoidoscopy performed the mucosa of the colon appeared completely normal biopsies of which were also taken and showed no abnormalities   Interval history3/04/2019-05/01/2019  04/22/2019: C. difficile toxin negative fecal calprotectin elevated at 692, GI PCR negative.  Did much better while on Xifaxan.  Prior to commencing on Xifaxan having 10 bowel movements per day and since then having only 5 bowel movements per day.  He is taking Questran as needed but not regularly.    Current Outpatient Medications  Medication Sig Dispense Refill  . acetaminophen (TYLENOL) 500 MG tablet Take 1,000 mg by mouth every 8 (eight) hours as needed for moderate pain or headache.    Marland Kitchen aspirin EC 81 MG tablet Take 81 mg by mouth daily.    Marland Kitchen bismuth subsalicylate (PEPTO BISMOL) 262 MG/15ML suspension Take 30 mLs by mouth every 6 (six) hours as needed for indigestion.    Marland Kitchen  butalbital-acetaminophen-caffeine (FIORICET) 50-325-40 MG tablet TAKE 1 TABLET BY MOUTH 2 (TWO) TIMES DAILY AS NEEDED FOR HEADACHE. 30 tablet 4  . chlorthalidone (HYGROTON) 50 MG tablet Take 0.5 tablets (25 mg total) by mouth daily.  90 tablet 3  . cholestyramine (QUESTRAN) 4 g packet Take 1 packet (4 g total) by mouth 2 (two) times daily. 60 packet 2  . CINNAMON PO Take 1,200 mg by mouth daily.    . COLLAGEN PO Take 6,000 mg by mouth daily.    . fenofibrate 160 MG tablet Take 1 tablet (160 mg total) by mouth daily. 90 tablet 3  . Homeopathic Products (THERAWORX RELIEF) FOAM Apply 1 application topically daily as needed (pain/cramps).    . hyoscyamine (LEVSIN) 0.125 MG tablet TAKE 1 TABLET BY MOUTH 4 TIMES A DAY AS NEEDED FOR PAIN (Patient taking differently: Take 0.125 mg by mouth 4 (four) times daily as needed (pain). ) 360 tablet 0  . imipramine (TOFRANIL) 25 MG tablet Take 1 tablet (25 mg total) by mouth at bedtime. 30 tablet 5  . losartan (COZAAR) 100 MG tablet TAKE 1 TABLET BY MOUTH EVERY DAY. TAKE WITH HCTZ (Patient taking differently: Take 100 mg by mouth daily. ) 90 tablet 3  . Magnesium 100 MG CAPS Take 200 mg by mouth daily.    . Menthol, Topical Analgesic, (BIOFREEZE EX) Apply 1 application topically daily as needed (pain).    . metFORMIN (GLUCOPHAGE) 1000 MG tablet TAKE 1 TABLET (1,000 MG TOTAL) BY MOUTH 2 (TWO) TIMES DAILY WITH A MEAL. 180 tablet 1  . metoprolol succinate (TOPROL-XL) 100 MG 24 hr tablet Take 1 tablet (100 mg total) by mouth daily. Take with or immediately following a meal. 90 tablet 3  . metroNIDAZOLE (FLAGYL) 500 MG tablet Take 1 tablet (500 mg total) by mouth 2 (two) times daily. 14 tablet 0  . naproxen sodium (ALEVE) 220 MG tablet Take 440 mg by mouth at bedtime as needed (pain).    Marland Kitchen omeprazole (PRILOSEC OTC) 20 MG tablet Take 1 tablet (20 mg total) by mouth daily. 30 tablet 12  . ondansetron (ZOFRAN-ODT) 4 MG disintegrating tablet Take 1 tablet (4 mg total) by mouth every 8 (eight) hours as needed for nausea or vomiting. 30 tablet 0  . pioglitazone (ACTOS) 15 MG tablet Take 1 tablet (15 mg total) by mouth daily. 30 tablet 11  . rifaximin (XIFAXAN) 550 MG TABS tablet Take 1 tablet (550 mg  total) by mouth 2 (two) times daily for 14 days. 28 tablet 0  . rosuvastatin (CRESTOR) 20 MG tablet Take 1 tablet (20 mg total) by mouth daily. 90 tablet 3  . Saw Palmetto, Serenoa repens, (SAW PALMETTO PO) Take 500 mg by mouth daily.    . Simethicone (GAS-X EXTRA STRENGTH) 125 MG CAPS Take 125 mg by mouth daily as needed (gas).    . tadalafil (CIALIS) 20 MG tablet TAKE 0.5 TABLETS (10 MG TOTAL) BY MOUTH DAILY AS NEEDED FOR ERECTILE DYSFUNCTION. (Patient taking differently: Take 20 mg by mouth daily as needed for erectile dysfunction. ) 10 tablet 5  . VASCEPA 1 g capsule TAKE 2 CAPSULES (2 G TOTAL) BY MOUTH 2 (TWO) TIMES DAILY. (Patient taking differently: Take 2 g by mouth 2 (two) times daily. ) 120 capsule 11  . vitamin C (ASCORBIC ACID) 250 MG tablet Take 1,000 mg by mouth daily.     No current facility-administered medications for this visit.    Allergies as of 05/01/2019 - Review Complete 04/30/2019  Allergen Reaction Noted  . Mucinex [guaifenesin er] Other (See Comments) 05/29/2014    Review of Systems:    All systems reviewed and negative except where noted in HPI.   Observations/Objective:  Labs: CMP     Component Value Date/Time   NA 140 02/07/2019 1448   NA 135 (L) 01/29/2013 0111   K 4.5 02/07/2019 1448   K 4.1 01/29/2013 0111   CL 97 02/07/2019 1448   CL 103 01/29/2013 0111   CO2 29 02/07/2019 1448   CO2 28 01/29/2013 0111   GLUCOSE 178 (H) 02/07/2019 1448   GLUCOSE 182 (H) 10/17/2018 1720   GLUCOSE 135 (H) 01/29/2013 0111   BUN 21 02/07/2019 1448   BUN 25 (H) 01/29/2013 0111   CREATININE 1.04 02/07/2019 1448   CREATININE 1.01 01/29/2013 0111   CALCIUM 10.2 02/07/2019 1448   CALCIUM 9.2 01/29/2013 0111   PROT 7.4 01/02/2019 1113   ALBUMIN 4.9 01/02/2019 1113   AST 35 01/02/2019 1113   ALT 35 01/02/2019 1113   ALKPHOS 102 01/02/2019 1113   BILITOT 0.6 01/02/2019 1113   GFRNONAA 82 02/07/2019 1448   GFRNONAA >60 01/29/2013 0111   GFRAA 95 02/07/2019 1448    GFRAA >60 01/29/2013 0111   Lab Results  Component Value Date   WBC 9.5 02/07/2019   HGB 15.5 02/07/2019   HCT 42.6 02/07/2019   MCV 84 02/07/2019   PLT 213 02/07/2019    Imaging Studies: No results found.  Assessment and Plan:   Robert Franklin is a 54 y.o. y/o malehere to follow-upfor abdominal pain, diarrhea.  Stool test did not show any infection but he has an elevated fecal calprotectin.  Did respond to Xifaxan for IBS-D.  He was doing much better on the antibiotics than he did while off of it.  I believe he may benefit from another 14 days of Xifaxan.  At the end of the treatment if he is not back to normal bowel movements then will consider MR enterography to evaluate the small bowel to rule out Crohn's disease.  This I would do since he had an elevated fecal calprotectin.  If he responds completely to the Xifaxan then I may check the fecal calprotectin again to ensure that it has normalized. I have also suggested he take the Questran 1 sachet twice a day. Telephone visit in 3 weeks   I discussed the assessment and treatment plan with the patient. The patient was provided an opportunity to ask questions and all were answered. The patient agreed with the plan and demonstrated an understanding of the instructions.   The patient was advised to call back or seek an in-person evaluation if the symptoms worsen or if the condition fails to improve as anticipated.  I provided 11 minutes of non-face-to-face time during this encounter.  Dr Robert Bellows MD,MRCP [facility]) Gastroenterology/Hepatology Pager: [phone]   Speech recognition software was used to dictate this note.

## 2019-05-02 ENCOUNTER — Other Ambulatory Visit: Payer: Self-pay

## 2019-05-02 MED ORDER — RIFAXIMIN 550 MG PO TABS: 550.0000 mg | ORAL_TABLET | Freq: Two times a day (BID) | ORAL | 0 refills | Status: AC

## 2019-05-07 ENCOUNTER — Telehealth: Payer: Self-pay | Admitting: Neurology

## 2019-05-07 NOTE — Telephone Encounter (Signed)
no to the covid questions MR Brain wo contrast Dr. Arlyss Queen Auth: [place] Ref # [id number]. Patient is scheduled at [facility] for 05/08/19.

## 2019-05-08 ENCOUNTER — Ambulatory Visit (INDEPENDENT_AMBULATORY_CARE_PROVIDER_SITE_OTHER): Payer: Commercial Managed Care - PPO

## 2019-05-08 ENCOUNTER — Other Ambulatory Visit: Payer: Self-pay

## 2019-05-08 DIAGNOSIS — R519 Headache, unspecified: Secondary | ICD-10-CM | POA: Diagnosis not present

## 2019-05-08 DIAGNOSIS — R42 Dizziness and giddiness: Secondary | ICD-10-CM

## 2019-05-10 ENCOUNTER — Telehealth: Payer: Self-pay | Admitting: *Deleted

## 2019-05-10 NOTE — Telephone Encounter (Signed)
-----   Message from Britt Bottom, MD sent at 05/10/2019 10:26 AM EDT ----- Please let the patient know that the MRI of the brain was normal

## 2019-05-23 ENCOUNTER — Other Ambulatory Visit: Payer: Self-pay | Admitting: Neurology

## 2019-05-25 ENCOUNTER — Other Ambulatory Visit: Payer: Self-pay | Admitting: Cardiology

## 2019-05-28 ENCOUNTER — Ambulatory Visit (INDEPENDENT_AMBULATORY_CARE_PROVIDER_SITE_OTHER): Payer: Commercial Managed Care - PPO | Admitting: Gastroenterology

## 2019-05-28 ENCOUNTER — Other Ambulatory Visit: Payer: Self-pay

## 2019-05-28 DIAGNOSIS — K580 Irritable bowel syndrome with diarrhea: Secondary | ICD-10-CM

## 2019-05-28 DIAGNOSIS — R1030 Lower abdominal pain, unspecified: Secondary | ICD-10-CM

## 2019-05-28 NOTE — Progress Notes (Signed)
Robert Franklin , MD [address]  [place]  [place], [place] [address]  Main: [phone]  Fax: [phone]   Primary Care Physician: [name]., MD  Virtual Visit via Telephone Note  I connected with patient on 05/28/19 at  1:00 PM EDT by telephone and verified that I am speaking with the correct person using two identifiers.   I discussed the limitations, risks, security and privacy concerns of performing an evaluation and management service by telephone and the availability of in person appointments. I also discussed with the patient that there may be a patient responsible charge related to this service. The patient expressed understanding and agreed to proceed.  Location of Patient: Home Location of Provider: Home Persons involved: Patient and provider only   History of Present Illness:  Follow up for diarrhea  HPI: Yves Disantis is a 54 y.o. male   Summary of history : Was initially referred and seen on 01/02/2019 after he presented to the emergency room in 10/14/2018 with nausea vomiting diarrhea and abdominal pain. Prior history of appendectomy and cholecystectomy. At the time of his appendectomy he had had extensive surgery as his appendix had ruptured. In the ER underwent a CT scan of the abdomen.which showed mild long segment wall thickening involving the rectum and sigmoid colon suggesting a nonspecific colitis. Few "scattered colonic diverticula without inflammation to suggest acute diverticulitis.At that time he had a fever. Given ciprofloxacin and Flagyl and discharged home. He returned back to the ER 4 days later on 10/17/2018. Had up to 12 episodes of loose stools. Test for C. difficile was negative. Test for GI PCR was also negative. Hemoglobin 15.2 g..  Since 10/14/2018 he has been having on and off lower abdominal pain cramping in nature occurring almost every day lasting for a few seconds. Nonradiating. Worse while eating. Not  really relieved by defecation.Had been takingAdvil once a week,abdominal painassociated with gas and bloating. Hedidfeel that on days when he has more bloating the pain is more intense. Hehadnoticed black tarry stools on occasions. Takes Prilosec but at night just before bedtime.Was consuming a lot of artificial sweeteners at his initial visit  I had performed his last colonoscopy over 2 years back for screening and noted an ileocolonic anastomosis and 2 benign polyps resected.  01/02/2019: H. pylori breath test negative, CBC normal, CMP normal except an elevated glucose of 214. Lipase normal.  01/11/2019: EGD: Features of gastritis seen on endoscopy biopsies of which were taken and showed no active inflammation. Biopsies of the duodenum were negative for celiac disease. At the same time Flexible sigmoidoscopy performed the mucosa of the colon appeared completely normal biopsies of which were also taken and showed no abnormalities   04/22/2019: C. difficile toxin negative fecal calprotectin elevated at 692, GI PCR negative.  Did much better while on Xifaxan.  Prior to commencing on Xifaxan having 10 bowel movements per day and since then having only 5 bowel movements per day.  He is taking Questran as needed but not regularly.    Interval history4/07/2019-05/28/2019  He states that he completed the course of Xifaxan a few days back.  He cannot say for sure if he feels better after the Xifaxan or not.  Continues to have multiple bowel movements per day.  Sometimes formed sometimes soft.  He has been taking Questran on and off.  Still has abdominal pain on and off.     Current Outpatient Medications  Medication Sig Dispense Refill  .  acetaminophen (TYLENOL) 500 MG tablet Take 1,000 mg by mouth every 8 (eight) hours as needed for moderate pain or headache.    Marland Kitchen aspirin EC 81 MG tablet Take 81 mg by mouth daily.    Marland Kitchen bismuth subsalicylate (PEPTO BISMOL) 262 MG/15ML  suspension Take 30 mLs by mouth every 6 (six) hours as needed for indigestion.    . butalbital-acetaminophen-caffeine (FIORICET) 50-325-40 MG tablet TAKE 1 TABLET BY MOUTH 2 (TWO) TIMES DAILY AS NEEDED FOR HEADACHE. 30 tablet 4  . chlorthalidone (HYGROTON) 50 MG tablet Take 0.5 tablets (25 mg total) by mouth daily. 90 tablet 3  . cholestyramine (QUESTRAN) 4 g packet Take 1 packet (4 g total) by mouth 2 (two) times daily. 60 packet 2  . CINNAMON PO Take 1,200 mg by mouth daily.    . COLLAGEN PO Take 6,000 mg by mouth daily.    . fenofibrate 160 MG tablet Take 1 tablet (160 mg total) by mouth daily. 90 tablet 3  . Homeopathic Products (THERAWORX RELIEF) FOAM Apply 1 application topically daily as needed (pain/cramps).    . hyoscyamine (LEVSIN) 0.125 MG tablet TAKE 1 TABLET BY MOUTH 4 TIMES A DAY AS NEEDED FOR PAIN (Patient taking differently: Take 0.125 mg by mouth 4 (four) times daily as needed (pain). ) 360 tablet 0  . imipramine (TOFRANIL) 25 MG tablet TAKE 1 TABLET BY MOUTH EVERYDAY AT BEDTIME 90 tablet 1  . losartan (COZAAR) 100 MG tablet TAKE 1 TABLET BY MOUTH EVERY DAY. TAKE WITH HCTZ (Patient taking differently: Take 100 mg by mouth daily. ) 90 tablet 3  . Magnesium 100 MG CAPS Take 200 mg by mouth daily.    . Menthol, Topical Analgesic, (BIOFREEZE EX) Apply 1 application topically daily as needed (pain).    . metFORMIN (GLUCOPHAGE) 1000 MG tablet TAKE 1 TABLET (1,000 MG TOTAL) BY MOUTH 2 (TWO) TIMES DAILY WITH A MEAL. 180 tablet 1  . metoprolol succinate (TOPROL-XL) 100 MG 24 hr tablet Take 1 tablet (100 mg total) by mouth daily. Take with or immediately following a meal. 90 tablet 3  . metroNIDAZOLE (FLAGYL) 500 MG tablet Take 1 tablet (500 mg total) by mouth 2 (two) times daily. 14 tablet 0  . naproxen sodium (ALEVE) 220 MG tablet Take 440 mg by mouth at bedtime as needed (pain).    Marland Kitchen omeprazole (PRILOSEC OTC) 20 MG tablet Take 1 tablet (20 mg total) by mouth daily. 30 tablet 12  .  ondansetron (ZOFRAN-ODT) 4 MG disintegrating tablet Take 1 tablet (4 mg total) by mouth every 8 (eight) hours as needed for nausea or vomiting. 30 tablet 0  . pioglitazone (ACTOS) 15 MG tablet Take 1 tablet (15 mg total) by mouth daily. 30 tablet 11  . rosuvastatin (CRESTOR) 20 MG tablet Take 1 tablet (20 mg total) by mouth daily. 90 tablet 3  . Saw Palmetto, Serenoa repens, (SAW PALMETTO PO) Take 500 mg by mouth daily.    . Simethicone (GAS-X EXTRA STRENGTH) 125 MG CAPS Take 125 mg by mouth daily as needed (gas).    . tadalafil (CIALIS) 20 MG tablet TAKE 0.5 TABLETS (10 MG TOTAL) BY MOUTH DAILY AS NEEDED FOR ERECTILE DYSFUNCTION. (Patient taking differently: Take 20 mg by mouth daily as needed for erectile dysfunction. ) 10 tablet 5  . VASCEPA 1 g capsule TAKE 2 CAPSULES (2 G TOTAL) BY MOUTH 2 (TWO) TIMES DAILY. (Patient taking differently: Take 2 g by mouth 2 (two) times daily. ) 120 capsule 11  .  vitamin C (ASCORBIC ACID) 250 MG tablet Take 1,000 mg by mouth daily.     No current facility-administered medications for this visit.    Allergies as of 05/28/2019 - Review Complete 04/30/2019  Allergen Reaction Noted  . Mucinex [guaifenesin er] Other (See Comments) 05/29/2014    Review of Systems:    All systems reviewed and negative except where noted in HPI.   Observations/Objective:  Labs: CMP     Component Value Date/Time   NA 140 02/07/2019 1448   NA 135 (L) 01/29/2013 0111   K 4.5 02/07/2019 1448   K 4.1 01/29/2013 0111   CL 97 02/07/2019 1448   CL 103 01/29/2013 0111   CO2 29 02/07/2019 1448   CO2 28 01/29/2013 0111   GLUCOSE 178 (H) 02/07/2019 1448   GLUCOSE 182 (H) 10/17/2018 1720   GLUCOSE 135 (H) 01/29/2013 0111   BUN 21 02/07/2019 1448   BUN 25 (H) 01/29/2013 0111   CREATININE 1.04 02/07/2019 1448   CREATININE 1.01 01/29/2013 0111   CALCIUM 10.2 02/07/2019 1448   CALCIUM 9.2 01/29/2013 0111   PROT 7.4 01/02/2019 1113   ALBUMIN 4.9 01/02/2019 1113   AST 35  01/02/2019 1113   ALT 35 01/02/2019 1113   ALKPHOS 102 01/02/2019 1113   BILITOT 0.6 01/02/2019 1113   GFRNONAA 82 02/07/2019 1448   GFRNONAA >60 01/29/2013 0111   GFRAA 95 02/07/2019 1448   GFRAA >60 01/29/2013 0111   Lab Results  Component Value Date   WBC 9.5 02/07/2019   HGB 15.5 02/07/2019   HCT 42.6 02/07/2019   MCV 84 02/07/2019   PLT 213 02/07/2019    Imaging Studies: MR BRAIN WO CONTRAST  Result Date: 05/09/2019  [facility] [address], [place], [place] [address] [phone] NEUROIMAGING REPORT STUDY DATE: 05/08/2019 PATIENT NAME: Corderio Halfacre DOB: 1965-12-02 MRN: [id number] EXAM: MRI Brain without contrast ORDERING CLINICIAN: Richard A. Sater, MD. PhD CLINICAL HISTORY: 54 year old man with headaches and vertigo COMPARISON FILMS: None TECHNIQUE: MRI of the brain with and without contrast was obtained utilizing 5 mm axial slices with T1, T2, T2 flair, T2 star gradient echo and diffusion weighted views.  T1 sagittal, T2 coronal  were also obtained. CONTRAST: none IMAGING SITE: [facility], [address] FINDINGS: On sagittal images, the spinal cord is imaged caudally to C3 and is normal in caliber.   The contents of the posterior fossa are of normal size and position.   The pituitary gland and optic chiasm appear normal.    Brain volume appears normal.   The ventricles are normal in size and without distortion.  There are no abnormal extra-axial collections of fluid.  The cerebellum and brainstem appears normal.   The deep gray matter appears normal.  The cerebral hemispheres appear normal.  Diffusion weighted images are normal.  Gradient echo heme weighted images are normal.  The orbits appear normal.   The VIIth/VIIIth nerve complex appears normal.  The mastoid air cells appear normal.  The paranasal sinuses appear normal.  Flow voids are identified within the major intracerebral arteries.     This is a normal noncontrasted MRI of  the brain. INTERPRETING PHYSICIAN: Richard A. Felecia Shelling, MD, PhD, FAAN Certified in  Neuroimaging by [employer]    Assessment and Plan:   Cashus Wagy is a 54 y.o. y/o malehere to follow-upfor abdominal pain, diarrhea.  Stool test did not show any infection but he has an elevated fecal calprotectin.  Did respond to Xifaxan for IBS-D initially and had 2 courses of the same.  Since the symptoms have not completely resolved I suggested that we proceed with an MR enterography to determine if he has any inflammation of the small bowel.  If negative will need to treated as irritable bowel syndrome with diarrhea.  Could consider testing for SIBO at that point of time.  Could consider Viberzi.  Continue Questran.  He requested FMLA papers to be filled I said that I could fill them to the extent that I would mention he can have unlimited access to the restroom until we have a definite diagnosis to provide.  I discussed the assessment and treatment plan with the patient. The patient was provided an opportunity to ask questions and all were answered. The patient agreed with the plan and demonstrated an understanding of the instructions.   The patient was advised to call back or seek an in-person evaluation if the symptoms worsen or if the condition fails to improve as anticipated.  I provided 15 minutes minutes of non-face-to-face time during this encounter.  Dr Robert Bellows MD,MRCP [facility]) Gastroenterology/Hepatology Pager: [phone]  Follow-up in 6 weeks Speech recognition software was used to dictate this note.

## 2019-05-30 NOTE — Progress Notes (Signed)
Established patient visit  I,[name],acting as a scribe for [name], MD.,have documented all relevant documentation on the behalf of [name], MD,as directed by  [name], MD while in the presence of [name], MD.   Patient: Robert Franklin   DOB: 1965/10/21   54 y.o. Male  MRN: [id number] Visit Date: 06/04/2019  Today's healthcare provider: [name], MD   Chief Complaint  Patient presents with  . Follow-up  . Diabetes  . Hypertension   Subjective    HPI  Recheck blood pressure is 124/85 with heart rate of 84 Diabetes Mellitus Type II, follow-up  Lab Results  Component Value Date   HGBA1C 6.3 (A) 06/04/2019   HGBA1C 8.2 (A) 03/07/2019   HGBA1C 6.9 (A) 11/07/2018   Last seen for diabetes 3 months ago.  Management since then includes; Started pioglitazone 15 mg daily. He reports good compliance with treatment. He is not having side effects. none  Home blood sugar records: fasting range: 120/135  Episodes of hypoglycemia? No    Current insulin regiment: n/a Most Recent Eye Exam: 04/09/2019 at [facility] in [place]   --------------------------------------------------------------------  Hypertension, follow-up  BP Readings from Last 3 Encounters:  06/04/19 124/85  04/30/19 121/83  03/07/19 112/73   Wt Readings from Last 3 Encounters:  06/04/19 225 lb (102.1 kg)  04/30/19 231 lb 6.4 oz (105 kg)  03/07/19 233 lb (105.7 kg)     He was last seen for hypertension 3 months ago.  BP at that visit was 112/73. Management since that visit includes; Controlled. He reports good compliance with treatment. He is not having side effects. none He is not exercising. He is adherent to low salt diet.   Outside blood pressures are none.  He does not smoke.  Use of agents associated with hypertension: none and prn.   --------------------------------------------------------------------  Increased frequency  of headaches From 03/07/2019-referred to neurology.  Arteriosclerosis of coronary artery From 03/07/2019-He had recent cardiology evaluation including a left heart cath which revealed a proximal RCA 40% stenosis, diffuse aneurysmal/ectatic dominant circumflex artery.. EF is 55 to 65%.  Obesity, Class I, BMI 30.0-34.9 (see actual BMI) From 03/07/2019-Diet and exercise continue to be stressed.      Medications: Outpatient Medications Prior to Visit  Medication Sig  . acetaminophen (TYLENOL) 500 MG tablet Take 1,000 mg by mouth every 8 (eight) hours as needed for moderate pain or headache.  . bismuth subsalicylate (PEPTO BISMOL) 262 MG/15ML suspension Take 30 mLs by mouth every 6 (six) hours as needed for indigestion.  . butalbital-acetaminophen-caffeine (FIORICET) 50-325-40 MG tablet TAKE 1 TABLET BY MOUTH 2 (TWO) TIMES DAILY AS NEEDED FOR HEADACHE.  . chlorthalidone (HYGROTON) 50 MG tablet Take 0.5 tablets (25 mg total) by mouth daily.  . cholestyramine (QUESTRAN) 4 g packet Take 1 packet (4 g total) by mouth 2 (two) times daily.  Marland Kitchen CINNAMON PO Take 1,200 mg by mouth daily.  . COLLAGEN PO Take 6,000 mg by mouth daily.  . fenofibrate 160 MG tablet Take 1 tablet (160 mg total) by mouth daily.  . Homeopathic Products (THERAWORX RELIEF) FOAM Apply 1 application topically daily as needed (pain/cramps).  . hyoscyamine (LEVSIN) 0.125 MG tablet TAKE 1 TABLET BY MOUTH 4 TIMES A DAY AS NEEDED FOR PAIN (Patient taking differently: Take 0.125 mg by mouth 4 (four) times daily as needed (pain). )  . imipramine (TOFRANIL) 25 MG tablet TAKE 1 TABLET BY MOUTH EVERYDAY AT BEDTIME  .  losartan (COZAAR) 100 MG tablet TAKE 1 TABLET BY MOUTH EVERY DAY. TAKE WITH HCTZ (Patient taking differently: Take 100 mg by mouth daily. )  . Magnesium 100 MG CAPS Take 200 mg by mouth daily.  . Menthol, Topical Analgesic, (BIOFREEZE EX) Apply 1 application topically daily as needed (pain).  . metFORMIN (GLUCOPHAGE) 1000 MG  tablet TAKE 1 TABLET (1,000 MG TOTAL) BY MOUTH 2 (TWO) TIMES DAILY WITH A MEAL.  . metoprolol succinate (TOPROL-XL) 100 MG 24 hr tablet Take 1 tablet (100 mg total) by mouth daily. Take with or immediately following a meal.  . naproxen sodium (ALEVE) 220 MG tablet Take 440 mg by mouth at bedtime as needed (pain).  Marland Kitchen omeprazole (PRILOSEC OTC) 20 MG tablet Take 1 tablet (20 mg total) by mouth daily.  . ondansetron (ZOFRAN-ODT) 4 MG disintegrating tablet Take 1 tablet (4 mg total) by mouth every 8 (eight) hours as needed for nausea or vomiting.  . pioglitazone (ACTOS) 15 MG tablet Take 1 tablet (15 mg total) by mouth daily.  . rosuvastatin (CRESTOR) 20 MG tablet TAKE 1 TABLET BY MOUTH EVERY DAY  . Saw Palmetto, Serenoa repens, (SAW PALMETTO PO) Take 500 mg by mouth daily.  . Simethicone (GAS-X EXTRA STRENGTH) 125 MG CAPS Take 125 mg by mouth daily as needed (gas).  . tadalafil (CIALIS) 20 MG tablet TAKE 0.5 TABLETS (10 MG TOTAL) BY MOUTH DAILY AS NEEDED FOR ERECTILE DYSFUNCTION. (Patient taking differently: Take 20 mg by mouth daily as needed for erectile dysfunction. )  . vitamin C (ASCORBIC ACID) 250 MG tablet Take 1,000 mg by mouth daily.  Marland Kitchen aspirin EC 81 MG tablet Take 81 mg by mouth daily.  . metroNIDAZOLE (FLAGYL) 500 MG tablet Take 1 tablet (500 mg total) by mouth 2 (two) times daily. (Patient not taking: Reported on 06/04/2019)  . VASCEPA 1 g capsule TAKE 2 CAPSULES (2 G TOTAL) BY MOUTH 2 (TWO) TIMES DAILY. (Patient not taking: No sig reported)   No facility-administered medications prior to visit.    Review of Systems  Constitutional: Negative for appetite change, chills and fever.  Eyes: Negative.   Respiratory: Negative for chest tightness, shortness of breath and wheezing.   Cardiovascular: Negative for chest pain and palpitations.  Gastrointestinal: Negative for abdominal pain, nausea and vomiting.  Endocrine: Negative.   Genitourinary: Negative.   Allergic/Immunologic: Negative.    Hematological: Negative.   Psychiatric/Behavioral: Negative.     Last hemoglobin A1c Lab Results  Component Value Date   HGBA1C 6.3 (A) 06/04/2019      Objective    BP 124/85 (BP Location: Right Arm, Cuff Size: Large)   Pulse 84   Temp (!) 97.3 F (36.3 C) (Other (Comment))   Resp 16   Ht 5\' 9"  (1.753 m)   Wt 225 lb (102.1 kg)   SpO2 98%   BMI 33.23 kg/m  BP Readings from Last 3 Encounters:  06/04/19 124/85  04/30/19 121/83  03/07/19 112/73   Wt Readings from Last 3 Encounters:  06/04/19 225 lb (102.1 kg)  04/30/19 231 lb 6.4 oz (105 kg)  03/07/19 233 lb (105.7 kg)      Physical Exam Vitals reviewed.  Constitutional:      Appearance: He is well-developed.  HENT:     Head: Normocephalic and atraumatic.     Right Ear: External ear normal.     Left Ear: External ear normal.     Nose: Nose normal.  Eyes:     General: No scleral  icterus.    Conjunctiva/sclera: Conjunctivae normal.  Neck:     Thyroid: No thyromegaly.  Cardiovascular:     Rate and Rhythm: Normal rate and regular rhythm.     Heart sounds: Normal heart sounds.  Pulmonary:     Effort: Pulmonary effort is normal.     Breath sounds: Normal breath sounds.  Abdominal:     Palpations: Abdomen is soft.  Skin:    General: Skin is warm and dry.  Neurological:     General: No focal deficit present.     Mental Status: He is alert and oriented to person, place, and time.  Psychiatric:        Mood and Affect: Mood normal.        Behavior: Behavior normal.        Thought Content: Thought content normal.        Judgment: Judgment normal.       Results for orders placed or performed in visit on 06/04/19  POCT HgB A1C  Result Value Ref Range   Hemoglobin A1C 6.3 (A) 4.0 - 5.6 %   Est. average glucose Bld gHb Est-mCnc 134     Assessment & Plan     1. Type 2 diabetes mellitus without complication, without long-term current use of insulin (HCC) A1c of 6.3 today.  Good control.  Continue Metformin  and pioglitazone - POCT HgB A1C  2. Other migraine without status migrainosus, not intractable Typical migraine for the patient.  3. Essential hypertension Fair control on chlorthalidone, losartan, metoprolol.  4. Obstructive sleep apnea syndrome On CPAP.  5. Abnormally low high density lipoprotein (HDL) cholesterol with hypertriglyceridemia    Return in 4 months (on 10/10/2019) for CPE.      I, [name], MD, have reviewed all documentation for this visit. The documentation on 06/09/19 for the exam, diagnosis, procedures, and orders are all accurate and complete.    Lesli Issa Cranford Mon, MD  [facility] [phone] (phone) [phone] (fax)  [place]

## 2019-06-04 ENCOUNTER — Ambulatory Visit (INDEPENDENT_AMBULATORY_CARE_PROVIDER_SITE_OTHER): Payer: Commercial Managed Care - PPO | Admitting: Family Medicine

## 2019-06-04 ENCOUNTER — Other Ambulatory Visit: Payer: Self-pay

## 2019-06-04 ENCOUNTER — Encounter: Payer: Self-pay | Admitting: Family Medicine

## 2019-06-04 VITALS — BP 124/85 | HR 84 | Temp 97.3°F | Resp 16 | Ht 69.0 in | Wt 225.0 lb

## 2019-06-04 DIAGNOSIS — G43809 Other migraine, not intractable, without status migrainosus: Secondary | ICD-10-CM | POA: Diagnosis not present

## 2019-06-04 DIAGNOSIS — G4733 Obstructive sleep apnea (adult) (pediatric): Secondary | ICD-10-CM | POA: Diagnosis not present

## 2019-06-04 DIAGNOSIS — I10 Essential (primary) hypertension: Secondary | ICD-10-CM | POA: Diagnosis not present

## 2019-06-04 DIAGNOSIS — E786 Lipoprotein deficiency: Secondary | ICD-10-CM

## 2019-06-04 DIAGNOSIS — E119 Type 2 diabetes mellitus without complications: Secondary | ICD-10-CM | POA: Diagnosis not present

## 2019-06-04 DIAGNOSIS — E781 Pure hyperglyceridemia: Secondary | ICD-10-CM

## 2019-06-04 LAB — POCT GLYCOSYLATED HEMOGLOBIN (HGB A1C)
Est. average glucose Bld gHb Est-mCnc: 134
Hemoglobin A1C: 6.3 % — AB (ref 4.0–5.6)

## 2019-06-05 ENCOUNTER — Other Ambulatory Visit: Payer: Self-pay

## 2019-06-05 DIAGNOSIS — R1030 Lower abdominal pain, unspecified: Secondary | ICD-10-CM

## 2019-06-07 ENCOUNTER — Other Ambulatory Visit: Payer: Self-pay | Admitting: Gastroenterology

## 2019-06-18 ENCOUNTER — Other Ambulatory Visit: Payer: Self-pay

## 2019-06-18 DIAGNOSIS — K580 Irritable bowel syndrome with diarrhea: Secondary | ICD-10-CM

## 2019-06-18 DIAGNOSIS — R1030 Lower abdominal pain, unspecified: Secondary | ICD-10-CM

## 2019-06-20 ENCOUNTER — Ambulatory Visit
Admission: RE | Admit: 2019-06-20 | Discharge: 2019-06-20 | Disposition: A | Discharge status: Home / Self Care | Payer: Commercial Managed Care - PPO | Source: Ambulatory Visit | Attending: Gastroenterology | Admitting: Gastroenterology

## 2019-06-20 ENCOUNTER — Other Ambulatory Visit: Payer: Self-pay

## 2019-06-20 ENCOUNTER — Telehealth: Payer: Self-pay

## 2019-06-20 DIAGNOSIS — K580 Irritable bowel syndrome with diarrhea: Secondary | ICD-10-CM | POA: Insufficient documentation

## 2019-06-20 DIAGNOSIS — R1030 Lower abdominal pain, unspecified: Secondary | ICD-10-CM | POA: Insufficient documentation

## 2019-06-20 MED ORDER — GADOBUTROL 1 MMOL/ML IV SOLN
10.0000 mL | Freq: Once | INTRAVENOUS | Status: AC | PRN
  Administered 2019-06-20: 10 mL via INTRAVENOUS

## 2019-06-20 NOTE — Telephone Encounter (Signed)
-----   Message from Jonathon Bellows, MD sent at 06/20/2019  3:34 PM EDT ----- Inform MRI is normal   1. IF still having diarrhea commence on Questran BID, if already tried that and failed than Xifaxan 550mg  TID for 14 days

## 2019-06-20 NOTE — Telephone Encounter (Signed)
Called and left a message for call back  

## 2019-06-20 NOTE — Progress Notes (Signed)
Inform MRI is normal   1. IF still having diarrhea commence on Questran BID, if already tried that and failed than Xifaxan 550mg  TID for 14 days

## 2019-06-21 NOTE — Telephone Encounter (Signed)
Patient states he is taking the Questran and is not having any diarrhea.

## 2019-07-14 ENCOUNTER — Other Ambulatory Visit: Payer: Self-pay | Admitting: Gastroenterology

## 2019-07-20 ENCOUNTER — Other Ambulatory Visit: Payer: Self-pay | Admitting: Family Medicine

## 2019-07-20 NOTE — Telephone Encounter (Signed)
Requested Prescriptions  Pending Prescriptions Disp Refills   losartan (COZAAR) 100 MG tablet [Pharmacy Med Name: LOSARTAN POTASSIUM 100 MG TAB] 90 tablet 1    Sig: TAKE 1 TABLET BY MOUTH EVERY DAY. TAKE WITH HCTZ     Cardiovascular:  Angiotensin Receptor Blockers Passed - 07/20/2019 11:39 AM      Passed - Cr in normal range and within 180 days    Creatinine  Date Value Ref Range Status  01/29/2013 1.01 0.60 - 1.30 mg/dL Final   Creatinine, Ser  Date Value Ref Range Status  02/07/2019 1.04 0.76 - 1.27 mg/dL Final         Passed - K in normal range and within 180 days    Potassium  Date Value Ref Range Status  02/07/2019 4.5 3.5 - 5.2 mmol/L Final  01/29/2013 4.1 3.5 - 5.1 mmol/L Final         Passed - Patient is not pregnant      Passed - Last BP in normal range    BP Readings from Last 1 Encounters:  06/04/19 124/85         Passed - Valid encounter within last 6 months    Recent Outpatient Visits          1 month ago Type 2 diabetes mellitus without complication, without long-term current use of insulin [facility])   [facility] [name]., MD   4 months ago Type 2 diabetes mellitus without complication, without long-term current use of insulin [facility])   [facility] [name]., MD   8 months ago Type 2 diabetes mellitus without complication, without long-term current use of insulin [facility])   [facility] [name]., MD   11 months ago Type 2 diabetes mellitus without complication, without long-term current use of insulin [facility])   [facility] [name]., MD   1 year ago Annual physical exam   [facility] [name]., MD      Future Appointments            In 2 months [name]., MD [facility]

## 2019-07-26 ENCOUNTER — Ambulatory Visit
Admission: EM | Admit: 2019-07-26 | Discharge: 2019-07-26 | Disposition: A | Discharge status: Home / Self Care | Payer: Commercial Managed Care - PPO | Attending: Emergency Medicine | Admitting: Emergency Medicine

## 2019-07-26 ENCOUNTER — Other Ambulatory Visit: Payer: Self-pay

## 2019-07-26 DIAGNOSIS — Z20822 Contact with and (suspected) exposure to covid-19: Secondary | ICD-10-CM

## 2019-07-26 NOTE — Discharge Instructions (Addendum)
Your COVID test is pending.  You should self quarantine until the test result is back.    Take Tylenol as needed for fever or discomfort.  Rest and keep yourself hydrated.    Go to the emergency department if you develop shortness of breath, severe diarrhea, high fever not relieved by Tylenol or ibuprofen, or other concerning symptoms.    

## 2019-07-26 NOTE — ED Triage Notes (Signed)
COVID exposure to mom as recent as 1800 last night. Mom lives with patient. Denies any symptoms. Requesting testing.

## 2019-07-26 NOTE — ED Provider Notes (Signed)
Robert Franklin    CSN: [id number] Arrival date & time: 07/26/19  0806      History   Chief Complaint Chief Complaint  Patient presents with  . Covid Exposure    HPI Robert Franklin is a 54 y.o. male.  Presents with request for a COVID test.  He was exposed to a positive family member at home.  He denies symptoms, including fever, chills, congestion, cough, shortness of breath, vomiting, diarrhea, rash, or other concerns.  He has not had the COVID vaccines.  No treatments attempted at home.   The history is provided by the patient.    Past Medical History:  Diagnosis Date  . Coronary artery disease, non-occlusive 02/28/2008   Dr. Humphrey Rolls @ [facility]: 2 x 40-50% lesions in dominant LCx lesion by cath,  EF 60% with normal wall motion.  . Diabetes mellitus without complication ([place])   . Essential hypertension   . GERD (gastroesophageal reflux disease)   . Headache   . High triglycerides     was previously on treatment, but  he notes that this was stopped  . Kidney stones   . Moderate obesity 05/29/2014  . OSA treated with BiPAP   . Short bowel syndrome 1994    Patient Active Problem List   Diagnosis Date Noted  . Worsening headaches 04/30/2019  . Neck pain 04/30/2019  . Bilateral occipital neuralgia 04/30/2019  . Migraine without aura and without status migrainosus, not intractable 04/30/2019  . Vertigo 04/30/2019  . Abnormal nuclear stress test 02/07/2019  . Atypical angina ([place]) 02/07/2019  . Central adiposity 08/10/2018  . DM (diabetes mellitus) ([place]) 07/08/2018  . Medication management 01/01/2016  . Hypertriglyceridemia without hypercholesterolemia 02/26/2015  . Abnormally low high density lipoprotein (HDL) cholesterol with hypertriglyceridemia 02/26/2015  . Atrial premature contractions 02/26/2015  . Metabolic syndrome 46/65/9935  . Rapid heart beat 02/26/2015  . Signs and symptoms involving emotional state 05/29/2014  . Anxiety 05/29/2014  . Adult BMI 30+  05/29/2014  . Clinical depression 05/29/2014  . Failure of erection 05/29/2014  . Abnormal LFTs 05/29/2014  . Blood glucose elevated 05/29/2014  . GERD (gastroesophageal reflux disease) 05/29/2014  . Combined fat and carbohydrate induced hyperlipemia 05/29/2014  . Essential hypertension 05/29/2014  . Headache, migraine 05/29/2014  . Obesity, Class I, BMI 30.0-34.9 (see actual BMI) 05/29/2014  . Gastroduodenal ulcer 05/29/2014  . Apnea, sleep 05/29/2014  . Umbilical hernia without obstruction and without gangrene 05/29/2014  . Avitaminosis D 09/14/2009  . Arteriosclerosis of coronary artery 02/27/2009    Past Surgical History:  Procedure Laterality Date  . "stomach wrap"  1991   GERD  . APPENDECTOMY    . CARDIAC CATHETERIZATION  02/2008   Dr. Humphrey Rolls @ [facility]: 2 x 40-50% lesions in a dominant LCx by cath,  EF 60% with normal wall motion.  [name] Nuclear Stress Test   January 2010    heart rate increased from 80-158 BPM. No EKG changes.  there is a moderate sized  reversible defect in the inferior and inferior lateral wall as well as anteroseptal. EF 59%.  . CardioNet Holter Monitor   January 2010    sinus rhythm. Rare PVCs in setting was. Current PACs , seen in singles and pairs. Max heart rate 127, minimum heart rate 57. Average 84.  . CHOLECYSTECTOMY    . COLON SURGERY     due to gangrenous appendix  . COLONOSCOPY WITH PROPOFOL N/A 04/26/2016   Procedure: COLONOSCOPY WITH PROPOFOL;  Surgeon: Bailey Mech  Vicente Males, MD;  Location: [facility] ENDOSCOPY;  Service: Endoscopy;  Laterality: N/A;  . ESOPHAGOGASTRODUODENOSCOPY (EGD) WITH PROPOFOL N/A 01/11/2019   Procedure: ESOPHAGOGASTRODUODENOSCOPY (EGD) WITH PROPOFOL;  Surgeon: Jonathon Bellows, MD;  Location: [facility] ENDOSCOPY;  Service: Gastroenterology;  Laterality: N/A;  . FLEXIBLE SIGMOIDOSCOPY N/A 01/11/2019   Procedure: FLEXIBLE SIGMOIDOSCOPY;  Surgeon: Jonathon Bellows, MD;  Location: [facility] ENDOSCOPY;  Service: Gastroenterology;  Laterality: N/A;  . HERNIA  REPAIR  2010  . INNER EAR SURGERY    . LEFT HEART CATH AND CORONARY ANGIOGRAPHY N/A 02/12/2019   Procedure: LEFT HEART CATH AND CORONARY ANGIOGRAPHY;  Surgeon: Leonie Man, MD;  Location: [facility];  Service: Cardiovascular;  Laterality: N/A;  . NM MYOVIEW LTD  01/10/2019   Treadmill Myoview: Walk 9:40 min-10.1 METS) EF ~45 to 50%.  INTERMEDIATE RISK STUDY-moderate sized mostly fixed perfusion defect in anterior apical segment.  No reversibility.  Suggests prior infarct without ischemia.  . TONSILLECTOMY    . TYMPANOSTOMY TUBE PLACEMENT Bilateral 1973       Home Medications    Prior to Admission medications   Medication Sig Start Date End Date Taking? Authorizing Provider  acetaminophen (TYLENOL) 500 MG tablet Take 1,000 mg by mouth every 8 (eight) hours as needed for moderate pain or headache.    [provider]  aspirin EC 81 MG tablet Take 81 mg by mouth daily.    [provider]  bismuth subsalicylate (PEPTO BISMOL) 262 MG/15ML suspension Take 30 mLs by mouth every 6 (six) hours as needed for indigestion.    [provider]  butalbital-acetaminophen-caffeine (FIORICET) 50-325-40 MG tablet TAKE 1 TABLET BY MOUTH 2 (TWO) TIMES DAILY AS NEEDED FOR HEADACHE. 09/06/18   [name]., MD  chlorthalidone (HYGROTON) 50 MG tablet Take 0.5 tablets (25 mg total) by mouth daily. 02/07/19   Leonie Man, MD  cholestyramine Lucrezia Starch) 4 g packet Take 1 packet (4 g total) by mouth 2 (two) times daily. 07/15/19 10/13/19  Jonathon Bellows, MD  CINNAMON PO Take 1,200 mg by mouth daily.    [provider]  COLLAGEN PO Take 6,000 mg by mouth daily.    [provider]  fenofibrate 160 MG tablet Take 1 tablet (160 mg total) by mouth daily. 02/14/18   Leonie Man, MD  Homeopathic Products ([place]) FOAM Apply 1 application topically daily as needed (pain/cramps).    [provider]  hyoscyamine (LEVSIN) 0.125 MG tablet TAKE  1 TABLET BY MOUTH 4 TIMES A DAY AS NEEDED FOR PAIN 06/07/19   Jonathon Bellows, MD  imipramine (TOFRANIL) 25 MG tablet TAKE 1 TABLET BY MOUTH EVERYDAY AT BEDTIME 05/23/19   Sater, Nanine Means, MD  losartan (COZAAR) 100 MG tablet TAKE 1 TABLET BY MOUTH EVERY DAY. TAKE WITH HCTZ 07/20/19   [name]., MD  Magnesium 100 MG CAPS Take 200 mg by mouth daily.    [provider]  Menthol, Topical Analgesic, (BIOFREEZE EX) Apply 1 application topically daily as needed (pain).    [provider]  metFORMIN (GLUCOPHAGE) 1000 MG tablet TAKE 1 TABLET (1,000 MG TOTAL) BY MOUTH 2 (TWO) TIMES DAILY WITH A MEAL. 04/13/19   [name]., MD  metoprolol succinate (TOPROL-XL) 100 MG 24 hr tablet Take 1 tablet (100 mg total) by mouth daily. Take with or immediately following a meal. 01/07/19   Lendon Colonel, NP  metroNIDAZOLE (FLAGYL) 500 MG tablet Take 1 tablet (500 mg total) by mouth 2 (two)  times daily. Patient not taking: Reported on 06/04/2019 10/13/18   Cuthriell, Charline Bills, PA-C  naproxen sodium (ALEVE) 220 MG tablet Take 440 mg by mouth at bedtime as needed (pain).    [provider]  omeprazole (PRILOSEC OTC) 20 MG tablet Take 1 tablet (20 mg total) by mouth daily. 05/14/15   [name]., MD  ondansetron (ZOFRAN-ODT) 4 MG disintegrating tablet Take 1 tablet (4 mg total) by mouth every 8 (eight) hours as needed for nausea or vomiting. 10/13/18   Cuthriell, Charline Bills, PA-C  pioglitazone (ACTOS) 15 MG tablet Take 1 tablet (15 mg total) by mouth daily. 03/07/19   [name]., MD  rosuvastatin (CRESTOR) 20 MG tablet TAKE 1 TABLET BY MOUTH EVERY DAY 05/28/19   Leonie Man, MD  Saw Palmetto, Serenoa repens, (SAW PALMETTO PO) Take 500 mg by mouth daily.    [provider]  Simethicone (GAS-X EXTRA STRENGTH) 125 MG CAPS Take 125 mg by mouth daily as needed (gas).    [provider]  tadalafil (CIALIS) 20 MG tablet TAKE 0.5 TABLETS (10 MG  TOTAL) BY MOUTH DAILY AS NEEDED FOR ERECTILE DYSFUNCTION. Patient taking differently: Take 20 mg by mouth daily as needed for erectile dysfunction.  02/03/19   [name]., MD  VASCEPA 1 g capsule TAKE 2 CAPSULES (2 G TOTAL) BY MOUTH 2 (TWO) TIMES DAILY. Patient not taking: No sig reported 02/05/19   Leonie Man, MD  vitamin C (ASCORBIC ACID) 250 MG tablet Take 1,000 mg by mouth daily.    [provider]    Family History Family History  Problem Relation Age of Onset  . Hypertension Mother   . Irritable bowel syndrome Mother   . Migraines Mother   . Lumbar disc disease Mother   . Heart attack Mother   . Heart attack Maternal Uncle   . Heart attack Maternal Grandfather     Social History Social History   Tobacco Use  . Smoking status: Former Smoker    Packs/day: 1.00    Years: 28.00    Pack years: 28.00    Types: Cigarettes    Quit date: 12/30/2005    Years since quitting: 13.5  . Smokeless tobacco: Never Used  Substance Use Topics  . Alcohol use: Yes    Comment: Once or twice a year  . Drug use: Not Currently    Types: Marijuana    Comment: Quit in 1982     Allergies   Mucinex [guaifenesin er]   Review of Systems Review of Systems  Constitutional: Negative for chills and fever.  HENT: Negative for congestion, ear pain and sore throat.   Eyes: Negative for pain and visual disturbance.  Respiratory: Negative for cough and shortness of breath.   Cardiovascular: Negative for chest pain and palpitations.  Gastrointestinal: Negative for abdominal pain, diarrhea, nausea and vomiting.  Genitourinary: Negative for dysuria and hematuria.  Musculoskeletal: Negative for arthralgias and back pain.  Skin: Negative for color change and rash.  Neurological: Negative for seizures and syncope.  All other systems reviewed and are negative.    Physical Exam Triage Vital Signs ED Triage Vitals [07/26/19 0812]  Enc Vitals Group     BP      Pulse       Resp      Temp      Temp src      SpO2      Weight      Height  Head Circumference      Peak Flow      Pain Score 0     Pain Loc      Pain Edu?      Excl. in [place]?    No data found.  Updated Vital Signs BP (!) 137/96   Pulse 75   Temp 97.9 F (36.6 C)   Resp 16   SpO2 98%   Visual Acuity Right Eye Distance:   Left Eye Distance:   Bilateral Distance:    Right Eye Near:   Left Eye Near:    Bilateral Near:     Physical Exam Vitals and nursing note reviewed.  Constitutional:      General: He is not in acute distress.    Appearance: He is well-developed. He is not ill-appearing.  HENT:     Head: Normocephalic and atraumatic.     Right Ear: Tympanic membrane normal.     Left Ear: Tympanic membrane normal.     Nose: Nose normal.     Mouth/Throat:     Mouth: Mucous membranes are moist.     Pharynx: Oropharynx is clear.  Eyes:     Conjunctiva/sclera: Conjunctivae normal.  Cardiovascular:     Rate and Rhythm: Normal rate and regular rhythm.     Heart sounds: Normal heart sounds. No murmur heard.   Pulmonary:     Effort: Pulmonary effort is normal. No respiratory distress.     Breath sounds: Normal breath sounds. No wheezing or rhonchi.  Abdominal:     Palpations: Abdomen is soft.     Tenderness: There is no abdominal tenderness. There is no guarding or rebound.  Musculoskeletal:     Cervical back: Neck supple.  Skin:    General: Skin is warm and dry.     Findings: No rash.  Neurological:     General: No focal deficit present.     Mental Status: He is alert and oriented to person, place, and time.     Gait: Gait normal.  Psychiatric:        Mood and Affect: Mood normal.        Behavior: Behavior normal.      UC Treatments / Results  Labs (all labs ordered are listed, but only abnormal results are displayed) Labs Reviewed  NOVEL CORONAVIRUS, NAA    EKG   Radiology No results found.  Procedures Procedures (including critical care  time)  Medications Ordered in UC Medications - No data to display  Initial Impression / Assessment and Plan / UC Course  I have reviewed the triage vital signs and the nursing notes.  Pertinent labs & imaging results that were available during my care of the patient were reviewed by me and considered in my medical decision making (see chart for details).   Close exposure to COVID.  PCR COVID test performed here.  Instructed patient to self quarantine until the test result is back.  Discussed with patient that he can take Tylenol as needed for fever or discomfort.  Instructed patient to go to the emergency department if he develops high fever, shortness of breath, severe diarrhea, or other concerning symptoms.  Patient agrees with plan of care.     Final Clinical Impressions(s) / UC Diagnoses   Final diagnoses:  Close exposure to COVID-19 virus     Discharge Instructions     Your COVID test is pending.  You should self quarantine until the test result is back.    Take Tylenol as  needed for fever or discomfort.  Rest and keep yourself hydrated.    Go to the emergency department if you develop shortness of breath, severe diarrhea, high fever not relieved by Tylenol or ibuprofen, or other concerning symptoms.       ED Prescriptions    None     PDMP not reviewed this encounter.   Sharion Balloon, NP 07/26/19 [phone]

## 2019-07-27 LAB — SARS-COV-2, NAA 2 DAY TAT

## 2019-07-27 LAB — NOVEL CORONAVIRUS, NAA: SARS-CoV-2, NAA: NOT DETECTED

## 2019-08-15 ENCOUNTER — Ambulatory Visit: Payer: Commercial Managed Care - PPO | Admitting: Family Medicine

## 2019-08-29 ENCOUNTER — Other Ambulatory Visit: Payer: Self-pay | Admitting: Gastroenterology

## 2019-10-08 ENCOUNTER — Other Ambulatory Visit: Payer: Self-pay | Admitting: Family Medicine

## 2019-10-08 NOTE — Progress Notes (Signed)
Trena Platt Paris Hohn,acting as a scribe for [name], MD.,have documented all relevant documentation on the behalf of [name], MD,as directed by  [name], MD while in the presence of [name], MD.  Complete physical exam   Patient: Robert Franklin   DOB: 18-Jun-1965   54 y.o. Male  MRN: [id number] Visit Date: 10/10/2019  Today's healthcare provider: [name], MD   Chief Complaint  Patient presents with  . Annual Exam   Subjective    Robert Franklin is a 54 y.o. male who presents today for a complete physical exam.  He reports consuming a general diet. The patient does not participate in regular exercise at present. He generally feels well. He reports sleeping fairly well. He does not have additional problems to discuss today.   HPI  Patient stopped his CPAP as he lost weight.  His wife states that he no longer snores.  High Weight was 265 and today he is at 228.  Past Medical History:  Diagnosis Date  . Coronary artery disease, non-occlusive 02/28/2008   Dr. Humphrey Rolls @ [facility]: 2 x 40-50% lesions in dominant LCx lesion by cath,  EF 60% with normal wall motion.  . Diabetes mellitus without complication ([place])   . Essential hypertension   . GERD (gastroesophageal reflux disease)   . Headache   . High triglycerides     was previously on treatment, but  he notes that this was stopped  . Kidney stones   . Moderate obesity 05/29/2014  . OSA treated with BiPAP   . Short bowel syndrome 1994   Past Surgical History:  Procedure Laterality Date  . "stomach wrap"  1991   GERD  . APPENDECTOMY    . CARDIAC CATHETERIZATION  02/2008   Dr. Humphrey Rolls @ [facility]: 2 x 40-50% lesions in a dominant LCx by cath,  EF 60% with normal wall motion.  [name] Nuclear Stress Test   January 2010    heart rate increased from 80-158 BPM. No EKG changes.  there is a moderate sized  reversible defect in the inferior and inferior lateral wall as well as  anteroseptal. EF 59%.  . CardioNet Holter Monitor   January 2010    sinus rhythm. Rare PVCs in setting was. Current PACs , seen in singles and pairs. Max heart rate 127, minimum heart rate 57. Average 84.  . CHOLECYSTECTOMY    . COLON SURGERY     due to gangrenous appendix  . COLONOSCOPY WITH PROPOFOL N/A 04/26/2016   Procedure: COLONOSCOPY WITH PROPOFOL;  Surgeon: Jonathon Bellows, MD;  Location: [facility] ENDOSCOPY;  Service: Endoscopy;  Laterality: N/A;  . ESOPHAGOGASTRODUODENOSCOPY (EGD) WITH PROPOFOL N/A 01/11/2019   Procedure: ESOPHAGOGASTRODUODENOSCOPY (EGD) WITH PROPOFOL;  Surgeon: Jonathon Bellows, MD;  Location: [facility] ENDOSCOPY;  Service: Gastroenterology;  Laterality: N/A;  . FLEXIBLE SIGMOIDOSCOPY N/A 01/11/2019   Procedure: FLEXIBLE SIGMOIDOSCOPY;  Surgeon: Jonathon Bellows, MD;  Location: [facility] ENDOSCOPY;  Service: Gastroenterology;  Laterality: N/A;  . HERNIA REPAIR  2010  . INNER EAR SURGERY    . LEFT HEART CATH AND CORONARY ANGIOGRAPHY N/A 02/12/2019   Procedure: LEFT HEART CATH AND CORONARY ANGIOGRAPHY;  Surgeon: Leonie Man, MD;  Location: [place] [facility];  Service: Cardiovascular;  Laterality: N/A;  . NM MYOVIEW LTD  01/10/2019   Treadmill Myoview: Walk 9:40 min-10.1 METS) EF ~45 to 50%.  INTERMEDIATE RISK STUDY-moderate sized mostly fixed perfusion defect in anterior apical segment.  No reversibility.  Suggests prior infarct without ischemia.  . TONSILLECTOMY    . TYMPANOSTOMY TUBE PLACEMENT Bilateral 1973   Social History   Socioeconomic History  . Marital status: Married    Spouse name: Collie Siad  . Number of children: 0  . Years of education: 10  . Highest education level: Not on file  Occupational History  . Occupation: [employer]  Tobacco Use  . Smoking status: Former Smoker    Packs/day: 1.00    Years: 28.00    Pack years: 28.00    Types: Cigarettes    Quit date: 12/30/2005    Years since quitting: 13.7  . Smokeless tobacco: Never Used  Substance and Sexual Activity  .  Alcohol use: Yes    Comment: Once or twice a year  . Drug use: Not Currently    Types: Marijuana    Comment: Quit in 1982  . Sexual activity: Not Currently  Other Topics Concern  . 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   Caffeine use: once or twice a week   Lives with wife, mother and grandmother   Social Determinants of Health   Financial Resource Strain:   . Difficulty of Paying Living Expenses: Not on file  Food Insecurity:   . Worried About Charity fundraiser in the Last Year: Not on file  . Ran Out of Food in the Last Year: Not on file  Transportation Needs:   . Lack of Transportation (Medical): Not on file  . Lack of Transportation (Non-Medical): Not on file  Physical Activity:   . Days of Exercise per Week: Not on file  . Minutes of Exercise per Session: Not on file  Stress:   . Feeling of Stress : Not on file  Social Connections:   . Frequency of Communication with Friends and Family: Not on file  . Frequency of Social Gatherings with Friends and Family: Not on file  . Attends Religious Services: Not on file  . Active Member of Clubs or Organizations: Not on file  . Attends Archivist Meetings: Not on file  . Marital Status: Not on file  Intimate Partner Violence:   . Fear of Current or Ex-Partner: Not on file  . Emotionally Abused: Not on file  . Physically Abused: Not on file  . Sexually Abused: Not on file   Family Status  Relation Name Status  . Mother  Alive  . Father  Alive  . Brother  Alive  . Brother  Alive  . Mat Nordstrom  . MGF  Deceased  . Mat Uncle  Alive   Family History  Problem Relation Age of Onset  . Hypertension Mother   . Irritable bowel syndrome Mother   . Migraines Mother   . Lumbar disc disease Mother   . Heart attack Mother   . Heart attack Maternal Uncle   . Heart attack Maternal Grandfather     Allergies  Allergen Reactions  . Mucinex [Guaifenesin Er] Other (See Comments)    Body tingles all over    Patient Care Team: [name]., MD as PCP - General (Family Medicine) Leonie Man, MD as PCP - Cardiology (Cardiology)   Medications: Outpatient Medications Prior to Visit  Medication Sig  . acetaminophen (TYLENOL) 500 MG tablet Take 1,000 mg by mouth every 8 (eight) hours  as needed for moderate pain or headache.  Marland Kitchen aspirin EC 81 MG tablet Take 81 mg by mouth daily.  Marland Kitchen bismuth subsalicylate (PEPTO BISMOL) 262 MG/15ML suspension Take 30 mLs by mouth every 6 (six) hours as needed for indigestion.  . butalbital-acetaminophen-caffeine (FIORICET) 50-325-40 MG tablet TAKE 1 TABLET BY MOUTH 2 (TWO) TIMES DAILY AS NEEDED FOR HEADACHE.  . chlorthalidone (HYGROTON) 50 MG tablet Take 0.5 tablets (25 mg total) by mouth daily.  . cholestyramine (QUESTRAN) 4 g packet Take 1 packet (4 g total) by mouth 2 (two) times daily.  Marland Kitchen CINNAMON PO Take 1,200 mg by mouth daily.  . COLLAGEN PO Take 6,000 mg by mouth daily.  . fenofibrate 160 MG tablet Take 1 tablet (160 mg total) by mouth daily.  . Homeopathic Products (THERAWORX RELIEF) FOAM Apply 1 application topically daily as needed (pain/cramps).  . hyoscyamine (LEVSIN) 0.125 MG tablet TAKE 1 TABLET BY MOUTH 4 TIMES A DAY AS NEEDED FOR PAIN  . imipramine (TOFRANIL) 25 MG tablet TAKE 1 TABLET BY MOUTH EVERYDAY AT BEDTIME  . losartan (COZAAR) 100 MG tablet TAKE 1 TABLET BY MOUTH EVERY DAY. TAKE WITH HCTZ  . Magnesium 100 MG CAPS Take 200 mg by mouth daily.  . Menthol, Topical Analgesic, (BIOFREEZE EX) Apply 1 application topically daily as needed (pain).  . metFORMIN (GLUCOPHAGE) 1000 MG tablet TAKE 1 TABLET (1,000 MG TOTAL) BY MOUTH 2 (TWO) TIMES DAILY WITH A MEAL.  . metoprolol succinate (TOPROL-XL) 100 MG 24 hr tablet Take 1 tablet (100 mg total) by mouth daily. Take with or immediately following a meal.  . metroNIDAZOLE (FLAGYL)  500 MG tablet Take 1 tablet (500 mg total) by mouth 2 (two) times daily.  . naproxen sodium (ALEVE) 220 MG tablet Take 440 mg by mouth at bedtime as needed (pain).  Marland Kitchen omeprazole (PRILOSEC OTC) 20 MG tablet Take 1 tablet (20 mg total) by mouth daily.  . ondansetron (ZOFRAN-ODT) 4 MG disintegrating tablet Take 1 tablet (4 mg total) by mouth every 8 (eight) hours as needed for nausea or vomiting.  . pioglitazone (ACTOS) 15 MG tablet Take 1 tablet (15 mg total) by mouth daily.  . rosuvastatin (CRESTOR) 20 MG tablet TAKE 1 TABLET BY MOUTH EVERY DAY  . Saw Palmetto, Serenoa repens, (SAW PALMETTO PO) Take 500 mg by mouth daily.  . Simethicone (GAS-X EXTRA STRENGTH) 125 MG CAPS Take 125 mg by mouth daily as needed (gas).  . tadalafil (CIALIS) 20 MG tablet TAKE 0.5 TABLETS (10 MG TOTAL) BY MOUTH DAILY AS NEEDED FOR ERECTILE DYSFUNCTION. (Patient taking differently: Take 20 mg by mouth daily as needed for erectile dysfunction. )  . VASCEPA 1 g capsule TAKE 2 CAPSULES (2 G TOTAL) BY MOUTH 2 (TWO) TIMES DAILY.  . vitamin C (ASCORBIC ACID) 250 MG tablet Take 1,000 mg by mouth daily.   No facility-administered medications prior to visit.    Review of Systems  Constitutional: Negative.   HENT: Negative.   Eyes: Negative.   Respiratory: Negative.   Cardiovascular: Negative.   Gastrointestinal: Negative.   Endocrine: Negative.   Genitourinary: Negative.   Musculoskeletal: Negative.   Allergic/Immunologic: Negative.   Neurological: Negative.   Hematological: Negative.   Psychiatric/Behavioral: Negative.        Objective    BP 127/82 (BP Location: Right Arm, Patient Position: Sitting, Cuff Size: Large)   Pulse 71   Temp 98.1 F (36.7 C) (Oral)   Ht 5\' 9"  (1.753 m)   Wt 227 lb 9.6  oz (103.2 kg)   BMI 33.61 kg/m  BP Readings from Last 3 Encounters:  10/10/19 127/82  07/26/19 (!) 137/96  06/04/19 124/85   Wt Readings from Last 3 Encounters:  10/10/19 227 lb 9.6 oz (103.2 kg)  06/04/19 225  lb (102.1 kg)  04/30/19 231 lb 6.4 oz (105 kg)      Physical Exam Vitals reviewed.  Constitutional:      Appearance: He is well-developed. He is obese.  HENT:     Head: Normocephalic and atraumatic.     Right Ear: Tympanic membrane and external ear normal.     Left Ear: Tympanic membrane and external ear normal.     Nose: Nose normal.  Eyes:     Conjunctiva/sclera: Conjunctivae normal.     Pupils: Pupils are equal, round, and reactive to light.  Cardiovascular:     Rate and Rhythm: Regular rhythm.     Heart sounds: Normal heart sounds.  Pulmonary:     Effort: Pulmonary effort is normal.     Breath sounds: Normal breath sounds.  Abdominal:     General: Bowel sounds are normal.     Palpations: Abdomen is soft.     Comments: Small umbilical hernia.  Musculoskeletal:        General: Normal range of motion.     Cervical back: Normal range of motion and neck supple.  Skin:    General: Skin is warm and dry.  Neurological:     General: No focal deficit present.     Mental Status: He is alert and oriented to person, place, and time.  Psychiatric:        Behavior: Behavior normal.        Thought Content: Thought content normal.        Judgment: Judgment normal.    BP 127/82 (BP Location: Right Arm, Patient Position: Sitting, Cuff Size: Large)   Pulse 71   Temp 98.1 F (36.7 C) (Oral)   Ht 5\' 9"  (1.753 m)   Wt 227 lb 9.6 oz (103.2 kg)   BMI 33.61 kg/m   General Appearance:    Alert, cooperative, no distress, appears stated age  Head:    Normocephalic, without obvious abnormality, atraumatic  Eyes:    PERRL, conjunctiva/corneas clear, EOM's intact, fundi    benign, both eyes       Ears:    Normal TM's and external ear canals, both ears  Nose:   Nares normal, septum midline, mucosa normal, no drainage   or sinus tenderness  Throat:   Lips, mucosa, and tongue normal; teeth and gums normal  Neck:   Supple, symmetrical, trachea midline, no adenopathy;       thyroid:  No  enlargement/tenderness/nodules; no carotid   bruit or JVD  Back:     Symmetric, no curvature, ROM normal, no CVA tenderness  Lungs:     Clear to auscultation bilaterally, respirations unlabored  Chest wall:    No tenderness or deformity  Heart:    Regular rate and rhythm, S1 and S2 normal, no murmur, rub   or gallop  Abdomen:     Soft, non-tender, bowel sounds active all four quadrants,    no masses, no organomegaly  Genitalia:    Normal male without lesion, discharge or tenderness  Rectal:    Normal tone, normal prostate, no masses or tenderness;   guaiac negative stool  Extremities:   Extremities normal, atraumatic, no cyanosis or edema  Pulses:   2+ and symmetric all extremities  Skin:   Skin color, texture, turgor normal, no rashes or lesions  Lymph nodes:   Cervical, supraclavicular, and axillary nodes normal  Neurologic:   CNII-XII intact. Normal strength, sensation and reflexes      throughout   Last depression screening scores PHQ 2/9 Scores 07/05/2018 02/26/2018 06/07/2017  PHQ - 2 Score 0 0 0  PHQ- 9 Score 0 - 0   Last fall risk screening Fall Risk  07/05/2018  Falls in the past year? 0  Number falls in past yr: 0  Injury with Fall? 0   Last Audit-C alcohol use screening Alcohol Use Disorder Test (AUDIT) 07/05/2018  1. How often do you have a drink containing alcohol? 0  2. How many drinks containing alcohol do you have on a typical day when you are drinking? 0  3. How often do you have six or more drinks on one occasion? 0  AUDIT-C Score 0   A score of 3 or more in women, and 4 or more in men indicates increased risk for alcohol abuse, EXCEPT if all of the points are from question 1   No results found for any visits on 10/10/19.  Assessment & Plan    Routine Health Maintenance and Physical Exam  Exercise Activities and Dietary recommendations Goals   None     Immunization History  Administered Date(s) Administered  . Tdap 01/28/2010    Health Maintenance    Topic Date Due  . Hepatitis C Screening  Never done  . PNEUMOCOCCAL POLYSACCHARIDE VACCINE AGE 73-64 HIGH RISK  Never done  . FOOT EXAM  Never done  . OPHTHALMOLOGY EXAM  Never done  . COVID-19 Vaccine (1) Never done  . HIV Screening  Never done  . INFLUENZA VACCINE  10/09/2020 (Originally 08/25/2019)  . HEMOGLOBIN A1C  12/05/2019  . TETANUS/TDAP  01/29/2020  . COLONOSCOPY  04/26/2021    Discussed health benefits of physical activity, and encouraged him to engage in regular exercise appropriate for his age and condition. 1. Annual physical exam Colonoscopy done in December 2020.  2. Type 2 diabetes mellitus without complication, without long-term current use of insulin (HCC)  - CBC with Differential/Platelet - Comprehensive metabolic panel - TSH - Lipid panel - Hemoglobin A1c - POCT UA - Microalbumin  3. Essential hypertension  - CBC with Differential/Platelet - Comprehensive metabolic panel - TSH - Lipid panel - Hemoglobin A1c  4. Hypertriglyceridemia without hypercholesterolemia  - CBC with Differential/Platelet - Comprehensive metabolic panel - TSH - Lipid panel - Hemoglobin A1c  5. Screening for prostate cancer  - PSA    No follow-ups on file.     I, [name], MD, have reviewed all documentation for this visit. The documentation on 10/15/19 for the exam, diagnosis, procedures, and orders are all accurate and complete.    Richard Cranford Mon, MD  [facility] [phone] (phone) [phone] (fax)  [place]

## 2019-10-08 NOTE — Telephone Encounter (Signed)
Requested Prescriptions  Pending Prescriptions Disp Refills  . metFORMIN (GLUCOPHAGE) 1000 MG tablet [Pharmacy Med Name: METFORMIN HCL 1,000 MG TABLET] 180 tablet 0    Sig: TAKE 1 TABLET (1,000 MG TOTAL) BY MOUTH 2 (TWO) TIMES DAILY WITH A MEAL.     Endocrinology:  Diabetes - Biguanides Passed - 10/08/2019  1:23 AM      Passed - Cr in normal range and within 360 days    Creatinine  Date Value Ref Range Status  01/29/2013 1.01 0.60 - 1.30 mg/dL Final   Creatinine, Ser  Date Value Ref Range Status  02/07/2019 1.04 0.76 - 1.27 mg/dL Final         Passed - HBA1C is between 0 and 7.9 and within 180 days    Hemoglobin A1C  Date Value Ref Range Status  06/04/2019 6.3 (A) 4.0 - 5.6 % Final   Hgb A1c MFr Bld  Date Value Ref Range Status  06/07/2017 8.9 (H) 4.8 - 5.6 % Final    Comment:             Prediabetes: 5.7 - 6.4          Diabetes: >6.4          Glycemic control for adults with diabetes: <7.0          Passed - eGFR in normal range and within 360 days    EGFR (African American)  Date Value Ref Range Status  01/29/2013 >60  Final   GFR calc Af Amer  Date Value Ref Range Status  02/07/2019 95 >59 mL/min/1.73 Final   EGFR (Non-African Amer.)  Date Value Ref Range Status  01/29/2013 >60  Final    Comment:    eGFR values <66mL/min/1.73 m2 may be an indication of chronic kidney disease (CKD). Calculated eGFR is useful in patients with stable renal function. The eGFR calculation will not be reliable in acutely ill patients when serum creatinine is changing rapidly. It is not useful in  patients on dialysis. The eGFR calculation may not be applicable to patients at the low and high extremes of body sizes, pregnant women, and vegetarians.    GFR calc non Af Amer  Date Value Ref Range Status  02/07/2019 82 >59 mL/min/1.73 Final         Passed - Valid encounter within last 6 months    Recent Outpatient Visits          4 months ago Type 2 diabetes mellitus without  complication, without long-term current use of insulin [facility])   [facility] [name]., MD   7 months ago Type 2 diabetes mellitus without complication, without long-term current use of insulin [facility])   [facility] [name]., MD   11 months ago Type 2 diabetes mellitus without complication, without long-term current use of insulin [facility])   [facility] [name]., MD   1 year ago Type 2 diabetes mellitus without complication, without long-term current use of insulin [facility])   [facility] [name]., MD   1 year ago Annual physical exam   [facility] [name]., MD      Future Appointments            In 2 days [name]., MD [facility] [place]

## 2019-10-10 ENCOUNTER — Other Ambulatory Visit: Payer: Self-pay

## 2019-10-10 ENCOUNTER — Encounter: Payer: Self-pay | Admitting: Family Medicine

## 2019-10-10 ENCOUNTER — Ambulatory Visit (INDEPENDENT_AMBULATORY_CARE_PROVIDER_SITE_OTHER): Payer: Commercial Managed Care - PPO | Admitting: Family Medicine

## 2019-10-10 VITALS — BP 127/82 | HR 71 | Temp 98.1°F | Ht 69.0 in | Wt 227.6 lb

## 2019-10-10 DIAGNOSIS — E781 Pure hyperglyceridemia: Secondary | ICD-10-CM | POA: Diagnosis not present

## 2019-10-10 DIAGNOSIS — Z0000 Encounter for general adult medical examination without abnormal findings: Secondary | ICD-10-CM | POA: Diagnosis not present

## 2019-10-10 DIAGNOSIS — E119 Type 2 diabetes mellitus without complications: Secondary | ICD-10-CM | POA: Diagnosis not present

## 2019-10-10 DIAGNOSIS — I10 Essential (primary) hypertension: Secondary | ICD-10-CM | POA: Diagnosis not present

## 2019-10-10 DIAGNOSIS — Z125 Encounter for screening for malignant neoplasm of prostate: Secondary | ICD-10-CM

## 2019-10-11 ENCOUNTER — Telehealth: Payer: Self-pay

## 2019-10-11 LAB — CBC WITH DIFFERENTIAL/PLATELET
Basophils Absolute: 0.1 10*3/uL (ref 0.0–0.2)
Basos: 1 %
EOS (ABSOLUTE): 0.2 10*3/uL (ref 0.0–0.4)
Eos: 2 %
Hematocrit: 42.3 % (ref 37.5–51.0)
Hemoglobin: 15 g/dL (ref 13.0–17.7)
Immature Grans (Abs): 0 10*3/uL (ref 0.0–0.1)
Immature Granulocytes: 1 %
Lymphocytes Absolute: 2.3 10*3/uL (ref 0.7–3.1)
Lymphs: 28 %
MCH: 30.7 pg (ref 26.6–33.0)
MCHC: 35.5 g/dL (ref 31.5–35.7)
MCV: 87 fL (ref 79–97)
Monocytes Absolute: 0.6 10*3/uL (ref 0.1–0.9)
Monocytes: 7 %
Neutrophils Absolute: 5.1 10*3/uL (ref 1.4–7.0)
Neutrophils: 61 %
Platelets: 220 10*3/uL (ref 150–450)
RBC: 4.88 x10E6/uL (ref 4.14–5.80)
RDW: 12.4 % (ref 11.6–15.4)
WBC: 8.2 10*3/uL (ref 3.4–10.8)

## 2019-10-11 LAB — HEMOGLOBIN A1C
Est. average glucose Bld gHb Est-mCnc: 160 mg/dL
Hgb A1c MFr Bld: 7.2 % — ABNORMAL HIGH (ref 4.8–5.6)

## 2019-10-11 LAB — COMPREHENSIVE METABOLIC PANEL
ALT: 40 IU/L (ref 0–44)
AST: 39 IU/L (ref 0–40)
Albumin/Globulin Ratio: 1.7 (ref 1.2–2.2)
Albumin: 4.8 g/dL (ref 3.8–4.9)
Alkaline Phosphatase: 81 IU/L (ref 44–121)
BUN/Creatinine Ratio: 17 (ref 9–20)
BUN: 19 mg/dL (ref 6–24)
Bilirubin Total: 0.7 mg/dL (ref 0.0–1.2)
CO2: 26 mmol/L (ref 20–29)
Calcium: 9.9 mg/dL (ref 8.7–10.2)
Chloride: 98 mmol/L (ref 96–106)
Creatinine, Ser: 1.15 mg/dL (ref 0.76–1.27)
GFR calc Af Amer: 84 mL/min/{1.73_m2} (ref >59)
GFR calc non Af Amer: 72 mL/min/{1.73_m2} (ref >59)
Globulin, Total: 2.8 g/dL (ref 1.5–4.5)
Glucose: 146 mg/dL — ABNORMAL HIGH (ref 65–99)
Potassium: 4.2 mmol/L (ref 3.5–5.2)
Sodium: 138 mmol/L (ref 134–144)
Total Protein: 7.6 g/dL (ref 6.0–8.5)

## 2019-10-11 LAB — LIPID PANEL
Chol/HDL Ratio: 5 ratio (ref 0.0–5.0)
Cholesterol, Total: 151 mg/dL (ref 100–199)
HDL: 30 mg/dL — ABNORMAL LOW (ref >39)
LDL Chol Calc (NIH): 60 mg/dL (ref 0–99)
Triglycerides: 395 mg/dL — ABNORMAL HIGH (ref 0–149)
VLDL Cholesterol Cal: 61 mg/dL — ABNORMAL HIGH (ref 5–40)

## 2019-10-11 LAB — TSH: TSH: 1.4 u[IU]/mL (ref 0.450–4.500)

## 2019-10-11 LAB — PSA: Prostate Specific Ag, Serum: 0.2 ng/mL (ref 0.0–4.0)

## 2019-10-11 NOTE — Telephone Encounter (Signed)
-----   Message from [name]., MD sent at 10/11/2019 10:11 AM EDT ----- Labs stable but diabetes slightly higher.  Patient can work on diet and exercise and/or we can add pioglitazone 15 mg daily

## 2019-10-11 NOTE — Telephone Encounter (Signed)
Patient advised of lab results via mychart and patient is going to try to work on diet and exercise.

## 2019-10-12 ENCOUNTER — Other Ambulatory Visit: Payer: Self-pay | Admitting: Gastroenterology

## 2019-11-12 ENCOUNTER — Other Ambulatory Visit: Payer: Self-pay

## 2019-11-12 ENCOUNTER — Encounter (INDEPENDENT_AMBULATORY_CARE_PROVIDER_SITE_OTHER): Payer: Self-pay | Admitting: Otolaryngology

## 2019-11-12 ENCOUNTER — Ambulatory Visit (INDEPENDENT_AMBULATORY_CARE_PROVIDER_SITE_OTHER): Payer: Commercial Managed Care - PPO | Admitting: Otolaryngology

## 2019-11-12 VITALS — Temp 93.9°F

## 2019-11-12 DIAGNOSIS — H6123 Impacted cerumen, bilateral: Secondary | ICD-10-CM

## 2019-11-12 DIAGNOSIS — H6122 Impacted cerumen, left ear: Secondary | ICD-10-CM

## 2019-11-12 DIAGNOSIS — H60312 Diffuse otitis externa, left ear: Secondary | ICD-10-CM

## 2019-11-12 NOTE — Progress Notes (Signed)
HPI: Robert Franklin is a 54 y.o. male who presents for evaluation of ear blockage and ear pain in the left ear. He apparently got some water trapped in his ear several weeks ago and then started causing ear pain. He was prescribed Ciprodex drops. The right ear started to hurt but since using the Ciprodex drops the right ear is doing much better however the left ear is still blocked in has moderate discomfort.  Past Medical History:  Diagnosis Date  . Coronary artery disease, non-occlusive 02/28/2008   Dr. Humphrey Rolls @ [facility]: 2 x 40-50% lesions in dominant LCx lesion by cath,  EF 60% with normal wall motion.  . Diabetes mellitus without complication ([place])   . Essential hypertension   . GERD (gastroesophageal reflux disease)   . Headache   . High triglycerides     was previously on treatment, but  he notes that this was stopped  . Kidney stones   . Moderate obesity 05/29/2014  . OSA treated with BiPAP   . Short bowel syndrome 1994   Past Surgical History:  Procedure Laterality Date  . "stomach wrap"  1991   GERD  . APPENDECTOMY    . CARDIAC CATHETERIZATION  02/2008   Dr. Humphrey Rolls @ [facility]: 2 x 40-50% lesions in a dominant LCx by cath,  EF 60% with normal wall motion.  [name] Nuclear Stress Test   January 2010    heart rate increased from 80-158 BPM. No EKG changes.  there is a moderate sized  reversible defect in the inferior and inferior lateral wall as well as anteroseptal. EF 59%.  . CardioNet Holter Monitor   January 2010    sinus rhythm. Rare PVCs in setting was. Current PACs , seen in singles and pairs. Max heart rate 127, minimum heart rate 57. Average 84.  . CHOLECYSTECTOMY    . COLON SURGERY     due to gangrenous appendix  . COLONOSCOPY WITH PROPOFOL N/A 04/26/2016   Procedure: COLONOSCOPY WITH PROPOFOL;  Surgeon: Jonathon Bellows, MD;  Location: [facility] ENDOSCOPY;  Service: Endoscopy;  Laterality: N/A;  . ESOPHAGOGASTRODUODENOSCOPY (EGD) WITH PROPOFOL N/A 01/11/2019   Procedure:  ESOPHAGOGASTRODUODENOSCOPY (EGD) WITH PROPOFOL;  Surgeon: Jonathon Bellows, MD;  Location: [facility] ENDOSCOPY;  Service: Gastroenterology;  Laterality: N/A;  . FLEXIBLE SIGMOIDOSCOPY N/A 01/11/2019   Procedure: FLEXIBLE SIGMOIDOSCOPY;  Surgeon: Jonathon Bellows, MD;  Location: [facility] ENDOSCOPY;  Service: Gastroenterology;  Laterality: N/A;  . HERNIA REPAIR  2010  . INNER EAR SURGERY    . LEFT HEART CATH AND CORONARY ANGIOGRAPHY N/A 02/12/2019   Procedure: LEFT HEART CATH AND CORONARY ANGIOGRAPHY;  Surgeon: Leonie Man, MD;  Location: [place] CV LAB;  Service: Cardiovascular;  Laterality: N/A;  . NM MYOVIEW LTD  01/10/2019   Treadmill Myoview: Walk 9:40 min-10.1 METS) EF ~45 to 50%.  INTERMEDIATE RISK STUDY-moderate sized mostly fixed perfusion defect in anterior apical segment.  No reversibility.  Suggests prior infarct without ischemia.  . TONSILLECTOMY    . TYMPANOSTOMY TUBE PLACEMENT Bilateral 1973   Social History   Socioeconomic History  . Marital status: Married    Spouse name: Collie Siad  . Number of children: 0  . Years of education: 6  . Highest education level: Not on file  Occupational History  . Occupation: [employer]  Tobacco Use  . Smoking status: Former Smoker    Packs/day: 1.00    Years: 28.00    Pack years: 28.00    Types: Cigarettes    Quit date:  12/30/2005    Years since quitting: 13.8  . Smokeless tobacco: Never Used  Substance and Sexual Activity  . Alcohol use: Yes    Comment: Once or twice a year  . Drug use: Not Currently    Types: Marijuana    Comment: Quit in 1982  . Sexual activity: Not Currently  Other Topics Concern  . 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   Caffeine use: once or twice a week   Lives with wife, mother and grandmother   Social Determinants of Health   Financial Resource Strain:   . Difficulty of Paying Living Expenses:  Not on file  Food Insecurity:   . Worried About Charity fundraiser in the Last Year: Not on file  . Ran Out of Food in the Last Year: Not on file  Transportation Needs:   . Lack of Transportation (Medical): Not on file  . Lack of Transportation (Non-Medical): Not on file  Physical Activity:   . Days of Exercise per Week: Not on file  . Minutes of Exercise per Session: Not on file  Stress:   . Feeling of Stress : Not on file  Social Connections:   . Frequency of Communication with Friends and Family: Not on file  . Frequency of Social Gatherings with Friends and Family: Not on file  . Attends Religious Services: Not on file  . Active Member of Clubs or Organizations: Not on file  . Attends Archivist Meetings: Not on file  . Marital Status: Not on file   Family History  Problem Relation Age of Onset  . Hypertension Mother   . Irritable bowel syndrome Mother   . Migraines Mother   . Lumbar disc disease Mother   . Heart attack Mother   . Heart attack Maternal Uncle   . Heart attack Maternal Grandfather    Allergies  Allergen Reactions  . Mucinex [Guaifenesin Er] Other (See Comments)    Body tingles all over   Prior to Admission medications   Medication Sig Start Date End Date Taking? Authorizing Provider  acetaminophen (TYLENOL) 500 MG tablet Take 1,000 mg by mouth every 8 (eight) hours as needed for moderate pain or headache.   Yes [provider]  aspirin EC 81 MG tablet Take 81 mg by mouth daily.   Yes [provider]  bismuth subsalicylate (PEPTO BISMOL) 262 MG/15ML suspension Take 30 mLs by mouth every 6 (six) hours as needed for indigestion.   Yes [provider]  butalbital-acetaminophen-caffeine (FIORICET) 50-325-40 MG tablet TAKE 1 TABLET BY MOUTH 2 (TWO) TIMES DAILY AS NEEDED FOR HEADACHE. 09/06/18  Yes [name]., MD  chlorthalidone (HYGROTON) 50 MG tablet Take 0.5 tablets (25 mg total) by mouth daily. 02/07/19  Yes  Leonie Man, MD  cholestyramine Lucrezia Starch) 4 g packet TAKE 1 PACKET (4 G TOTAL) BY MOUTH 2 (TWO) TIMES DAILY. 10/14/19 01/12/20 Yes Jonathon Bellows, MD  CINNAMON PO Take 1,200 mg by mouth daily.   Yes [provider]  COLLAGEN PO Take 6,000 mg by mouth daily.   Yes [provider]  fenofibrate 160 MG tablet Take 1 tablet (160 mg total) by mouth daily. 02/14/18  Yes Leonie Man, MD  Homeopathic Products ([place]) FOAM Apply 1 application topically daily as needed (pain/cramps).   Yes [provider]  hyoscyamine (LEVSIN) 0.125 MG tablet TAKE 1 TABLET BY MOUTH 4 TIMES A DAY AS NEEDED FOR PAIN 08/29/19  Yes Jonathon Bellows, MD  imipramine (TOFRANIL) 25 MG tablet TAKE 1 TABLET BY MOUTH EVERYDAY AT BEDTIME 05/23/19  Yes Sater, Nanine Means, MD  losartan (COZAAR) 100 MG tablet TAKE 1 TABLET BY MOUTH EVERY DAY. TAKE WITH HCTZ 07/20/19  Yes [name]., MD  Magnesium 100 MG CAPS Take 200 mg by mouth daily.   Yes [provider]  Menthol, Topical Analgesic, (BIOFREEZE EX) Apply 1 application topically daily as needed (pain).   Yes [provider]  metFORMIN (GLUCOPHAGE) 1000 MG tablet TAKE 1 TABLET (1,000 MG TOTAL) BY MOUTH 2 (TWO) TIMES DAILY WITH A MEAL. 10/08/19  Yes [name]., MD  metoprolol succinate (TOPROL-XL) 100 MG 24 hr tablet Take 1 tablet (100 mg total) by mouth daily. Take with or immediately following a meal. 01/07/19  Yes Lendon Colonel, NP  metroNIDAZOLE (FLAGYL) 500 MG tablet Take 1 tablet (500 mg total) by mouth 2 (two) times daily. 10/13/18  Yes Cuthriell, Charline Bills, PA-C  naproxen sodium (ALEVE) 220 MG tablet Take 440 mg by mouth at bedtime as needed (pain).   Yes [provider]  omeprazole (PRILOSEC OTC) 20 MG tablet Take 1 tablet (20 mg total) by mouth daily. 05/14/15  Yes [name]., MD  ondansetron (ZOFRAN-ODT) 4 MG disintegrating tablet Take 1 tablet (4 mg total) by mouth every 8 (eight)  hours as needed for nausea or vomiting. 10/13/18  Yes Cuthriell, Charline Bills, PA-C  pioglitazone (ACTOS) 15 MG tablet Take 1 tablet (15 mg total) by mouth daily. 03/07/19  Yes [name]., MD  rosuvastatin (CRESTOR) 20 MG tablet TAKE 1 TABLET BY MOUTH EVERY DAY 05/28/19  Yes Leonie Man, MD  Saw Palmetto, Serenoa repens, (SAW PALMETTO PO) Take 500 mg by mouth daily.   Yes [provider]  Simethicone (GAS-X EXTRA STRENGTH) 125 MG CAPS Take 125 mg by mouth daily as needed (gas).   Yes [provider]  tadalafil (CIALIS) 20 MG tablet TAKE 0.5 TABLETS (10 MG TOTAL) BY MOUTH DAILY AS NEEDED FOR ERECTILE DYSFUNCTION. Patient taking differently: Take 20 mg by mouth daily as needed for erectile dysfunction.  02/03/19  Yes [name]., MD  VASCEPA 1 g capsule TAKE 2 CAPSULES (2 G TOTAL) BY MOUTH 2 (TWO) TIMES DAILY. 02/05/19  Yes Leonie Man, MD  vitamin C (ASCORBIC ACID) 250 MG tablet Take 1,000 mg by mouth daily.   Yes [provider]     Positive ROS: Otherwise negative  All other systems have been reviewed and were otherwise negative with the exception of those mentioned in the HPI and as above.  Physical Exam: Constitutional: Alert, well-appearing, no acute distress Ears: External ears without lesions or tenderness. He has obstruction of left ear canal with debris as well as some inflammatory changes. The ear canal was cleaned with hydroperoxide and suction. He has some granulation tissue on the TM consistent with external otitis. After cleaning the ear canal I applied gentian violet Ciprodex and CSF powder to the left ear. On examination the right ear he had minimal debris that was cleaned with suction. Applied a little bit of gentian violet and CSF powder to the right ear but really no substantial obstruction or external otitis on the right side. Nasal: External nose without lesions.. Clear nasal passages Oral: Lips and gums without  lesions.  Tongue and palate mucosa without lesions. Posterior oropharynx clear. Neck: No palpable adenopathy or masses Respiratory: Breathing comfortably  Skin: No facial/neck lesions or rash noted.  Cerumen impaction removal  Date/Time: 11/12/2019 3:23 PM Performed by: Rozetta Nunnery, MD Authorized by: Rozetta Nunnery, MD   Consent:    Consent obtained:  Verbal   Consent given by:  Patient   Risks discussed:  Pain and bleeding Procedure details:    Location:  L ear and R ear   Procedure type: suction   Post-procedure details:    Inspection:  TM intact   Hearing quality:  Improved   Patient tolerance of procedure:  Tolerated well, no immediate complications Comments:     Left ear canal with external otitis with some mild granulation tissue on the TM. After cleaning the left ear canal I applied gentian violet, Ciprodex and CSF powder to the left ear. Right ear canal was relatively clear with a clear right TM and I applied CSF powder to the right ear.    Assessment: Left external otitis with cerumen buildup on the left side that was cleaned in the office.  Plan: Recommended keeping the left ear dry for the next 48 hours and use the antibiotic eardrops he has which is Ciprodex 4 to 5 drops twice daily for 5 days if he has any further drainage or pain from the ear. He will follow-up as needed  Radene Journey, MD

## 2019-11-19 ENCOUNTER — Other Ambulatory Visit: Payer: Self-pay | Admitting: Neurology

## 2020-01-03 ENCOUNTER — Other Ambulatory Visit: Payer: Self-pay | Admitting: Family Medicine

## 2020-01-05 ENCOUNTER — Other Ambulatory Visit: Payer: Self-pay | Admitting: Family Medicine

## 2020-01-14 ENCOUNTER — Other Ambulatory Visit: Payer: Self-pay | Admitting: Adult Health

## 2020-01-30 ENCOUNTER — Other Ambulatory Visit: Payer: Self-pay | Admitting: Family Medicine

## 2020-01-30 DIAGNOSIS — N529 Male erectile dysfunction, unspecified: Secondary | ICD-10-CM

## 2020-02-11 ENCOUNTER — Ambulatory Visit
Admission: EM | Admit: 2020-02-11 | Discharge: 2020-02-11 | Disposition: A | Discharge status: Home / Self Care | Payer: Commercial Managed Care - PPO

## 2020-02-11 ENCOUNTER — Other Ambulatory Visit: Payer: Self-pay

## 2020-02-11 ENCOUNTER — Other Ambulatory Visit: Payer: Commercial Managed Care - PPO

## 2020-02-11 ENCOUNTER — Ambulatory Visit: Payer: Self-pay

## 2020-02-11 DIAGNOSIS — B349 Viral infection, unspecified: Secondary | ICD-10-CM

## 2020-02-11 DIAGNOSIS — Z1152 Encounter for screening for COVID-19: Secondary | ICD-10-CM

## 2020-02-11 NOTE — ED Provider Notes (Signed)
Roderic Palau    CSN: [id number] Arrival date & time: 02/11/20  1059      History   Chief Complaint Chief Complaint  Patient presents with  . Sore Throat    HPI Makena Mcgrady is a 55 y.o. male.   55 year old male who presents today with complaints of sore throat, rhinorrhea, chills, fatigue, nausea, diarrhea and chest congestion for 4 days.  Symptoms have been constant.  Mother tested positive for COVID yesterday no recorded fevers at home.  Taking over-the-counter medicines with some relief.      Past Medical History:  Diagnosis Date  . Coronary artery disease, non-occlusive 02/28/2008   Dr. Humphrey Rolls @ [facility]: 2 x 40-50% lesions in dominant LCx lesion by cath,  EF 60% with normal wall motion.  . Diabetes mellitus without complication ([place])   . Essential hypertension   . GERD (gastroesophageal reflux disease)   . Headache   . High triglycerides     was previously on treatment, but  he notes that this was stopped  . Kidney stones   . Moderate obesity 05/29/2014  . OSA treated with BiPAP   . Short bowel syndrome 1994    Patient Active Problem List   Diagnosis Date Noted  . Worsening headaches 04/30/2019  . Neck pain 04/30/2019  . Bilateral occipital neuralgia 04/30/2019  . Migraine without aura and without status migrainosus, not intractable 04/30/2019  . Vertigo 04/30/2019  . Abnormal nuclear stress test 02/07/2019  . Atypical angina ([place]) 02/07/2019  . Central adiposity 08/10/2018  . DM (diabetes mellitus) ([place]) 07/08/2018  . Medication management 01/01/2016  . Hypertriglyceridemia without hypercholesterolemia 02/26/2015  . Abnormally low high density lipoprotein (HDL) cholesterol with hypertriglyceridemia 02/26/2015  . Atrial premature contractions 02/26/2015  . Metabolic syndrome 44/01/270  . Rapid heart beat 02/26/2015  . Signs and symptoms involving emotional state 05/29/2014  . Anxiety 05/29/2014  . Adult BMI 30+ 05/29/2014  . Clinical  depression 05/29/2014  . Failure of erection 05/29/2014  . Abnormal LFTs 05/29/2014  . Blood glucose elevated 05/29/2014  . GERD (gastroesophageal reflux disease) 05/29/2014  . Combined fat and carbohydrate induced hyperlipemia 05/29/2014  . Essential hypertension 05/29/2014  . Headache, migraine 05/29/2014  . Obesity, Class I, BMI 30.0-34.9 (see actual BMI) 05/29/2014  . Gastroduodenal ulcer 05/29/2014  . Apnea, sleep 05/29/2014  . Umbilical hernia without obstruction and without gangrene 05/29/2014  . Avitaminosis D 09/14/2009  . Arteriosclerosis of coronary artery 02/27/2009    Past Surgical History:  Procedure Laterality Date  . "stomach wrap"  1991   GERD  . APPENDECTOMY    . CARDIAC CATHETERIZATION  02/2008   Dr. Humphrey Rolls @ [facility]: 2 x 40-50% lesions in a dominant LCx by cath,  EF 60% with normal wall motion.  [name] Nuclear Stress Test   January 2010    heart rate increased from 80-158 BPM. No EKG changes.  there is a moderate sized  reversible defect in the inferior and inferior lateral wall as well as anteroseptal. EF 59%.  . CardioNet Holter Monitor   January 2010    sinus rhythm. Rare PVCs in setting was. Current PACs , seen in singles and pairs. Max heart rate 127, minimum heart rate 57. Average 84.  . CHOLECYSTECTOMY    . COLON SURGERY     due to gangrenous appendix  . COLONOSCOPY WITH PROPOFOL N/A 04/26/2016   Procedure: COLONOSCOPY WITH PROPOFOL;  Surgeon: Jonathon Bellows, MD;  Location: [facility] ENDOSCOPY;  Service: Endoscopy;  Laterality:  N/A;  . ESOPHAGOGASTRODUODENOSCOPY (EGD) WITH PROPOFOL N/A 01/11/2019   Procedure: ESOPHAGOGASTRODUODENOSCOPY (EGD) WITH PROPOFOL;  Surgeon: Jonathon Bellows, MD;  Location: [facility] ENDOSCOPY;  Service: Gastroenterology;  Laterality: N/A;  . FLEXIBLE SIGMOIDOSCOPY N/A 01/11/2019   Procedure: FLEXIBLE SIGMOIDOSCOPY;  Surgeon: Jonathon Bellows, MD;  Location: [facility] ENDOSCOPY;  Service: Gastroenterology;  Laterality: N/A;  . HERNIA REPAIR  2010  . INNER  EAR SURGERY    . LEFT HEART CATH AND CORONARY ANGIOGRAPHY N/A 02/12/2019   Procedure: LEFT HEART CATH AND CORONARY ANGIOGRAPHY;  Surgeon: Leonie Man, MD;  Location: [place] CV LAB;  Service: Cardiovascular;  Laterality: N/A;  . NM MYOVIEW LTD  01/10/2019   Treadmill Myoview: Walk 9:40 min-10.1 METS) EF ~45 to 50%.  INTERMEDIATE RISK STUDY-moderate sized mostly fixed perfusion defect in anterior apical segment.  No reversibility.  Suggests prior infarct without ischemia.  . TONSILLECTOMY    . TYMPANOSTOMY TUBE PLACEMENT Bilateral 1973       Home Medications    Prior to Admission medications   Medication Sig Start Date End Date Taking? Authorizing Provider  bismuth subsalicylate (PEPTO BISMOL) 262 MG/15ML suspension Take 30 mLs by mouth every 6 (six) hours as needed for indigestion.   Yes [provider]  Butalbital-Acetaminophen (BUTALBITAL-APAP PO) Take by mouth.   Yes [provider]  butalbital-acetaminophen-caffeine (FIORICET) 50-325-40 MG tablet TAKE 1 TABLET BY MOUTH 2 (TWO) TIMES DAILY AS NEEDED FOR HEADACHE. 09/06/18  Yes [name]., MD  hyoscyamine (LEVSIN) 0.125 MG tablet TAKE 1 TABLET BY MOUTH 4 TIMES A DAY AS NEEDED FOR PAIN 08/29/19  Yes Jonathon Bellows, MD  losartan (COZAAR) 100 MG tablet TAKE 1 TABLET BY MOUTH EVERY DAY. TAKE WITH HCTZ 01/06/20  Yes [name]., MD  Magnesium 100 MG CAPS Take 200 mg by mouth daily.   Yes [provider]  Menthol, Topical Analgesic, (BIOFREEZE EX) Apply 1 application topically daily as needed (pain).   Yes [provider]  metFORMIN (GLUCOPHAGE) 1000 MG tablet TAKE 1 TABLET (1,000 MG TOTAL) BY MOUTH 2 (TWO) TIMES DAILY WITH A MEAL. 01/03/20  Yes [name]., MD  metoprolol succinate (TOPROL-XL) 100 MG 24 hr tablet TAKE 1 TABLET (100 MG TOTAL) BY MOUTH DAILY. TAKE WITH OR IMMEDIATELY FOLLOWING A MEAL. 01/14/20  Yes Leonie Man, MD  omeprazole (PRILOSEC OTC) 20 MG tablet  Take 1 tablet (20 mg total) by mouth daily. 05/14/15  Yes [name]., MD  pioglitazone (ACTOS) 15 MG tablet Take 1 tablet (15 mg total) by mouth daily. 03/07/19  Yes [name]., MD  Saw Palmetto, Serenoa repens, (SAW PALMETTO PO) Take 500 mg by mouth daily.   Yes [provider]  Simethicone 125 MG CAPS Take 125 mg by mouth daily as needed (gas).   Yes [provider]  tadalafil (CIALIS) 20 MG tablet Take 1 tablet (20 mg total) by mouth every three (3) days as needed for erectile dysfunction. 01/30/20  Yes [name]., MD  vitamin C (ASCORBIC ACID) 250 MG tablet Take 1,000 mg by mouth daily.   Yes [provider]  rosuvastatin (CRESTOR) 20 MG tablet TAKE 1 TABLET BY MOUTH EVERY DAY 05/28/19 02/11/20 Yes Leonie Man, MD  CINNAMON PO Take 1,200 mg by mouth daily.    [provider]  Homeopathic Products ([place]) FOAM Apply 1 application topically daily as needed (pain/cramps).    [provider]  chlorthalidone (HYGROTON) 50 MG  tablet Take 0.5 tablets (25 mg total) by mouth daily. 02/07/19 02/11/20  Leonie Man, MD  cholestyramine (QUESTRAN) 4 g packet TAKE 1 PACKET (4 G TOTAL) BY MOUTH 2 (TWO) TIMES DAILY. 10/14/19 02/11/20  Jonathon Bellows, MD  fenofibrate 160 MG tablet Take 1 tablet (160 mg total) by mouth daily. 02/14/18 02/11/20  Leonie Man, MD  imipramine (TOFRANIL) 25 MG tablet TAKE 1 TABLET BY MOUTH EVERYDAY AT BEDTIME. Call office at [phone] to schedule follow up for ongoing refills. 11/19/19 02/11/20  Sater, Nanine Means, MD  VASCEPA 1 g capsule TAKE 2 CAPSULES (2 G TOTAL) BY MOUTH 2 (TWO) TIMES DAILY. 02/05/19 02/11/20  Leonie Man, MD    Family History Family History  Problem Relation Age of Onset  . Hypertension Mother   . Irritable bowel syndrome Mother   . Migraines Mother   . Lumbar disc disease Mother   . Heart attack Mother   . Heart attack Maternal Uncle   . Heart attack Maternal  Grandfather     Social History Social History   Tobacco Use  . Smoking status: Former Smoker    Packs/day: 1.00    Years: 28.00    Pack years: 28.00    Types: Cigarettes    Quit date: 12/30/2005    Years since quitting: 14.1  . Smokeless tobacco: Never Used  Substance Use Topics  . Alcohol use: Yes    Comment: Once or twice a year  . Drug use: Not Currently    Types: Marijuana    Comment: Quit in 1982     Allergies   Mucinex [guaifenesin er]   Review of Systems Review of Systems   Physical Exam Triage Vital Signs ED Triage Vitals  Enc Vitals Group     BP 02/11/20 1114 (!) 135/97     Pulse Rate 02/11/20 1114 83     Resp 02/11/20 1114 18     Temp 02/11/20 1114 98.5 F (36.9 C)     Temp Source 02/11/20 1114 Oral     SpO2 02/11/20 1114 97 %     Weight --      Height --      Head Circumference --      Peak Flow --      Pain Score 02/11/20 1120 6     Pain Loc --      Pain Edu? --      Excl. in [place]? --    No data found.  Updated Vital Signs BP (!) 135/97 (BP Location: Left Arm)   Pulse 83   Temp 98.5 F (36.9 C) (Oral)   Resp 18   SpO2 97%   Visual Acuity Right Eye Distance:   Left Eye Distance:   Bilateral Distance:    Right Eye Near:   Left Eye Near:    Bilateral Near:     Physical Exam Vitals and nursing note reviewed.  Constitutional:      Appearance: Normal appearance.  HENT:     Head: Normocephalic and atraumatic.  Eyes:     Conjunctiva/sclera: Conjunctivae normal.  Pulmonary:     Effort: Pulmonary effort is normal.  Musculoskeletal:        General: Normal range of motion.     Cervical back: Normal range of motion.  Skin:    General: Skin is warm and dry.  Neurological:     Mental Status: He is alert.  Psychiatric:        Mood and Affect: Mood normal.  UC Treatments / Results  Labs (all labs ordered are listed, but only abnormal results are displayed) Labs Reviewed  NOVEL CORONAVIRUS, NAA    EKG   Radiology No  results found.  Procedures Procedures (including critical care time)  Medications Ordered in UC Medications - No data to display  Initial Impression / Assessment and Plan / UC Course  I have reviewed the triage vital signs and the nursing notes.  Pertinent labs & imaging results that were available during my care of the patient were reviewed by me and considered in my medical decision making (see chart for details).     Viral illness with COVID exposure COVID test pending.  Recommend over-the-counter medicines as needed. Follow up as needed for continued or worsening symptoms  Final Clinical Impressions(s) / UC Diagnoses   Final diagnoses:  Encounter for screening for COVID-19  Viral illness     Discharge Instructions     Covid test pending OTC medicines as needed Follow up as needed for continued or worsening symptoms     ED Prescriptions    None     PDMP not reviewed this encounter.   Orvan July, NP 02/11/20 1226

## 2020-02-11 NOTE — Discharge Instructions (Addendum)
Covid test pending  OTC medicines as needed.  Follow up as needed for continued or worsening symptoms  

## 2020-02-11 NOTE — ED Triage Notes (Signed)
Runny nose, sore throat, chills, fatigue, nausea, diarrhea, chest congestion x 4 days.  Mother lives in house and has tested positive for COVID.

## 2020-02-12 ENCOUNTER — Other Ambulatory Visit: Payer: Self-pay | Admitting: Neurology

## 2020-02-14 LAB — NOVEL CORONAVIRUS, NAA: SARS-CoV-2, NAA: DETECTED — AB

## 2020-02-16 ENCOUNTER — Other Ambulatory Visit: Payer: Self-pay | Admitting: Family Medicine

## 2020-02-16 DIAGNOSIS — E119 Type 2 diabetes mellitus without complications: Secondary | ICD-10-CM

## 2020-03-09 ENCOUNTER — Other Ambulatory Visit: Payer: Self-pay | Admitting: Neurology

## 2020-03-25 ENCOUNTER — Ambulatory Visit: Payer: Commercial Managed Care - PPO | Admitting: Cardiology

## 2020-03-27 ENCOUNTER — Other Ambulatory Visit: Payer: Self-pay | Admitting: Family Medicine

## 2020-04-09 ENCOUNTER — Other Ambulatory Visit: Payer: Self-pay

## 2020-04-09 ENCOUNTER — Ambulatory Visit: Payer: BC Managed Care – PPO | Admitting: Family Medicine

## 2020-04-09 ENCOUNTER — Encounter: Payer: Self-pay | Admitting: Family Medicine

## 2020-04-09 VITALS — BP 124/85 | HR 76 | Temp 98.3°F | Resp 16 | Ht 69.0 in | Wt 231.0 lb

## 2020-04-09 DIAGNOSIS — E669 Obesity, unspecified: Secondary | ICD-10-CM | POA: Diagnosis not present

## 2020-04-09 DIAGNOSIS — G4733 Obstructive sleep apnea (adult) (pediatric): Secondary | ICD-10-CM | POA: Diagnosis not present

## 2020-04-09 DIAGNOSIS — E119 Type 2 diabetes mellitus without complications: Secondary | ICD-10-CM

## 2020-04-09 DIAGNOSIS — I10 Essential (primary) hypertension: Secondary | ICD-10-CM

## 2020-04-09 LAB — POCT GLYCOSYLATED HEMOGLOBIN (HGB A1C): Hemoglobin A1C: 8.2 % — AB (ref 4.0–5.6)

## 2020-04-09 MED ORDER — PIOGLITAZONE HCL 30 MG PO TABS: 30.0000 mg | ORAL_TABLET | Freq: Every day | ORAL | 11 refills | Status: DC

## 2020-04-09 NOTE — Progress Notes (Signed)
Established patient visit   Patient: Robert Franklin   DOB: 1966-01-07   55 y.o. Male  MRN: [id number] Visit Date: 04/09/2020  Today's healthcare provider: [name], MD   Chief Complaint  Patient presents with  . Diabetes  . Hypertension  . Hyperlipidemia  . Migraine        Subjective    HPI Patient comes in today for follow-up.  He is unvaccinated and had COVID 2 months ago.  Felt like the flu.  He has recovered completely.  Declines to get vaccines He has a new job and likes the new job.  Is enjoyed his marriage.  He is not really working on diet and exercise. Diabetes Mellitus Type II, follow-up  Lab Results  Component Value Date   HGBA1C 8.2 (A) 04/09/2020   HGBA1C 7.2 (H) 10/10/2019   HGBA1C 6.3 (A) 06/04/2019   Last seen for diabetes 6 months ago.  Management since then includes; labs checked showing-stable but diabetes slightly higher. Patient can work on diet and exercise and/or we can add pioglitazone 15 mg daily. Patient declined pioglitazone.  He reports good compliance with treatment. He is not having side effects.   Home blood sugar records: 190s-200s  Episodes of hypoglycemia? No    Current insulin regiment: none Most Recent Eye Exam: 07/2019   Hypertension, follow-up  BP Readings from Last 3 Encounters:  04/09/20 124/85  02/11/20 (!) 135/97  10/10/19 127/82   Wt Readings from Last 3 Encounters:  04/09/20 231 lb (104.8 kg)  10/10/19 227 lb 9.6 oz (103.2 kg)  06/04/19 225 lb (102.1 kg)     He was last seen for hypertension 4 months ago.  BP at that visit was 127/82. Management since that visit includes; on losartan and metoprolol. He reports good compliance with treatment. He is not having side effects.  He is not exercising. He is adherent to low salt diet.   Outside blood pressures are not being checked.  He does not smoke.  Use of agents associated with hypertension: none.   Lipid/Cholesterol, follow-up  Last  Lipid Panel: Lab Results  Component Value Date   CHOL 151 10/10/2019   LDLCALC 60 10/10/2019   LDLDIRECT 46 07/05/2018   HDL 30 (L) 10/10/2019   TRIG 395 (H) 10/10/2019    He was last seen for this 6 months ago.  Management since that visit includes; not currently on a medication.  He reports good compliance with treatment. He is not having side effects.  He is following a Regular diet. Current exercise: no regular exercise  Last metabolic panel Lab Results  Component Value Date   GLUCOSE 146 (H) 10/10/2019   NA 138 10/10/2019   K 4.2 10/10/2019   BUN 19 10/10/2019   CREATININE 1.15 10/10/2019   GFRNONAA 72 10/10/2019   GFRAA 84 10/10/2019   CALCIUM 9.9 10/10/2019   AST 39 10/10/2019   ALT 40 10/10/2019   The 10-year ASCVD risk score Mikey Bussing DC Jr., et al., 2013) is: 11%  ---------------------------------------------------------------------------------------------------       Medications: Outpatient Medications Prior to Visit  Medication Sig  . bismuth subsalicylate (PEPTO BISMOL) 262 MG/15ML suspension Take 30 mLs by mouth every 6 (six) hours as needed for indigestion.  . Butalbital-Acetaminophen (BUTALBITAL-APAP PO) Take by mouth.  . butalbital-acetaminophen-caffeine (FIORICET) 50-325-40 MG tablet TAKE 1 TABLET BY MOUTH 2 (TWO) TIMES DAILY AS NEEDED FOR HEADACHE.  Marland Kitchen CINNAMON PO Take 1,200 mg by mouth daily.  . Homeopathic  Products [facility] RELIEF) FOAM Apply 1 application topically daily as needed (pain/cramps).  . hyoscyamine (LEVSIN) 0.125 MG tablet TAKE 1 TABLET BY MOUTH 4 TIMES A DAY AS NEEDED FOR PAIN  . losartan (COZAAR) 100 MG tablet TAKE 1 TABLET BY MOUTH EVERY DAY. TAKE WITH HCTZ  . Magnesium 100 MG CAPS Take 200 mg by mouth daily.  . Menthol, Topical Analgesic, (BIOFREEZE EX) Apply 1 application topically daily as needed (pain).  . metFORMIN (GLUCOPHAGE) 1000 MG tablet TAKE 1 TABLET (1,000 MG TOTAL) BY MOUTH 2 (TWO) TIMES DAILY WITH A MEAL.  .  metoprolol succinate (TOPROL-XL) 100 MG 24 hr tablet TAKE 1 TABLET (100 MG TOTAL) BY MOUTH DAILY. TAKE WITH OR IMMEDIATELY FOLLOWING A MEAL.  Marland Kitchen omeprazole (PRILOSEC OTC) 20 MG tablet Take 1 tablet (20 mg total) by mouth daily.  . pioglitazone (ACTOS) 15 MG tablet TAKE 1 TABLET BY MOUTH EVERY DAY  . Saw Palmetto, Serenoa repens, (SAW PALMETTO PO) Take 500 mg by mouth daily.  . Simethicone 125 MG CAPS Take 125 mg by mouth daily as needed (gas).  . tadalafil (CIALIS) 20 MG tablet Take 1 tablet (20 mg total) by mouth every three (3) days as needed for erectile dysfunction.  . vitamin C (ASCORBIC ACID) 250 MG tablet Take 1,000 mg by mouth daily.   No facility-administered medications prior to visit.    Review of Systems  Constitutional: Negative for appetite change, chills and fever.  Respiratory: Negative for chest tightness, shortness of breath and wheezing.   Cardiovascular: Negative for chest pain and palpitations.  Gastrointestinal: Negative for abdominal pain, nausea and vomiting.        Objective    BP 124/85   Pulse 76   Temp 98.3 F (36.8 C)   Resp 16   Ht 5\' 9"  (1.753 m)   Wt 231 lb (104.8 kg)   BMI 34.11 kg/m  BP Readings from Last 3 Encounters:  04/09/20 124/85  02/11/20 (!) 135/97  10/10/19 127/82   Wt Readings from Last 3 Encounters:  04/09/20 231 lb (104.8 kg)  10/10/19 227 lb 9.6 oz (103.2 kg)  06/04/19 225 lb (102.1 kg)       Physical Exam Vitals reviewed.  Constitutional:      Appearance: He is well-developed.  HENT:     Head: Normocephalic and atraumatic.     Right Ear: External ear normal.     Left Ear: External ear normal.     Nose: Nose normal.  Eyes:     General: No scleral icterus.    Conjunctiva/sclera: Conjunctivae normal.  Neck:     Thyroid: No thyromegaly.  Cardiovascular:     Rate and Rhythm: Normal rate and regular rhythm.     Heart sounds: Normal heart sounds.  Pulmonary:     Effort: Pulmonary effort is normal.     Breath sounds:  Normal breath sounds.  Abdominal:     Palpations: Abdomen is soft.  Skin:    General: Skin is warm and dry.  Neurological:     General: No focal deficit present.     Mental Status: He is alert and oriented to person, place, and time.  Psychiatric:        Mood and Affect: Mood normal.        Behavior: Behavior normal.        Thought Content: Thought content normal.        Judgment: Judgment normal.       Results for orders placed or performed  in visit on 04/09/20  POCT glycosylated hemoglobin (Hb A1C)  Result Value Ref Range   Hemoglobin A1C 8.2 (A) 4.0 - 5.6 %   HbA1c POC (<> result, manual entry)     HbA1c, POC (prediabetic range)     HbA1c, POC (controlled diabetic range)      Assessment & Plan     1. Type 2 diabetes mellitus without complication, without long-term current use of insulin (HCC) A1c today 8.2 which is up a little bit.  Increase Actos from 15 to 30 mg daily.  I think he would do well on Ozempic.  He declines. - POCT glycosylated hemoglobin (Hb A1C) - pioglitazone (ACTOS) 30 MG tablet; Take 1 tablet (30 mg total) by mouth daily.  Dispense: 30 tablet; Refill: 11  2. Essential hypertension Good control  3. Obstructive sleep apnea syndrome On BiPAP  4. Obesity, Class I, BMI 30.0-34.9 (see actual BMI) Diet and exercise stressed.   No follow-ups on file.      I, [name], MD, have reviewed all documentation for this visit. The documentation on 04/12/20 for the exam, diagnosis, procedures, and orders are all accurate and complete.    Taquana Bartley Cranford Mon, MD  [facility] [phone] (phone) [phone] (fax)  [place]

## 2020-04-27 ENCOUNTER — Other Ambulatory Visit: Payer: Self-pay | Admitting: Neurology

## 2020-05-17 ENCOUNTER — Other Ambulatory Visit: Payer: Self-pay | Admitting: Family Medicine

## 2020-05-17 DIAGNOSIS — E119 Type 2 diabetes mellitus without complications: Secondary | ICD-10-CM

## 2020-05-19 ENCOUNTER — Other Ambulatory Visit: Payer: Self-pay | Admitting: Cardiology

## 2020-06-15 ENCOUNTER — Other Ambulatory Visit: Payer: Self-pay | Admitting: Cardiology

## 2020-06-15 ENCOUNTER — Other Ambulatory Visit: Payer: Self-pay | Admitting: Family Medicine

## 2020-06-15 ENCOUNTER — Other Ambulatory Visit: Payer: Self-pay | Admitting: Neurology

## 2020-06-15 NOTE — Telephone Encounter (Signed)
Requested Prescriptions  Pending Prescriptions Disp Refills  . metFORMIN (GLUCOPHAGE) 1000 MG tablet [Pharmacy Med Name: METFORMIN HCL 1,000 MG TABLET] 180 tablet 0    Sig: TAKE 1 TABLET (1,000 MG TOTAL) BY MOUTH 2 (TWO) TIMES DAILY WITH A MEAL.     Endocrinology:  Diabetes - Biguanides Failed - 06/15/2020  8:52 PM      Failed - HBA1C is between 0 and 7.9 and within 180 days    Hemoglobin A1C  Date Value Ref Range Status  04/09/2020 8.2 (A) 4.0 - 5.6 % Final   Hgb A1c MFr Bld  Date Value Ref Range Status  10/10/2019 7.2 (H) 4.8 - 5.6 % Final    Comment:             Prediabetes: 5.7 - 6.4          Diabetes: >6.4          Glycemic control for adults with diabetes: <7.0          Passed - Cr in normal range and within 360 days    Creatinine  Date Value Ref Range Status  01/29/2013 1.01 0.60 - 1.30 mg/dL Final   Creatinine, Ser  Date Value Ref Range Status  10/10/2019 1.15 0.76 - 1.27 mg/dL Final         Passed - eGFR in normal range and within 360 days    EGFR (African American)  Date Value Ref Range Status  01/29/2013 >60  Final   GFR calc Af Amer  Date Value Ref Range Status  10/10/2019 84 >59 mL/min/1.73 Final    Comment:    **[facility] currently reports eGFR in compliance with the current**   recommendations of the [employer]. [facility] will   update reporting as new guidelines are published from the NKF-ASN   Task force.    EGFR (Non-African Amer.)  Date Value Ref Range Status  01/29/2013 >60  Final    Comment:    eGFR values <22mL/min/1.73 m2 may be an indication of chronic kidney disease (CKD). Calculated eGFR is useful in patients with stable renal function. The eGFR calculation will not be reliable in acutely ill patients when serum creatinine is changing rapidly. It is not useful in  patients on dialysis. The eGFR calculation may not be applicable to patients at the low and high extremes of body sizes, pregnant women, and vegetarians.     GFR calc non Af Amer  Date Value Ref Range Status  10/10/2019 72 >59 mL/min/1.73 Final         Passed - Valid encounter within last 6 months    Recent Outpatient Visits          2 months ago Type 2 diabetes mellitus without complication, without long-term current use of insulin [facility])   [facility] [name]., MD   8 months ago Annual physical exam   [facility] [name]., MD   1 year ago Type 2 diabetes mellitus without complication, without long-term current use of insulin [facility])   [facility] [name]., MD   1 year ago Type 2 diabetes mellitus without complication, without long-term current use of insulin [facility])   [facility] [name]., MD   1 year ago Type 2 diabetes mellitus without complication, without long-term current use of insulin [facility])   [facility] [name]., MD      Future Appointments  In 1 month [name]., MD [facility], [facility]

## 2020-06-15 NOTE — Telephone Encounter (Signed)
Requested Prescriptions  Pending Prescriptions Disp Refills  . losartan (COZAAR) 100 MG tablet [Pharmacy Med Name: LOSARTAN POTASSIUM 100 MG TAB] 90 tablet 1    Sig: TAKE 1 TABLET BY MOUTH EVERY DAY. TAKE WITH HYDROCHLOROTHIAZIDE     Cardiovascular:  Angiotensin Receptor Blockers Failed - 06/15/2020  6:53 PM      Failed - Cr in normal range and within 180 days    Creatinine  Date Value Ref Range Status  01/29/2013 1.01 0.60 - 1.30 mg/dL Final   Creatinine, Ser  Date Value Ref Range Status  10/10/2019 1.15 0.76 - 1.27 mg/dL Final         Failed - K in normal range and within 180 days    Potassium  Date Value Ref Range Status  10/10/2019 4.2 3.5 - 5.2 mmol/L Final  01/29/2013 4.1 3.5 - 5.1 mmol/L Final         Passed - Patient is not pregnant      Passed - Last BP in normal range    BP Readings from Last 1 Encounters:  04/09/20 124/85         Passed - Valid encounter within last 6 months    Recent Outpatient Visits          2 months ago Type 2 diabetes mellitus without complication, without long-term current use of insulin ([place])   [facility] [name]., MD   8 months ago Annual physical exam   [facility] [name]., MD   1 year ago Type 2 diabetes mellitus without complication, without long-term current use of insulin [facility])   [facility] [name]., MD   1 year ago Type 2 diabetes mellitus without complication, without long-term current use of insulin [facility])   [facility] [name]., MD   1 year ago Type 2 diabetes mellitus without complication, without long-term current use of insulin [facility])   [facility] [name]., MD      Future Appointments            In 1 month [name]., MD [facility], [facility]

## 2020-06-17 ENCOUNTER — Other Ambulatory Visit: Payer: Self-pay

## 2020-06-26 MED ORDER — CHLORTHALIDONE 25 MG PO TABS
25.0000 mg | ORAL_TABLET | Freq: Every day | ORAL | 1 refills | Status: DC
Start: 2020-06-26 — End: 2020-10-01

## 2020-06-26 NOTE — Addendum Note (Signed)
Addended by: Raiford Simmonds on: 06/26/2020 05:31 PM   Modules accepted: Orders

## 2020-06-26 NOTE — Telephone Encounter (Signed)
PATIENT WAS TAKING 1/2 TABLET OF 50 MG CHLORTHALIDONE.RX CDISCONTINUED   PRESCRIPTION CHANGED TO 25 MG TABLET OF CHLORTHALIDONE DAILY  ( MESSAGE PATIENT NEEDS APPOINTMENT)

## 2020-07-18 DIAGNOSIS — H60312 Diffuse otitis externa, left ear: Secondary | ICD-10-CM | POA: Diagnosis not present

## 2020-07-18 DIAGNOSIS — Z8639 Personal history of other endocrine, nutritional and metabolic disease: Secondary | ICD-10-CM | POA: Diagnosis not present

## 2020-08-11 ENCOUNTER — Other Ambulatory Visit: Payer: Self-pay

## 2020-08-11 ENCOUNTER — Other Ambulatory Visit: Payer: Self-pay | Admitting: *Deleted

## 2020-08-11 ENCOUNTER — Ambulatory Visit: Payer: BC Managed Care – PPO | Admitting: Family Medicine

## 2020-08-11 ENCOUNTER — Encounter: Payer: Self-pay | Admitting: Family Medicine

## 2020-08-11 VITALS — BP 130/81 | HR 77 | Resp 16 | Ht 69.0 in | Wt 235.0 lb

## 2020-08-11 DIAGNOSIS — E781 Pure hyperglyceridemia: Secondary | ICD-10-CM

## 2020-08-11 DIAGNOSIS — I10 Essential (primary) hypertension: Secondary | ICD-10-CM

## 2020-08-11 DIAGNOSIS — E669 Obesity, unspecified: Secondary | ICD-10-CM | POA: Diagnosis not present

## 2020-08-11 DIAGNOSIS — Z23 Encounter for immunization: Secondary | ICD-10-CM

## 2020-08-11 DIAGNOSIS — E119 Type 2 diabetes mellitus without complications: Secondary | ICD-10-CM | POA: Diagnosis not present

## 2020-08-11 NOTE — Progress Notes (Signed)
I,[name],acting as a scribe for [name], MD.,have documented all relevant documentation on the behalf of [name], MD,as directed by  [name], MD while in the presence of [name], MD.   Established patient visit   Patient: Robert Franklin   DOB: 05/29/1965   55 y.o. Male  MRN: [id number] Visit Date: 08/11/2020  Today's healthcare provider: [name], MD   Chief Complaint  Patient presents with   Follow-up   Diabetes   Hypertension   Hyperlipidemia   Subjective    HPI  Patient comes in today for follow-up.  He is doing fairly well and has no new complaints.  He states he is taking his medications.  He is not exercising nor is he following any dietary restrictions Diabetes Mellitus Type II, follow-up  Lab Results  Component Value Date   HGBA1C 8.6 (H) 08/11/2020   HGBA1C 8.2 (A) 04/09/2020   HGBA1C 7.2 (H) 10/10/2019   Last seen for diabetes 4 months ago.  Management since then includes; Increase Actos from 15 to 30 mg daily.  I think he would do well on Ozempic.  He declines. He reports good compliance with treatment. He is not having side effects. none  Home blood sugar records: fasting range: 170-220  Episodes of hypoglycemia? No none   Current insulin regiment: n/a Most Recent Eye Exam: due  ----------------------------------------------------------------------------   Hypertension, follow-up  BP Readings from Last 3 Encounters:  08/11/20 130/81  04/09/20 124/85  02/11/20 (!) 135/97   Wt Readings from Last 3 Encounters:  08/11/20 235 lb (106.6 kg)  04/09/20 231 lb (104.8 kg)  10/10/19 227 lb 9.6 oz (103.2 kg)     He was last seen for hypertension 4 months ago.  BP at that visit was 124/85. Management since that visit includes; good control. He reports good compliance with treatment. He is not having side effects. none He is not exercising. He is not adherent to low salt diet.   Outside  blood pressures are normal 122/80.  He does not smoke.  Use of agents associated with hypertension: none.   ----------------------------------------------------------------------------  Lipid/Cholesterol, follow-up  Last Lipid Panel: Lab Results  Component Value Date   CHOL 257 (H) 08/11/2020   LDLCALC 99 08/11/2020   LDLDIRECT 46 07/05/2018   HDL 30 (L) 08/11/2020   TRIG 743 (HH) 08/11/2020    He was last seen for this 9 months ago.  Management since that visit includes; not on medication.  He reports poor compliance with treatment. He is not having side effects. none  He is following a Regular diet. Current exercise: none  Last metabolic panel Lab Results  Component Value Date   GLUCOSE 125 (H) 08/11/2020   NA 136 08/11/2020   K 4.1 08/11/2020   BUN 23 08/11/2020   CREATININE 1.00 08/11/2020   GFRNONAA 72 10/10/2019   GFRAA 84 10/10/2019   CALCIUM 10.0 08/11/2020   AST 47 (H) 08/11/2020   ALT 46 (H) 08/11/2020   The 10-year ASCVD risk score Mikey Bussing DC Jr., et al., 2013) is: 23.5%  ----------------------------------------------------------------------------       Medications: Outpatient Medications Prior to Visit  Medication Sig   bismuth subsalicylate (PEPTO BISMOL) 262 MG/15ML suspension Take 30 mLs by mouth every 6 (six) hours as needed for indigestion.   Butalbital-Acetaminophen (BUTALBITAL-APAP PO) Take by mouth.   butalbital-acetaminophen-caffeine (FIORICET) 50-325-40 MG tablet TAKE 1 TABLET BY MOUTH 2 (TWO) TIMES DAILY AS NEEDED FOR  HEADACHE.   chlorthalidone (HYGROTON) 25 MG tablet Take 1 tablet (25 mg total) by mouth daily. WILL NEED AN APPOINTMENT   CINNAMON PO Take 1,200 mg by mouth daily.   Homeopathic Products (THERAWORX RELIEF) FOAM Apply 1 application topically daily as needed (pain/cramps).   hyoscyamine (LEVSIN) 0.125 MG tablet TAKE 1 TABLET BY MOUTH 4 TIMES A DAY AS NEEDED FOR PAIN   losartan (COZAAR) 100 MG tablet TAKE 1 TABLET BY MOUTH  EVERY DAY. TAKE WITH HYDROCHLOROTHIAZIDE   Magnesium 100 MG CAPS Take 200 mg by mouth daily.   Menthol, Topical Analgesic, (BIOFREEZE EX) Apply 1 application topically daily as needed (pain).   metFORMIN (GLUCOPHAGE) 1000 MG tablet TAKE 1 TABLET (1,000 MG TOTAL) BY MOUTH 2 (TWO) TIMES DAILY WITH A MEAL.   metoprolol succinate (TOPROL-XL) 100 MG 24 hr tablet TAKE 1 TABLET (100 MG TOTAL) BY MOUTH DAILY. TAKE WITH OR IMMEDIATELY FOLLOWING A MEAL.   omeprazole (PRILOSEC OTC) 20 MG tablet Take 1 tablet (20 mg total) by mouth daily.   pioglitazone (ACTOS) 30 MG tablet Take 1 tablet (30 mg total) by mouth daily.   Saw Palmetto, Serenoa repens, (SAW PALMETTO PO) Take 500 mg by mouth daily.   Simethicone 125 MG CAPS Take 125 mg by mouth daily as needed (gas).   tadalafil (CIALIS) 20 MG tablet Take 1 tablet (20 mg total) by mouth every three (3) days as needed for erectile dysfunction.   vitamin C (ASCORBIC ACID) 250 MG tablet Take 1,000 mg by mouth daily.   No facility-administered medications prior to visit.    Review of Systems  Constitutional:  Negative for appetite change, chills and fever.  Respiratory:  Negative for chest tightness, shortness of breath and wheezing.   Cardiovascular:  Negative for chest pain and palpitations.  Gastrointestinal:  Negative for abdominal pain, nausea and vomiting.       Objective    BP 130/81 (BP Location: Right Arm, Patient Position: Sitting, Cuff Size: Large)   Pulse 77   Resp 16   Ht _0  (1.753 m)   Wt 235 lb (106.6 kg)   SpO2 99%   BMI 34.70 kg/m  BP Readings from Last 3 Encounters:  08/11/20 130/81  04/09/20 124/85  02/11/20 (!) 135/97   Wt Readings from Last 3 Encounters:  08/11/20 235 lb (106.6 kg)  04/09/20 231 lb (104.8 kg)  10/10/19 227 lb 9.6 oz (103.2 kg)       Physical Exam Vitals reviewed.  Constitutional:      Appearance: He is well-developed.  HENT:     Head: Normocephalic and atraumatic.     Right Ear: External ear  normal.     Left Ear: External ear normal.     Nose: Nose normal.  Eyes:     General: No scleral icterus.    Conjunctiva/sclera: Conjunctivae normal.  Neck:     Thyroid: No thyromegaly.  Cardiovascular:     Rate and Rhythm: Normal rate and regular rhythm.     Heart sounds: Normal heart sounds.  Pulmonary:     Effort: Pulmonary effort is normal.     Breath sounds: Normal breath sounds.  Abdominal:     Palpations: Abdomen is soft.  Skin:    General: Skin is warm and dry.  Neurological:     General: No focal deficit present.     Mental Status: He is alert and oriented to person, place, and time.  Psychiatric:        Mood and Affect:  Mood normal.        Behavior: Behavior normal.        Thought Content: Thought content normal.        Judgment: Judgment normal.      Results for orders placed or performed in visit on 08/11/20  Hemoglobin A1c  Result Value Ref Range   Hgb A1c MFr Bld 8.6 (H) 4.8 - 5.6 %   Est. average glucose Bld gHb Est-mCnc 200 mg/dL  Lipid panel  Result Value Ref Range   Cholesterol, Total 257 (H) 100 - 199 mg/dL   Triglycerides 743 (HH) 0 - 149 mg/dL   HDL 30 (L) >39 mg/dL   VLDL Cholesterol Cal 128 (H) 5 - 40 mg/dL   LDL Chol Calc (NIH) 99 0 - 99 mg/dL   Chol/HDL Ratio 8.6 (H) 0.0 - 5.0 ratio  CBC w/Diff/Platelet  Result Value Ref Range   WBC 8.5 3.4 - 10.8 x10E3/uL   RBC 5.45 4.14 - 5.80 x10E6/uL   Hemoglobin 15.7 13.0 - 17.7 g/dL   Hematocrit 48.0 37.5 - 51.0 %   MCV 88 79 - 97 fL   MCH 28.8 26.6 - 33.0 pg   MCHC 32.7 31.5 - 35.7 g/dL   RDW 12.7 11.6 - 15.4 %   Platelets 212 150 - 450 x10E3/uL   Neutrophils 58 Not Estab. %   Lymphs 30 Not Estab. %   Monocytes 8 Not Estab. %   Eos 2 Not Estab. %   Basos 1 Not Estab. %   Neutrophils Absolute 5.0 1.4 - 7.0 x10E3/uL   Lymphocytes Absolute 2.6 0.7 - 3.1 x10E3/uL   Monocytes Absolute 0.7 0.1 - 0.9 x10E3/uL   EOS (ABSOLUTE) 0.2 0.0 - 0.4 x10E3/uL   Basophils Absolute 0.1 0.0 - 0.2 x10E3/uL    Immature Granulocytes 1 Not Estab. %   Immature Grans (Abs) 0.1 0.0 - 0.1 x10E3/uL  Comprehensive Metabolic Panel (CMET)  Result Value Ref Range   Glucose 125 (H) 65 - 99 mg/dL   BUN 23 6 - 24 mg/dL   Creatinine, Ser 1.00 0.76 - 1.27 mg/dL   eGFR 89 >59 mL/min/1.73   BUN/Creatinine Ratio 23 (H) 9 - 20   Sodium 136 134 - 144 mmol/L   Potassium 4.1 3.5 - 5.2 mmol/L   Chloride 96 96 - 106 mmol/L   CO2 26 20 - 29 mmol/L   Calcium 10.0 8.7 - 10.2 mg/dL   Total Protein 7.8 6.0 - 8.5 g/dL   Albumin 4.7 3.8 - 4.9 g/dL   Globulin, Total 3.1 1.5 - 4.5 g/dL   Albumin/Globulin Ratio 1.5 1.2 - 2.2   Bilirubin Total 0.6 0.0 - 1.2 mg/dL   Alkaline Phosphatase 73 44 - 121 IU/L   AST 47 (H) 0 - 40 IU/L   ALT 46 (H) 0 - 44 IU/L  TSH  Result Value Ref Range   TSH 1.700 0.450 - 4.500 uIU/mL    Assessment & Plan     1. Type 2 diabetes mellitus without complication, without long-term current use of insulin (HCC) Goal A1c less than 7 - Hemoglobin A1c - Lipid panel - CBC w/Diff/Platelet - Comprehensive Metabolic Panel (CMET) - TSH  2. Essential hypertension  - Hemoglobin A1c - Lipid panel - CBC w/Diff/Platelet - Comprehensive Metabolic Panel (CMET) - TSH  3. Hypertriglyceridemia without hypercholesterolemia Triglycerides are very high but patient does not work at all on dietary or exercise intervention.  Stressed the importance of low-carb and regular exercise. - Hemoglobin A1c -  Lipid panel - CBC w/Diff/Platelet - Comprehensive Metabolic Panel (CMET) - TSH  4. Obesity, Class I, BMI 30.0-34.9 (see actual BMI)  - Hemoglobin A1c - Lipid panel - CBC w/Diff/Platelet - Comprehensive Metabolic Panel (CMET) - TSH  5. Need for Tdap vaccination  - Tdap vaccine greater than or equal to 7yo IM   Return in about 4 months (around 12/12/2020).      I, [name], MD, have reviewed all documentation for this visit. The documentation on 08/17/20 for the exam, diagnosis,  procedures, and orders are all accurate and complete.    Marcellas Marchant Cranford Mon, MD  [facility] [phone] (phone) [phone] (fax)  [place]

## 2020-08-12 ENCOUNTER — Other Ambulatory Visit: Payer: Self-pay | Admitting: Family Medicine

## 2020-08-12 LAB — LIPID PANEL
Chol/HDL Ratio: 8.6 ratio — ABNORMAL HIGH (ref 0.0–5.0)
Cholesterol, Total: 257 mg/dL — ABNORMAL HIGH (ref 100–199)
HDL: 30 mg/dL — ABNORMAL LOW (ref >39)
LDL Chol Calc (NIH): 99 mg/dL (ref 0–99)
Triglycerides: 743 mg/dL (ref 0–149)
VLDL Cholesterol Cal: 128 mg/dL — ABNORMAL HIGH (ref 5–40)

## 2020-08-12 LAB — CBC WITH DIFFERENTIAL/PLATELET
Basophils Absolute: 0.1 10*3/uL (ref 0.0–0.2)
Basos: 1 %
EOS (ABSOLUTE): 0.2 10*3/uL (ref 0.0–0.4)
Eos: 2 %
Hematocrit: 48 % (ref 37.5–51.0)
Hemoglobin: 15.7 g/dL (ref 13.0–17.7)
Immature Grans (Abs): 0.1 10*3/uL (ref 0.0–0.1)
Immature Granulocytes: 1 %
Lymphocytes Absolute: 2.6 10*3/uL (ref 0.7–3.1)
Lymphs: 30 %
MCH: 28.8 pg (ref 26.6–33.0)
MCHC: 32.7 g/dL (ref 31.5–35.7)
MCV: 88 fL (ref 79–97)
Monocytes Absolute: 0.7 10*3/uL (ref 0.1–0.9)
Monocytes: 8 %
Neutrophils Absolute: 5 10*3/uL (ref 1.4–7.0)
Neutrophils: 58 %
Platelets: 212 10*3/uL (ref 150–450)
RBC: 5.45 x10E6/uL (ref 4.14–5.80)
RDW: 12.7 % (ref 11.6–15.4)
WBC: 8.5 10*3/uL (ref 3.4–10.8)

## 2020-08-12 LAB — COMPREHENSIVE METABOLIC PANEL
ALT: 46 IU/L — ABNORMAL HIGH (ref 0–44)
AST: 47 IU/L — ABNORMAL HIGH (ref 0–40)
Albumin/Globulin Ratio: 1.5 (ref 1.2–2.2)
Albumin: 4.7 g/dL (ref 3.8–4.9)
Alkaline Phosphatase: 73 IU/L (ref 44–121)
BUN/Creatinine Ratio: 23 — ABNORMAL HIGH (ref 9–20)
BUN: 23 mg/dL (ref 6–24)
Bilirubin Total: 0.6 mg/dL (ref 0.0–1.2)
CO2: 26 mmol/L (ref 20–29)
Calcium: 10 mg/dL (ref 8.7–10.2)
Chloride: 96 mmol/L (ref 96–106)
Creatinine, Ser: 1 mg/dL (ref 0.76–1.27)
Globulin, Total: 3.1 g/dL (ref 1.5–4.5)
Glucose: 125 mg/dL — ABNORMAL HIGH (ref 65–99)
Potassium: 4.1 mmol/L (ref 3.5–5.2)
Sodium: 136 mmol/L (ref 134–144)
Total Protein: 7.8 g/dL (ref 6.0–8.5)
eGFR: 89 mL/min/{1.73_m2} (ref >59)

## 2020-08-12 LAB — TSH: TSH: 1.7 u[IU]/mL (ref 0.450–4.500)

## 2020-08-12 LAB — HEMOGLOBIN A1C
Est. average glucose Bld gHb Est-mCnc: 200 mg/dL
Hgb A1c MFr Bld: 8.6 % — ABNORMAL HIGH (ref 4.8–5.6)

## 2020-08-12 NOTE — Telephone Encounter (Signed)
Requested medications are due for refill today.  yes  Requested medications are on the active medications list.  yes  Last refill. 09/06/2018  Future visit scheduled.   yes  Notes to clinic.  Medication not delegated. Prescription is expired.

## 2020-08-14 MED ORDER — BUTALBITAL-APAP-CAFFEINE 50-325-40 MG PO TABS: 1.0000 | ORAL_TABLET | Freq: Two times a day (BID) | ORAL | 4 refills | Status: DC | PRN

## 2020-09-14 ENCOUNTER — Other Ambulatory Visit: Payer: Self-pay | Admitting: Family Medicine

## 2020-10-01 ENCOUNTER — Other Ambulatory Visit: Payer: Self-pay | Admitting: Cardiology

## 2020-11-06 ENCOUNTER — Ambulatory Visit
Admission: EM | Admit: 2020-11-06 | Discharge: 2020-11-06 | Disposition: A | Discharge status: Home / Self Care | Payer: BC Managed Care – PPO | Attending: Emergency Medicine | Admitting: Emergency Medicine

## 2020-11-06 ENCOUNTER — Other Ambulatory Visit: Payer: Self-pay

## 2020-11-06 DIAGNOSIS — R52 Pain, unspecified: Secondary | ICD-10-CM | POA: Diagnosis not present

## 2020-11-06 DIAGNOSIS — R519 Headache, unspecified: Secondary | ICD-10-CM

## 2020-11-06 DIAGNOSIS — R197 Diarrhea, unspecified: Secondary | ICD-10-CM | POA: Diagnosis not present

## 2020-11-06 DIAGNOSIS — J029 Acute pharyngitis, unspecified: Secondary | ICD-10-CM | POA: Diagnosis not present

## 2020-11-06 DIAGNOSIS — B349 Viral infection, unspecified: Secondary | ICD-10-CM

## 2020-11-06 NOTE — ED Provider Notes (Signed)
[facility]    CSN: [id number] Arrival date & time: 11/06/20  0855      History   Chief Complaint Chief Complaint  Patient presents with   Sore Throat   Diarrhea   Nasal Congestion   Generalized Body Aches    HPI Robert Franklin is a 55 y.o. male.   Pt reports Wednesday he began sneezing and having a sore throat, nasal congestion, diarrhea, single episode of vomiting, headache and body aches.  Patient is afebrile at this time, vital signs are also normal.  Patient states that today he is actually feeling a little worse than before.  Patient denies cough at this time.  The history is provided by the patient.   Past Medical History:  Diagnosis Date   Coronary artery disease, non-occlusive 02/28/2008   Dr. Humphrey Rolls @ [place]: 2 x 40-50% lesions in dominant LCx lesion by cath,  EF 60% with normal wall motion.   Diabetes mellitus without complication ([place])    Essential hypertension    GERD (gastroesophageal reflux disease)    Headache    High triglycerides     was previously on treatment, but  he notes that this was stopped   Kidney stones    Moderate obesity 05/29/2014   OSA treated with BiPAP    Short bowel syndrome 1994    Patient Active Problem List   Diagnosis Date Noted   Worsening headaches 04/30/2019   Neck pain 04/30/2019   Bilateral occipital neuralgia 04/30/2019   Migraine without aura and without status migrainosus, not intractable 04/30/2019   Vertigo 04/30/2019   Abnormal nuclear stress test 02/07/2019   Atypical angina ([place]) 02/07/2019   Central adiposity 08/10/2018   DM (diabetes mellitus) ([place]) 07/08/2018   Medication management 01/01/2016   Hypertriglyceridemia without hypercholesterolemia 02/26/2015   Abnormally low high density lipoprotein (HDL) cholesterol with hypertriglyceridemia 02/26/2015   Atrial premature contractions 15/83/0940   Metabolic syndrome 76/80/8811   Rapid heart beat 02/26/2015   Signs and symptoms involving emotional  state 05/29/2014   Anxiety 05/29/2014   Adult BMI 30+ 05/29/2014   Clinical depression 05/29/2014   Failure of erection 05/29/2014   Abnormal LFTs 05/29/2014   Blood glucose elevated 05/29/2014   GERD (gastroesophageal reflux disease) 05/29/2014   Combined fat and carbohydrate induced hyperlipemia 05/29/2014   Essential hypertension 05/29/2014   Headache, migraine 05/29/2014   Obesity, Class I, BMI 30.0-34.9 (see actual BMI) 05/29/2014   Gastroduodenal ulcer 05/29/2014   Apnea, sleep 04/07/9456   Umbilical hernia without obstruction and without gangrene 05/29/2014   Avitaminosis D 09/14/2009   Arteriosclerosis of coronary artery 02/27/2009    Past Surgical History:  Procedure Laterality Date   "stomach wrap"  1991   GERD   APPENDECTOMY     CARDIAC CATHETERIZATION  02/2008   Dr. Humphrey Rolls @ [facility]: 2 x 40-50% lesions in a dominant LCx by cath,  EF 60% with normal wall motion.   Cardiolite Nuclear Stress Test   January 2010    heart rate increased from 80-158 BPM. No EKG changes.  there is a moderate sized  reversible defect in the inferior and inferior lateral wall as well as anteroseptal. EF 59%.   CardioNet Holter Monitor   January 2010    sinus rhythm. Rare PVCs in setting was. Current PACs , seen in singles and pairs. Max heart rate 127, minimum heart rate 57. Average 84.   CHOLECYSTECTOMY     COLON SURGERY     due to gangrenous appendix  COLONOSCOPY WITH PROPOFOL N/A 04/26/2016   Procedure: COLONOSCOPY WITH PROPOFOL;  Surgeon: Jonathon Bellows, MD;  Location: [facility] ENDOSCOPY;  Service: Endoscopy;  Laterality: N/A;   ESOPHAGOGASTRODUODENOSCOPY (EGD) WITH PROPOFOL N/A 01/11/2019   Procedure: ESOPHAGOGASTRODUODENOSCOPY (EGD) WITH PROPOFOL;  Surgeon: Jonathon Bellows, MD;  Location: [facility] ENDOSCOPY;  Service: Gastroenterology;  Laterality: N/A;   FLEXIBLE SIGMOIDOSCOPY N/A 01/11/2019   Procedure: FLEXIBLE SIGMOIDOSCOPY;  Surgeon: Jonathon Bellows, MD;  Location: [facility] ENDOSCOPY;  Service:  Gastroenterology;  Laterality: N/A;   HERNIA REPAIR  2010   INNER EAR SURGERY     LEFT HEART CATH AND CORONARY ANGIOGRAPHY N/A 02/12/2019   Procedure: LEFT HEART CATH AND CORONARY ANGIOGRAPHY;  Surgeon: Leonie Man, MD;  Location: [facility];  Service: Cardiovascular;  Laterality: N/A;   NM MYOVIEW LTD  01/10/2019   Treadmill Myoview: Walk 9:40 min-10.1 METS) EF ~45 to 50%.  INTERMEDIATE RISK STUDY-moderate sized mostly fixed perfusion defect in anterior apical segment.  No reversibility.  Suggests prior infarct without ischemia.   TONSILLECTOMY     TYMPANOSTOMY TUBE PLACEMENT Bilateral 1973       Home Medications    Prior to Admission medications   Medication Sig Start Date End Date Taking? Authorizing Provider  guaiFENesin (MUCINEX) 600 MG 12 hr tablet Take by mouth 2 (two) times daily.   Yes [provider]  bismuth subsalicylate (PEPTO BISMOL) 262 MG/15ML suspension Take 30 mLs by mouth every 6 (six) hours as needed for indigestion.    [provider]  butalbital-acetaminophen-caffeine (FIORICET) [phone] MG tablet Take 1 tablet by mouth 2 (two) times daily as needed for headache. 08/14/20   [name]., MD  butalbital-acetaminophen-caffeine (FIORICET) [phone] MG tablet TAKE 1 TABLET BY MOUTH 2 (TWO) TIMES DAILY AS NEEDED FOR HEADACHE. 08/14/20   [name]., MD  chlorthalidone (HYGROTON) 25 MG tablet Take 1 tablet (25 mg total) by mouth daily. WILL NEED AN APPOINTMENT 10/01/20   Leonie Man, MD  CINNAMON PO Take 1,200 mg by mouth daily.    [provider]  Homeopathic Products ([place]bina) FOAM Apply 1 application topically daily as needed (pain/cramps).    [provider]  hyoscyamine (LEVSIN) 0.125 MG tablet TAKE 1 TABLET BY MOUTH 4 TIMES A DAY AS NEEDED FOR PAIN 08/29/19   Jonathon Bellows, MD  losartan (COZAAR) 100 MG tablet TAKE 1 TABLET BY MOUTH EVERY DAY. TAKE WITH HYDROCHLOROTHIAZIDE 06/15/20   [name]., MD  Magnesium 100 MG CAPS Take 200 mg by mouth daily.    [provider]  Menthol, Topical Analgesic, (BIOFREEZE EX) Apply 1 application topically daily as needed (pain).    [provider]  metFORMIN (GLUCOPHAGE) 1000 MG tablet TAKE 1 TABLET (1,000 MG TOTAL) BY MOUTH 2 (TWO) TIMES DAILY WITH A MEAL. 09/14/20   [name]., MD  metoprolol succinate (TOPROL-XL) 100 MG 24 hr tablet TAKE 1 TABLET (100 MG TOTAL) BY MOUTH DAILY. TAKE WITH OR IMMEDIATELY FOLLOWING A MEAL. 01/14/20   Leonie Man, MD  omeprazole (PRILOSEC OTC) 20 MG tablet Take 1 tablet (20 mg total) by mouth daily. 05/14/15   [name]., MD  pioglitazone (ACTOS) 30 MG tablet Take 1 tablet (30 mg total) by mouth daily. 04/09/20   [name]., MD  Saw Palmetto, Serenoa repens, (SAW PALMETTO PO) Take 500 mg by mouth daily.    [provider]  Simethicone 125 MG CAPS Take 125 mg by mouth  daily as needed (gas).    [provider]  tadalafil (CIALIS) 20 MG tablet Take 1 tablet (20 mg total) by mouth every three (3) days as needed for erectile dysfunction. 01/30/20   [name]., MD  vitamin C (ASCORBIC ACID) 250 MG tablet Take 1,000 mg by mouth daily.    [provider]  cholestyramine (QUESTRAN) 4 g packet TAKE 1 PACKET (4 G TOTAL) BY MOUTH 2 (TWO) TIMES DAILY. 10/14/19 02/11/20  Jonathon Bellows, MD  fenofibrate 160 MG tablet Take 1 tablet (160 mg total) by mouth daily. 02/14/18 02/11/20  Leonie Man, MD  imipramine (TOFRANIL) 25 MG tablet TAKE 1 TABLET BY MOUTH EVERYDAY AT BEDTIME. Call office at [phone] to schedule follow up for ongoing refills. 11/19/19 02/11/20  Sater, Nanine Means, MD  rosuvastatin (CRESTOR) 20 MG tablet TAKE 1 TABLET BY MOUTH EVERY DAY 05/28/19 02/11/20  Leonie Man, MD  VASCEPA 1 g capsule TAKE 2 CAPSULES (2 G TOTAL) BY MOUTH 2 (TWO) TIMES DAILY. 02/05/19 02/11/20  Leonie Man, MD    Family History Family History   Problem Relation Age of Onset   Hypertension Mother    Irritable bowel syndrome Mother    Migraines Mother    Lumbar disc disease Mother    Heart attack Mother    Heart attack Maternal Uncle    Heart attack Maternal Grandfather     Social History Social History   Tobacco Use   Smoking status: Former    Packs/day: 1.00    Years: 28.00    Pack years: 28.00    Types: Cigarettes    Quit date: 12/30/2005    Years since quitting: 14.8   Smokeless tobacco: Never  Substance Use Topics   Alcohol use: Yes    Comment: Once or twice a year   Drug use: Not Currently    Types: Marijuana    Comment: Quit in 1982     Allergies   Patient has no active allergies.   Review of Systems Review of Systems Pertinent findings noted in history of present illness.    Physical Exam Triage Vital Signs ED Triage Vitals  Enc Vitals Group     BP      Pulse      Resp      Temp      Temp src      SpO2      Weight      Height      Head Circumference      Peak Flow      Pain Score      Pain Loc      Pain Edu?      Excl. in [place]?    No data found.  Updated Vital Signs BP 132/86 (BP Location: Left Arm)   Pulse 73   Temp 98.6 F (37 C) (Oral)   Resp 20   Wt 230 lb 14.4 oz (104.7 kg)   SpO2 97%   BMI 34.10 kg/m   Visual Acuity Right Eye Distance:   Left Eye Distance:   Bilateral Distance:    Right Eye Near:   Left Eye Near:    Bilateral Near:     Physical Exam Vitals and nursing note reviewed.  Constitutional:      General: He is not in acute distress.    Appearance: Normal appearance. He is not ill-appearing, toxic-appearing or diaphoretic.  HENT:     Head: Normocephalic and atraumatic.     Right  Ear: Tympanic membrane, ear canal and external ear normal.     Left Ear: Tympanic membrane, ear canal and external ear normal.     Nose: Nose normal.     Mouth/Throat:     Mouth: Mucous membranes are moist.     Pharynx: Oropharynx is clear.  Eyes:     Extraocular  Movements: Extraocular movements intact.     Conjunctiva/sclera: Conjunctivae normal.     Pupils: Pupils are equal, round, and reactive to light.  Cardiovascular:     Rate and Rhythm: Normal rate and regular rhythm.     Heart sounds: Normal heart sounds.  Pulmonary:     Effort: Pulmonary effort is normal. No respiratory distress.     Breath sounds: Normal breath sounds. No wheezing, rhonchi or rales.  Abdominal:     General: Abdomen is flat. Bowel sounds are normal.     Palpations: Abdomen is soft.  Musculoskeletal:        General: Normal range of motion.     Cervical back: Normal range of motion and neck supple. No tenderness.  Lymphadenopathy:     Cervical: No cervical adenopathy.  Skin:    General: Skin is warm and dry.  Neurological:     General: No focal deficit present.     Mental Status: He is alert and oriented to person, place, and time.  Psychiatric:        Mood and Affect: Mood normal.        Behavior: Behavior normal.     UC Treatments / Results  Labs (all labs ordered are listed, but only abnormal results are displayed) Labs Reviewed  COVID-19, FLU A+B NAA    EKG   Radiology No results found.  Procedures Procedures (including critical care time)  Medications Ordered in UC Medications - No data to display  Initial Impression / Assessment and Plan / UC Course  I have reviewed the triage vital signs and the nursing notes.  Pertinent labs & imaging results that were available during my care of the patient were reviewed by me and considered in my medical decision making (see chart for details).     Physical exam today is unremarkable.  At that we are happy to test him for COVID and influenza today and that the results will back in the next 12 to 24 hours.  I also recommended that patient consider himself contagious until he finds out the results of his tests.  Continued conservative care is recommended.  Patient advised to follow-up with worsening  symptoms.  Patient verbalized understanding and agreement of plan as discussed.  All questions were addressed during visit.  Please see discharge instructions below for further details of plan.  Final Clinical Impressions(s) / UC Diagnoses   Final diagnoses:  Acute pharyngitis, unspecified etiology  Diarrhea, unspecified type  Nonintractable headache, unspecified chronicity pattern, unspecified headache type  Generalized body aches  Viral illness     Discharge Instructions      You will be notified of the results of your COVID and flu testing once they are received, use is usually within 24 hours.  I have added some information about conservative care of viral illnesses well as some information about a good t immune system booster called Echinacea.    Rest, clear fluids, nonsteroidal anti-inflammatory such as ibuprofen or naproxen for pain and body aches, acetaminophen for fever or recommended, please take all at recommended dose on the bottle.    Please follow-up if your symptoms do not  improve by Wednesday of next week.  I see in your chart that you are a type II diabetic.  Please keep in mind that one of the reasons your doctors suggesting you become immunized for influenza and possibly receive a pneumonia booster is because of your diabetes.  There is very good data to support vaccination for pneumonia in patients younger than 74 who are diabetic because your risk of hospitalization with pneumonia is significantly higher than the nondiabetic population.  I hope you will give this some further consideration.  Thank you for visiting urgent care, I hope you feel better soon.     ED Prescriptions   None    PDMP not reviewed this encounter.   Lynden Oxford Scales, PA-C 11/06/20 1306

## 2020-11-06 NOTE — ED Triage Notes (Signed)
Pt reports Wednesday he began having a sneeze yesterday he began having a sore throat, nasal congestion, diarrhea, vomiting, headache and body aches.

## 2020-11-06 NOTE — Discharge Instructions (Addendum)
You will be notified of the results of your COVID and flu testing once they are received, use is usually within 24 hours.  I have added some information about conservative care of viral illnesses well as some information about a good t immune system booster called Echinacea.    Rest, clear fluids, nonsteroidal anti-inflammatory such as ibuprofen or naproxen for pain and body aches, acetaminophen for fever or recommended, please take all at recommended dose on the bottle.    Please follow-up if your symptoms do not improve by Wednesday of next week.  I see in your chart that you are a type II diabetic.  Please keep in mind that one of the reasons your doctors suggesting you become immunized for influenza and possibly receive a pneumonia booster is because of your diabetes.  There is very good data to support vaccination for pneumonia in patients younger than 65 who are diabetic because your risk of hospitalization with pneumonia is significantly higher than the nondiabetic population.  I hope you will give this some further consideration.  Thank you for visiting urgent care, I hope you feel better soon.

## 2020-11-07 LAB — COVID-19, FLU A+B NAA
Influenza A, NAA: NOT DETECTED
Influenza B, NAA: NOT DETECTED
SARS-CoV-2, NAA: NOT DETECTED

## 2020-12-13 ENCOUNTER — Other Ambulatory Visit: Payer: Self-pay | Admitting: Family Medicine

## 2020-12-13 NOTE — Telephone Encounter (Signed)
Requested medication (s) are due for refill today: yes  Requested medication (s) are on the active medication list: yes  Last refill:  09/14/20 #180  Future visit scheduled: yes  Notes to clinic:  Failed Hgb A1C has upcoming office visit   Requested Prescriptions  Pending Prescriptions Disp Refills   metFORMIN (GLUCOPHAGE) 1000 MG tablet [Pharmacy Med Name: METFORMIN HCL 1,000 MG TABLET] 180 tablet 0    Sig: TAKE 1 TABLET (1,000 MG TOTAL) BY MOUTH 2 (TWO) TIMES DAILY WITH A MEAL.     Endocrinology:  Diabetes - Biguanides Failed - 12/13/2020  3:27 PM      Failed - HBA1C is between 0 and 7.9 and within 180 days    Hgb A1c MFr Bld  Date Value Ref Range Status  08/11/2020 8.6 (H) 4.8 - 5.6 % Final    Comment:             Prediabetes: 5.7 - 6.4          Diabetes: >6.4          Glycemic control for adults with diabetes: <7.0           Passed - Cr in normal range and within 360 days    Creatinine  Date Value Ref Range Status  01/29/2013 1.01 0.60 - 1.30 mg/dL Final   Creatinine, Ser  Date Value Ref Range Status  08/11/2020 1.00 0.76 - 1.27 mg/dL Final          Passed - eGFR in normal range and within 360 days    EGFR (African American)  Date Value Ref Range Status  01/29/2013 >60  Final   GFR calc Af Amer  Date Value Ref Range Status  10/10/2019 84 >59 mL/min/1.73 Final    Comment:    **[facility] currently reports eGFR in compliance with the current**   recommendations of the [employer]. [facility] will   update reporting as new guidelines are published from the NKF-ASN   Task force.    EGFR (Non-African Amer.)  Date Value Ref Range Status  01/29/2013 >60  Final    Comment:    eGFR values <33mL/min/1.73 m2 may be an indication of chronic kidney disease (CKD). Calculated eGFR is useful in patients with stable renal function. The eGFR calculation will not be reliable in acutely ill patients when serum creatinine is changing rapidly. It is not useful in   patients on dialysis. The eGFR calculation may not be applicable to patients at the low and high extremes of body sizes, pregnant women, and vegetarians.    GFR calc non Af Amer  Date Value Ref Range Status  10/10/2019 72 >59 mL/min/1.73 Final   eGFR  Date Value Ref Range Status  08/11/2020 89 >59 mL/min/1.73 Final          Passed - Valid encounter within last 6 months    Recent Outpatient Visits           4 months ago Type 2 diabetes mellitus without complication, without long-term current use of insulin [facility])   [facility] [name]., MD   8 months ago Type 2 diabetes mellitus without complication, without long-term current use of insulin [facility])   [facility] [name]., MD   1 year ago Annual physical exam   [facility] [name]., MD   1 year ago Type 2 diabetes mellitus without complication, without long-term current use of insulin [facility])   [facility] [name][facility] MD   1 year ago Type 2 diabetes mellitus without complication, without long-term current use of insulin [facility])   [facility] [name]., MD       Future Appointments             In 1 week [name]., MD [facility], [place]   In 1 month Ellyn Hack Leonie Green, MD [facility], [facility]

## 2020-12-16 ENCOUNTER — Other Ambulatory Visit: Payer: Self-pay | Admitting: Family Medicine

## 2020-12-16 DIAGNOSIS — N529 Male erectile dysfunction, unspecified: Secondary | ICD-10-CM

## 2020-12-16 NOTE — Telephone Encounter (Signed)
Requested Prescriptions  Pending Prescriptions Disp Refills  . tadalafil (CIALIS) 20 MG tablet [Pharmacy Med Name: TADALAFIL 20 MG TABLET] 6 tablet 9    Sig: TAKE 1 TABLET (20 MG TOTAL) BY MOUTH EVERY THREE (3) DAYS AS NEEDED FOR ERECTILE DYSFUNCTION.     Urology: Erectile Dysfunction Agents Passed - 12/16/2020  9:59 AM      Passed - Last BP in normal range    BP Readings from Last 1 Encounters:  11/06/20 132/86         Passed - Valid encounter within last 12 months    Recent Outpatient Visits          4 months ago Type 2 diabetes mellitus without complication, without long-term current use of insulin [facility])   [facility] [name]., MD   8 months ago Type 2 diabetes mellitus without complication, without long-term current use of insulin [facility])   [facility] [name]., MD   1 year ago Annual physical exam   [facility] [name]., MD   1 year ago Type 2 diabetes mellitus without complication, without long-term current use of insulin [facility])   [facility] [name]., MD   1 year ago Type 2 diabetes mellitus without complication, without long-term current use of insulin [facility])   [facility] [name]., MD      Future Appointments            In 1 month Ellyn Hack Leonie Green, MD [facility], [facility]   In 3 months [name]., MD [facility], [place]

## 2020-12-19 ENCOUNTER — Other Ambulatory Visit: Payer: Self-pay | Admitting: Family Medicine

## 2020-12-23 ENCOUNTER — Ambulatory Visit: Payer: Self-pay | Admitting: Family Medicine

## 2021-01-03 ENCOUNTER — Other Ambulatory Visit: Payer: Self-pay | Admitting: Cardiology

## 2021-01-27 ENCOUNTER — Encounter: Payer: Self-pay | Admitting: Cardiology

## 2021-01-27 ENCOUNTER — Other Ambulatory Visit: Payer: Self-pay

## 2021-01-27 ENCOUNTER — Ambulatory Visit (INDEPENDENT_AMBULATORY_CARE_PROVIDER_SITE_OTHER): Payer: BC Managed Care – PPO | Admitting: Cardiology

## 2021-01-27 VITALS — BP 130/88 | HR 79 | Ht 69.0 in | Wt 215.4 lb

## 2021-01-27 DIAGNOSIS — I2510 Atherosclerotic heart disease of native coronary artery without angina pectoris: Secondary | ICD-10-CM | POA: Diagnosis not present

## 2021-01-27 DIAGNOSIS — E781 Pure hyperglyceridemia: Secondary | ICD-10-CM

## 2021-01-27 DIAGNOSIS — E669 Obesity, unspecified: Secondary | ICD-10-CM

## 2021-01-27 DIAGNOSIS — I491 Atrial premature depolarization: Secondary | ICD-10-CM | POA: Diagnosis not present

## 2021-01-27 DIAGNOSIS — E782 Mixed hyperlipidemia: Secondary | ICD-10-CM | POA: Diagnosis not present

## 2021-01-27 DIAGNOSIS — I10 Essential (primary) hypertension: Secondary | ICD-10-CM

## 2021-01-27 DIAGNOSIS — E786 Lipoprotein deficiency: Secondary | ICD-10-CM

## 2021-01-27 MED ORDER — ROSUVASTATIN CALCIUM 20 MG PO TABS: 20.0000 mg | ORAL_TABLET | Freq: Every day | ORAL | 3 refills | Status: DC

## 2021-01-27 MED ORDER — VASCEPA 1 G PO CAPS: 2.0000 g | ORAL_CAPSULE | Freq: Two times a day (BID) | ORAL | 3 refills | Status: DC

## 2021-01-27 NOTE — Patient Instructions (Addendum)
Medication Instructions:   START ROSUVASTATIN 20 MG DAILY VASCEPA 2 GRAM  2 TABLET TWICE A DAY     *If you need a refill on your cardiac medications before your next appointment, please call your pharmacy*   Lab Work:  [place] 4 MONTHS -- MAY 2023  LIPID -CMP   If you have labs (blood work) drawn today and your tests are completely normal, you will receive your results only by: [place] (if you have MyChart) OR A paper copy in the mail If you have any lab test that is abnormal or we need to change your treatment, we will call you to review the results.   Testing/Procedures: NOT NEEDED   Follow-Up: At [facility], you and your health needs are our priority.  As part of our continuing mission to provide you with exceptional heart care, we have created designated Provider Care Teams.  These Care Teams include your primary Cardiologist (physician) and Advanced Practice Providers (APPs -  Physician Assistants and Nurse Practitioners) who all work together to provide you with the care you need, when you need it.     Your next appointment:   6 month(s)  The format for your next appointment:   In Person  Provider:    K.LAWRENCE DNP, J LAMBERT -PAC , OR CALLIE [place] PA  Your physician recommends that you schedule a follow-up appointment in:  [place]

## 2021-01-27 NOTE — Progress Notes (Signed)
Primary Care Provider: [name]., MD Cardiologist: Glenetta Hew, MD Lipidologist/Cardiologist: Dr. Debara Pickett Electrophysiologist: None  Clinic Note: Chief Complaint  Patient presents with   Follow-up    Essentially 2 years-had insurance issues.   Coronary Artery Disease    Nonocclusive by recent catheterization.  No angina.   Dyslipidemia    Most notably hyper-triglyceridemia; unfortunately no longer on statin or Vascepa.  Dietitian insurance issues)   ===================================  ASSESSMENT/PLAN   Problem List Items Addressed This Visit       Cardiology Problems   Arteriosclerosis of coronary artery (Chronic)    Mild to moderate disease confirmed.  The circumflex actually was very patulous and it does not seem like lesions were truly 40 to 50% stenoses, more consistent with ectasia and aneurysmal segments with intervening normal diameter segments.  Reinitiate aggressive lipid management.  Continue current blood pressure control.  He should be on aspirin which is not listed.      Relevant Medications   VASCEPA 1 g capsule   rosuvastatin (CRESTOR) 20 MG tablet   Essential hypertension (Chronic)    Borderline pressures today on chlorthalidone and losartan along with Toprol.  Stable doses.  Refill      Relevant Medications   VASCEPA 1 g capsule   rosuvastatin (CRESTOR) 20 MG tablet   Other Relevant Orders   EKG 12-Lead (Completed)   Hypercholesterolemia with hypertriglyceridemia - Primary (Chronic)    Unfortunately he is no longer on rosuvastatin and Vascepa.  He had also been on a fenofibrate. Triglycerides from July of last year were very poorly controlled.  This is in conjunction with his glycemic control being out of control.  Plan for now will be to restart rosuvastatin along with Vascepa 2 g twice daily. Recheck labs in 3 to 4 months and then reassess whether needs to be back on fenofibrate as well.  He is now on pioglitazone which is likely not  the best option for diabetes, would consider possibly SGLT2 inhibitor versus GLP-1 agonist.  He had urination issues BCL2 in the past, but that has been quite some time.      Relevant Medications   VASCEPA 1 g capsule   rosuvastatin (CRESTOR) 20 MG tablet   Other Relevant Orders   EKG 12-Lead (Completed)   Abnormally low high density lipoprotein (HDL) cholesterol with hypertriglyceridemia (Chronic)    Restart statin and Vascepa.  Reassess in 3 to 4 months, may still need to restart fenofibrate.      Relevant Medications   VASCEPA 1 g capsule   rosuvastatin (CRESTOR) 20 MG tablet   Atrial premature contractions (Chronic)    Doing well.  Relatively asymptomatic on current dose of beta-blocker.      Relevant Medications   VASCEPA 1 g capsule   rosuvastatin (CRESTOR) 20 MG tablet     Other   Obesity, Class I, BMI 30.0-34.9 (see actual BMI) (Chronic)    He lost quite a bit of weight now.  Feels much better.  Unfortunately still having issues with glycemic and lipid control.  Is open to start doing more exercise which would hopefully improve blood pressure, glycemic and lipid control.      Other Visit Diagnoses     Coronary artery disease involving native coronary artery of native heart without angina pectoris       Relevant Medications   VASCEPA 1 g capsule   rosuvastatin (CRESTOR) 20 MG tablet   Other Relevant Orders   EKG 12-Lead (Completed)       ===================================  HPI:    Robert Franklin is a formally morbidly obese 56 y.o. male with a PMH notable for OSA (on CPAP-improved with weight loss), history of nonobstructive CAD by Cath (and Nonischemic Myoview), Dyslipidemia with Hypertriglyceridemia (originally referred to lipid clinic with Dr. Debara Pickett) who presents today for 2-year follow-up having reestablished stable insurance..  I last saw Robert Franklin on February 07, 2019 in follow-up from her nuclear stress test to evaluate chest pain  symptoms started back in December 2020 when he was seen by Robert Sims, NP.  He also had significant elevated blood pressures.  Myoview was read as intermediate risk because of what was likely anteroapical artifact with EF 45 to 50%.  There was a fixed defect suggestive of prior circumflex distribution infarct. => He was therefore taken for cardiac catheterization on February 12, 2019 revealing no significant lesions in the circumflex, just some diffuse ectasia and moderate 40 to 50% stenoses in the circumflex and RCA.  EF was normal.  Suggested possible calcium channel blocker for possible spasm.  He was seen post-cath follow-up on February 27, 2019 by Robert Sims, NP.  At that point he had no cardiac complaints was still having some neck pain and headaches as well as abdominal pain followed by GI.  Was happy to hear about his coronary findings.  Recent Hospitalizations: N/A  Reviewed  CV studies:    The following studies were reviewed today: (if available, images/films reviewed: From Epic Chart or Care Everywhere) Cardiac Catheterization 02/12/2019: Prox RCA lesion is 40% stenosed.  Diffuse aneurysmal/ectatic dominant circumflex with no significant lesions. What was considered to be 40 to 50% lesions are probably just normal diameter with surrounding ectasia and aneurysmal segments. The left ventricular systolic function is normal. The left ventricular ejection fraction is 55-65% by visual estimate.  LV end diastolic pressure is normal. There is no aortic valve stenosis. LIKELY FALSE POSITIVE STRESS TEST: No significant coronary stenosis noted.  Interval History:   Robert Franklin presents here today now having establish new insurance.  Unfortunately, his old insurance is having labs he was not able to continue Vascepa and statin.  He also says that despite the fact that he is lost about 50 pounds from when I first tried seeing him, his blood sugars have been pretty much out-of-control.   His blood sugars been up in the 300s.  He has made a dramatic changes to his diet and has plans to start exercise program.  He actually has a treadmill be delivered to his house this week.  He actually asked about how to get into an exercise regimen.  He actually has been clinically stable from a cardiac standpoint with no chest pain or pressure with rest or exertion.  He is had some musculoskeletal aches and pains in his back and shoulders and upper chest, but nothing to suggest angina. Actually able to exercise and walk being more limited by MSK pain that they chest pain or dyspnea.  Mild end of day edema but no PND orthopnea. He has not been using CPAP, but has not had the daytime fatigue because of the weight loss.  No palpitations, syncope or near syncope.  No TIA or orthostatic symptoms.  No claudication.  REVIEWED OF SYSTEMS   Review of Systems  Constitutional:  Positive for weight loss (Significant weight loss.  Intentional). Negative for malaise/fatigue.  HENT:  Negative for congestion and nosebleeds.   Respiratory:  Negative for cough and shortness of breath.   Cardiovascular:  Per HPI  Gastrointestinal:  Negative for blood in stool and melena.  Genitourinary:  Negative for hematuria.  Musculoskeletal:  Positive for back pain, joint pain and myalgias. Negative for falls.       Aches and pains in his shoulders, back and hips.  Neurological:  Negative for dizziness, focal weakness and headaches.  Psychiatric/Behavioral:  Negative for depression and memory loss. The patient is not nervous/anxious and does not have insomnia.    I have reviewed and (if needed) personally updated the patient's problem list, medications, allergies, past medical and surgical history, social and family history.   PAST MEDICAL HISTORY   Past Medical History:  Diagnosis Date   Coronary artery disease, non-occlusive 02/28/2008   Dr. Humphrey Rolls @ [facility]: 2 x 40-50% lesions in dominant LCx lesion by cath,  EF  60% with normal wall motion.;  Follow catheterization January 2021 showed stable findings.  LCx lesions likely not stenoses, more normal diameter vessel in setting of diffuse ectasia/aneurysm.   Diabetes mellitus without complication ([place])    Essential hypertension    GERD (gastroesophageal reflux disease)    Headache    High triglycerides     was previously on treatment, but  he notes that this was stopped   Kidney stones    Moderate obesity 05/29/2014   OSA treated with BiPAP    Short bowel syndrome 1994    PAST SURGICAL HISTORY   Past Surgical History:  Procedure Laterality Date   "stomach wrap"  1991   GERD   APPENDECTOMY     CARDIAC CATHETERIZATION  02/2008   Dr. Humphrey Rolls @ [facility]: 2 x 40-50% lesions in a dominant LCx by cath,  EF 60% with normal wall motion.   Cardiolite Nuclear Stress Test  01/2008    heart rate increased from 80-158 BPM. No EKG changes.  there is a moderate sized  reversible defect in the inferior and inferior lateral wall as well as anteroseptal. EF 59%.   CardioNet Holter Monitor  01/2008    sinus rhythm. Rare PVCs in setting was. Current PACs , seen in singles and pairs. Max heart rate 127, minimum heart rate 57. Average 84.   CHOLECYSTECTOMY     COLON SURGERY     due to gangrenous appendix   COLONOSCOPY WITH PROPOFOL N/A 04/26/2016   Procedure: COLONOSCOPY WITH PROPOFOL;  Surgeon: Jonathon Bellows, MD;  Location: [facility] ENDOSCOPY;  Service: Endoscopy;  Laterality: N/A;   ESOPHAGOGASTRODUODENOSCOPY (EGD) WITH PROPOFOL N/A 01/11/2019   Procedure: ESOPHAGOGASTRODUODENOSCOPY (EGD) WITH PROPOFOL;  Surgeon: Jonathon Bellows, MD;  Location[facility] ENDOSCOPY;  Service: Gastroenterology;  Laterality: N/A;   FLEXIBLE SIGMOIDOSCOPY N/A 01/11/2019   Procedure: FLEXIBLE SIGMOIDOSCOPY;  Surgeon: Jonathon Bellows, MD;  Location: [facility] ENDOSCOPY;  Service: Gastroenterology;  Laterality: N/A;   HERNIA REPAIR  2010   INNER EAR SURGERY     LEFT HEART CATH AND CORONARY ANGIOGRAPHY N/A 02/12/2019    Procedure: LEFT HEART CATH AND CORONARY ANGIOGRAPHY;  Surgeon: Leonie Man, MD;  Location: [facility];  Service: Cardiovascular; Proximal RCA 40%.  Diffuse aneurysmal/ectatic (patulous) dominant LCx with no significant lesions.  Questionable 40 to 50% stenosis-most likely actually just normal diameter with surrounding ectasia/aneurysmal segments.  EF 55 to 60%.  Normal EDP.   NM MYOVIEW LTD  01/10/2019   Treadmill Myoview: Walk 9:40 min-10.1 METS) EF ~45 to 50%.  INTERMEDIATE RISK STUDY-moderate sized mostly fixed perfusion defect in anterior apical segment.  No reversibility.  Suggests prior infarct without ischemia.   TONSILLECTOMY  TYMPANOSTOMY TUBE PLACEMENT Bilateral 1973     Immunization History  Administered Date(s) Administered   Tdap 01/28/2010, 08/11/2020    MEDICATIONS/ALLERGIES   Current Meds  Medication Sig   bismuth subsalicylate (PEPTO BISMOL) 262 MG/15ML suspension Take 30 mLs by mouth every 6 (six) hours as needed for indigestion.   butalbital-acetaminophen-caffeine (FIORICET) 50-325-40 MG tablet TAKE 1 TABLET BY MOUTH 2 (TWO) TIMES DAILY AS NEEDED FOR HEADACHE.   chlorthalidone (HYGROTON) 25 MG tablet TAKE 1 TABLET (25 MG TOTAL) BY MOUTH DAILY. WILL NEED AN APPOINTMENT   CINNAMON PO Take 1,200 mg by mouth daily.   guaiFENesin (MUCINEX) 600 MG 12 hr tablet Take by mouth 2 (two) times daily.   Homeopathic Products (THERAWORX RELIEF) FOAM Apply 1 application topically daily as needed (pain/cramps).   hyoscyamine (LEVSIN) 0.125 MG tablet TAKE 1 TABLET BY MOUTH 4 TIMES A DAY AS NEEDED FOR PAIN   losartan (COZAAR) 100 MG tablet TAKE 1 TABLET BY MOUTH EVERY DAY. TAKE WITH HYDROCHLOROTHIAZIDE   Magnesium 100 MG CAPS Take 200 mg by mouth daily.   Menthol, Topical Analgesic, (BIOFREEZE EX) Apply 1 application topically daily as needed (pain).   metFORMIN (GLUCOPHAGE) 1000 MG tablet TAKE 1 TABLET (1,000 MG TOTAL) BY MOUTH 2 (TWO) TIMES DAILY WITH A MEAL.    metoprolol succinate (TOPROL-XL) 100 MG 24 hr tablet TAKE 1 TABLET (100 MG TOTAL) BY MOUTH DAILY. TAKE WITH OR IMMEDIATELY FOLLOWING A MEAL.   omeprazole (PRILOSEC OTC) 20 MG tablet Take 1 tablet (20 mg total) by mouth daily.   pioglitazone (ACTOS) 30 MG tablet Take 1 tablet (30 mg total) by mouth daily.   rosuvastatin (CRESTOR) 20 MG tablet Take 1 tablet (20 mg total) by mouth daily.   Saw Palmetto, Serenoa repens, (SAW PALMETTO PO) Take 1,400 mg by mouth daily.   Simethicone 125 MG CAPS Take 125 mg by mouth daily as needed (gas).   tadalafil (CIALIS) 20 MG tablet TAKE 1 TABLET (20 MG TOTAL) BY MOUTH EVERY THREE (3) DAYS AS NEEDED FOR ERECTILE DYSFUNCTION.   VASCEPA 1 g capsule Take 2 capsules (2 g total) by mouth 2 (two) times daily.   vitamin C (ASCORBIC ACID) 250 MG tablet Take 1,000 mg by mouth daily.    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   Caffeine use: once or twice a week   Lives with wife, mother and grandmother    OBJCTIVE -PE, EKG, labs   Wt Readings from Last 3 Encounters:  01/27/21 215 lb 6.4 oz (97.7 kg)  11/06/20 230 lb 14.4 oz (104.7 kg)  08/11/20 235 lb (106.6 kg)    Physical Exam: BP 130/88 (BP Location: Right Arm, Patient Position: Sitting, Cuff Size: Normal)    Pulse 79  Ht 5\' 9"  (1.753 m)    Wt 215 lb 6.4 oz (97.7 kg)    SpO2 95%    BMI 31.81 kg/m  Physical Exam Vitals  reviewed.  Constitutional:      Appearance: Normal appearance. He is obese.     Comments: Obese, but notably lower weight.  Well-groomed.  HENT:     Head: Normocephalic and atraumatic.  Eyes:     Extraocular Movements: Extraocular movements intact.     Conjunctiva/sclera: Conjunctivae normal.     Pupils: Pupils are equal, round, and reactive to light.     Comments: Wears glasses.  Neck:     Vascular: No carotid bruit or JVD.  Cardiovascular:     Rate and Rhythm: Normal rate and regular rhythm. No extrasystoles are present.    Chest Wall: PMI is not displaced.     Pulses: Normal pulses.     Heart sounds: S1 normal and S2 normal. Heart sounds are distant. No murmur heard.   No friction rub. No gallop.  Pulmonary:     Effort: Pulmonary effort is normal. No respiratory distress.     Breath sounds: Normal breath sounds.  Chest:     Chest wall: No tenderness.  Musculoskeletal:        General: No swelling. Normal range of motion.     Cervical back: Normal range of motion and neck supple.  Skin:    General: Skin is warm and dry.  Neurological:     General: No focal deficit present.     Mental Status: He is alert and oriented to person, place, and time.  Psychiatric:        Mood and Affect: Mood normal.        Thought Content: Thought content normal.        Judgment: Judgment normal.    Adult ECG Report  Rate: 79 ;  Rhythm:  Normal sinus rhythm, normal axis, intervals durations. ;   Narrative Interpretation: Stable/normal  Recent Labs: Reviewed.  Should be due for labs and recheck soon. Lab Results  Component Value Date   CHOL 257 (H) 08/11/2020   HDL 30 (L) 08/11/2020   LDLCALC 99 08/11/2020   LDLDIRECT 46 07/05/2018   TRIG 743 (HH) 08/11/2020   CHOLHDL 8.6 (H) 08/11/2020   Lab Results  Component Value Date   CREATININE 1.00 08/11/2020   BUN 23 08/11/2020   NA 136 08/11/2020   K 4.1 08/11/2020   CL 96 08/11/2020   CO2 26 08/11/2020   CBC Latest Ref Rng & Units  08/11/2020 10/10/2019 02/07/2019  WBC 3.4 - 10.8 x10E3/uL 8.5 8.2 9.5  Hemoglobin 13.0 - 17.7 g/dL 15.7 15.0 15.5  Hematocrit 37.5 - 51.0 % 48.0 42.3 42.6  Platelets 150 - 450 x10E3/uL 212 220 213    Lab Results  Component Value Date   HGBA1C 8.6 (H) 08/11/2020   Lab Results  Component Value Date   TSH 1.700 08/11/2020    ==================================================  COVID-19 Education: The signs and symptoms of COVID-19 were discussed with the patient and how to seek care for testing (follow up with PCP or arrange E-visit).    I spent a total of 21 minutes with the patient spent in direct patient consultation.  Additional time spent with chart review  / charting (studies, outside notes, etc): 16 min Total Time: 37 min.  Current medicines are reviewed at length with the patient today.  (+/- concerns) none  This visit occurred during the SARS-CoV-2 public health emergency.  Safety protocols were in place, including screening questions prior to the visit, additional usage of staff PPE, and extensive cleaning of exam room while observing appropriate contact time as indicated for disinfecting solutions.  Notice: This dictation was prepared with Dragon dictation along with smart phrase technology. Any transcriptional errors that result from this process are unintentional and may not be corrected upon review.  Studies Ordered:   Orders Placed This Encounter  Procedures   EKG 12-Lead    Patient Instructions / Medication Changes & Studies & Tests Ordered   Patient Instructions  Medication Instructions:   START ROSUVASTATIN 20 MG DAILY VASCEPA 2 GRAM  2 TABLET TWICE A DAY     *If you need a refill on your cardiac medications before your next appointment, please call your pharmacy*   Lab Work:  [place] 4 MONTHS -- MAY 2023  LIPID -CMP   If you have labs (blood work) drawn today and your tests are completely normal, you will receive your results only  by: [place] (if you have MyChart) OR A paper copy in the mail If you have any lab test that is abnormal or we need to change your treatment, we will call you to review the results.   Testing/Procedures: NOT NEEDED   Follow-Up: At [facility], you and your health needs are our priority.  As part of our continuing mission to provide you with exceptional heart care, we have created designated Provider Care Teams.  These Care Teams include your primary Cardiologist (physician) and Advanced Practice Providers (APPs -  Physician Assistants and Nurse Practitioners) who all work together to provide you with the care you need, when you need it.     Your next appointment:   6 month(s)  The format for your next appointment:   In Person  Provider:    K.LAWRENCE DNP, J LAMBERT -PAC , Aubrey PA  Your physician recommends that you schedule a follow-up appointment in:  85MONTH WITH dR Giulliana Mcroberts        Glenetta Hew, M.D., M.S. Interventional Cardiologist   Pager # [phone] Phone # [phone] [address] [place], [place] [address]   Thank you for choosing Heartcare at [facility]!!

## 2021-01-29 ENCOUNTER — Other Ambulatory Visit: Payer: Self-pay

## 2021-01-29 ENCOUNTER — Ambulatory Visit
Admission: EM | Admit: 2021-01-29 | Discharge: 2021-01-29 | Disposition: A | Discharge status: Home / Self Care | Payer: BC Managed Care – PPO | Attending: Internal Medicine | Admitting: Internal Medicine

## 2021-01-29 DIAGNOSIS — B349 Viral infection, unspecified: Secondary | ICD-10-CM

## 2021-01-29 LAB — POCT INFLUENZA A/B
Influenza A, POC: NEGATIVE
Influenza B, POC: NEGATIVE

## 2021-01-29 MED ORDER — BENZONATATE 100 MG PO CAPS: 100.0000 mg | ORAL_CAPSULE | Freq: Three times a day (TID) | ORAL | 0 refills | Status: DC | PRN

## 2021-01-29 NOTE — Discharge Instructions (Addendum)
As we discussed, it appears you have a viral infection.  I do not see any indication for antibiotics at this time.  Your flu swab was negative.  COVID PCR testing has been sent.  This should take 2 to 3 days to result in your MyChart.  In the meantime, rest, drink plenty of fluids, Tylenol as needed for body aches and fever.  Be careful with all the OTC medications.  I would recommend taking cetirizine 10 mg daily as needed for congestion as well as Flonase nasal spray.  Use prescribed cough suppressant as directed.  Should you develop any worsening symptoms you will need to go you will need to go to the ER should you develop any chest pain or shortness of breath.  Follow-up for any persistent symptoms.  Monitor blood sugars closely and follow-up with PCP in the next 1 to 2 weeks.

## 2021-01-29 NOTE — ED Provider Notes (Signed)
[facility]    CSN: [id number] Arrival date & time: 01/29/21  1043      History   Chief Complaint Chief Complaint  Patient presents with   Cough   Sore Throat   Diarrhea   Nausea    HPI Marguis Mathieson is a 56 y.o. male with multiple medical problems presents to urgent care today with 5-day history of body aches, cough, headache, diarrhea, congestion.  Patient states cough mostly nonproductive.  Describes frequent chills.  He denies any shortness of breath.  Describes pain in chest wall and ribs when coughing but no real chest pain.  Has taken OTC cough suppressant with only very temporary relief.  Home COVID test was negative.    Past Medical History:  Diagnosis Date   Coronary artery disease, non-occlusive 02/28/2008   Dr. Humphrey Rolls @ [facility]: 2 x 40-50% lesions in dominant LCx lesion by cath,  EF 60% with normal wall motion.   Diabetes mellitus without complication ([place])    Essential hypertension    GERD (gastroesophageal reflux disease)    Headache    High triglycerides     was previously on treatment, but  he notes that this was stopped   Kidney stones    Moderate obesity 05/29/2014   OSA treated with BiPAP    Short bowel syndrome 1994    Patient Active Problem List   Diagnosis Date Noted   Worsening headaches 04/30/2019   Neck pain 04/30/2019   Bilateral occipital neuralgia 04/30/2019   Migraine without aura and without status migrainosus, not intractable 04/30/2019   Vertigo 04/30/2019   Abnormal nuclear stress test 02/07/2019   Atypical angina ([place]) 02/07/2019   Central adiposity 08/10/2018   DM (diabetes mellitus) ([place]) 07/08/2018   Medication management 01/01/2016   Hypertriglyceridemia without hypercholesterolemia 02/26/2015   Abnormally low high density lipoprotein (HDL) cholesterol with hypertriglyceridemia 02/26/2015   Atrial premature contractions 23/55/7322   Metabolic syndrome 02/54/2706   Rapid heart beat 02/26/2015   Signs and symptoms  involving emotional state 05/29/2014   Anxiety 05/29/2014   Adult BMI 30+ 05/29/2014   Clinical depression 05/29/2014   Failure of erection 05/29/2014   Abnormal LFTs 05/29/2014   Blood glucose elevated 05/29/2014   GERD (gastroesophageal reflux disease) 05/29/2014   Combined fat and carbohydrate induced hyperlipemia 05/29/2014   Essential hypertension 05/29/2014   Headache, migraine 05/29/2014   Obesity, Class I, BMI 30.0-34.9 (see actual BMI) 05/29/2014   Gastroduodenal ulcer 05/29/2014   Apnea, sleep 23/76/2831   Umbilical hernia without obstruction and without gangrene 05/29/2014   Avitaminosis D 09/14/2009   Arteriosclerosis of coronary artery 02/27/2009    Past Surgical History:  Procedure Laterality Date   "stomach wrap"  1991   GERD   APPENDECTOMY     CARDIAC CATHETERIZATION  02/2008   Dr. Humphrey Rolls @ [facility]: 2 x 40-50% lesions in a dominant LCx by cath,  EF 60% with normal wall motion.   Cardiolite Nuclear Stress Test   January 2010    heart rate increased from 80-158 BPM. No EKG changes.  there is a moderate sized  reversible defect in the inferior and inferior lateral wall as well as anteroseptal. EF 59%.   CardioNet Holter Monitor   January 2010    sinus rhythm. Rare PVCs in setting was. Current PACs , seen in singles and pairs. Max heart rate 127, minimum heart rate 57. Average 84.   CHOLECYSTECTOMY     COLON SURGERY     due to gangrenous  appendix   COLONOSCOPY WITH PROPOFOL N/A 04/26/2016   Procedure: COLONOSCOPY WITH PROPOFOL;  Surgeon: Jonathon Bellows, MD;  Location: [facility] ENDOSCOPY;  Service: Endoscopy;  Laterality: N/A;   ESOPHAGOGASTRODUODENOSCOPY (EGD) WITH PROPOFOL N/A 01/11/2019   Procedure: ESOPHAGOGASTRODUODENOSCOPY (EGD) WITH PROPOFOL;  Surgeon: Jonathon Bellows, MD;  Location: [facility] ENDOSCOPY;  Service: Gastroenterology;  Laterality: N/A;   FLEXIBLE SIGMOIDOSCOPY N/A 01/11/2019   Procedure: FLEXIBLE SIGMOIDOSCOPY;  Surgeon: Jonathon Bellows, MD;  Location: [facility] ENDOSCOPY;   Service: Gastroenterology;  Laterality: N/A;   HERNIA REPAIR  2010   INNER EAR SURGERY     LEFT HEART CATH AND CORONARY ANGIOGRAPHY N/A 02/12/2019   Procedure: LEFT HEART CATH AND CORONARY ANGIOGRAPHY;  Surgeon: Leonie Man, MD;  Location: [facility];  Service: Cardiovascular;  Laterality: N/A;   NM MYOVIEW LTD  01/10/2019   Treadmill Myoview: Walk 9:40 min-10.1 METS) EF ~45 to 50%.  INTERMEDIATE RISK STUDY-moderate sized mostly fixed perfusion defect in anterior apical segment.  No reversibility.  Suggests prior infarct without ischemia.   TONSILLECTOMY     TYMPANOSTOMY TUBE PLACEMENT Bilateral 1973       Home Medications    Prior to Admission medications   Medication Sig Start Date End Date Taking? Authorizing Provider  benzonatate (TESSALON PERLES) 100 MG capsule Take 1 capsule (100 mg total) by mouth 3 (three) times daily as needed for cough. 01/29/21 01/29/22 Yes Rudolpho Sevin, NP  bismuth subsalicylate (PEPTO BISMOL) 262 MG/15ML suspension Take 30 mLs by mouth every 6 (six) hours as needed for indigestion.    [provider]  butalbital-acetaminophen-caffeine (FIORICET) 50-325-40 MG tablet TAKE 1 TABLET BY MOUTH 2 (TWO) TIMES DAILY AS NEEDED FOR HEADACHE. 08/14/20   [name]., MD  chlorthalidone (HYGROTON) 25 MG tablet TAKE 1 TABLET (25 MG TOTAL) BY MOUTH DAILY. WILL NEED AN APPOINTMENT 01/04/21   Leonie Man, MD  CINNAMON PO Take 1,200 mg by mouth daily.    [provider]  guaiFENesin (MUCINEX) 600 MG 12 hr tablet Take by mouth 2 (two) times daily.    [provider]  Homeopathic Products ([place]) FOAM Apply 1 application topically daily as needed (pain/cramps).    [provider]  hyoscyamine (LEVSIN) 0.125 MG tablet TAKE 1 TABLET BY MOUTH 4 TIMES A DAY AS NEEDED FOR PAIN 08/29/19   Jonathon Bellows, MD  losartan (COZAAR) 100 MG tablet TAKE 1 TABLET BY MOUTH EVERY DAY. TAKE WITH HYDROCHLOROTHIAZIDE 12/20/20   [name]., MD  Magnesium 100 MG CAPS Take 200 mg by mouth daily.    [provider]  Menthol, Topical Analgesic, (BIOFREEZE EX) Apply 1 application topically daily as needed (pain).    [provider]  metFORMIN (GLUCOPHAGE) 1000 MG tablet TAKE 1 TABLET (1,000 MG TOTAL) BY MOUTH 2 (TWO) TIMES DAILY WITH A MEAL. 12/14/20   [name]., MD  metoprolol succinate (TOPROL-XL) 100 MG 24 hr tablet TAKE 1 TABLET (100 MG TOTAL) BY MOUTH DAILY. TAKE WITH OR IMMEDIATELY FOLLOWING A MEAL. 01/14/20   Leonie Man, MD  omeprazole (PRILOSEC OTC) 20 MG tablet Take 1 tablet (20 mg total) by mouth daily. 05/14/15   [name]., MD  pioglitazone (ACTOS) 30 MG tablet Take 1 tablet (30 mg total) by mouth daily. 04/09/20   [name]., MD  rosuvastatin (CRESTOR) 20 MG tablet Take 1 tablet (20 mg total) by mouth daily. 01/27/21 04/27/21  Leonie Man, MD  Saw  Palmetto, Serenoa repens, (SAW PALMETTO PO) Take 1,400 mg by mouth daily.    [provider]  Simethicone 125 MG CAPS Take 125 mg by mouth daily as needed (gas).    [provider]  tadalafil (CIALIS) 20 MG tablet TAKE 1 TABLET (20 MG TOTAL) BY MOUTH EVERY THREE (3) DAYS AS NEEDED FOR ERECTILE DYSFUNCTION. 12/16/20   [name]., MD  VASCEPA 1 g capsule Take 2 capsules (2 g total) by mouth 2 (two) times daily. 01/27/21   Leonie Man, MD  vitamin C (ASCORBIC ACID) 250 MG tablet Take 1,000 mg by mouth daily.    [provider]  cholestyramine (QUESTRAN) 4 g packet TAKE 1 PACKET (4 G TOTAL) BY MOUTH 2 (TWO) TIMES DAILY. 10/14/19 02/11/20  Jonathon Bellows, MD  fenofibrate 160 MG tablet Take 1 tablet (160 mg total) by mouth daily. 02/14/18 02/11/20  Leonie Man, MD  imipramine (TOFRANIL) 25 MG tablet TAKE 1 TABLET BY MOUTH EVERYDAY AT BEDTIME. Call office at [phone] to schedule follow up for ongoing refills. 11/19/19 02/11/20  Sater, Nanine Means, MD    Family History Family  History  Problem Relation Age of Onset   Hypertension Mother    Irritable bowel syndrome Mother    Migraines Mother    Lumbar disc disease Mother    Heart attack Mother    Heart attack Maternal Uncle    Heart attack Maternal Grandfather     Social History Social History   Tobacco Use   Smoking status: Former    Packs/day: 1.00    Years: 28.00    Pack years: 28.00    Types: Cigarettes    Quit date: 12/30/2005    Years since quitting: 15.0   Smokeless tobacco: Never  Substance Use Topics   Alcohol use: Yes    Comment: Once or twice a year   Drug use: Not Currently    Types: Marijuana    Comment: Quit in 1982     Allergies   Patient has no known allergies.   Review of Systems As stated in HPI otherwise negative   Physical Exam Triage Vital Signs ED Triage Vitals  Enc Vitals Group     BP 01/29/21 1131 131/87     Pulse Rate 01/29/21 1131 71     Resp 01/29/21 1131 20     Temp 01/29/21 1132 98.2 F (36.8 C)     Temp Source 01/29/21 1131 Oral     SpO2 01/29/21 1131 97 %     Weight --      Height --      Head Circumference --      Peak Flow --      Pain Score 01/29/21 1130 6     Pain Loc --      Pain Edu? --      Excl. in [place]? --    No data found.  Updated Vital Signs BP 131/87 (BP Location: Left Arm)    Pulse 71    Temp 98.2 F (36.8 C) (Oral)    Resp 20    SpO2 97%   Visual Acuity Right Eye Distance:   Left Eye Distance:   Bilateral Distance:    Right Eye Near:   Left Eye Near:    Bilateral Near:     Physical Exam Constitutional:      General: He is not in acute distress.    Appearance: He is well-developed. He is not ill-appearing or toxic-appearing.  HENT:  Right Ear: A middle ear effusion is present. Tympanic membrane is not erythematous.     Left Ear: A middle ear effusion is present. Tympanic membrane is not erythematous.     Nose: Congestion present. No rhinorrhea.     Mouth/Throat:     Mouth: Mucous membranes are moist.      Pharynx: Uvula midline. Posterior oropharyngeal erythema present. No pharyngeal swelling or oropharyngeal exudate.     Tonsils: No tonsillar exudate.  Neck:     Thyroid: No thyromegaly.  Cardiovascular:     Rate and Rhythm: Normal rate and regular rhythm.     Heart sounds: No murmur heard.   No friction rub. No gallop.  Pulmonary:     Effort: Pulmonary effort is normal.     Breath sounds: Normal breath sounds. No wheezing, rhonchi or rales.  Abdominal:     General: There is no distension.     Palpations: Abdomen is soft.     Tenderness: There is no abdominal tenderness. There is no guarding.  Musculoskeletal:     Cervical back: Normal range of motion and neck supple.  Lymphadenopathy:     Cervical: No cervical adenopathy.  Skin:    General: Skin is warm and dry.  Neurological:     General: No focal deficit present.     Mental Status: He is alert.  Psychiatric:        Mood and Affect: Mood normal.        Behavior: Behavior normal.     UC Treatments / Results  Labs (all labs ordered are listed, but only abnormal results are displayed) Labs Reviewed  NOVEL CORONAVIRUS, NAA  POCT INFLUENZA A/B    EKG   Radiology No results found.  Procedures Procedures (including critical care time)  Medications Ordered in UC Medications - No data to display  Initial Impression / Assessment and Plan / UC Course  I have reviewed the triage vital signs and the nursing notes.  Pertinent labs & imaging results that were available during my care of the patient were reviewed by me and considered in my medical decision making (see chart for details).    Acute viral illness -Patient presents with 5-day history of URI symptoms in addition to diarrhea C/W viral illness.  VSS and nontoxic-appearing with no significant findings on exam -Rapid flu test negative -COVID PCR testing sent with strict follow-up precautions discussed -Rest, push fluids, symptomatic treatment with Tylenol as  needed, cetirizine, Flonase  Reviewed expections re: course of current medical issues. Questions answered. Outlined signs and symptoms indicating need for more acute intervention. Pt verbalized understanding. AVS given   Final Clinical Impressions(s) / UC Diagnoses   Final diagnoses:  Viral illness     Discharge Instructions      As we discussed, it appears you have a viral infection.  I do not see any indication for antibiotics at this time.  Your flu swab was negative.  COVID PCR testing has been sent.  This should take 2 to 3 days to result in your MyChart.  In the meantime, rest, drink plenty of fluids, Tylenol as needed for body aches and fever.  Be careful with all the OTC medications.  I would recommend taking cetirizine 10 mg daily as needed for congestion as well as Flonase nasal spray.  Use prescribed cough suppressant as directed.  Should you develop any worsening symptoms you will need to go you will need to go to the ER should you develop any chest pain  or shortness of breath.  Follow-up for any persistent symptoms.  Monitor blood sugars closely and follow-up with PCP in the next 1 to 2 weeks.     ED Prescriptions     Medication Sig Dispense Auth. Provider   benzonatate (TESSALON PERLES) 100 MG capsule Take 1 capsule (100 mg total) by mouth 3 (three) times daily as needed for cough. 30 capsule Rudolpho Sevin, NP      PDMP not reviewed this encounter.   Rudolpho Sevin, NP 01/30/21 1226

## 2021-01-29 NOTE — ED Triage Notes (Signed)
Pt presents with c/o sore throat, body aches, dizziness, chills X Monday.   States his chest hurts after coughing.   States he has been taking OTC cold medicines.

## 2021-01-30 LAB — SARS-COV-2, NAA 2 DAY TAT

## 2021-01-30 LAB — NOVEL CORONAVIRUS, NAA: SARS-CoV-2, NAA: NOT DETECTED

## 2021-01-31 ENCOUNTER — Encounter: Payer: Self-pay | Admitting: Cardiology

## 2021-01-31 NOTE — Assessment & Plan Note (Signed)
Doing well.  Relatively asymptomatic on current dose of beta-blocker.

## 2021-01-31 NOTE — Assessment & Plan Note (Signed)
Mild to moderate disease confirmed.  The circumflex actually was very patulous and it does not seem like lesions were truly 40 to 50% stenoses, more consistent with ectasia and aneurysmal segments with intervening normal diameter segments.  Reinitiate aggressive lipid management.  Continue current blood pressure control.  He should be on aspirin which is not listed.

## 2021-01-31 NOTE — Assessment & Plan Note (Signed)
Restart statin and Vascepa.  Reassess in 3 to 4 months, may still need to restart fenofibrate.

## 2021-01-31 NOTE — Assessment & Plan Note (Signed)
Unfortunately he is no longer on rosuvastatin and Vascepa.  He had also been on a fenofibrate. Triglycerides from July of last year were very poorly controlled.  This is in conjunction with his glycemic control being out of control.  Plan for now will be to restart rosuvastatin along with Vascepa 2 g twice daily. Recheck labs in 3 to 4 months and then reassess whether needs to be back on fenofibrate as well.  He is now on pioglitazone which is likely not the best option for diabetes, would consider possibly SGLT2 inhibitor versus GLP-1 agonist.  He had urination issues BCL2 in the past, but that has been quite some time.

## 2021-01-31 NOTE — Assessment & Plan Note (Signed)
Borderline pressures today on chlorthalidone and losartan along with Toprol.  Stable doses.  Refill

## 2021-01-31 NOTE — Assessment & Plan Note (Signed)
He lost quite a bit of weight now.  Feels much better.  Unfortunately still having issues with glycemic and lipid control.  Is open to start doing more exercise which would hopefully improve blood pressure, glycemic and lipid control.

## 2021-02-02 ENCOUNTER — Emergency Department (INDEPENDENT_AMBULATORY_CARE_PROVIDER_SITE_OTHER): Payer: BC Managed Care – PPO

## 2021-02-02 ENCOUNTER — Emergency Department (INDEPENDENT_AMBULATORY_CARE_PROVIDER_SITE_OTHER)
Admission: RE | Admit: 2021-02-02 | Discharge: 2021-02-02 | Disposition: A | Discharge status: Home / Self Care | Payer: BC Managed Care – PPO | Source: Ambulatory Visit

## 2021-02-02 ENCOUNTER — Other Ambulatory Visit: Payer: Self-pay

## 2021-02-02 VITALS — BP 146/90 | HR 80 | Temp 98.1°F | Resp 18

## 2021-02-02 DIAGNOSIS — R197 Diarrhea, unspecified: Secondary | ICD-10-CM | POA: Diagnosis not present

## 2021-02-02 DIAGNOSIS — R110 Nausea: Secondary | ICD-10-CM | POA: Diagnosis not present

## 2021-02-02 DIAGNOSIS — R059 Cough, unspecified: Secondary | ICD-10-CM

## 2021-02-02 DIAGNOSIS — R52 Pain, unspecified: Secondary | ICD-10-CM

## 2021-02-02 MED ORDER — ONDANSETRON 4 MG PO TBDP
4.0000 mg | ORAL_TABLET | Freq: Once | ORAL | Status: AC
  Administered 2021-02-02: 4 mg via ORAL

## 2021-02-02 MED ORDER — PREDNISONE 20 MG PO TABS: ORAL_TABLET | ORAL | 0 refills | Status: DC

## 2021-02-02 MED ORDER — BENZONATATE 200 MG PO CAPS: 200.0000 mg | ORAL_CAPSULE | Freq: Three times a day (TID) | ORAL | 0 refills | Status: AC | PRN

## 2021-02-02 MED ORDER — DOXYCYCLINE HYCLATE 100 MG PO CAPS: 100.0000 mg | ORAL_CAPSULE | Freq: Two times a day (BID) | ORAL | 0 refills | Status: AC

## 2021-02-02 NOTE — Discharge Instructions (Addendum)
Advised patient to take medication as directed with food to completion.  Advised patient to take Prednisone with first dose of Doxycycline for the next 5 of 10 days.  Advised may use Tessalon Perles daily or as needed for cough.  Encouraged patient increase daily water intake while taking these medications.

## 2021-02-02 NOTE — ED Triage Notes (Signed)
Pt c/o bodyaches and diarrhea x 8 days. Some intermittent nausea. Also worsening dizziness. Hot and cold flashes all the time. Was seen at another UC on 1/6, tested neg for flu and covid. Provider stated he had fluid in his ears. Taking flonase, allegra and OTC cold medicine prn.

## 2021-02-02 NOTE — ED Provider Notes (Signed)
[facility]    CSN: [id number] Arrival date & time: 02/02/21  0955      History   Chief Complaint Chief Complaint  Patient presents with   Generalized Body Aches   Diarrhea    HPI Robert Franklin is a 56 y.o. male.   HPI 56 year old male presents with generalized body aches and diarrhea for 8 days.  PMH significant for CAD, HTN, obesity, and OSA.  Patient was evaluated for similar symptoms at another [facility] urgent care on 01/29/2021.  Past Medical History:  Diagnosis Date   Coronary artery disease, non-occlusive 02/28/2008   Dr. Humphrey Rolls @ [place]: 2 x 40-50% lesions in dominant LCx lesion by cath,  EF 60% with normal wall motion.;  Follow catheterization January 2021 showed stable findings.  LCx lesions likely not stenoses, more normal diameter vessel in setting of diffuse ectasia/aneurysm.   Diabetes mellitus without complication ([place])    Essential hypertension    GERD (gastroesophageal reflux disease)    Headache    High triglycerides     was previously on treatment, but  he notes that this was stopped   Kidney stones    Moderate obesity 05/29/2014   OSA treated with BiPAP    Short bowel syndrome 1994    Patient Active Problem List   Diagnosis Date Noted   Worsening headaches 04/30/2019   Neck pain 04/30/2019   Bilateral occipital neuralgia 04/30/2019   Migraine without aura and without status migrainosus, not intractable 04/30/2019   Vertigo 04/30/2019   Abnormal nuclear stress test 02/07/2019   Atypical angina ([place]) 02/07/2019   Central adiposity 08/10/2018   DM (diabetes mellitus) ([place]) 07/08/2018   Medication management 01/01/2016   Hypercholesterolemia with hypertriglyceridemia 02/26/2015   Abnormally low high density lipoprotein (HDL) cholesterol with hypertriglyceridemia 02/26/2015   Atrial premature contractions 89/21/1941   Metabolic syndrome 74/08/1446   Rapid heart beat 02/26/2015   Signs and symptoms involving emotional state 05/29/2014    Anxiety 05/29/2014   Adult BMI 30+ 05/29/2014   Clinical depression 05/29/2014   Failure of erection 05/29/2014   Abnormal LFTs 05/29/2014   Blood glucose elevated 05/29/2014   GERD (gastroesophageal reflux disease) 05/29/2014   Combined fat and carbohydrate induced hyperlipemia 05/29/2014   Essential hypertension 05/29/2014   Headache, migraine 05/29/2014   Obesity, Class I, BMI 30.0-34.9 (see actual BMI) 05/29/2014   Gastroduodenal ulcer 05/29/2014   Apnea, sleep 18/56/3149   Umbilical hernia without obstruction and without gangrene 05/29/2014   Avitaminosis D 09/14/2009   Arteriosclerosis of coronary artery 02/27/2009    Past Surgical History:  Procedure Laterality Date   "stomach wrap"  1991   GERD   APPENDECTOMY     CARDIAC CATHETERIZATION  02/2008   Dr. Humphrey Rolls @ [facility]: 2 x 40-50% lesions in a dominant LCx by cath,  EF 60% with normal wall motion.   Cardiolite Nuclear Stress Test  01/2008    heart rate increased from 80-158 BPM. No EKG changes.  there is a moderate sized  reversible defect in the inferior and inferior lateral wall as well as anteroseptal. EF 59%.   CardioNet Holter Monitor  01/2008    sinus rhythm. Rare PVCs in setting was. Current PACs , seen in singles and pairs. Max heart rate 127, minimum heart rate 57. Average 84.   CHOLECYSTECTOMY     COLON SURGERY     due to gangrenous appendix   COLONOSCOPY WITH PROPOFOL N/A 04/26/2016   Procedure: COLONOSCOPY WITH PROPOFOL;  Surgeon: Jonathon Bellows,  MD;  Location: [facility] ENDOSCOPY;  Service: Endoscopy;  Laterality: N/A;   ESOPHAGOGASTRODUODENOSCOPY (EGD) WITH PROPOFOL N/A 01/11/2019   Procedure: ESOPHAGOGASTRODUODENOSCOPY (EGD) WITH PROPOFOL;  Surgeon: Jonathon Bellows, MD;  Location: [facility] ENDOSCOPY;  Service: Gastroenterology;  Laterality: N/A;   FLEXIBLE SIGMOIDOSCOPY N/A 01/11/2019   Procedure: FLEXIBLE SIGMOIDOSCOPY;  Surgeon: Jonathon Bellows, MD;  Location[facility] ENDOSCOPY;  Service: Gastroenterology;  Laterality: N/A;   HERNIA  REPAIR  2010   INNER EAR SURGERY     LEFT HEART CATH AND CORONARY ANGIOGRAPHY N/A 02/12/2019   Procedure: LEFT HEART CATH AND CORONARY ANGIOGRAPHY;  Surgeon: Leonie Man, MD;  Location: [facility];  Service: Cardiovascular; Proximal RCA 40%.  Diffuse aneurysmal/ectatic (patulous) dominant LCx with no significant lesions.  Questionable 40 to 50% stenosis-most likely actually just normal diameter with surrounding ectasia/aneurysmal segments.  EF 55 to 60%.  Normal EDP.   NM MYOVIEW LTD  01/10/2019   Treadmill Myoview: Walk 9:40 min-10.1 METS) EF ~45 to 50%.  INTERMEDIATE RISK STUDY-moderate sized mostly fixed perfusion defect in anterior apical segment.  No reversibility.  Suggests prior infarct without ischemia.   TONSILLECTOMY     TYMPANOSTOMY TUBE PLACEMENT Bilateral 1973       Home Medications    Prior to Admission medications   Medication Sig Start Date End Date Taking? Authorizing Provider  benzonatate (TESSALON) 200 MG capsule Take 1 capsule (200 mg total) by mouth 3 (three) times daily as needed for up to 7 days for cough. 02/02/21 02/09/21 Yes Eliezer Lofts, FNP  doxycycline (VIBRAMYCIN) 100 MG capsule Take 1 capsule (100 mg total) by mouth 2 (two) times daily for 10 days. 02/02/21 02/12/21 Yes Eliezer Lofts, FNP  predniSONE (DELTASONE) 20 MG tablet Take 3 tabs PO daily x 5 days. 02/02/21  Yes Eliezer Lofts, FNP  bismuth subsalicylate (PEPTO BISMOL) 262 MG/15ML suspension Take 30 mLs by mouth every 6 (six) hours as needed for indigestion.    [provider]  butalbital-acetaminophen-caffeine (FIORICET) 50-325-40 MG tablet TAKE 1 TABLET BY MOUTH 2 (TWO) TIMES DAILY AS NEEDED FOR HEADACHE. 08/14/20   [name]., MD  chlorthalidone (HYGROTON) 25 MG tablet TAKE 1 TABLET (25 MG TOTAL) BY MOUTH DAILY. WILL NEED AN APPOINTMENT 01/04/21   Leonie Man, MD  CINNAMON PO Take 1,200 mg by mouth daily.    [provider]  guaiFENesin (MUCINEX) 600 MG 12  hr tablet Take by mouth 2 (two) times daily.    [provider]  Homeopathic Products ([place]) FOAM Apply 1 application topically daily as needed (pain/cramps).    [provider]  hyoscyamine (LEVSIN) 0.125 MG tablet TAKE 1 TABLET BY MOUTH 4 TIMES A DAY AS NEEDED FOR PAIN 08/29/19   Jonathon Bellows, MD  losartan (COZAAR) 100 MG tablet TAKE 1 TABLET BY MOUTH EVERY DAY. TAKE WITH HYDROCHLOROTHIAZIDE 12/20/20   [name]., MD  Magnesium 100 MG CAPS Take 200 mg by mouth daily.    [provider]  Menthol, Topical Analgesic, (BIOFREEZE EX) Apply 1 application topically daily as needed (pain).    [provider]  metFORMIN (GLUCOPHAGE) 1000 MG tablet TAKE 1 TABLET (1,000 MG TOTAL) BY MOUTH 2 (TWO) TIMES DAILY WITH A MEAL. 12/14/20   [name]., MD  metoprolol succinate (TOPROL-XL) 100 MG 24 hr tablet TAKE 1 TABLET (100 MG TOTAL) BY MOUTH DAILY. TAKE WITH OR IMMEDIATELY FOLLOWING A MEAL. 01/14/20   Leonie Man, MD  omeprazole (PRILOSEC OTC) 20 MG  tablet Take 1 tablet (20 mg total) by mouth daily. 05/14/15   [name]., MD  pioglitazone (ACTOS) 30 MG tablet Take 1 tablet (30 mg total) by mouth daily. 04/09/20   [name]., MD  rosuvastatin (CRESTOR) 20 MG tablet Take 1 tablet (20 mg total) by mouth daily. 01/27/21 04/27/21  Leonie Man, MD  Saw Palmetto, Serenoa repens, (SAW PALMETTO PO) Take 1,400 mg by mouth daily.    [provider]  Simethicone 125 MG CAPS Take 125 mg by mouth daily as needed (gas).    [provider]  tadalafil (CIALIS) 20 MG tablet TAKE 1 TABLET (20 MG TOTAL) BY MOUTH EVERY THREE (3) DAYS AS NEEDED FOR ERECTILE DYSFUNCTION. 12/16/20   [name]., MD  VASCEPA 1 g capsule Take 2 capsules (2 g total) by mouth 2 (two) times daily. 01/27/21   Leonie Man, MD  vitamin C (ASCORBIC ACID) 250 MG tablet Take 1,000 mg by mouth daily.    [provider]   cholestyramine (QUESTRAN) 4 g packet TAKE 1 PACKET (4 G TOTAL) BY MOUTH 2 (TWO) TIMES DAILY. 10/14/19 02/11/20  Jonathon Bellows, MD  fenofibrate 160 MG tablet Take 1 tablet (160 mg total) by mouth daily. 02/14/18 02/11/20  Leonie Man, MD  imipramine (TOFRANIL) 25 MG tablet TAKE 1 TABLET BY MOUTH EVERYDAY AT BEDTIME. Call office at [phone] to schedule follow up for ongoing refills. 11/19/19 02/11/20  Sater, Nanine Means, MD    Family History Family History  Problem Relation Age of Onset   Hypertension Mother    Irritable bowel syndrome Mother    Migraines Mother    Lumbar disc disease Mother    Heart attack Mother    Heart attack Maternal Uncle    Heart attack Maternal Grandfather     Social History Social History   Tobacco Use   Smoking status: Former    Packs/day: 1.00    Years: 28.00    Pack years: 28.00    Types: Cigarettes    Quit date: 12/30/2005    Years since quitting: 15.1   Smokeless tobacco: Never  Substance Use Topics   Alcohol use: Yes    Comment: Once or twice a year   Drug use: Not Currently    Types: Marijuana    Comment: Quit in 1982     Allergies   Patient has no known allergies.   Review of Systems Review of Systems  Gastrointestinal:  Positive for diarrhea.  Musculoskeletal:  Positive for myalgias.    Physical Exam Triage Vital Signs ED Triage Vitals  Enc Vitals Group     BP      Pulse      Resp      Temp      Temp src      SpO2      Weight      Height      Head Circumference      Peak Flow      Pain Score      Pain Loc      Pain Edu?      Excl. in [place]?    No data found.  Updated Vital Signs BP (!) 146/90 (BP Location: Left Arm)    Pulse 80    Temp 98.1 F (36.7 C) (Oral)    Resp 18    SpO2 97%      Physical Exam Vitals and nursing note reviewed.  Constitutional:  General: He is not in acute distress.    Appearance: Normal appearance. He is obese. He is ill-appearing.  HENT:     Right Ear: Tympanic membrane, ear  canal and external ear normal.     Left Ear: Tympanic membrane, ear canal and external ear normal.     Mouth/Throat:     Mouth: Mucous membranes are moist.     Pharynx: Oropharynx is clear.  Eyes:     Extraocular Movements: Extraocular movements intact.     Conjunctiva/sclera: Conjunctivae normal.     Pupils: Pupils are equal, round, and reactive to light.  Cardiovascular:     Rate and Rhythm: Normal rate and regular rhythm.     Pulses: Normal pulses.     Heart sounds: Normal heart sounds.  Pulmonary:     Effort: Pulmonary effort is normal.     Breath sounds: Rhonchi present.     Comments: Diffuse scattered rhonchi noted throughout, diminished breath sounds bibasilarly Musculoskeletal:     Cervical back: Neck supple. No tenderness.  Lymphadenopathy:     Cervical: No cervical adenopathy.  Skin:    General: Skin is warm and dry.  Neurological:     General: No focal deficit present.     Mental Status: He is alert and oriented to person, place, and time.     UC Treatments / Results  Labs (all labs ordered are listed, but only abnormal results are displayed) Labs Reviewed - No data to display  EKG   Radiology DG Chest 2 View  Result Date: 02/02/2021 CLINICAL DATA:  Provided history: Cough. Additional history provided: Patient reports cough, diarrhea, nausea, body aches, symptoms for 8 days. EXAM: CHEST - 2 VIEW COMPARISON:  Prior chest radiographs 01/29/2013 and earlier FINDINGS: Heart size within normal limits. No appreciable airspace consolidation. No evidence of pleural effusion or pneumothorax. No acute bony abnormality identified. Degenerative changes of the spine. Surgical clips within the right upper quadrant of the abdomen. IMPRESSION: No evidence of active cardiopulmonary disease. Electronically Signed   By: Kellie Simmering D.O.   On: 02/02/2021 10:38    Procedures Procedures (including critical care time)  Medications Ordered in UC Medications  ondansetron  (ZOFRAN-ODT) disintegrating tablet 4 mg (4 mg Oral Given 02/02/21 1019)    Initial Impression / Assessment and Plan / UC Course  I have reviewed the triage vital signs and the nursing notes.  Pertinent labs & imaging results that were available during my care of the patient were reviewed by me and considered in my medical decision making (see chart for details).     MDM: 1.  Generalized body aches-influenza A/B, COVID-19 negative at previous office visit of 01/29/2021; 2.  Cough-negative for acute cardiopulmonary process today, Rx'd Doxycycline, Prednisone, and Tessalon Perles. Advised patient to take medication as directed with food to completion.  Advised patient to take Prednisone with first dose of Doxycycline for the next 5 of 10 days.  Advised may use Tessalon Perles daily or as needed for cough.  Encouraged patient increase daily water intake while taking these medications.  Use note provided to patient prior to discharge.  Patient discharged home, hemodynamically stable. Final Clinical Impressions(s) / UC Diagnoses   Final diagnoses:  Generalized body aches  Cough, unspecified type     Discharge Instructions      Advised patient to take medication as directed with food to completion.  Advised patient to take Prednisone with first dose of Doxycycline for the next 5 of 10 days.  Advised  may use Tessalon Perles daily or as needed for cough.  Encouraged patient increase daily water intake while taking these medications.     ED Prescriptions     Medication Sig Dispense Auth. Provider   doxycycline (VIBRAMYCIN) 100 MG capsule Take 1 capsule (100 mg total) by mouth 2 (two) times daily for 10 days. 20 capsule Eliezer Lofts, FNP   predniSONE (DELTASONE) 20 MG tablet Take 3 tabs PO daily x 5 days. 15 tablet Eliezer Lofts, FNP   benzonatate (TESSALON) 200 MG capsule Take 1 capsule (200 mg total) by mouth 3 (three) times daily as needed for up to 7 days for cough. 50 capsule Eliezer Lofts, FNP      PDMP not reviewed this encounter.   Eliezer Lofts, [place] 02/02/21 1143

## 2021-03-11 ENCOUNTER — Other Ambulatory Visit: Payer: Self-pay | Admitting: Family Medicine

## 2021-03-22 ENCOUNTER — Ambulatory Visit (INDEPENDENT_AMBULATORY_CARE_PROVIDER_SITE_OTHER): Payer: BC Managed Care – PPO | Admitting: Family Medicine

## 2021-03-22 ENCOUNTER — Other Ambulatory Visit: Payer: Self-pay

## 2021-03-22 ENCOUNTER — Encounter: Payer: Self-pay | Admitting: Family Medicine

## 2021-03-22 VITALS — BP 132/89 | HR 73 | Temp 98.2°F | Resp 16 | Ht 70.0 in | Wt 222.0 lb

## 2021-03-22 DIAGNOSIS — I10 Essential (primary) hypertension: Secondary | ICD-10-CM

## 2021-03-22 DIAGNOSIS — E781 Pure hyperglyceridemia: Secondary | ICD-10-CM

## 2021-03-22 DIAGNOSIS — I2510 Atherosclerotic heart disease of native coronary artery without angina pectoris: Secondary | ICD-10-CM

## 2021-03-22 DIAGNOSIS — E119 Type 2 diabetes mellitus without complications: Secondary | ICD-10-CM

## 2021-03-22 DIAGNOSIS — G4733 Obstructive sleep apnea (adult) (pediatric): Secondary | ICD-10-CM | POA: Diagnosis not present

## 2021-03-22 DIAGNOSIS — E782 Mixed hyperlipidemia: Secondary | ICD-10-CM

## 2021-03-22 MED ORDER — FREESTYLE LIBRE 3 SENSOR MISC: 1.0000 | Freq: Every day | 12 refills | Status: DC

## 2021-03-22 NOTE — Progress Notes (Addendum)
Argentina Ponder De[place],acting as a scribe for [name], MD.,have documented all relevant documentation on the behalf of [name], MD,as directed by  [name], MD while in the presence of [name], MD.     Established patient visit   Patient: Robert Franklin   DOB: [birth year]   56 y.o. Male  MRN: [id number] Visit Date: 03/22/2021  Today's healthcare provider: [name], MD   Chief Complaint  Patient presents with   Diabetes   Subjective    HPI  Patient comes in today for follow-up after last being seen last July.  He has been working on dietary changes. .  A1c last July was 8.6.  Overall he is feeling well.  No specific complaints and he is tolerating his medications.  He has seen cardiology since his last visit. Diabetes Mellitus Type II, Follow-up  Lab Results  Component Value Date   HGBA1C 8.6 (H) 08/11/2020   HGBA1C 8.2 (A) 04/09/2020   HGBA1C 7.2 (H) 10/10/2019   Wt Readings from Last 3 Encounters:  03/22/21 222 lb (100.7 kg)  01/27/21 215 lb 6.4 oz (97.7 kg)  11/06/20 230 lb 14.4 oz (104.7 kg)   Last seen for diabetes 7 months ago.  Management since then includes none. He reports good compliance with treatment. He is not having side effects ... sometimes feet tingle  Symptoms: No fatigue No foot ulcerations  No appetite changes No nausea  No paresthesia of the feet  No polydipsia  No polyuria   No vomiting     Home blood sugar records: trend: decreasing steadily  Episodes of hypoglycemia? No    Current insulin regiment:  Most Recent Eye Exam: patient is due Current exercise: none Current diet habits: in general, a "healthy" diet    Pertinent Labs: Lab Results  Component Value Date   CHOL 257 (H) 08/11/2020   HDL 30 (L) 08/11/2020   LDLCALC 99 08/11/2020   LDLDIRECT 46 07/05/2018   TRIG 743 (HH) 08/11/2020   CHOLHDL 8.6 (H) 08/11/2020   Lab Results  Component Value Date   NA 136 08/11/2020   K  4.1 08/11/2020   CREATININE 1.00 08/11/2020   EGFR 89 08/11/2020     --------------------------------------------------------------------------------------------------- Hypertension, follow-up  BP Readings from Last 3 Encounters:  03/22/21 132/89  02/02/21 (!) 146/90  01/29/21 131/87   Wt Readings from Last 3 Encounters:  03/22/21 222 lb (100.7 kg)  01/27/21 215 lb 6.4 oz (97.7 kg)  11/06/20 230 lb 14.4 oz (104.7 kg)     He was last seen for hypertension 7 months ago.  BP at that visit was as above. Management since that visit includes none.  He reports good compliance with treatment. He is not having side effects.  He is following a Low fat diet. He is not exercising. He does not smoke.  Use of agents associated with hypertension: none.   Outside blood pressures are . Symptoms:  chest pain No chest pressure  No palpitations No syncope  No dyspnea No orthopnea  No paroxysmal nocturnal dyspnea No lower extremity edema   Pertinent labs: Lab Results  Component Value Date   CHOL 257 (H) 08/11/2020   HDL 30 (L) 08/11/2020   LDLCALC 99 08/11/2020   LDLDIRECT 46 07/05/2018   TRIG 743 (HH) 08/11/2020   CHOLHDL 8.6 (H) 08/11/2020   Lab Results  Component Value Date   NA 136 08/11/2020   K 4.1 08/11/2020   CREATININE 1.00 08/11/2020  EGFR 89 08/11/2020   GLUCOSE 125 (H) 08/11/2020   TSH 1.700 08/11/2020     The 10-year ASCVD risk score (Arnett DK, et al., 2019) is: 24%   ---------------------------------------------------------------------------------------------------   Medications: Outpatient Medications Prior to Visit  Medication Sig   bismuth subsalicylate (PEPTO BISMOL) 262 MG/15ML suspension Take 30 mLs by mouth every 6 (six) hours as needed for indigestion.   butalbital-acetaminophen-caffeine (FIORICET) 50-325-40 MG tablet TAKE 1 TABLET BY MOUTH 2 (TWO) TIMES DAILY AS NEEDED FOR HEADACHE.   chlorthalidone (HYGROTON) 25 MG tablet TAKE 1 TABLET (25 MG  TOTAL) BY MOUTH DAILY. WILL NEED AN APPOINTMENT   CINNAMON PO Take 1,200 mg by mouth daily.   guaiFENesin (MUCINEX) 600 MG 12 hr tablet Take by mouth 2 (two) times daily.   Homeopathic Products (THERAWORX RELIEF) FOAM Apply 1 application topically daily as needed (pain/cramps).   hyoscyamine (LEVSIN) 0.125 MG tablet TAKE 1 TABLET BY MOUTH 4 TIMES A DAY AS NEEDED FOR PAIN   losartan (COZAAR) 100 MG tablet TAKE 1 TABLET BY MOUTH EVERY DAY. TAKE WITH HYDROCHLOROTHIAZIDE   Magnesium 100 MG CAPS Take 200 mg by mouth daily.   Menthol, Topical Analgesic, (BIOFREEZE EX) Apply 1 application topically daily as needed (pain).   metFORMIN (GLUCOPHAGE) 1000 MG tablet TAKE 1 TABLET (1,000 MG TOTAL) BY MOUTH 2 (TWO) TIMES DAILY WITH A MEAL.   metoprolol succinate (TOPROL-XL) 100 MG 24 hr tablet TAKE 1 TABLET (100 MG TOTAL) BY MOUTH DAILY. TAKE WITH OR IMMEDIATELY FOLLOWING A MEAL.   omeprazole (PRILOSEC OTC) 20 MG tablet Take 1 tablet (20 mg total) by mouth daily.   pioglitazone (ACTOS) 30 MG tablet Take 1 tablet (30 mg total) by mouth daily.   predniSONE (DELTASONE) 20 MG tablet Take 3 tabs PO daily x 5 days.   rosuvastatin (CRESTOR) 20 MG tablet Take 1 tablet (20 mg total) by mouth daily.   Saw Palmetto, Serenoa repens, (SAW PALMETTO PO) Take 1,400 mg by mouth daily.   Simethicone 125 MG CAPS Take 125 mg by mouth daily as needed (gas).   tadalafil (CIALIS) 20 MG tablet TAKE 1 TABLET (20 MG TOTAL) BY MOUTH EVERY THREE (3) DAYS AS NEEDED FOR ERECTILE DYSFUNCTION.   VASCEPA 1 g capsule Take 2 capsules (2 g total) by mouth 2 (two) times daily.   vitamin C (ASCORBIC ACID) 250 MG tablet Take 1,000 mg by mouth daily.   No facility-administered medications prior to visit.    Review of Systems  Last lipids Lab Results  Component Value Date   CHOL 143 03/22/2021   HDL 29 (L) 03/22/2021   LDLCALC 50 03/22/2021   LDLDIRECT 46 07/05/2018   TRIG 431 (H) 03/22/2021   CHOLHDL 8.6 (H) 08/11/2020        Objective    BP 132/89 (BP Location: Right Arm, Patient Position: Sitting, Cuff Size: Normal)    Pulse 73    Temp 98.2 F (36.8 C) (Oral)    Resp 16    Ht _0  (1.778 m)    Wt 222 lb (100.7 kg)    SpO2 98%    BMI 31.85 kg/m  BP Readings from Last 3 Encounters:  03/22/21 132/89  02/02/21 (!) 146/90  01/29/21 131/87   Wt Readings from Last 3 Encounters:  03/22/21 222 lb (100.7 kg)  01/27/21 215 lb 6.4 oz (97.7 kg)  11/06/20 230 lb 14.4 oz (104.7 kg)      Physical Exam Vitals reviewed.  Constitutional:      Appearance:  He is well-developed.  HENT:     Head: Normocephalic and atraumatic.     Right Ear: External ear normal.     Left Ear: External ear normal.     Nose: Nose normal.  Eyes:     General: No scleral icterus.    Conjunctiva/sclera: Conjunctivae normal.  Neck:     Thyroid: No thyromegaly.  Cardiovascular:     Rate and Rhythm: Normal rate and regular rhythm.     Heart sounds: Normal heart sounds.  Pulmonary:     Effort: Pulmonary effort is normal.     Breath sounds: Normal breath sounds.  Abdominal:     Palpations: Abdomen is soft.  Skin:    General: Skin is warm and dry.  Neurological:     General: No focal deficit present.     Mental Status: He is alert and oriented to person, place, and time.  Psychiatric:        Mood and Affect: Mood normal.        Behavior: Behavior normal.        Thought Content: Thought content normal.        Judgment: Judgment normal.      No results found for any visits on 03/22/21.  Assessment & Plan     1. Type 2 diabetes mellitus without complication, without long-term current use of insulin (HCC) Goal A1c less than 7.   - Comprehensive metabolic panel - Hemoglobin A1c - Continuous Blood Gluc Sensor (FREESTYLE LIBRE 3 SENSOR) MISC; 1 each by Does not apply route daily. Place 1 sensor on the skin every 14 days. Use to check glucose continuously  Dispense: 2 each; Refill: 12  2. Essential hypertension Blood pressure under  fair control. - CBC with Differential/Platelet - Comprehensive metabolic panel - TSH  3. Hypertriglyceridemia without hypercholesterolemia Patient now on Vascepa also - Comprehensive metabolic panel - Lipid Panel With LDL/HDL Ratio  4. Obstructive sleep apnea syndrome   5. Hypercholesterolemia with hypertriglyceridemia On statin  6. Coronary artery disease involving native coronary artery of native heart without angina pectoris All risk factors treated.  Followed now by cardiology, maximal medical therapy   No follow-ups on file.      I, [name], MD, have reviewed all documentation for this visit. The documentation on 03/24/21 for the exam, diagnosis, procedures, and orders are all accurate and complete.    Sinclair Arrazola Cranford Mon, MD  [facility] [phone] (phone) [phone] (fax)  [place]

## 2021-03-23 LAB — COMPREHENSIVE METABOLIC PANEL
ALT: 30 IU/L (ref 0–44)
AST: 32 IU/L (ref 0–40)
Albumin/Globulin Ratio: 1.7 (ref 1.2–2.2)
Albumin: 4.9 g/dL (ref 3.8–4.9)
Alkaline Phosphatase: 72 IU/L (ref 44–121)
BUN/Creatinine Ratio: 18 (ref 9–20)
BUN: 21 mg/dL (ref 6–24)
Bilirubin Total: 0.8 mg/dL (ref 0.0–1.2)
CO2: 28 mmol/L (ref 20–29)
Calcium: 9.9 mg/dL (ref 8.7–10.2)
Chloride: 102 mmol/L (ref 96–106)
Creatinine, Ser: 1.14 mg/dL (ref 0.76–1.27)
Globulin, Total: 2.9 g/dL (ref 1.5–4.5)
Glucose: 168 mg/dL — ABNORMAL HIGH (ref 70–99)
Potassium: 4.6 mmol/L (ref 3.5–5.2)
Sodium: 141 mmol/L (ref 134–144)
Total Protein: 7.8 g/dL (ref 6.0–8.5)
eGFR: 76 mL/min/{1.73_m2} (ref >59)

## 2021-03-23 LAB — CBC WITH DIFFERENTIAL/PLATELET
Basophils Absolute: 0.1 10*3/uL (ref 0.0–0.2)
Basos: 1 %
EOS (ABSOLUTE): 0.2 10*3/uL (ref 0.0–0.4)
Eos: 2 %
Hematocrit: 44.8 % (ref 37.5–51.0)
Hemoglobin: 15.9 g/dL (ref 13.0–17.7)
Immature Grans (Abs): 0.1 10*3/uL (ref 0.0–0.1)
Immature Granulocytes: 1 %
Lymphocytes Absolute: 2.4 10*3/uL (ref 0.7–3.1)
Lymphs: 27 %
MCH: 31.2 pg (ref 26.6–33.0)
MCHC: 35.5 g/dL (ref 31.5–35.7)
MCV: 88 fL (ref 79–97)
Monocytes Absolute: 0.7 10*3/uL (ref 0.1–0.9)
Monocytes: 7 %
Neutrophils Absolute: 5.6 10*3/uL (ref 1.4–7.0)
Neutrophils: 62 %
Platelets: 217 10*3/uL (ref 150–450)
RBC: 5.1 x10E6/uL (ref 4.14–5.80)
RDW: 12.5 % (ref 11.6–15.4)
WBC: 9 10*3/uL (ref 3.4–10.8)

## 2021-03-23 LAB — LIPID PANEL WITH LDL/HDL RATIO
Cholesterol, Total: 143 mg/dL (ref 100–199)
HDL: 29 mg/dL — ABNORMAL LOW (ref >39)
LDL Chol Calc (NIH): 50 mg/dL (ref 0–99)
LDL/HDL Ratio: 1.7 ratio (ref 0.0–3.6)
Triglycerides: 431 mg/dL — ABNORMAL HIGH (ref 0–149)
VLDL Cholesterol Cal: 64 mg/dL — ABNORMAL HIGH (ref 5–40)

## 2021-03-23 LAB — HEMOGLOBIN A1C
Est. average glucose Bld gHb Est-mCnc: 171 mg/dL
Hgb A1c MFr Bld: 7.6 % — ABNORMAL HIGH (ref 4.8–5.6)

## 2021-03-23 LAB — TSH: TSH: 1.41 u[IU]/mL (ref 0.450–4.500)

## 2021-04-03 ENCOUNTER — Other Ambulatory Visit: Payer: Self-pay | Admitting: Cardiology

## 2021-04-06 ENCOUNTER — Other Ambulatory Visit: Payer: Self-pay | Admitting: Family Medicine

## 2021-04-06 ENCOUNTER — Other Ambulatory Visit: Payer: Self-pay | Admitting: Cardiology

## 2021-04-06 DIAGNOSIS — E119 Type 2 diabetes mellitus without complications: Secondary | ICD-10-CM

## 2021-04-06 NOTE — Telephone Encounter (Signed)
Requested Prescriptions  ?Pending Prescriptions Disp Refills  ?? pioglitazone (ACTOS) 30 MG tablet [Pharmacy Med Name: PIOGLITAZONE HCL 30 MG TABLET] 90 tablet 0  ?  Sig: TAKE 1 TABLET BY MOUTH EVERY DAY  ?  ? Endocrinology:  Diabetes - Glitazones - pioglitazone Passed - 04/06/2021  2:11 AM  ?  ?  Passed - HBA1C is between 0 and 7.9 and within 180 days  ?  Hgb A1c MFr Bld  ?Date Value Ref Range Status  ?03/22/2021 7.6 (H) 4.8 - 5.6 % Final  ?  Comment:  ?           Prediabetes: 5.7 - 6.4 ?         Diabetes: >6.4 ?         Glycemic control for adults with diabetes: <7.0 ?  ?   ?  ?  Passed - Valid encounter within last 6 months  ?  Recent Outpatient Visits   ?      ? 2 weeks ago Type 2 diabetes mellitus without complication, without long-term current use of insulin ([place])  ?  [facility] [name]., MD  ? 7 months ago Type 2 diabetes mellitus without complication, without long-term current use of insulin ([place])  ? [facility] [name]., MD  ? 12 months ago Type 2 diabetes mellitus without complication, without long-term current use of insulin ([place])  ? [facility] [name]., MD  ? 1 year ago Annual physical exam  ? [facility] [name]., MD  ? 1 year ago Type 2 diabetes mellitus without complication, without long-term current use of insulin ([place])  ? [facility] [name]., MD  ?  ?  ?Future Appointments   ?        ? In 2 months [name]., MD [facility]  ?  ? ?  ?  ?  ? ?

## 2021-04-15 ENCOUNTER — Other Ambulatory Visit: Payer: Self-pay | Admitting: Cardiology

## 2021-05-10 IMAGING — CT CT ABD-PELV W/ CM
2 of 5 series · 16 of 46 positions shown, 18 images · IV contrast (APPLIED)
Comparison: None.

CLINICAL DATA: Abdominal pain, nausea, vomiting, diarrhea. History
of diverticulitis

EXAM:
CT ABDOMEN AND PELVIS WITH CONTRAST
TECHNIQUE: Multidetector CT imaging of the abdomen and pelvis was performed
using the standard protocol following bolus administration of
intravenous contrast.
CONTRAST:  100mL OMNIPAQUE IOHEXOL 300 MG/ML  SOLN

[Series 2: routine abd/pel with · axial · 0.95mm/px · z∈[-548,-43]mm · 13 of 115 slices shown, 15 images]
[im 7/115  soft-tissue]
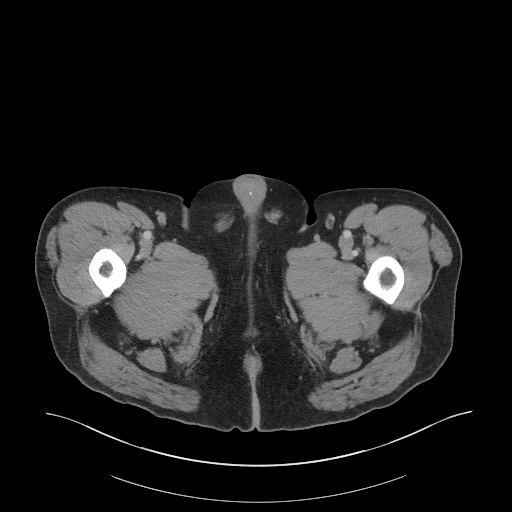
[im 7/115  bone]
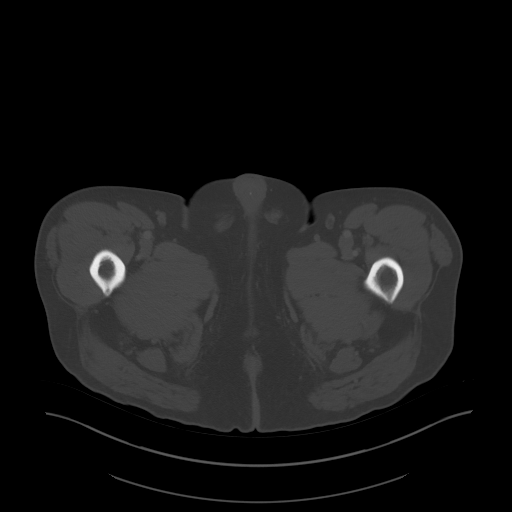
[im 13/115  soft-tissue]
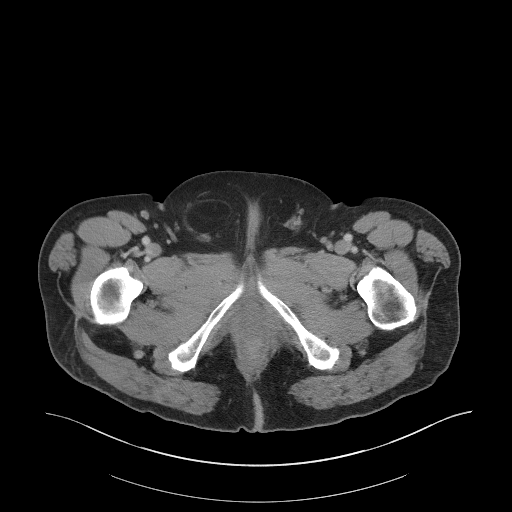
[im 26/115  soft-tissue]
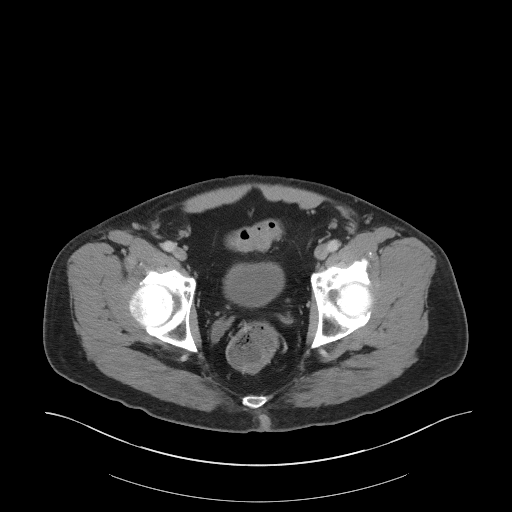
[im 32/115  soft-tissue]
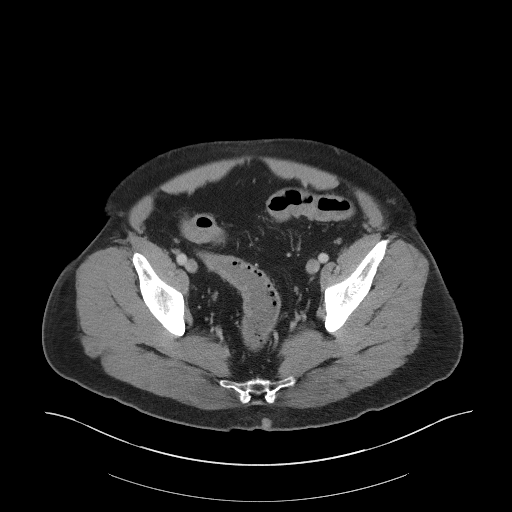
[im 39/115  soft-tissue]
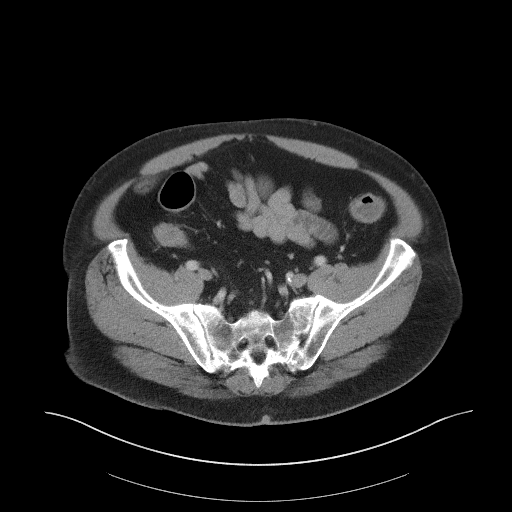
[im 51/115  soft-tissue]
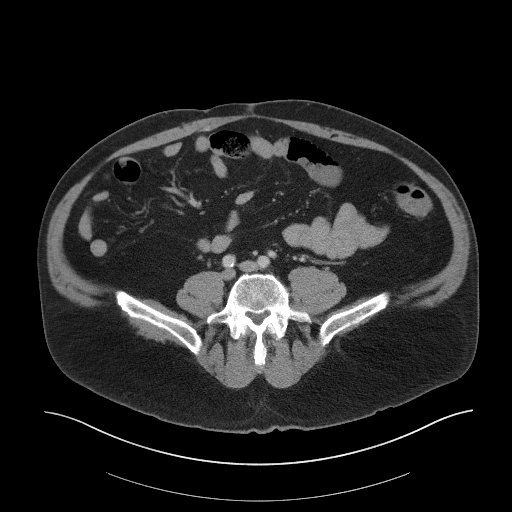
[im 58/115  soft-tissue]
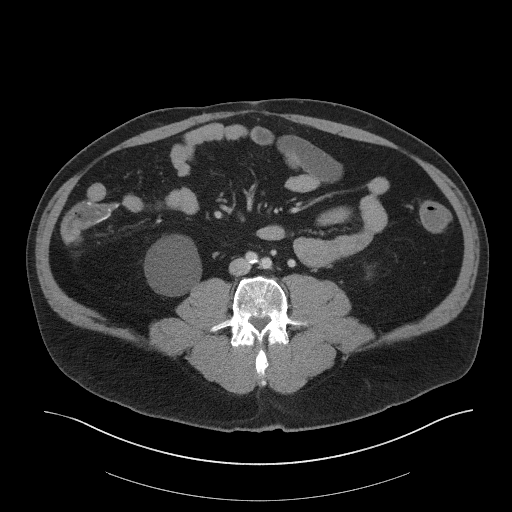
[im 64/115  soft-tissue]
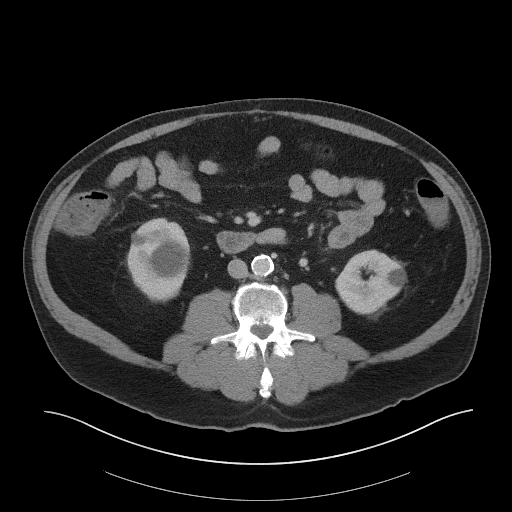
[im 77/115  soft-tissue]
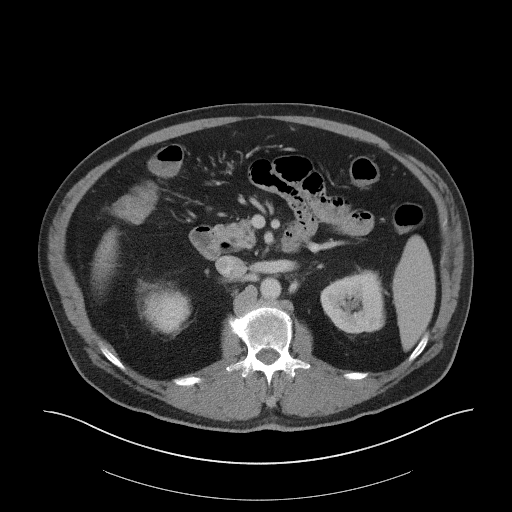
[im 77/115  bone]
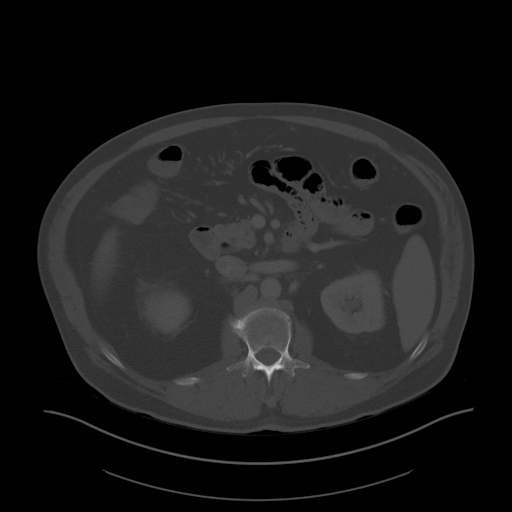
[im 83/115  soft-tissue]
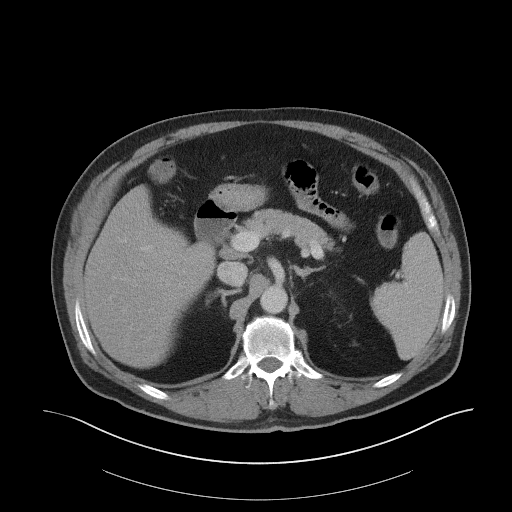
[im 89/115  soft-tissue]
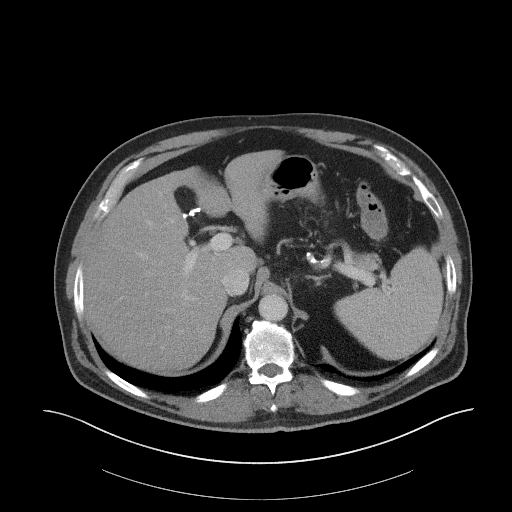
[im 102/115  soft-tissue]
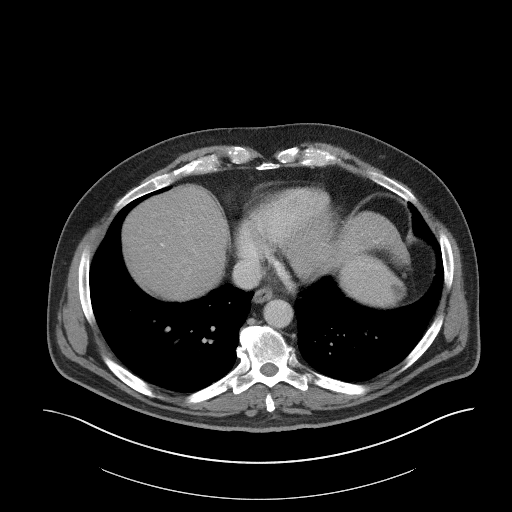
[im 108/115  soft-tissue]
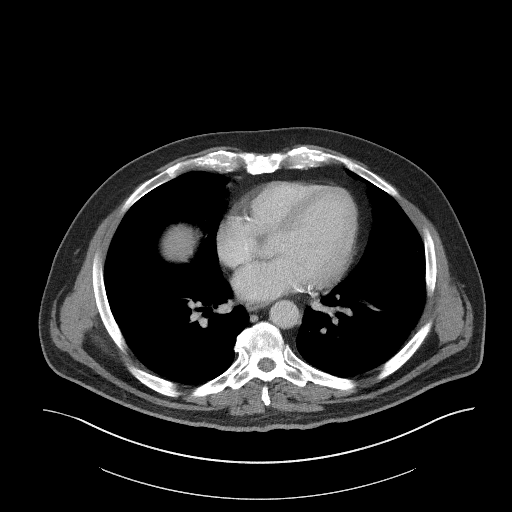

[Series 5: coronal st · coronal · 0.88mm/px · 3 of 107 slices shown]
[im 36/107  soft-tissue]
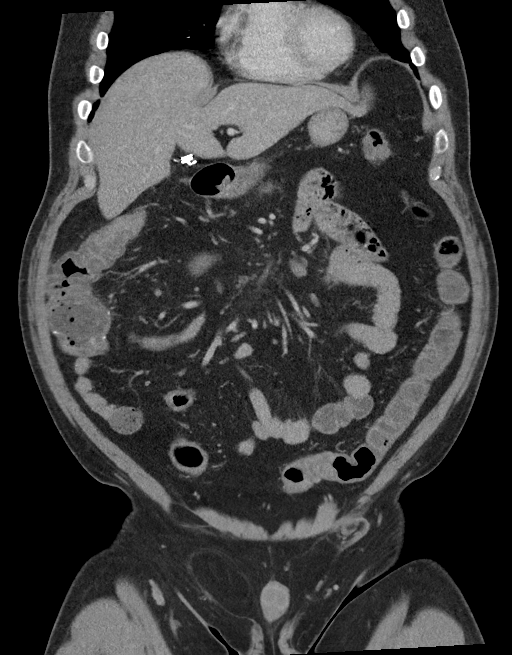
[im 48/107  soft-tissue]
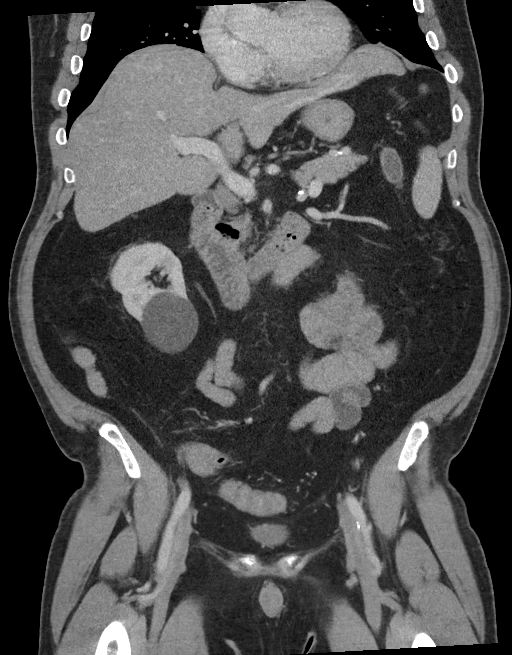
[im 59/107  soft-tissue]
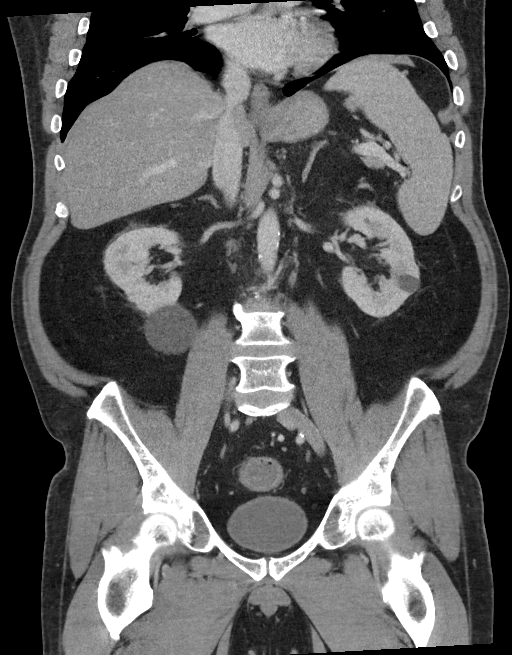

[16 of 46 positions shown; findings below may reference images not displayed]

FINDINGS: Lower chest: No acute abnormality.

Hepatobiliary: No focal liver abnormality is seen. Status post
cholecystectomy. No biliary dilatation.

Pancreas: Unremarkable. No pancreatic ductal dilatation or
surrounding inflammatory changes.

Spleen: 9 mm low-density lesion at the superior pole of the spleen,
too small to definitively characterize but most likely represents
cyst versus hemangioma. Two splenules adjacent to the spleen are
incidentally noted. Spleen otherwise unremarkable.

Adrenals/Urinary Tract: Unremarkable adrenal glands. 6 cm simple
inferior pole right renal cyst. 1.5 cm simple left renal cyst in the
inferior pole. Additional smaller low-density lesions within each
kidney, too small to definitively characterize, but most likely
represent cysts. No hydronephrosis. Ureters unremarkable. Urinary
bladder unremarkable.

Stomach/Bowel: Stomach and small bowel are unremarkable. No dilated
loops of bowel. There are a few scattered colonic diverticula. There
is borderline long segment thickening of the sigmoid colon and
rectum. No pericolonic inflammatory changes. No free fluid. No fluid
collections or free air. Appendix not visualized and may be
surgically absent.

Vascular/Lymphatic: Aortoiliac calcified atherosclerotic disease. No
aneurysm. No abdominopelvic lymphadenopathy.

Reproductive: Prostate is unremarkable.

Other: Fat containing right inguinal hernia. Small fat containing
umbilical hernia. No ascites.

Musculoskeletal: No acute or significant osseous findings.
IMPRESSION: Mild long segment wall thickening involving the rectum and sigmoid
colon suggesting a nonspecific colitis. There are a few scattered
colonic diverticula without focally inflamed diverticulum to suggest
acute diverticulitis.

## 2021-06-16 ENCOUNTER — Other Ambulatory Visit: Payer: Self-pay | Admitting: Family Medicine

## 2021-06-17 ENCOUNTER — Ambulatory Visit: Payer: BC Managed Care – PPO | Admitting: Family Medicine

## 2021-06-25 ENCOUNTER — Other Ambulatory Visit: Payer: Self-pay | Admitting: Cardiology

## 2021-06-25 ENCOUNTER — Other Ambulatory Visit: Payer: Self-pay | Admitting: Family Medicine

## 2021-06-25 DIAGNOSIS — E119 Type 2 diabetes mellitus without complications: Secondary | ICD-10-CM

## 2021-06-28 NOTE — Telephone Encounter (Signed)
Requested Prescriptions  Pending Prescriptions Disp Refills  . pioglitazone (ACTOS) 30 MG tablet [Pharmacy Med Name: PIOGLITAZONE HCL 30 MG TABLET] 90 tablet 0    Sig: TAKE 1 TABLET BY MOUTH EVERY DAY     Endocrinology:  Diabetes - Glitazones - pioglitazone Passed - 06/25/2021  5:32 PM      Passed - HBA1C is between 0 and 7.9 and within 180 days    Hgb A1c MFr Bld  Date Value Ref Range Status  03/22/2021 7.6 (H) 4.8 - 5.6 % Final    Comment:             Prediabetes: 5.7 - 6.4          Diabetes: >6.4          Glycemic control for adults with diabetes: <7.0          Passed - Valid encounter within last 6 months    Recent Outpatient Visits          3 months ago Type 2 diabetes mellitus without complication, without long-term current use of insulin [facility])   [facility] [name]., MD   10 months ago Type 2 diabetes mellitus without complication, without long-term current use of insulin [facility])   [facility] [name]., MD   1 year ago Type 2 diabetes mellitus without complication, without long-term current use of insulin [facility])   [facility] [name]., MD   1 year ago Annual physical exam   [facility] [name]., MD   2 years ago Type 2 diabetes mellitus without complication, without long-term current use of insulin [facility])   [facility] [name]., MD      Future Appointments            In 2 days [name]., MD [facility] [place]

## 2021-06-30 ENCOUNTER — Ambulatory Visit (INDEPENDENT_AMBULATORY_CARE_PROVIDER_SITE_OTHER): Payer: BC Managed Care – PPO | Admitting: Family Medicine

## 2021-06-30 ENCOUNTER — Encounter: Payer: Self-pay | Admitting: Family Medicine

## 2021-06-30 VITALS — BP 118/70 | HR 78 | Resp 16 | Ht 69.0 in | Wt 213.0 lb

## 2021-06-30 DIAGNOSIS — E1142 Type 2 diabetes mellitus with diabetic polyneuropathy: Secondary | ICD-10-CM

## 2021-06-30 DIAGNOSIS — E669 Obesity, unspecified: Secondary | ICD-10-CM

## 2021-06-30 DIAGNOSIS — E66811 Obesity, class 1: Secondary | ICD-10-CM

## 2021-06-30 DIAGNOSIS — I10 Essential (primary) hypertension: Secondary | ICD-10-CM

## 2021-06-30 DIAGNOSIS — G4733 Obstructive sleep apnea (adult) (pediatric): Secondary | ICD-10-CM | POA: Diagnosis not present

## 2021-06-30 DIAGNOSIS — E782 Mixed hyperlipidemia: Secondary | ICD-10-CM

## 2021-06-30 LAB — POCT GLYCOSYLATED HEMOGLOBIN (HGB A1C)
Est. average glucose Bld gHb Est-mCnc: 151
Hemoglobin A1C: 6.9 % — AB (ref 4.0–5.6)

## 2021-06-30 NOTE — Progress Notes (Signed)
Established patient visit  I,[name],acting as a scribe for [name], MD.,have documented all relevant documentation on the behalf of [name], MD,as directed by  [name], MD while in the presence of [name], MD.   Patient: Robert Franklin   DOB: May 30, 1965   56 y.o. Male  MRN: [id number] Visit Date: 06/30/2021  Today's healthcare provider: [name], MD   Chief Complaint  Patient presents with   Follow-up   Hypertension   Diabetes   Subjective    HPI  Comes in today for follow-up.  He has been doing well with diet and exercise is lost 10 pounds since his last visit.  Feels well.  Hypoglycemia.  Diabetes Mellitus Type II, Follow-up  Lab Results  Component Value Date   HGBA1C 6.9 (A) 06/30/2021   HGBA1C 7.6 (H) 03/22/2021   HGBA1C 8.6 (H) 08/11/2020   Wt Readings from Last 3 Encounters:  06/30/21 213 lb (96.6 kg)  03/22/21 222 lb (100.7 kg)  01/27/21 215 lb 6.4 oz (97.7 kg)   Last seen for diabetes 4 months ago.  Management since then includes none.  Home blood sugar records: fasting range: 250  Most Recent Eye Exam: Patient is due for diabetic eye and foot exam    Pertinent Labs: Lab Results  Component Value Date   CHOL 143 03/22/2021   HDL 29 (L) 03/22/2021   LDLCALC 50 03/22/2021   LDLDIRECT 46 07/05/2018   TRIG 431 (H) 03/22/2021   CHOLHDL 8.6 (H) 08/11/2020   Lab Results  Component Value Date   NA 141 03/22/2021   K 4.6 03/22/2021   CREATININE 1.14 03/22/2021   EGFR 76 03/22/2021     --------------------------------------------------------------------------------------------------- Hypertension, follow-up  BP Readings from Last 3 Encounters:  06/30/21 118/70  03/22/21 132/89  02/02/21 (!) 146/90   Wt Readings from Last 3 Encounters:  06/30/21 213 lb (96.6 kg)  03/22/21 222 lb (100.7 kg)  01/27/21 215 lb 6.4 oz (97.7 kg)     He was last seen for hypertension 4 months ago.   BP at that visit was as above. Management since that visit includes none. Outside blood pressures are normal.  Pertinent labs Lab Results  Component Value Date   CHOL 143 03/22/2021   HDL 29 (L) 03/22/2021   LDLCALC 50 03/22/2021   LDLDIRECT 46 07/05/2018   TRIG 431 (H) 03/22/2021   CHOLHDL 8.6 (H) 08/11/2020   Lab Results  Component Value Date   NA 141 03/22/2021   K 4.6 03/22/2021   CREATININE 1.14 03/22/2021   EGFR 76 03/22/2021   GLUCOSE 168 (H) 03/22/2021   TSH 1.410 03/22/2021     The 10-year ASCVD risk score (Arnett DK, et al., 2019) is: 11.7%  --------------------------------------------------------------------------------------------------- NOTE: Patient is due for colonoscopy  Medications: Outpatient Medications Prior to Visit  Medication Sig   chlorthalidone (HYGROTON) 25 MG tablet Take 1 tablet (25 mg total) by mouth daily.   bismuth subsalicylate (PEPTO BISMOL) 262 MG/15ML suspension Take 30 mLs by mouth every 6 (six) hours as needed for indigestion.   butalbital-acetaminophen-caffeine (FIORICET) 50-325-40 MG tablet TAKE 1 TABLET BY MOUTH 2 (TWO) TIMES DAILY AS NEEDED FOR HEADACHE.   CINNAMON PO Take 1,200 mg by mouth daily.   Continuous Blood Gluc Sensor (FREESTYLE LIBRE 3 SENSOR) MISC 1 each by Does not apply route daily. Place 1 sensor on the skin every 14 days. Use to check glucose continuously   guaiFENesin (  MUCINEX) 600 MG 12 hr tablet Take by mouth 2 (two) times daily.   Homeopathic Products (THERAWORX RELIEF) FOAM Apply 1 application topically daily as needed (pain/cramps).   hyoscyamine (LEVSIN) 0.125 MG tablet TAKE 1 TABLET BY MOUTH 4 TIMES A DAY AS NEEDED FOR PAIN   losartan (COZAAR) 100 MG tablet TAKE 1 TABLET BY MOUTH EVERY DAY. TAKE WITH HYDROCHLOROTHIAZIDE   Magnesium 100 MG CAPS Take 200 mg by mouth daily.   Menthol, Topical Analgesic, (BIOFREEZE EX) Apply 1 application topically daily as needed (pain).   metFORMIN (GLUCOPHAGE) 1000 MG tablet  TAKE 1 TABLET (1,000 MG TOTAL) BY MOUTH 2 (TWO) TIMES DAILY WITH A MEAL.   metoprolol succinate (TOPROL-XL) 100 MG 24 hr tablet TAKE 1 TABLET BY MOUTH DAILY. TAKE WITH OR IMMEDIATELY FOLLOWING A MEAL.   omeprazole (PRILOSEC OTC) 20 MG tablet Take 1 tablet (20 mg total) by mouth daily.   pioglitazone (ACTOS) 30 MG tablet TAKE 1 TABLET BY MOUTH EVERY DAY   predniSONE (DELTASONE) 20 MG tablet Take 3 tabs PO daily x 5 days.   rosuvastatin (CRESTOR) 20 MG tablet Take 1 tablet (20 mg total) by mouth daily.   Saw Palmetto, Serenoa repens, (SAW PALMETTO PO) Take 1,400 mg by mouth daily.   Simethicone 125 MG CAPS Take 125 mg by mouth daily as needed (gas).   tadalafil (CIALIS) 20 MG tablet TAKE 1 TABLET (20 MG TOTAL) BY MOUTH EVERY THREE (3) DAYS AS NEEDED FOR ERECTILE DYSFUNCTION.   VASCEPA 1 g capsule TAKE 2 CAPSULES (2 G TOTAL) BY MOUTH 2 (TWO) TIMES DAILY.   vitamin C (ASCORBIC ACID) 250 MG tablet Take 1,000 mg by mouth daily.   No facility-administered medications prior to visit.    Review of Systems  Last hemoglobin A1c Lab Results  Component Value Date   HGBA1C 6.9 (A) 06/30/2021       Objective    BP 118/70 (BP Location: Right Arm, Patient Position: Sitting, Cuff Size: Large)   Pulse 78   Resp 16   Ht _0  (1.753 m)   Wt 213 lb (96.6 kg)   SpO2 95%   BMI 31.45 kg/m  BP Readings from Last 3 Encounters:  06/30/21 118/70  03/22/21 132/89  02/02/21 (!) 146/90   Wt Readings from Last 3 Encounters:  06/30/21 213 lb (96.6 kg)  03/22/21 222 lb (100.7 kg)  01/27/21 215 lb 6.4 oz (97.7 kg)      Physical Exam Vitals reviewed.  Constitutional:      Appearance: He is well-developed.  HENT:     Head: Normocephalic and atraumatic.     Right Ear: External ear normal.     Left Ear: External ear normal.     Nose: Nose normal.  Eyes:     General: No scleral icterus.    Conjunctiva/sclera: Conjunctivae normal.  Neck:     Thyroid: No thyromegaly.  Cardiovascular:     Rate and  Rhythm: Normal rate and regular rhythm.     Heart sounds: Normal heart sounds.  Pulmonary:     Effort: Pulmonary effort is normal.     Breath sounds: Normal breath sounds.  Abdominal:     Palpations: Abdomen is soft.  Skin:    General: Skin is warm and dry.  Neurological:     General: No focal deficit present.     Mental Status: He is alert and oriented to person, place, and time.  Psychiatric:        Mood and Affect: Mood  normal.        Behavior: Behavior normal.        Thought Content: Thought content normal.        Judgment: Judgment normal.       Results for orders placed or performed in visit on 06/30/21  POCT glycosylated hemoglobin (Hb A1C)  Result Value Ref Range   Hemoglobin A1C 6.9 (A) 4.0 - 5.6 %   Est. average glucose Bld gHb Est-mCnc 151     Assessment & Plan     1. Essential hypertension Blood pressure control.  2. Type 2 diabetes mellitus with diabetic polyneuropathy, without long-term current use of insulin (HCC) Under good control at 6.9.  Continue to work on diet and exercise. - POCT glycosylated hemoglobin (Hb A1C)  3. Obstructive sleep apnea syndrome   4. Obesity, Class I, BMI 30.0-34.9 (see actual BMI) Normal weight loss.  BMI down.  To get below 200 pounds.  5. Mixed hyperlipidemia TriGlycerides down.  Follow-up on Crestor   No follow-ups on file.      I, [name], MD, have reviewed all documentation for this visit. The documentation on 07/09/21 for the exam, diagnosis, procedures, and orders are all accurate and complete.    Minola Guin Cranford Mon, MD  [facility] [phone] (phone) [phone] (fax)  [place]

## 2021-07-09 ENCOUNTER — Other Ambulatory Visit: Payer: Self-pay | Admitting: Family Medicine

## 2021-07-09 DIAGNOSIS — N529 Male erectile dysfunction, unspecified: Secondary | ICD-10-CM

## 2021-08-24 ENCOUNTER — Telehealth: Payer: Self-pay | Admitting: Cardiology

## 2021-08-24 ENCOUNTER — Telehealth: Payer: Self-pay

## 2021-08-24 NOTE — Telephone Encounter (Signed)
Patient informed to stop taking chlorthalidone, stay hydrated, monitor blood pressure, and to conact pcp regarding no appetite and weight loss. Patient stated his pcp knows about weight loss and decreased appetite, but is only focused on his diabetes. He stated he is unhappy with pcp.

## 2021-08-24 NOTE — Telephone Encounter (Signed)
LMTCB regarding orders.

## 2021-08-24 NOTE — Telephone Encounter (Signed)
Pt c/o BP issue: STAT if pt c/o blurred vision, one-sided weakness or slurred speech  1. What are your last 5 BP readings? 98/70 76      107/71 65      102/68 73      108/71 71  2. Are you having any other symptoms (ex. Dizziness, headache, blurred vision, passed out)? Slight headache, can't think clearly, not remember things, dizziness, losing weight  3. What is your BP issue? BP low.

## 2021-08-24 NOTE — Telephone Encounter (Addendum)
Patient reported that for 1 week he has not be feeling well. He gets dizzy when bending over and has chest and back achiness. He states he cannot think clearly. He denies sob or weakness. He stated he lost weight because he has no appetite 265-210. He is staying hydrated. BP 98/70, 108/71, P 50s-70s. Reviewed medications with patient. He said he was not taking crestor because he was told to stop taking it. I did not see an order for it discontinued. Dr. Selena Batten informed of this notation. Order to stop chlorthalidone and for patient to stay hydrated and to see PCP regarding no appetite and weight loss.

## 2021-08-24 NOTE — Telephone Encounter (Signed)
Pt returning call. Transferred to RN, Judson Roch.

## 2021-09-24 ENCOUNTER — Other Ambulatory Visit: Payer: Self-pay | Admitting: Family Medicine

## 2021-09-24 DIAGNOSIS — E119 Type 2 diabetes mellitus without complications: Secondary | ICD-10-CM

## 2021-09-24 NOTE — Telephone Encounter (Signed)
Requested Prescriptions  Pending Prescriptions Disp Refills  . pioglitazone (ACTOS) 30 MG tablet [Pharmacy Med Name: PIOGLITAZONE HCL 30 MG TABLET] 90 tablet 0    Sig: TAKE 1 TABLET BY MOUTH EVERY DAY     Endocrinology:  Diabetes - Glitazones - pioglitazone Passed - 09/24/2021  2:17 AM      Passed - HBA1C is between 0 and 7.9 and within 180 days    Hemoglobin A1C  Date Value Ref Range Status  06/30/2021 6.9 (A) 4.0 - 5.6 % Final   Hgb A1c MFr Bld  Date Value Ref Range Status  03/22/2021 7.6 (H) 4.8 - 5.6 % Final    Comment:             Prediabetes: 5.7 - 6.4          Diabetes: >6.4          Glycemic control for adults with diabetes: <7.0          Passed - Valid encounter within last 6 months    Recent Outpatient Visits          2 months ago Type 2 diabetes mellitus with diabetic polyneuropathy, without long-term current use of insulin [facility])   [facility] [name]., MD   6 months ago Type 2 diabetes mellitus without complication, without long-term current use of insulin [facility])   [facility] [name]., MD   1 year ago Type 2 diabetes mellitus without complication, without long-term current use of insulin [facility])   [facility] [name]., MD   1 year ago Type 2 diabetes mellitus without complication, without long-term current use of insulin [facility])   [facility] [name]., MD   1 year ago Annual physical exam   [facility] [name]., MD      Future Appointments            In 2 weeks [name]., MD [facility] [place]   In 2 weeks Leonie Man, MD [place] [facility] [place] [place]

## 2021-10-12 ENCOUNTER — Ambulatory Visit (INDEPENDENT_AMBULATORY_CARE_PROVIDER_SITE_OTHER): Payer: PRIVATE HEALTH INSURANCE | Admitting: Family Medicine

## 2021-10-12 ENCOUNTER — Encounter: Payer: Self-pay | Admitting: Family Medicine

## 2021-10-12 VITALS — BP 131/85 | HR 66 | Resp 16 | Wt 218.0 lb

## 2021-10-12 DIAGNOSIS — E782 Mixed hyperlipidemia: Secondary | ICD-10-CM

## 2021-10-12 DIAGNOSIS — I10 Essential (primary) hypertension: Secondary | ICD-10-CM | POA: Diagnosis not present

## 2021-10-12 DIAGNOSIS — E8881 Metabolic syndrome: Secondary | ICD-10-CM

## 2021-10-12 DIAGNOSIS — G4733 Obstructive sleep apnea (adult) (pediatric): Secondary | ICD-10-CM

## 2021-10-12 DIAGNOSIS — E1142 Type 2 diabetes mellitus with diabetic polyneuropathy: Secondary | ICD-10-CM

## 2021-10-12 DIAGNOSIS — E669 Obesity, unspecified: Secondary | ICD-10-CM

## 2021-10-12 DIAGNOSIS — I2510 Atherosclerotic heart disease of native coronary artery without angina pectoris: Secondary | ICD-10-CM

## 2021-10-12 NOTE — Progress Notes (Signed)
Established patient visit  I,Robert Franklin,acting as a scribe for Robert Durie, MD.,have documented all relevant documentation on the behalf of Robert Durie, MD,as directed by  Robert Durie, MD while in the presence of Robert Durie, MD.   Patient: Robert Franklin   DOB: 02-07-1965   56 y.o. Male  MRN: [id number] Visit Date: 10/12/2021  Today's healthcare provider: [name], MD   Chief Complaint  Patient presents with   Follow-up   Diabetes   Hyperlipidemia   Subjective    HPI  Working hard on good diet and exercise habits.  Pressure was running a little bit low and Dr. Ellyn Hack from cardiology stopped his chlorthalidone and decrease Toprol  Diabetes Mellitus Type II, follow-up  Lab Results  Component Value Date   HGBA1C 6.9 (A) 06/30/2021   HGBA1C 7.6 (H) 03/22/2021   HGBA1C 8.6 (H) 08/11/2020   Last seen for diabetes 3 months ago.  Management since then includes continuing the same treatment.  Home blood sugar records: trend: decreasing steadily Most Recent Eye Exam: due  --------------------------------------------------------------------------------------------------- Hypertension, follow-up  BP Readings from Last 3 Encounters:  10/12/21 131/85  06/30/21 118/70  03/22/21 132/89   Wt Readings from Last 3 Encounters:  10/12/21 218 lb (98.9 kg)  06/30/21 213 lb (96.6 kg)  03/22/21 222 lb (100.7 kg)     He was last seen for hypertension 3 months ago.  Management since that visit includes; good control.  Outside blood pressures are .  --------------------------------------------------------------------------------------------------- Lipid/Cholesterol, follow-up  Last Lipid Panel: Lab Results  Component Value Date   CHOL 143 03/22/2021   LDLCALC 50 03/22/2021   LDLDIRECT 46 07/05/2018   HDL 29 (L) 03/22/2021   TRIG 431 (H) 03/22/2021    He was last seen for this 7 months ago.  Management since that visit  includes; Triglycerides down.  Follow-up on Crestor.  Last metabolic panel Lab Results  Component Value Date   GLUCOSE 168 (H) 03/22/2021   NA 141 03/22/2021   K 4.6 03/22/2021   BUN 21 03/22/2021   CREATININE 1.14 03/22/2021   EGFR 76 03/22/2021   GFRNONAA 72 10/10/2019   CALCIUM 9.9 03/22/2021   AST 32 03/22/2021   ALT 30 03/22/2021   The 10-year ASCVD risk score (Arnett DK, et al., 2019) is: 13.9%  ---------------------------------------------------------------------------------------------------   Medications: Outpatient Medications Prior to Visit  Medication Sig   BIOTIN PO Take 10,000 mcg by mouth 2 (two) times daily.   bismuth subsalicylate (PEPTO BISMOL) 262 MG/15ML suspension Take 30 mLs by mouth every 6 (six) hours as needed for indigestion.   butalbital-acetaminophen-caffeine (FIORICET) 50-325-40 MG tablet TAKE 1 TABLET BY MOUTH 2 (TWO) TIMES DAILY AS NEEDED FOR HEADACHE.   Charcoal Activated (ACTIVATED CHARCOAL PO) Take 260 mg by mouth 2 (two) times daily.   CINNAMON PO Take 1,200 mg by mouth daily.   Continuous Blood Gluc Sensor (FREESTYLE LIBRE 3 SENSOR) MISC 1 each by Does not apply route daily. Place 1 sensor on the skin every 14 days. Use to check glucose continuously   losartan (COZAAR) 100 MG tablet TAKE 1 TABLET BY MOUTH EVERY DAY. TAKE WITH HYDROCHLOROTHIAZIDE   Magnesium 100 MG CAPS Take 200 mg by mouth daily.   Menthol, Topical Analgesic, (BIOFREEZE EX) Apply 1 application topically daily as needed (pain).   metFORMIN (GLUCOPHAGE) 1000 MG tablet TAKE 1 TABLET (1,000 MG TOTAL) BY MOUTH 2 (TWO) TIMES DAILY WITH A MEAL.   metoprolol succinate (  TOPROL-XL) 100 MG 24 hr tablet TAKE 1 TABLET BY MOUTH DAILY. TAKE WITH OR IMMEDIATELY FOLLOWING A MEAL. (Patient taking differently: 50 mg daily.)   omeprazole (PRILOSEC OTC) 20 MG tablet Take 1 tablet (20 mg total) by mouth daily.   pioglitazone (ACTOS) 30 MG tablet TAKE 1 TABLET BY MOUTH EVERY DAY   Potassium 99 MG  TABS Take 99 mg by mouth daily.   Probiotic Product (ALIGN) 4 MG CAPS Take 4 mg by mouth daily.   Saw Palmetto, Serenoa repens, (SAW PALMETTO PO) Take 1,400 mg by mouth daily.   Simethicone 125 MG CAPS Take 125 mg by mouth daily as needed (gas).   tadalafil (CIALIS) 20 MG tablet TAKE 1 TABLET (20 MG TOTAL) BY MOUTH EVERY THREE (3) DAYS AS NEEDED FOR ERECTILE DYSFUNCTION.   TURMERIC PO Take 2,600 mg by mouth 2 (two) times daily.   VASCEPA 1 g capsule TAKE 2 CAPSULES (2 G TOTAL) BY MOUTH 2 (TWO) TIMES DAILY.   vitamin C (ASCORBIC ACID) 250 MG tablet Take 1,000 mg by mouth daily.   rosuvastatin (CRESTOR) 20 MG tablet Take 1 tablet (20 mg total) by mouth daily.   [DISCONTINUED] cholestyramine (QUESTRAN) 4 g packet TAKE 1 PACKET (4 G TOTAL) BY MOUTH 2 (TWO) TIMES DAILY.   [DISCONTINUED] fenofibrate 160 MG tablet Take 1 tablet (160 mg total) by mouth daily.   [DISCONTINUED] guaiFENesin (MUCINEX) 600 MG 12 hr tablet Take by mouth 2 (two) times daily. (Patient not taking: Reported on 10/12/2021)   [DISCONTINUED] Homeopathic Products (Clare) FOAM Apply 1 application topically daily as needed (pain/cramps). (Patient not taking: Reported on 10/12/2021)   [DISCONTINUED] hyoscyamine (LEVSIN) 0.125 MG tablet TAKE 1 TABLET BY MOUTH 4 TIMES A DAY AS NEEDED FOR PAIN (Patient not taking: Reported on 10/12/2021)   [DISCONTINUED] imipramine (TOFRANIL) 25 MG tablet TAKE 1 TABLET BY MOUTH EVERYDAY AT BEDTIME. Call office at [phone] to schedule follow up for ongoing refills.   [DISCONTINUED] predniSONE (DELTASONE) 20 MG tablet Take 3 tabs PO daily x 5 days. (Patient not taking: Reported on 10/12/2021)   No facility-administered medications prior to visit.    Review of Systems  Constitutional:  Negative for appetite change, chills and fever.  Respiratory:  Negative for chest tightness, shortness of breath and wheezing.   Cardiovascular:  Negative for chest pain and palpitations.  Gastrointestinal:   Negative for abdominal pain, nausea and vomiting.    Last hemoglobin A1c Lab Results  Component Value Date   HGBA1C 6.9 (H) 10/12/2021       Objective    BP 131/85 (BP Location: Right Arm, Patient Position: Sitting, Cuff Size: Large)   Pulse 66   Resp 16   Wt 218 lb (98.9 kg)   SpO2 97%   BMI 32.19 kg/m  BP Readings from Last 3 Encounters:  10/13/21 138/76  10/12/21 131/85  06/30/21 118/70   Wt Readings from Last 3 Encounters:  10/12/21 218 lb (98.9 kg)  06/30/21 213 lb (96.6 kg)  03/22/21 222 lb (100.7 kg)      Physical Exam Vitals reviewed.  Constitutional:      General: He is not in acute distress.    Appearance: He is well-developed.  HENT:     Head: Normocephalic and atraumatic.     Right Ear: Hearing normal.     Left Ear: Hearing normal.     Nose: Nose normal.  Eyes:     General: Lids are normal. No scleral icterus.  Right eye: No discharge.        Left eye: No discharge.     Conjunctiva/sclera: Conjunctivae normal.  Cardiovascular:     Rate and Rhythm: Normal rate and regular rhythm.     Heart sounds: Normal heart sounds.  Pulmonary:     Effort: Pulmonary effort is normal. No respiratory distress.  Skin:    Findings: No lesion or rash.  Neurological:     General: No focal deficit present.     Mental Status: He is alert and oriented to person, place, and time.  Psychiatric:        Mood and Affect: Mood normal.        Speech: Speech normal.        Behavior: Behavior normal.        Thought Content: Thought content normal.        Judgment: Judgment normal.       No results found for any visits on 10/12/21.  Assessment & Plan     1. Type 2 diabetes mellitus with diabetic polyneuropathy, without long-term current use of insulin (HCC) A1c less than 7 is a goal. - Lipid panel - TSH - CBC w/Diff/Platelet - Comprehensive Metabolic Panel (CMET) - Hemoglobin A1c  2. Essential hypertension  - Lipid panel - TSH - CBC w/Diff/Platelet -  Comprehensive Metabolic Panel (CMET) - Hemoglobin A1c  3. Mixed hyperlipidemia Triglycerides have been high.  Continue to work on habits. - Lipid panel - TSH - CBC w/Diff/Platelet - Comprehensive Metabolic Panel (CMET) - Hemoglobin A1c  4. Obstructive sleep apnea syndrome  - Lipid panel - TSH - CBC w/Diff/Platelet - Comprehensive Metabolic Panel (CMET) - Hemoglobin A1c  5. Obesity, Class I, BMI 30.0-34.9 (see actual BMI) Continue good lifestyle choices - Lipid panel - TSH - CBC w/Diff/Platelet - Comprehensive Metabolic Panel (CMET) - Hemoglobin A1c  6. Coronary artery disease involving native coronary artery of native heart without angina pectoris Risk factors treated - Lipid panel - TSH - CBC w/Diff/Platelet - Comprehensive Metabolic Panel (CMET) - Hemoglobin A1c  7. Metabolic syndrome   8. Hypercholesterolemia with hypertriglyceridemia    No follow-ups on file.      I, Robert Durie, MD, have reviewed all documentation for this visit. The documentation on 10/16/21 for the exam, diagnosis, procedures, and orders are all accurate and complete.    Aristotelis Vilardi Cranford Mon, MD  [facility] [phone] (phone) [phone] (fax)  [place]

## 2021-10-13 ENCOUNTER — Encounter: Payer: Self-pay | Admitting: Cardiology

## 2021-10-13 ENCOUNTER — Ambulatory Visit: Payer: No Typology Code available for payment source | Attending: Cardiology | Admitting: Cardiology

## 2021-10-13 VITALS — BP 138/76 | HR 65 | Ht 69.0 in | Wt 218.0 lb

## 2021-10-13 DIAGNOSIS — I2510 Atherosclerotic heart disease of native coronary artery without angina pectoris: Secondary | ICD-10-CM

## 2021-10-13 DIAGNOSIS — E782 Mixed hyperlipidemia: Secondary | ICD-10-CM

## 2021-10-13 DIAGNOSIS — I10 Essential (primary) hypertension: Secondary | ICD-10-CM | POA: Diagnosis not present

## 2021-10-13 DIAGNOSIS — E669 Obesity, unspecified: Secondary | ICD-10-CM | POA: Diagnosis not present

## 2021-10-13 DIAGNOSIS — E8881 Metabolic syndrome: Secondary | ICD-10-CM

## 2021-10-13 DIAGNOSIS — R5383 Other fatigue: Secondary | ICD-10-CM

## 2021-10-13 DIAGNOSIS — Z79899 Other long term (current) drug therapy: Secondary | ICD-10-CM

## 2021-10-13 DIAGNOSIS — I491 Atrial premature depolarization: Secondary | ICD-10-CM

## 2021-10-13 LAB — CBC WITH DIFFERENTIAL/PLATELET
Basophils Absolute: 0.1 10*3/uL (ref 0.0–0.2)
Basos: 1 %
EOS (ABSOLUTE): 0.3 10*3/uL (ref 0.0–0.4)
Eos: 4 %
Hematocrit: 43.1 % (ref 37.5–51.0)
Hemoglobin: 14.8 g/dL (ref 13.0–17.7)
Immature Grans (Abs): 0 10*3/uL (ref 0.0–0.1)
Immature Granulocytes: 0 %
Lymphocytes Absolute: 2.4 10*3/uL (ref 0.7–3.1)
Lymphs: 28 %
MCH: 29.8 pg (ref 26.6–33.0)
MCHC: 34.3 g/dL (ref 31.5–35.7)
MCV: 87 fL (ref 79–97)
Monocytes Absolute: 0.6 10*3/uL (ref 0.1–0.9)
Monocytes: 8 %
Neutrophils Absolute: 5.1 10*3/uL (ref 1.4–7.0)
Neutrophils: 59 %
Platelets: 203 10*3/uL (ref 150–450)
RBC: 4.97 x10E6/uL (ref 4.14–5.80)
RDW: 11.8 % (ref 11.6–15.4)
WBC: 8.6 10*3/uL (ref 3.4–10.8)

## 2021-10-13 LAB — LIPID PANEL
Chol/HDL Ratio: 3.8 ratio (ref 0.0–5.0)
Cholesterol, Total: 102 mg/dL (ref 100–199)
HDL: 27 mg/dL — ABNORMAL LOW (ref >39)
LDL Chol Calc (NIH): 42 mg/dL (ref 0–99)
Triglycerides: 206 mg/dL — ABNORMAL HIGH (ref 0–149)
VLDL Cholesterol Cal: 33 mg/dL (ref 5–40)

## 2021-10-13 LAB — COMPREHENSIVE METABOLIC PANEL
ALT: 25 IU/L (ref 0–44)
AST: 30 IU/L (ref 0–40)
Albumin/Globulin Ratio: 2.2 (ref 1.2–2.2)
Albumin: 4.8 g/dL (ref 3.8–4.9)
Alkaline Phosphatase: 60 IU/L (ref 44–121)
BUN/Creatinine Ratio: 17 (ref 9–20)
BUN: 16 mg/dL (ref 6–24)
Bilirubin Total: 0.7 mg/dL (ref 0.0–1.2)
CO2: 23 mmol/L (ref 20–29)
Calcium: 9.6 mg/dL (ref 8.7–10.2)
Chloride: 102 mmol/L (ref 96–106)
Creatinine, Ser: 0.96 mg/dL (ref 0.76–1.27)
Globulin, Total: 2.2 g/dL (ref 1.5–4.5)
Glucose: 124 mg/dL — ABNORMAL HIGH (ref 70–99)
Potassium: 4.3 mmol/L (ref 3.5–5.2)
Sodium: 141 mmol/L (ref 134–144)
Total Protein: 7 g/dL (ref 6.0–8.5)
eGFR: 93 mL/min/{1.73_m2} (ref >59)

## 2021-10-13 LAB — TSH: TSH: 1.08 u[IU]/mL (ref 0.450–4.500)

## 2021-10-13 LAB — HEMOGLOBIN A1C
Est. average glucose Bld gHb Est-mCnc: 151 mg/dL
Hgb A1c MFr Bld: 6.9 % — ABNORMAL HIGH (ref 4.8–5.6)

## 2021-10-13 MED ORDER — METOPROLOL SUCCINATE ER 50 MG PO TB24: 50.0000 mg | ORAL_TABLET | Freq: Every day | ORAL | 3 refills | Status: DC

## 2021-10-13 NOTE — Progress Notes (Signed)
Primary Care Provider: [name]., MD [place] Cardiologist: Glenetta Hew, MD Electrophysiologist: None  Clinic Note: Chief Complaint  Patient presents with   Follow-up    Delayed 6 months.  Doing well   Coronary Artery Disease    No angina   ===================================  ASSESSMENT/PLAN   Problem List Items Addressed This Visit       Cardiology Problems   Coronary artery disease, non-occlusive - Primary (Chronic)    Overall doing well.  No angina symptoms.  Nonobstructive disease on cath.  He is done remarkably well with weight loss. As result, we have been on a reduced BP meds.  He has not had labs checked since February but I suspect that they will also be in better as well.  Plan: Recommended daily aspirin, he is not currently taking - I recommended taking 81 mg daily. He is on modest dose Toprol having reduced to 50 mg from 100 mg.  This helped his blood pressure and dizziness. On stable ARB dose. On stable dose of statin, but with some fatigue issues we will have him hold his statin for a month to allow for statin holiday.      Relevant Medications   metoprolol succinate (TOPROL-XL) 50 MG 24 hr tablet   Other Relevant Orders   Hepatic function panel   Lipid panel   Atrial premature contractions (Chronic)    Well-controlled on beta-blocker.      Relevant Medications   metoprolol succinate (TOPROL-XL) 50 MG 24 hr tablet   Essential hypertension (Chronic)    Notably improved blood pressure with weight loss.  We will stop his chlorthalidone and reduce Toprol in half.  Continue Toprol 50 mg daily, losartan 100 mg daily and no longer on diuretic.      Relevant Medications   metoprolol succinate (TOPROL-XL) 50 MG 24 hr tablet   Hypercholesterolemia with hypertriglyceridemia (Chronic)    Lipids were pretty well controlled back in February but triglycerides are still high.  He has not now restarted Vascepa along with  rosuvastatin.  Recheck lipids in about 4 months once we know what statin doses can be on.  His triglycerides are probably high because of diabetes.  He is on insulin regimen with pioglitazone which I would strongly recommend discontinuing and probably consider GLP-1 agonist and possible SGLT2 inhibitor.Marland Kitchen  He is also on metformin.      Relevant Medications   metoprolol succinate (TOPROL-XL) 50 MG 24 hr tablet   Other Relevant Orders   Hepatic function panel   Lipid panel     Other   Metabolic syndrome (Chronic)    Obesity, diabetes and hypertension along with low HDL and high triglycerides essentially all 5 of 5 factors.  Most recent A1c was 6.9.  I do think that if we were to switch him from pioglitazone to GLP-1 agonist, we can probably get him down further.  This would also help with his weight loss and therefore further help lipids and BP. BP seems to be well controlled and LDL is controlled but HDL is still low--the only way to increase this is really exercise.      Relevant Orders   Hepatic function panel   Lipid panel   Obesity, Class I, BMI 30.0-34.9 (see actual BMI) (Chronic)   Relevant Orders   Hepatic function panel   Lipid panel   Fatigue due to treatment    Can include his mild muscle aches and fatigue being related to statin. We will do  a 1 month statin holiday to see how he does.  If no change in symptoms, he will continue on current dose.  Otherwise we would probably consider reducing dose of rosuvastatin which would then mean that we would need probably an additional agent such as Nexletol plus or minus Zetia      Medication management   Relevant Orders   Hepatic function panel   Lipid panel    ===================================  HPI:    Robert Franklin is a 56 y.o. male with a PMH below who presents today for delayed 27-month follow-up, at the request of [name].,*.  PMH notable for OSA (on CPAP-improved with weight loss), history of  nonobstructive CAD by Cath (and Nonischemic Myoview), Dyslipidemia with Hypertriglyceridemia (originally referred to lipid clinic with Dr. Debara Pickett)   Robert Franklin was last seen on 01/27/2021: He had just establish new insurance.  He had been unable to obtain his Vascepa as well as a statin at that time, with new insurance he was.  He had lost about 50 pounds from when he was first seen.  Blood sugars remain much better controlled.  They have been way up into the 300s and he was getting better control with weight loss.  Had just had a treadmill delivered to his house, with plans to initiate an exercise program..  Stable car standpoint.  No angina or heart failure symptoms.  Just musculoskeletal aches and pains.  Mild and a edema but no PND, or orthopnea.  Was not using his CPAP because of weight loss. No palpitations, syncope or near syncope.  No TIA or orthostatic symptoms.  No claudication.  Recent Hospitalizations: . 2 ER visits in January 2023 for viral illness symptoms, general body aches.  Otherwise no admissions.  He was just seen by Dr. Rosanna Randy at [facility]: Noted working hard on diet and exercise.  Noted blood pressures running a little low-he had contacted our office and we told him to decrease Toprol to 50 mg and stop chlorthalidone.  BP was looking much better.  Reviewed  CV studies:    The following studies were reviewed today: (if available, images/films reviewed: From Epic Chart or Care Everywhere) None:  Interval History:   Robert Franklin returns here today for 38-month follow-up doing pretty well.  He is lost another 4 pounds and feeling pretty happy with himself. 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  Now he is able to potentially have time to do some more exercise.  He is more active now eating better.  Able to control his diet.  He is definitely working on reducing his carbs and increasing exercise.  He feels better when he is doing more  exercise. His blood pressure is doing a whole lot better having stopped chlorthalidone and use beta-blocker.  He is still taking his losartan though.  No further syncope or near syncope.  No heart failure symptoms of PND orthopnea edema. He says his weight was down even further but he had a mild joint injury that kept him from doing his routine exercises for a little while.  He is now back in the routine.  He is pretty in tune with his anginal symptoms and is pretty sure he has not had any recurrent angina symptoms at all.  He has had some mild musculoskeletal discomfort off and around then.  He also says that his back pain and other muscle aches have improved with weight loss.  He notes less GERD.  CV Review of Symptoms (Summary): no chest pain or dyspnea on exertion positive for - lightheadedness and dizziness with low blood pressures prior to medication changes but now notably improved.  Intended weight loss, dietary changes, and exercise.. negative for - edema, irregular heartbeat, orthopnea, palpitations, paroxysmal nocturnal dyspnea, rapid heart rate, shortness of breath, or syncope/near syncope or TIA/amaurosis fugax, claudication  REVIEWED OF SYSTEMS   Review of Systems  Constitutional:  Positive for malaise/fatigue (He does have some fatigue and muscle weakness that he was initially just contributing to change in weight and loss of muscle mass but he just wants to be sure.) and weight loss (Was over 260 pounds when I first met him, is now down to 218).  HENT:  Negative for congestion and nosebleeds.   Respiratory:  Negative for cough and shortness of breath.   Gastrointestinal:  Negative for blood in stool, constipation, heartburn (Much better with weight loss) and melena.  Genitourinary:  Negative for hematuria.  Musculoskeletal:  Positive for joint pain (Hip and knee pain is notably improved with weight loss.  Also with increased exercise.).  Neurological:  Positive for dizziness (He  still gets dizzy sometimes with leaning over and standing back up again, but this is definitely improved since changing medications.). Negative for weakness.  Psychiatric/Behavioral: Negative.      I have reviewed and (if needed) personally updated the patient's problem list, medications, allergies, past medical and surgical history, social and family history.   PAST MEDICAL HISTORY   Past Medical History:  Diagnosis Date   Coronary artery disease, non-occlusive 02/28/2008   Dr. Humphrey Rolls @ [facility]: 2 x 40-50% lesions in dominant LCx lesion by cath,  EF 60% with normal wall motion.;  Follow catheterization January 2021 showed stable findings.  LCx lesions likely not stenoses, more normal diameter vessel in setting of diffuse ectasia/aneurysm.   Diabetes mellitus without complication ([place])    Essential hypertension    GERD (gastroesophageal reflux disease)    Headache    High triglycerides     was previously on treatment, but  he notes that this was stopped   Kidney stones    Moderate obesity 05/29/2014   OSA treated with BiPAP    Short bowel syndrome 1994    PAST SURGICAL HISTORY   Past Surgical History:  Procedure Laterality Date   "stomach wrap"  1991   GERD   APPENDECTOMY     CARDIAC CATHETERIZATION  02/2008   Dr. Humphrey Rolls @ [facility]: 2 x 40-50% lesions in a dominant LCx by cath,  EF 60% with normal wall motion.   Cardiolite Nuclear Stress Test  01/2008    heart rate increased from 80-158 BPM. No EKG changes.  there is a moderate sized  reversible defect in the inferior and inferior lateral wall as well as anteroseptal. EF 59%.   CardioNet Holter Monitor  01/2008    sinus rhythm. Rare PVCs in setting was. Current PACs , seen in singles and pairs. Max heart rate 127, minimum heart rate 57. Average 84.   CHOLECYSTECTOMY     COLON SURGERY     due to gangrenous appendix   COLONOSCOPY WITH PROPOFOL N/A 04/26/2016   Procedure: COLONOSCOPY WITH PROPOFOL;  Surgeon: Jonathon Bellows, MD;  Location: [facility]  ENDOSCOPY;  Service: Endoscopy;  Laterality: N/A;   ESOPHAGOGASTRODUODENOSCOPY (EGD) WITH PROPOFOL N/A 01/11/2019   Procedure: ESOPHAGOGASTRODUODENOSCOPY (EGD) WITH PROPOFOL;  Surgeon: Jonathon Bellows, MD;  Location: [facility] ENDOSCOPY;  Service: Gastroenterology;  Laterality: N/A;   FLEXIBLE SIGMOIDOSCOPY N/A  01/11/2019   Procedure: FLEXIBLE SIGMOIDOSCOPY;  Surgeon: Jonathon Bellows, MD;  Location: [facility] ENDOSCOPY;  Service: Gastroenterology;  Laterality: N/A;   HERNIA REPAIR  2010   INNER EAR SURGERY     LEFT HEART CATH AND CORONARY ANGIOGRAPHY N/A 02/12/2019   Procedure: LEFT HEART CATH AND CORONARY ANGIOGRAPHY;  Surgeon: Leonie Man, MD;  Location: [place] CV LAB;  Service: Cardiovascular; Proximal RCA 40%.  Diffuse aneurysmal/ectatic (patulous) dominant LCx with no significant lesions.  Questionable 40 to 50% stenosis-most likely actually just normal diameter with surrounding ectasia/aneurysmal segments.  EF 55 to 60%.  Normal EDP.   NM MYOVIEW LTD  01/10/2019   Treadmill Myoview: Walk 9:40 min-10.1 METS) EF ~45 to 50%.  INTERMEDIATE RISK STUDY-moderate sized mostly fixed perfusion defect in anterior apical segment.  No reversibility.  Suggests prior infarct without ischemia.   TONSILLECTOMY     TYMPANOSTOMY TUBE PLACEMENT Bilateral 1973    Immunization History  Administered Date(s) Administered   Tdap 01/28/2010, 08/11/2020    MEDICATIONS/ALLERGIES   Current Meds  Medication Sig   BIOTIN PO Take 10,000 mcg by mouth 2 (two) times daily.   bismuth subsalicylate (PEPTO BISMOL) 262 MG/15ML suspension Take 30 mLs by mouth every 6 (six) hours as needed for indigestion.   butalbital-acetaminophen-caffeine (FIORICET) 50-325-40 MG tablet TAKE 1 TABLET BY MOUTH 2 (TWO) TIMES DAILY AS NEEDED FOR HEADACHE.   Charcoal Activated (ACTIVATED CHARCOAL PO) Take 260 mg by mouth 2 (two) times daily.   CINNAMON PO Take 1,200 mg by mouth daily.   Continuous Blood Gluc Sensor (FREESTYLE LIBRE 3 SENSOR) MISC 1  each by Does not apply route daily. Place 1 sensor on the skin every 14 days. Use to check glucose continuously   losartan (COZAAR) 100 MG tablet TAKE 1 TABLET BY MOUTH EVERY DAY. TAKE WITH HYDROCHLOROTHIAZIDE   Magnesium 100 MG CAPS Take 200 mg by mouth daily.   Menthol, Topical Analgesic, (BIOFREEZE EX) Apply 1 application topically daily as needed (pain).   metFORMIN (GLUCOPHAGE) 1000 MG tablet TAKE 1 TABLET (1,000 MG TOTAL) BY MOUTH 2 (TWO) TIMES DAILY WITH A MEAL.   metoprolol succinate (TOPROL-XL) 50 MG 24 hr tablet Take 1 tablet (50 mg total) by mouth daily. Take with or immediately following a meal.   omeprazole (PRILOSEC OTC) 20 MG tablet Take 1 tablet (20 mg total) by mouth daily. (Patient taking differently: Take 20 mg by mouth as needed.)   pioglitazone (ACTOS) 30 MG tablet TAKE 1 TABLET BY MOUTH EVERY DAY   Potassium 99 MG TABS Take 99 mg by mouth daily.   Probiotic Product (ALIGN) 4 MG CAPS Take 4 mg by mouth daily.   rosuvastatin (CRESTOR) 20 MG tablet Take 1 tablet (20 mg total) by mouth daily.   Saw Palmetto, Serenoa repens, (SAW PALMETTO PO) Take 1,400 mg by mouth daily.   Simethicone 125 MG CAPS Take 125 mg by mouth daily as needed (gas).   tadalafil (CIALIS) 20 MG tablet TAKE 1 TABLET (20 MG TOTAL) BY MOUTH EVERY THREE (3) DAYS AS NEEDED FOR ERECTILE DYSFUNCTION.   TURMERIC PO Take 2,600 mg by mouth 2 (two) times daily.   VASCEPA 1 g capsule TAKE 2 CAPSULES (2 G TOTAL) BY MOUTH 2 (TWO) TIMES DAILY.   vitamin C (ASCORBIC ACID) 250 MG tablet Take 500 mg by mouth daily.   [DISCONTINUED] metoprolol succinate (TOPROL-XL) 100 MG 24 hr tablet TAKE 1 TABLET BY MOUTH DAILY. TAKE WITH OR IMMEDIATELY FOLLOWING A MEAL. 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   Caffeine use: once or twice a week   Lives with wife, mother and grandmother    OBJCTIVE -PE, EKG, labs   Wt Readings from Last 3 Encounters:  10/13/21 218 lb (98.9 kg)  10/12/21 218 lb (98.9 kg)  06/30/21 213 lb (96.6 kg)    Physical Exam: BP 138/76   Pulse 65   Ht $R'5\' 9"'mW$  (1.753 m)   Wt 218 lb (98.9 kg)   SpO2 99%   BMI 32.19 kg/m  Physical Exam Vitals reviewed.  Constitutional:      General: He is not in acute distress.    Appearance: He is obese. He is not ill-appearing or toxic-appearing.     Comments: Obese but notably healthy appearing than before.  Well-groomed.  HENT:     Head: Normocephalic and atraumatic.  Neck:     Vascular: No carotid bruit or JVD.  Cardiovascular:     Rate and Rhythm: Normal rate and regular rhythm. No extrasystoles are present.    Chest Wall: PMI is not displaced.     Pulses: Normal pulses. No decreased pulses.     Heart sounds: S1 normal and S2 normal. Heart sounds are distant. No murmur heard.    No gallop. No S4 sounds.  Pulmonary:     Effort: Pulmonary effort is normal. No respiratory distress.     Breath sounds: Normal breath sounds. No wheezing or rales.  Chest:     Chest wall: No tenderness.  Musculoskeletal:        General: No swelling. Normal range of motion.     Cervical back: Normal range of motion and neck supple.  Skin:    General: Skin is warm and dry.  Neurological:     General: No focal deficit present.     Mental Status: He is alert and oriented to person, place, and time.  Psychiatric:        Mood and  Affect: Mood normal.        Behavior: Behavior normal.        Thought Content: Thought content normal.        Judgment: Judgment normal.     Adult ECG Report N/A  Recent Labs:  reviewed   Lab Results  Component Value Date   CHOL 102 10/12/2021   HDL 27 (L) 10/12/2021   LDLCALC 42 10/12/2021   LDLDIRECT 46 07/05/2018   TRIG 206 (H) 10/12/2021   CHOLHDL 3.8 10/12/2021   Lab Results  Component Value Date   CREATININE 0.96 10/12/2021   BUN 16 10/12/2021   NA 141 10/12/2021   K 4.3 10/12/2021   CL 102 10/12/2021   CO2 23  10/12/2021      Latest Ref Rng & Units 10/12/2021    4:37 PM 03/22/2021    4:29 PM 08/11/2020    4:13 PM  CBC  WBC 3.4 - 10.8 x10E3/uL 8.6  9.0  8.5   Hemoglobin 13.0 - 17.7 g/dL 14.8  15.9  15.7   Hematocrit 37.5 - 51.0 % 43.1  44.8  48.0   Platelets 150 - 450 x10E3/uL 203  217  212     Lab Results  Component Value Date   HGBA1C 6.9 (H) 10/12/2021   Lab Results  Component Value Date   TSH 1.080 10/12/2021    ================================================== I spent a total of 25 minutes with the patient spent in direct patient consultation.  Additional time spent with chart review  / charting (studies, outside notes, etc): 16 min Total Time: 41 min  Current medicines are reviewed at length with the patient today.  (+/- concerns) none  Notice: This dictation was prepared with Dragon dictation along with smart phrase technology. Any transcriptional errors that result from this process are unintentional and may not be corrected upon review.  Studies Ordered:   Orders Placed This Encounter  Procedures   Hepatic function panel   Lipid panel   Meds ordered this encounter  Medications   metoprolol succinate (TOPROL-XL) 50 MG 24 hr tablet    Sig: Take 1 tablet (50 mg total) by mouth daily. Take with or immediately following a meal.    Dispense:  90 tablet    Refill:  3    Discontinue  the 100 mg tablet    Patient Instructions / Medication  Changes & Studies & Tests Ordered   Patient Instructions  Medication Instructions:   Statin holiday   - for one month  do not take  Rosuvastatin if your memory and fogginess gets better decrease to have tablet daily , if no change then restart the whole dose.   *If you need a refill on your cardiac medications before your next appointment, please call your pharmacy*   Lab Work:fasting  Lipids liver panel in 4 months  If you have labs (blood work) drawn today and your tests are completely normal, you will receive your results only by: [place] (if you have MyChart) OR A paper copy in the mail If you have any lab test that is abnormal or we need to change your treatment, we will call you to review the results.   Testing/Procedures: Not needed   Follow-Up: At [facility], you and your health needs are our priority.  As part of our continuing mission to provide you with exceptional heart care, we have created designated Provider Care Teams.  These Care Teams include your primary Cardiologist (physician) and Advanced Practice Providers (APPs -  Physician Assistants and Nurse Practitioners) who all work together to provide you with the care you need, when you need it.  We recommend signing up for the patient portal called "MyChart".  Sign up information is provided on this After Visit Summary.  MyChart is used to connect with patients for Virtual Visits (Telemedicine).  Patients are able to view lab/test results, encounter notes, upcoming appointments, etc.  Non-urgent messages can be sent to your provider as well.   To learn more about what you can do with MyChart, go to [email or web address].    Your next appointment:   6 month(s)  The format for your next appointment:   In Person  Provider:   Glenetta Hew, MD  Leonie Man, MD, MS Glenetta Hew, M.D., M.S. Interventional Cardiologist  [place]  Pager # [phone] Phone #  [phone] [address]. [place], [place] [address]   Thank you for choosing [facility] [place]!!

## 2021-10-13 NOTE — Patient Instructions (Addendum)
Medication Instructions:   Statin holiday   - for one month  do not take  Rosuvastatin if your memory and fogginess gets better decrease to have tablet daily , if no change then restart the whole dose.   *If you need a refill on your cardiac medications before your next appointment, please call your pharmacy*   Lab Work:fasting  Lipids liver panel in 4 months  If you have labs (blood work) drawn today and your tests are completely normal, you will receive your results only by: [place] (if you have MyChart) OR A paper copy in the mail If you have any lab test that is abnormal or we need to change your treatment, we will call you to review the results.   Testing/Procedures: Not needed   Follow-Up: At [facility], you and your health needs are our priority.  As part of our continuing mission to provide you with exceptional heart care, we have created designated Provider Care Teams.  These Care Teams include your primary Cardiologist (physician) and Advanced Practice Providers (APPs -  Physician Assistants and Nurse Practitioners) who all work together to provide you with the care you need, when you need it.  We recommend signing up for the patient portal called "MyChart".  Sign up information is provided on this After Visit Summary.  MyChart is used to connect with patients for Virtual Visits (Telemedicine).  Patients are able to view lab/test results, encounter notes, upcoming appointments, etc.  Non-urgent messages can be sent to your provider as well.   To learn more about what you can do with MyChart, go to [email or web address].    Your next appointment:   6 month(s)  The format for your next appointment:   In Person  Provider:   Glenetta Hew, MD

## 2021-10-24 ENCOUNTER — Encounter: Payer: Self-pay | Admitting: Cardiology

## 2021-10-24 DIAGNOSIS — R5383 Other fatigue: Secondary | ICD-10-CM | POA: Insufficient documentation

## 2021-10-24 NOTE — Assessment & Plan Note (Signed)
Can include his mild muscle aches and fatigue being related to statin. We will do a 1 month statin holiday to see how he does.  If no change in symptoms, he will continue on current dose.  Otherwise we would probably consider reducing dose of rosuvastatin which would then mean that we would need probably an additional agent such as Nexletol plus or minus Zetia

## 2021-10-24 NOTE — Assessment & Plan Note (Signed)
Obesity, diabetes and hypertension along with low HDL and high triglycerides essentially all 5 of 5 factors.  Most recent A1c was 6.9.  I do think that if we were to switch him from pioglitazone to GLP-1 agonist, we can probably get him down further.  This would also help with his weight loss and therefore further help lipids and BP. BP seems to be well controlled and LDL is controlled but HDL is still low--the only way to increase this is really exercise.

## 2021-10-24 NOTE — Assessment & Plan Note (Signed)
Overall doing well.  No angina symptoms.  Nonobstructive disease on cath.  He is done remarkably well with weight loss. As result, we have been on a reduced BP meds.  He has not had labs checked since February but I suspect that they will also be in better as well.  Plan:  Recommended daily aspirin, he is not currently taking - I recommended taking 81 mg daily.  He is on modest dose Toprol having reduced to 50 mg from 100 mg.  This helped his blood pressure and dizziness.  On stable ARB dose.  On stable dose of statin, but with some fatigue issues we will have him hold his statin for a month to allow for statin holiday.

## 2021-10-24 NOTE — Assessment & Plan Note (Signed)
Well-controlled on beta-blocker.

## 2021-10-24 NOTE — Assessment & Plan Note (Addendum)
Lipids were pretty well controlled back in February but triglycerides are still high.  He has not now restarted Vascepa along with rosuvastatin.  Recheck lipids in about 4 months once we know what statin doses can be on.  His triglycerides are probably high because of diabetes.  He is on insulin regimen with pioglitazone which I would strongly recommend discontinuing and probably consider GLP-1 agonist and possible SGLT2 inhibitor.Marland Kitchen  He is also on metformin.

## 2021-10-24 NOTE — Assessment & Plan Note (Signed)
Notably improved blood pressure with weight loss.  We will stop his chlorthalidone and reduce Toprol in half.  Continue Toprol 50 mg daily, losartan 100 mg daily and no longer on diuretic.

## 2021-11-01 ENCOUNTER — Telehealth: Payer: Self-pay | Admitting: Cardiology

## 2021-11-01 MED ORDER — CHLORTHALIDONE 25 MG PO TABS: 25.0000 mg | ORAL_TABLET | Freq: Every day | ORAL | 1 refills | Status: DC

## 2021-11-01 NOTE — Telephone Encounter (Signed)
Patient stated that this morning BP was 155/100, P 66. He is taking losartan '100mg'$  daily and metoprolol succinate '50mg'$  daily. At his last appointment, chlorthalidone '25mg'$  daily was stopped. He is currently on a Crestor holiday (2 weeks) and has less fatigue. BP while on phone 155/106, P 67. Dr. Ellyn Hack advised. Order to restart chlorthalidone '25mg'$  daily. Informed patient to start taking chlorthalidone '25mg'$  daily and to continue to keep a BP/P diary. Patient verbalized understanding.

## 2021-11-01 NOTE — Telephone Encounter (Signed)
Pt c/o medication issue:  1. Name of Medication: rosuvastatin (CRESTOR) 20 MG tablet (Expired)  2. How are you currently taking this medication (dosage and times per day)? Not taking currently   3. Are you having a reaction (difficulty breathing--STAT)?   4. What is your medication issue? Patient was taken off medication due to drop in BP, but he now states he believes he may need to go back on but with a lower dose due to increase in BP. Requesting call back.   155/100 HR 66

## 2022-01-03 ENCOUNTER — Other Ambulatory Visit: Payer: Self-pay | Admitting: Cardiology

## 2022-01-25 ENCOUNTER — Ambulatory Visit
Admission: EM | Admit: 2022-01-25 | Discharge: 2022-01-25 | Disposition: A | Discharge status: Home / Self Care | Payer: BLUE CROSS/BLUE SHIELD | Attending: Family Medicine | Admitting: Family Medicine

## 2022-01-25 ENCOUNTER — Ambulatory Visit (INDEPENDENT_AMBULATORY_CARE_PROVIDER_SITE_OTHER): Payer: BC Managed Care – PPO

## 2022-01-25 DIAGNOSIS — R0600 Dyspnea, unspecified: Secondary | ICD-10-CM

## 2022-01-25 DIAGNOSIS — Z1152 Encounter for screening for COVID-19: Secondary | ICD-10-CM | POA: Insufficient documentation

## 2022-01-25 DIAGNOSIS — J069 Acute upper respiratory infection, unspecified: Secondary | ICD-10-CM

## 2022-01-25 DIAGNOSIS — J029 Acute pharyngitis, unspecified: Secondary | ICD-10-CM | POA: Insufficient documentation

## 2022-01-25 MED ORDER — AMOXICILLIN 875 MG PO TABS: 875.0000 mg | ORAL_TABLET | Freq: Two times a day (BID) | ORAL | 0 refills | Status: AC

## 2022-01-25 MED ORDER — IBUPROFEN 800 MG PO TABS: 800.0000 mg | ORAL_TABLET | Freq: Three times a day (TID) | ORAL | 0 refills | Status: AC | PRN

## 2022-01-25 MED ORDER — ONDANSETRON 4 MG PO TBDP: 4.0000 mg | ORAL_TABLET | Freq: Three times a day (TID) | ORAL | 0 refills | Status: DC | PRN

## 2022-01-25 MED ORDER — BENZONATATE 100 MG PO CAPS: 100.0000 mg | ORAL_CAPSULE | Freq: Three times a day (TID) | ORAL | 0 refills | Status: DC | PRN

## 2022-01-25 NOTE — ED Triage Notes (Signed)
Pt c/o prod cough, nasal congestion, sore throat at night, body aches x day 5-reports neg covid home test-NAD-steady gait

## 2022-01-25 NOTE — Discharge Instructions (Signed)
Your chest x-ray was clear and did not show any pneumonia or fluid  Take amoxicillin 875 mg--1 tab twice daily for 7 days  Ondansetron dissolved in the mouth every 8 hours as needed for nausea or vomiting. Clear liquids and bland things to eat.  Take benzonatate 100 mg, 1 tab every 8 hours as needed for cough.   You have been swabbed for COVID, and the test will result in the next 24 hours. Our staff will call you if positive. If the COVID test is positive, you should quarantine for 5 days from the start of your symptoms

## 2022-01-25 NOTE — ED Provider Notes (Addendum)
[facility]    CSN: [id number] Arrival date & time: 01/25/22  1203      History   Chief Complaint Chief Complaint  Patient presents with   Cough    HPI Robert Franklin is a 57 y.o. male.    Cough  Here for rhinorrhea, cough, and sore throat.  Symptoms began on December 29.  He has not had fever at any point.  Last night he does note feeling short of breath.  He has had some decreased appetite and nausea.  No diarrhea.  He has had chills.  Has a history of diabetes and his sugars have been high since this started.  He has had some chronic ear pain, but they have been hurting more in the last few days   Past Medical History:  Diagnosis Date   Coronary artery disease, non-occlusive 02/28/2008   Dr. Humphrey Rolls @ [place]: 2 x 40-50% lesions in dominant LCx lesion by cath,  EF 60% with normal wall motion.;  Follow catheterization January 2021 showed stable findings.  LCx lesions likely not stenoses, more normal diameter vessel in setting of diffuse ectasia/aneurysm.   Diabetes mellitus without complication ([place])    Essential hypertension    GERD (gastroesophageal reflux disease)    Headache    High triglycerides     was previously on treatment, but  he notes that this was stopped   Kidney stones    Moderate obesity 05/29/2014   OSA treated with BiPAP    Short bowel syndrome 1994    Patient Active Problem List   Diagnosis Date Noted   Fatigue due to treatment 10/24/2021   Worsening headaches 04/30/2019   Neck pain 04/30/2019   Bilateral occipital neuralgia 04/30/2019   Migraine without aura and without status migrainosus, not intractable 04/30/2019   Vertigo 04/30/2019   Abnormal nuclear stress test 02/07/2019   Atypical angina 02/07/2019   Central adiposity 08/10/2018   DM (diabetes mellitus) ([place]) 07/08/2018   Medication management 01/01/2016   Hypercholesterolemia with hypertriglyceridemia 02/26/2015   Abnormally low high density lipoprotein (HDL)  cholesterol with hypertriglyceridemia 02/26/2015   Atrial premature contractions 00/34/9179   Metabolic syndrome 15/05/6977   Rapid heart beat 02/26/2015   Signs and symptoms involving emotional state 05/29/2014   Anxiety 05/29/2014   Adult BMI 30+ 05/29/2014   Clinical depression 05/29/2014   Failure of erection 05/29/2014   Abnormal LFTs 05/29/2014   Blood glucose elevated 05/29/2014   GERD (gastroesophageal reflux disease) 05/29/2014   Combined fat and carbohydrate induced hyperlipemia 05/29/2014   Essential hypertension 05/29/2014   Headache, migraine 05/29/2014   Obesity, Class I, BMI 30.0-34.9 (see actual BMI) 05/29/2014   Gastroduodenal ulcer 05/29/2014   Apnea, sleep 48/01/6551   Umbilical hernia without obstruction and without gangrene 05/29/2014   Avitaminosis D 09/14/2009   Coronary artery disease, non-occlusive 02/27/2009    Past Surgical History:  Procedure Laterality Date   "stomach wrap"  1991   GERD   APPENDECTOMY     CARDIAC CATHETERIZATION  02/2008   Dr. Humphrey Rolls @ [facility]: 2 x 40-50% lesions in a dominant LCx by cath,  EF 60% with normal wall motion.   Cardiolite Nuclear Stress Test  01/2008    heart rate increased from 80-158 BPM. No EKG changes.  there is a moderate sized  reversible defect in the inferior and inferior lateral wall as well as anteroseptal. EF 59%.   CardioNet Holter Monitor  01/2008    sinus rhythm. Rare PVCs in setting was.  Current PACs , seen in singles and pairs. Max heart rate 127, minimum heart rate 57. Average 84.   CHOLECYSTECTOMY     COLON SURGERY     due to gangrenous appendix   COLONOSCOPY WITH PROPOFOL N/A 04/26/2016   Procedure: COLONOSCOPY WITH PROPOFOL;  Surgeon: Jonathon Bellows, MD;  Location: [facility] ENDOSCOPY;  Service: Endoscopy;  Laterality: N/A;   ESOPHAGOGASTRODUODENOSCOPY (EGD) WITH PROPOFOL N/A 01/11/2019   Procedure: ESOPHAGOGASTRODUODENOSCOPY (EGD) WITH PROPOFOL;  Surgeon: Jonathon Bellows, MD;  Location: [facility] ENDOSCOPY;  Service:  Gastroenterology;  Laterality: N/A;   FLEXIBLE SIGMOIDOSCOPY N/A 01/11/2019   Procedure: FLEXIBLE SIGMOIDOSCOPY;  Surgeon: Jonathon Bellows, MD;  Location: [facility] ENDOSCOPY;  Service: Gastroenterology;  Laterality: N/A;   HERNIA REPAIR  2010   INNER EAR SURGERY     LEFT HEART CATH AND CORONARY ANGIOGRAPHY N/A 02/12/2019   Procedure: LEFT HEART CATH AND CORONARY ANGIOGRAPHY;  Surgeon: Leonie Man, MD;  Location: [facility];  Service: Cardiovascular; Proximal RCA 40%.  Diffuse aneurysmal/ectatic (patulous) dominant LCx with no significant lesions.  Questionable 40 to 50% stenosis-most likely actually just normal diameter with surrounding ectasia/aneurysmal segments.  EF 55 to 60%.  Normal EDP.   NM MYOVIEW LTD  01/10/2019   Treadmill Myoview: Walk 9:40 min-10.1 METS) EF ~45 to 50%.  INTERMEDIATE RISK STUDY-moderate sized mostly fixed perfusion defect in anterior apical segment.  No reversibility.  Suggests prior infarct without ischemia.   TONSILLECTOMY     TYMPANOSTOMY TUBE PLACEMENT Bilateral 1973       Home Medications    Prior to Admission medications   Medication Sig Start Date End Date Taking? Authorizing Provider  amoxicillin (AMOXIL) 875 MG tablet Take 1 tablet (875 mg total) by mouth 2 (two) times daily for 7 days. 01/25/22 02/01/22 Yes Jamyrah Saur, Gwenlyn Perking, MD  benzonatate (TESSALON) 100 MG capsule Take 1 capsule (100 mg total) by mouth 3 (three) times daily as needed for cough. 01/25/22  Yes Barrett Henle, MD  ondansetron (ZOFRAN-ODT) 4 MG disintegrating tablet Take 1 tablet (4 mg total) by mouth every 8 (eight) hours as needed for nausea or vomiting. 01/25/22  Yes Barrett Henle, MD  BIOTIN PO Take 10,000 mcg by mouth 2 (two) times daily.    [provider]  bismuth subsalicylate (PEPTO BISMOL) 262 MG/15ML suspension Take 30 mLs by mouth every 6 (six) hours as needed for indigestion.    [provider]  butalbital-acetaminophen-caffeine (FIORICET) 50-325-40  MG tablet TAKE 1 TABLET BY MOUTH 2 (TWO) TIMES DAILY AS NEEDED FOR HEADACHE. 08/14/20   [name]., MD  Charcoal Activated (ACTIVATED CHARCOAL PO) Take 260 mg by mouth 2 (two) times daily.    [provider]  chlorthalidone (HYGROTON) 25 MG tablet Take 1 tablet (25 mg total) by mouth daily. 11/01/21   Leonie Man, MD  CINNAMON PO Take 1,200 mg by mouth daily.    [provider]  Continuous Blood Gluc Sensor (FREESTYLE LIBRE 3 SENSOR) MISC 1 each by Does not apply route daily. Place 1 sensor on the skin every 14 days. Use to check glucose continuously 03/22/21   [name]., MD  losartan (COZAAR) 100 MG tablet TAKE 1 TABLET BY MOUTH EVERY DAY. TAKE WITH HYDROCHLOROTHIAZIDE 06/16/21   [name]., MD  Magnesium 100 MG CAPS Take 200 mg by mouth daily.    [provider]  Menthol, Topical Analgesic, (BIOFREEZE EX) Apply 1 application topically daily as needed (pain).  [provider]  metFORMIN (GLUCOPHAGE) 1000 MG tablet TAKE 1 TABLET (1,000 MG TOTAL) BY MOUTH 2 (TWO) TIMES DAILY WITH A MEAL. 03/11/21   [name]., MD  metoprolol succinate (TOPROL-XL) 50 MG 24 hr tablet Take 1 tablet (50 mg total) by mouth daily. Take with or immediately following a meal. 10/13/21   Leonie Man, MD  omeprazole (PRILOSEC OTC) 20 MG tablet Take 1 tablet (20 mg total) by mouth daily. Patient taking differently: Take 20 mg by mouth as needed. 05/14/15   [name]., MD  pioglitazone (ACTOS) 30 MG tablet TAKE 1 TABLET BY MOUTH EVERY DAY 09/24/21   [name]., MD  Potassium 99 MG TABS Take 99 mg by mouth daily.    [provider]  Probiotic Product (ALIGN) 4 MG CAPS Take 4 mg by mouth daily.    [provider]  rosuvastatin (CRESTOR) 20 MG tablet TAKE 1 TABLET BY MOUTH EVERY DAY 01/05/22   Leonie Man, MD  Saw Palmetto, Serenoa repens, (SAW PALMETTO PO) Take 1,400 mg by mouth daily.    [provider]  Simethicone 125 MG CAPS Take 125 mg by mouth daily as needed (gas).    [provider]  tadalafil (CIALIS) 20 MG tablet TAKE 1 TABLET (20 MG TOTAL) BY MOUTH EVERY THREE (3) DAYS AS NEEDED FOR ERECTILE DYSFUNCTION. 07/09/21   [name]., MD  TURMERIC PO Take 2,600 mg by mouth 2 (two) times daily.    [provider]  VASCEPA 1 g capsule TAKE 2 CAPSULES BY MOUTH 2 TIMES DAILY. 01/05/22   Leonie Man, MD  vitamin C (ASCORBIC ACID) 250 MG tablet Take 500 mg by mouth daily.    [provider]    Family History Family History  Problem Relation Age of Onset   Hypertension Mother    Irritable bowel syndrome Mother    Migraines Mother    Lumbar disc disease Mother    Heart attack Mother    Heart attack Maternal Uncle    Heart attack Maternal Grandfather     Social History Social History   Tobacco Use   Smoking status: Former    Packs/day: 1.00    Years: 28.00    Total pack years: 28.00    Types: Cigarettes    Quit date: 12/30/2005    Years since quitting: 16.0   Smokeless tobacco: Never  Vaping Use   Vaping Use: Never used  Substance Use Topics   Alcohol use: Yes    Comment: occ   Drug use: Not Currently     Allergies   Patient has no known allergies.   Review of Systems Review of Systems  Respiratory:  Positive for cough.      Physical Exam Triage Vital Signs ED Triage Vitals  Enc Vitals Group     BP 01/25/22 1213 (!) 144/90     Pulse Rate 01/25/22 1213 74     Resp 01/25/22 1213 16     Temp 01/25/22 1213 98.3 F (36.8 C)     Temp Source 01/25/22 1213 Oral     SpO2 01/25/22 1213 96 %     Weight --      Height --      Head Circumference --      Peak Flow --      Pain Score 01/25/22 1219 6     Pain Loc --      Pain Edu? --  Excl. in GC? --    No data found.  Updated Vital Signs BP (!) 144/90 (BP Location: Left Arm)   Pulse 74   Temp 98.3 F (36.8 C) (Oral)   Resp 16   SpO2 96%   Visual  Acuity Right Eye Distance:   Left Eye Distance:   Bilateral Distance:    Right Eye Near:   Left Eye Near:    Bilateral Near:     Physical Exam Vitals reviewed.  Constitutional:      General: He is not in acute distress.    Appearance: He is not toxic-appearing.  HENT:     Right Ear: Ear canal normal.     Left Ear: Ear canal normal.     Ears:     Comments: Left tympanic membrane is red and dull with altered landmarks.  Right tympanic membrane is gray and dull.    Nose: Congestion present.     Mouth/Throat:     Mouth: Mucous membranes are moist.     Comments: There is mild erythema of the posterior oropharynx.  He has had a tonsillectomy.  No asymmetry Eyes:     Extraocular Movements: Extraocular movements intact.     Conjunctiva/sclera: Conjunctivae normal.     Pupils: Pupils are equal, round, and reactive to light.  Cardiovascular:     Rate and Rhythm: Normal rate and regular rhythm.     Heart sounds: No murmur heard. Pulmonary:     Effort: No respiratory distress.     Breath sounds: No stridor. No wheezing, rhonchi or rales.     Comments: There is possibly some decreased breath sounds in his right axilla. Musculoskeletal:     Cervical back: Neck supple.  Lymphadenopathy:     Cervical: No cervical adenopathy.  Skin:    Capillary Refill: Capillary refill takes less than 2 seconds.     Coloration: Skin is not jaundiced or pale.  Neurological:     General: No focal deficit present.     Mental Status: He is alert and oriented to person, place, and time.  Psychiatric:        Behavior: Behavior normal.      UC Treatments / Results  Labs (all labs ordered are listed, but only abnormal results are displayed) Labs Reviewed  SARS CORONAVIRUS 2 (TAT 6-24 HRS)    EKG   Radiology DG Chest 2 View  Result Date: 01/25/2022 CLINICAL DATA:  Cough, shortness of breath EXAM: CHEST - 2 VIEW COMPARISON:  02/02/2021 FINDINGS: The heart size and mediastinal contours are within  normal limits. Both lungs are clear. The visualized skeletal structures are unremarkable. Surgical clips are seen in right upper quadrant of abdomen in gallbladder fossa. IMPRESSION: No active cardiopulmonary disease. Electronically Signed   By: Elmer Picker M.D.   On: 01/25/2022 12:54    Procedures Procedures (including critical care time)  Medications Ordered in UC Medications - No data to display  Initial Impression / Assessment and Plan / UC Course  I have reviewed the triage vital signs and the nursing notes.  Pertinent labs & imaging results that were available during my care of the patient were reviewed by me and considered in my medical decision making (see chart for details).        Chest x-ray is clear.  I am going to treat the ear infection with antibiotics.  COVID testing is done today, and if positive his last EGFR was 93 in September 2023.  He is a candidate therefore  for Paxlovid, as today is day 4. Final Clinical Impressions(s) / UC Diagnoses   Final diagnoses:  Viral URI with cough  Dyspnea, unspecified type     Discharge Instructions      Your chest x-ray was clear and did not show any pneumonia or fluid  Take amoxicillin 875 mg--1 tab twice daily for 7 days  Ondansetron dissolved in the mouth every 8 hours as needed for nausea or vomiting. Clear liquids and bland things to eat.  Take benzonatate 100 mg, 1 tab every 8 hours as needed for cough.   You have been swabbed for COVID, and the test will result in the next 24 hours. Our staff will call you if positive. If the COVID test is positive, you should quarantine for 5 days from the start of your symptoms      ED Prescriptions     Medication Sig Dispense Auth. Provider   amoxicillin (AMOXIL) 875 MG tablet Take 1 tablet (875 mg total) by mouth 2 (two) times daily for 7 days. 14 tablet Lanasia Porras, Gwenlyn Perking, MD   benzonatate (TESSALON) 100 MG capsule Take 1 capsule (100 mg total) by mouth 3  (three) times daily as needed for cough. 21 capsule Barrett Henle, MD   ondansetron (ZOFRAN-ODT) 4 MG disintegrating tablet Take 1 tablet (4 mg total) by mouth every 8 (eight) hours as needed for nausea or vomiting. 10 tablet Windy Carina Gwenlyn Perking, MD      PDMP not reviewed this encounter.   Barrett Henle, MD 01/25/22 1307    Barrett Henle, MD 01/25/22 [phone]

## 2022-01-26 LAB — SARS CORONAVIRUS 2 (TAT 6-24 HRS): SARS Coronavirus 2: NEGATIVE

## 2022-02-09 ENCOUNTER — Ambulatory Visit
Admission: RE | Admit: 2022-02-09 | Discharge: 2022-02-09 | Disposition: A | Discharge status: Home / Self Care | Payer: No Typology Code available for payment source | Source: Ambulatory Visit | Attending: Emergency Medicine | Admitting: Emergency Medicine

## 2022-02-09 VITALS — BP 129/81 | HR 90 | Temp 97.6°F | Resp 18

## 2022-02-09 DIAGNOSIS — J029 Acute pharyngitis, unspecified: Secondary | ICD-10-CM

## 2022-02-09 DIAGNOSIS — B349 Viral infection, unspecified: Secondary | ICD-10-CM

## 2022-02-09 DIAGNOSIS — R110 Nausea: Secondary | ICD-10-CM

## 2022-02-09 DIAGNOSIS — H6693 Otitis media, unspecified, bilateral: Secondary | ICD-10-CM

## 2022-02-09 MED ORDER — CEFDINIR 300 MG PO CAPS: 300.0000 mg | ORAL_CAPSULE | Freq: Two times a day (BID) | ORAL | 0 refills | Status: AC

## 2022-02-09 MED ORDER — ONDANSETRON 4 MG PO TBDP: 4.0000 mg | ORAL_TABLET | Freq: Three times a day (TID) | ORAL | 0 refills | Status: AC | PRN

## 2022-02-09 NOTE — Discharge Instructions (Addendum)
Take the cefdinir as directed.    Take the antinausea medication as directed.    Keep yourself hydrated with clear liquids, such as water and Gatorade.  Follow the diarrhea diet as tolerated.   Follow up with your primary care provider.

## 2022-02-09 NOTE — ED Provider Notes (Signed)
Roderic Palau    CSN: [id number] Arrival date & time: 02/09/22  1326      History   Chief Complaint Chief Complaint  Patient presents with   Cough    Entered by patient    HPI Geofrey Silliman is a 57 y.o. male.  Patient presents with 2-3 week history of chills, body aches, sore throat, cough, nausea; worse x 1 day.  His diarrhea is at baseline.  He denies fever, rash, shortness of breath, vomiting, or other symptoms.  No OTC medications taken today.  Patient was seen at [facility] urgent care on 01/25/2022; diagnosed with viral URI with cough and dyspnea; CXR negative; treated with amoxicillin, benzonatate, ibuprofen, ondansetron.  His symptoms improved but never completely resolved and became worse yesterday.  His medical history includes diabetes,  hypertension, CAD, obesity, metabolic syndrome, GERD, kidney stones, sleep apnea, migraine headache, vertigo.   The history is provided by the patient and medical records.    Past Medical History:  Diagnosis Date   Coronary artery disease, non-occlusive 02/28/2008   Dr. Humphrey Rolls @ [facility]: 2 x 40-50% lesions in dominant LCx lesion by cath,  EF 60% with normal wall motion.;  Follow catheterization January 2021 showed stable findings.  LCx lesions likely not stenoses, more normal diameter vessel in setting of diffuse ectasia/aneurysm.   Diabetes mellitus without complication ([place])    Essential hypertension    GERD (gastroesophageal reflux disease)    Headache    High triglycerides     was previously on treatment, but  he notes that this was stopped   Kidney stones    Moderate obesity 05/29/2014   OSA treated with BiPAP    Short bowel syndrome 1994    Patient Active Problem List   Diagnosis Date Noted   Fatigue due to treatment 10/24/2021   Worsening headaches 04/30/2019   Neck pain 04/30/2019   Bilateral occipital neuralgia 04/30/2019   Migraine without aura and without status migrainosus, not intractable 04/30/2019    Vertigo 04/30/2019   Abnormal nuclear stress test 02/07/2019   Atypical angina 02/07/2019   Central adiposity 08/10/2018   DM (diabetes mellitus) ([place]) 07/08/2018   Medication management 01/01/2016   Hypercholesterolemia with hypertriglyceridemia 02/26/2015   Abnormally low high density lipoprotein (HDL) cholesterol with hypertriglyceridemia 02/26/2015   Atrial premature contractions 97/35/3299   Metabolic syndrome 24/26/8341   Rapid heart beat 02/26/2015   Signs and symptoms involving emotional state 05/29/2014   Anxiety 05/29/2014   Adult BMI 30+ 05/29/2014   Clinical depression 05/29/2014   Failure of erection 05/29/2014   Abnormal LFTs 05/29/2014   Blood glucose elevated 05/29/2014   GERD (gastroesophageal reflux disease) 05/29/2014   Combined fat and carbohydrate induced hyperlipemia 05/29/2014   Essential hypertension 05/29/2014   Headache, migraine 05/29/2014   Obesity, Class I, BMI 30.0-34.9 (see actual BMI) 05/29/2014   Gastroduodenal ulcer 05/29/2014   Apnea, sleep 96/22/2979   Umbilical hernia without obstruction and without gangrene 05/29/2014   Avitaminosis D 09/14/2009   Coronary artery disease, non-occlusive 02/27/2009    Past Surgical History:  Procedure Laterality Date   "stomach wrap"  1991   GERD   APPENDECTOMY     CARDIAC CATHETERIZATION  02/2008   Dr. Humphrey Rolls @ [facility]: 2 x 40-50% lesions in a dominant LCx by cath,  EF 60% with normal wall motion.   Cardiolite Nuclear Stress Test  01/2008    heart rate increased from 80-158 BPM. No EKG changes.  there is a moderate sized  reversible defect in the inferior and inferior lateral wall as well as anteroseptal. EF 59%.   CardioNet Holter Monitor  01/2008    sinus rhythm. Rare PVCs in setting was. Current PACs , seen in singles and pairs. Max heart rate 127, minimum heart rate 57. Average 84.   CHOLECYSTECTOMY     COLON SURGERY     due to gangrenous appendix   COLONOSCOPY WITH PROPOFOL N/A 04/26/2016   Procedure:  COLONOSCOPY WITH PROPOFOL;  Surgeon: Jonathon Bellows, MD;  Location: [facility] ENDOSCOPY;  Service: Endoscopy;  Laterality: N/A;   ESOPHAGOGASTRODUODENOSCOPY (EGD) WITH PROPOFOL N/A 01/11/2019   Procedure: ESOPHAGOGASTRODUODENOSCOPY (EGD) WITH PROPOFOL;  Surgeon: Jonathon Bellows, MD;  Location: [facility] ENDOSCOPY;  Service: Gastroenterology;  Laterality: N/A;   FLEXIBLE SIGMOIDOSCOPY N/A 01/11/2019   Procedure: FLEXIBLE SIGMOIDOSCOPY;  Surgeon: Jonathon Bellows, MD;  Location: [facility] ENDOSCOPY;  Service: Gastroenterology;  Laterality: N/A;   HERNIA REPAIR  2010   INNER EAR SURGERY     LEFT HEART CATH AND CORONARY ANGIOGRAPHY N/A 02/12/2019   Procedure: LEFT HEART CATH AND CORONARY ANGIOGRAPHY;  Surgeon: Leonie Man, MD;  Location: [facility];  Service: Cardiovascular; Proximal RCA 40%.  Diffuse aneurysmal/ectatic (patulous) dominant LCx with no significant lesions.  Questionable 40 to 50% stenosis-most likely actually just normal diameter with surrounding ectasia/aneurysmal segments.  EF 55 to 60%.  Normal EDP.   NM MYOVIEW LTD  01/10/2019   Treadmill Myoview: Walk 9:40 min-10.1 METS) EF ~45 to 50%.  INTERMEDIATE RISK STUDY-moderate sized mostly fixed perfusion defect in anterior apical segment.  No reversibility.  Suggests prior infarct without ischemia.   TONSILLECTOMY     TYMPANOSTOMY TUBE PLACEMENT Bilateral 1973       Home Medications    Prior to Admission medications   Medication Sig Start Date End Date Taking? Authorizing Provider  cefdinir (OMNICEF) 300 MG capsule Take 1 capsule (300 mg total) by mouth 2 (two) times daily for 10 days. 02/09/22 02/19/22 Yes Sharion Balloon, NP  ondansetron (ZOFRAN-ODT) 4 MG disintegrating tablet Take 1 tablet (4 mg total) by mouth every 8 (eight) hours as needed for nausea or vomiting. 02/09/22  Yes Sharion Balloon, NP  benzonatate (TESSALON) 100 MG capsule Take 1 capsule (100 mg total) by mouth 3 (three) times daily as needed for cough. 01/25/22   Barrett Henle, MD   BIOTIN PO Take 10,000 mcg by mouth 2 (two) times daily.    [provider]  bismuth subsalicylate (PEPTO BISMOL) 262 MG/15ML suspension Take 30 mLs by mouth every 6 (six) hours as needed for indigestion.    [provider]  butalbital-acetaminophen-caffeine (FIORICET) 50-325-40 MG tablet TAKE 1 TABLET BY MOUTH 2 (TWO) TIMES DAILY AS NEEDED FOR HEADACHE. 08/14/20   [name]., MD  Charcoal Activated (ACTIVATED CHARCOAL PO) Take 260 mg by mouth 2 (two) times daily.    [provider]  chlorthalidone (HYGROTON) 25 MG tablet Take 1 tablet (25 mg total) by mouth daily. 11/01/21   Leonie Man, MD  CINNAMON PO Take 1,200 mg by mouth daily.    [provider]  Continuous Blood Gluc Sensor (FREESTYLE LIBRE 3 SENSOR) MISC 1 each by Does not apply route daily. Place 1 sensor on the skin every 14 days. Use to check glucose continuously 03/22/21   [name]., MD  ibuprofen (ADVIL) 800 MG tablet Take 1 tablet (800 mg total) by mouth every 8 (eight) hours as needed (pain). 01/25/22   Juliane Poot  K, MD  losartan (COZAAR) 100 MG tablet TAKE 1 TABLET BY MOUTH EVERY DAY. TAKE WITH HYDROCHLOROTHIAZIDE 06/16/21   [name]., MD  Magnesium 100 MG CAPS Take 200 mg by mouth daily.    [provider]  Menthol, Topical Analgesic, (BIOFREEZE EX) Apply 1 application topically daily as needed (pain).    [provider]  metFORMIN (GLUCOPHAGE) 1000 MG tablet TAKE 1 TABLET (1,000 MG TOTAL) BY MOUTH 2 (TWO) TIMES DAILY WITH A MEAL. 03/11/21   [name]., MD  metoprolol succinate (TOPROL-XL) 50 MG 24 hr tablet Take 1 tablet (50 mg total) by mouth daily. Take with or immediately following a meal. 10/13/21   Leonie Man, MD  omeprazole (PRILOSEC OTC) 20 MG tablet Take 1 tablet (20 mg total) by mouth daily. Patient taking differently: Take 20 mg by mouth as needed. 05/14/15   [name]., MD  pioglitazone (ACTOS) 30  MG tablet TAKE 1 TABLET BY MOUTH EVERY DAY 09/24/21   [name]., MD  Potassium 99 MG TABS Take 99 mg by mouth daily.    [provider]  Probiotic Product (ALIGN) 4 MG CAPS Take 4 mg by mouth daily.    [provider]  rosuvastatin (CRESTOR) 20 MG tablet TAKE 1 TABLET BY MOUTH EVERY DAY 01/05/22   Leonie Man, MD  Saw Palmetto, Serenoa repens, (SAW PALMETTO PO) Take 1,400 mg by mouth daily.    [provider]  Simethicone 125 MG CAPS Take 125 mg by mouth daily as needed (gas).    [provider]  tadalafil (CIALIS) 20 MG tablet TAKE 1 TABLET (20 MG TOTAL) BY MOUTH EVERY THREE (3) DAYS AS NEEDED FOR ERECTILE DYSFUNCTION. 07/09/21   [name]., MD  TURMERIC PO Take 2,600 mg by mouth 2 (two) times daily.    [provider]  VASCEPA 1 g capsule TAKE 2 CAPSULES BY MOUTH 2 TIMES DAILY. 01/05/22   Leonie Man, MD  vitamin C (ASCORBIC ACID) 250 MG tablet Take 500 mg by mouth daily.    [provider]    Family History Family History  Problem Relation Age of Onset   Hypertension Mother    Irritable bowel syndrome Mother    Migraines Mother    Lumbar disc disease Mother    Heart attack Mother    Heart attack Maternal Uncle    Heart attack Maternal Grandfather     Social History Social History   Tobacco Use   Smoking status: Former    Packs/day: 1.00    Years: 28.00    Total pack years: 28.00    Types: Cigarettes    Quit date: 12/30/2005    Years since quitting: 16.1   Smokeless tobacco: Never  Vaping Use   Vaping Use: Never used  Substance Use Topics   Alcohol use: Yes    Comment: occ   Drug use: Not Currently     Allergies   Patient has no known allergies.   Review of Systems Review of Systems  Constitutional:  Positive for chills and fatigue. Negative for fever.  HENT:  Positive for ear pain and sore throat.   Respiratory:  Positive for cough. Negative for shortness of breath.    Cardiovascular:  Negative for chest pain and palpitations.  Gastrointestinal:  Positive for nausea. Negative for abdominal pain, diarrhea and vomiting.  Skin:  Negative for color change and rash.  All other systems reviewed and are  negative.    Physical Exam Triage Vital Signs ED Triage Vitals  Enc Vitals Group     BP      Pulse      Resp      Temp      Temp src      SpO2      Weight      Height      Head Circumference      Peak Flow      Pain Score      Pain Loc      Pain Edu?      Excl. in [place]?    No data found.  Updated Vital Signs BP 129/81   Pulse 90   Temp 97.6 F (36.4 C)   Resp 18   SpO2 98%   Visual Acuity Right Eye Distance:   Left Eye Distance:   Bilateral Distance:    Right Eye Near:   Left Eye Near:    Bilateral Near:     Physical Exam Vitals and nursing note reviewed.  Constitutional:      General: He is not in acute distress.    Appearance: Normal appearance. He is well-developed. He is not ill-appearing.  HENT:     Right Ear: Tympanic membrane is erythematous.     Left Ear: Tympanic membrane is erythematous.     Ears:     Comments: Bilateral TMs mildly erythematous.     Nose: Nose normal.     Mouth/Throat:     Mouth: Mucous membranes are moist.     Pharynx: Oropharynx is clear.  Cardiovascular:     Rate and Rhythm: Normal rate and regular rhythm.     Heart sounds: Normal heart sounds.  Pulmonary:     Effort: Pulmonary effort is normal. No respiratory distress.     Breath sounds: Normal breath sounds. No wheezing, rhonchi or rales.  Abdominal:     General: Bowel sounds are normal.     Palpations: Abdomen is soft.     Tenderness: There is no abdominal tenderness. There is no guarding or rebound.  Musculoskeletal:     Cervical back: Neck supple.  Skin:    General: Skin is warm and dry.  Neurological:     Mental Status: He is alert.  Psychiatric:        Mood and Affect: Mood normal.        Behavior: Behavior normal.       UC Treatments / Results  Labs (all labs ordered are listed, but only abnormal results are displayed) Labs Reviewed - No data to display  EKG   Radiology No results found.  Procedures Procedures (including critical care time)  Medications Ordered in UC Medications - No data to display  Initial Impression / Assessment and Plan / UC Course  I have reviewed the triage vital signs and the nursing notes.  Pertinent labs & imaging results that were available during my care of the patient were reviewed by me and considered in my medical decision making (see chart for details).    Bilateral otitis media, sore throat, nausea without vomiting, viral illness.  Afebrile, VSS.  Treating with cefdinir.  Treating nausea with Zofran.  Discussed clear liquid diet.  Instructed patient to advance diet as tolerated.  Education provided on otitis media and nausea.  Instructed patient to follow-up with his PCP.  He agrees to plan of care.    Final Clinical Impressions(s) / UC Diagnoses   Final diagnoses:  Bilateral otitis  media, unspecified otitis media type  Sore throat  Nausea without vomiting  Viral illness     Discharge Instructions      Take the cefdinir as directed.    Take the antinausea medication as directed.    Keep yourself hydrated with clear liquids, such as water and Gatorade.  Follow the diarrhea diet as tolerated.   Follow up with your primary care provider.         ED Prescriptions     Medication Sig Dispense Auth. Provider   cefdinir (OMNICEF) 300 MG capsule Take 1 capsule (300 mg total) by mouth 2 (two) times daily for 10 days. 20 capsule Sharion Balloon, NP   ondansetron (ZOFRAN-ODT) 4 MG disintegrating tablet Take 1 tablet (4 mg total) by mouth every 8 (eight) hours as needed for nausea or vomiting. 20 tablet Sharion Balloon, NP      PDMP not reviewed this encounter.   Sharion Balloon, NP 02/09/22 1404

## 2022-02-09 NOTE — ED Notes (Signed)
Provider triage  

## 2022-02-11 ENCOUNTER — Encounter: Payer: Self-pay | Admitting: Physician Assistant

## 2022-02-11 ENCOUNTER — Ambulatory Visit (INDEPENDENT_AMBULATORY_CARE_PROVIDER_SITE_OTHER): Payer: BLUE CROSS/BLUE SHIELD | Admitting: Physician Assistant

## 2022-02-11 VITALS — BP 135/85 | HR 72 | Temp 98.2°F | Ht 69.0 in | Wt 216.0 lb

## 2022-02-11 DIAGNOSIS — I152 Hypertension secondary to endocrine disorders: Secondary | ICD-10-CM | POA: Diagnosis not present

## 2022-02-11 DIAGNOSIS — E1159 Type 2 diabetes mellitus with other circulatory complications: Secondary | ICD-10-CM | POA: Diagnosis not present

## 2022-02-11 DIAGNOSIS — E1142 Type 2 diabetes mellitus with diabetic polyneuropathy: Secondary | ICD-10-CM | POA: Diagnosis not present

## 2022-02-11 LAB — POCT GLYCOSYLATED HEMOGLOBIN (HGB A1C)

## 2022-02-11 NOTE — Assessment & Plan Note (Addendum)
Follows with cardiology, slightly elevated in office Had recently stopped chlorthaidone and was just managed on toprol 50 mg, losartan 100 mg but recently added back chlorthaidone due to high pressures F/b cardiology

## 2022-02-11 NOTE — Assessment & Plan Note (Addendum)
Managed with metformin 1000 mg BID and actos 30 mg Last A1c 6.9% today, 7.4% As pt is sick, changing meds, not adhering to diet changes, will not change therapy for now f/u 4 mo On arb, statin Foot exam deferred  Uacr ordered today F/u 4 mo

## 2022-02-11 NOTE — Progress Notes (Signed)
Established patient visit   Patient: Robert Franklin   DOB: 1965-06-28   57 y.o. Male  MRN: [id number] Visit Date: 02/11/2022  Today's healthcare provider: Mikey Kirschner, PA-C   Cc. DM II f/u  Subjective    HPI  Diabetes Mellitus Type II, Follow-up  Lab Results  Component Value Date   HGBA1C 6.9 (H) 10/12/2021   HGBA1C 6.9 (A) 06/30/2021   HGBA1C 7.6 (H) 03/22/2021   Wt Readings from Last 3 Encounters:  02/11/22 216 lb (98 kg)  10/13/21 218 lb (98.9 kg)  10/12/21 218 lb (98.9 kg)   Management since then includes metformin 100 mg bid and actos 30 mg He reports excellent compliance with treatment. He is not having side effects.   Home blood sugar records: 200s-190s  Episodes of hypoglycemia? No    Current insulin regiment:none   Pertinent Labs: Lab Results  Component Value Date   CHOL 102 10/12/2021   HDL 27 (L) 10/12/2021   LDLCALC 42 10/12/2021   LDLDIRECT 46 07/05/2018   TRIG 206 (H) 10/12/2021   CHOLHDL 3.8 10/12/2021   Lab Results  Component Value Date   NA 141 10/12/2021   K 4.3 10/12/2021   CREATININE 0.96 10/12/2021   EGFR 93 10/12/2021     --------------------------------------------------------------------------------------------------- Hypertension, follow-up  BP Readings from Last 3 Encounters:  02/11/22 135/85  02/09/22 129/81  01/25/22 (!) 144/90   Wt Readings from Last 3 Encounters:  02/11/22 216 lb (98 kg)  10/13/21 218 lb (98.9 kg)  10/12/21 218 lb (98.9 kg)      He reports excellent compliance with treatment. He is having side effects -- higher blood sugar from chlorthalidone  Outside blood pressures are 135/85 Pertinent labs Lab Results  Component Value Date   CHOL 102 10/12/2021   HDL 27 (L) 10/12/2021   LDLCALC 42 10/12/2021   LDLDIRECT 46 07/05/2018   TRIG 206 (H) 10/12/2021   CHOLHDL 3.8 10/12/2021   Lab Results  Component Value Date   NA 141 10/12/2021   K 4.3 10/12/2021   CREATININE 0.96 10/12/2021    EGFR 93 10/12/2021   GLUCOSE 124 (H) 10/12/2021   TSH 1.080 10/12/2021     The ASCVD Risk score (Arnett DK, et al., 2019) failed to calculate for the following reasons:   The valid total cholesterol range is 130 to 320 mg/dL  ---------------------------------------------------------------------------------------------------  Medications: Outpatient Medications Prior to Visit  Medication Sig   benzonatate (TESSALON) 100 MG capsule Take 1 capsule (100 mg total) by mouth 3 (three) times daily as needed for cough.   BIOTIN PO Take 10,000 mcg by mouth 2 (two) times daily.   bismuth subsalicylate (PEPTO BISMOL) 262 MG/15ML suspension Take 30 mLs by mouth every 6 (six) hours as needed for indigestion.   butalbital-acetaminophen-caffeine (FIORICET) 50-325-40 MG tablet TAKE 1 TABLET BY MOUTH 2 (TWO) TIMES DAILY AS NEEDED FOR HEADACHE.   cefdinir (OMNICEF) 300 MG capsule Take 1 capsule (300 mg total) by mouth 2 (two) times daily for 10 days.   Charcoal Activated (ACTIVATED CHARCOAL PO) Take 260 mg by mouth 2 (two) times daily.   chlorthalidone (HYGROTON) 25 MG tablet Take 1 tablet (25 mg total) by mouth daily.   CINNAMON PO Take 1,200 mg by mouth daily.   Continuous Blood Gluc Sensor (FREESTYLE LIBRE 3 SENSOR) MISC 1 each by Does not apply route daily. Place 1 sensor on the skin every 14 days. Use to check glucose continuously   ibuprofen (ADVIL) 800 MG tablet  Take 1 tablet (800 mg total) by mouth every 8 (eight) hours as needed (pain).   losartan (COZAAR) 100 MG tablet TAKE 1 TABLET BY MOUTH EVERY DAY. TAKE WITH HYDROCHLOROTHIAZIDE   Magnesium 100 MG CAPS Take 200 mg by mouth daily.   Menthol, Topical Analgesic, (BIOFREEZE EX) Apply 1 application topically daily as needed (pain).   metFORMIN (GLUCOPHAGE) 1000 MG tablet TAKE 1 TABLET (1,000 MG TOTAL) BY MOUTH 2 (TWO) TIMES DAILY WITH A MEAL.   metoprolol succinate (TOPROL-XL) 50 MG 24 hr tablet Take 1 tablet (50 mg total) by mouth daily. Take  with or immediately following a meal.   omeprazole (PRILOSEC OTC) 20 MG tablet Take 1 tablet (20 mg total) by mouth daily. (Patient taking differently: Take 20 mg by mouth as needed.)   ondansetron (ZOFRAN-ODT) 4 MG disintegrating tablet Take 1 tablet (4 mg total) by mouth every 8 (eight) hours as needed for nausea or vomiting.   pioglitazone (ACTOS) 30 MG tablet TAKE 1 TABLET BY MOUTH EVERY DAY   Potassium 99 MG TABS Take 99 mg by mouth daily.   Probiotic Product (ALIGN) 4 MG CAPS Take 4 mg by mouth daily.   rosuvastatin (CRESTOR) 20 MG tablet TAKE 1 TABLET BY MOUTH EVERY DAY   Saw Palmetto, Serenoa repens, (SAW PALMETTO PO) Take 1,400 mg by mouth daily.   Simethicone 125 MG CAPS Take 125 mg by mouth daily as needed (gas).   tadalafil (CIALIS) 20 MG tablet TAKE 1 TABLET (20 MG TOTAL) BY MOUTH EVERY THREE (3) DAYS AS NEEDED FOR ERECTILE DYSFUNCTION.   TURMERIC PO Take 2,600 mg by mouth 2 (two) times daily.   VASCEPA 1 g capsule TAKE 2 CAPSULES BY MOUTH 2 TIMES DAILY.   vitamin C (ASCORBIC ACID) 250 MG tablet Take 500 mg by mouth daily.   No facility-administered medications prior to visit.    Review of Systems  Constitutional:  Negative for fatigue and fever.  Respiratory:  Negative for cough and shortness of breath.   Cardiovascular:  Negative for chest pain, palpitations and leg swelling.  Neurological:  Negative for dizziness and headaches.      Objective    Blood pressure 135/85, pulse 72, temperature 98.2 F (36.8 C), height '5\' 9"'$  (1.753 m), weight 216 lb (98 kg), SpO2 99 %.   Physical Exam Constitutional:      General: He is awake.     Appearance: He is well-developed.  HENT:     Head: Normocephalic.  Eyes:     Conjunctiva/sclera: Conjunctivae normal.  Cardiovascular:     Rate and Rhythm: Normal rate and regular rhythm.     Heart sounds: Normal heart sounds.  Pulmonary:     Effort: Pulmonary effort is normal.     Breath sounds: Normal breath sounds.  Skin:     General: Skin is warm.  Neurological:     Mental Status: He is alert and oriented to person, place, and time.  Psychiatric:        Attention and Perception: Attention normal.        Mood and Affect: Mood normal.        Speech: Speech normal.        Behavior: Behavior is cooperative.     Results for orders placed or performed in visit on 02/11/22  POCT glycosylated hemoglobin (Hb A1C)  Result Value Ref Range   Hemoglobin A1C     HbA1c POC (<> result, manual entry)     HbA1c, POC (prediabetic range)  HbA1c, POC (controlled diabetic range)      Assessment & Plan     Problem List Items Addressed This Visit       Cardiovascular and Mediastinum   Hypertension associated with diabetes ([place])    Follows with cardiology, slightly elevated in office Had recently stopped chlorthaidone and was just managed on toprol 50 mg, losartan 100 mg but recently added back chlorthaidone due to high pressures F/b cardiology          Endocrine   DM (diabetes mellitus) ([place]) - Primary    Managed with metformin 1000 mg BID and actos 30 mg Last A1c 6.9% today, 7.4% As pt is sick, changing meds, not adhering to diet changes, will not change therapy for now f/u 4 mo On arb, statin Foot exam deferred  Uacr ordered today F/u 4 mo        Relevant Orders   POCT glycosylated hemoglobin (Hb A1C) (Completed)   Urine Microalbumin w/creat. ratio    Return in about 4 months (around 06/12/2022) for DMII, hypertension.      I, Mikey Kirschner, PA-C have reviewed all documentation for this visit. The documentation on  02/11/22  for the exam, diagnosis, procedures, and orders are all accurate and complete.  Mikey Kirschner, PA-C [facility] [address] [place], [place], [address] Office: [phone] Fax: [place]

## 2022-02-15 LAB — MICROALBUMIN / CREATININE URINE RATIO
Creatinine, Urine: 183.2 mg/dL
Microalb/Creat Ratio: 19 mg/g creat (ref 0–29)
Microalbumin, Urine: 35.7 ug/mL

## 2022-04-15 ENCOUNTER — Other Ambulatory Visit: Payer: Self-pay | Admitting: Physician Assistant

## 2022-04-15 DIAGNOSIS — E119 Type 2 diabetes mellitus without complications: Secondary | ICD-10-CM

## 2022-05-04 ENCOUNTER — Other Ambulatory Visit: Payer: Self-pay

## 2022-05-04 ENCOUNTER — Ambulatory Visit
Admission: RE | Admit: 2022-05-04 | Discharge: 2022-05-04 | Disposition: A | Discharge status: Home / Self Care | Payer: BLUE CROSS/BLUE SHIELD | Source: Ambulatory Visit | Attending: Emergency Medicine | Admitting: Emergency Medicine

## 2022-05-04 VITALS — BP 142/77 | HR 75 | Temp 97.6°F | Resp 18

## 2022-05-04 DIAGNOSIS — J0100 Acute maxillary sinusitis, unspecified: Secondary | ICD-10-CM | POA: Diagnosis not present

## 2022-05-04 MED ORDER — AMOXICILLIN 875 MG PO TABS: 875.0000 mg | ORAL_TABLET | Freq: Two times a day (BID) | ORAL | 0 refills | Status: AC
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
## 2022-05-04 NOTE — Discharge Instructions (Addendum)
Take the amoxicillin as directed.  Follow up with your primary care provider tomorrow.  Go to the emergency department if you have worsening symptoms.
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
## 2022-05-23 ENCOUNTER — Telehealth: Payer: Self-pay | Admitting: Physician Assistant

## 2022-05-23 NOTE — Telephone Encounter (Signed)
[facility] requesting prescription refill losartan (COZAAR) 100 MG tablet   Please advise 

## 2022-05-24 ENCOUNTER — Other Ambulatory Visit: Payer: Self-pay

## 2022-05-24 MED ORDER — LOSARTAN POTASSIUM 100 MG PO TABS: ORAL_TABLET | ORAL | 3 refills | Status: DC

## 2022-06-14 ENCOUNTER — Other Ambulatory Visit: Payer: Self-pay | Admitting: Cardiology

## 2022-06-15 ENCOUNTER — Ambulatory Visit (INDEPENDENT_AMBULATORY_CARE_PROVIDER_SITE_OTHER): Payer: BC Managed Care – PPO | Admitting: Physician Assistant

## 2022-06-15 ENCOUNTER — Encounter: Payer: Self-pay | Admitting: Physician Assistant

## 2022-06-15 VITALS — BP 115/85 | HR 67 | Temp 98.4°F | Resp 13 | Ht 69.0 in | Wt 204.0 lb

## 2022-06-15 DIAGNOSIS — I152 Hypertension secondary to endocrine disorders: Secondary | ICD-10-CM

## 2022-06-15 DIAGNOSIS — K4090 Unilateral inguinal hernia, without obstruction or gangrene, not specified as recurrent: Secondary | ICD-10-CM

## 2022-06-15 DIAGNOSIS — E781 Pure hyperglyceridemia: Secondary | ICD-10-CM | POA: Diagnosis not present

## 2022-06-15 DIAGNOSIS — E1159 Type 2 diabetes mellitus with other circulatory complications: Secondary | ICD-10-CM | POA: Diagnosis not present

## 2022-06-15 DIAGNOSIS — E1142 Type 2 diabetes mellitus with diabetic polyneuropathy: Secondary | ICD-10-CM | POA: Diagnosis not present

## 2022-06-15 NOTE — Progress Notes (Unsigned)
I,Vanessa  Vital,acting as a Neurosurgeon for [employer], PA-C.,have documented all relevant documentation on the behalf of Alfredia Ferguson, PA-C,as directed by  Alfredia Ferguson, PA-C while in the presence of Alfredia Ferguson, PA-C.    Established patient visit   Patient: Robert Franklin   DOB: 04-08-1965   57 y.o. Male  MRN: [id number] Visit Date: 06/15/2022  Today's healthcare provider: Alfredia Ferguson, PA-C   Cc. DM, HTN f/u  Subjective    HPI Patient states that where he had hernia surgery has flared up and is starting to bother him.  L sided upper inguinal ? Feels bulge not on testicle but higher up feel bulge when he coughs   Diabetes Mellitus Type II, Follow-up 2/5 He stopped all his DM meds when he was in the [place] he reports drinking something and saw his sugar dropped.  Currently not on ANY DM meds.  Lab Results  Component Value Date   HGBA1C 6.9 (H) 10/12/2021   HGBA1C 6.9 (A) 06/30/2021   HGBA1C 7.6 (H) 03/22/2021   Wt Readings from Last 3 Encounters:  02/11/22 216 lb (98 kg)  10/13/21 218 lb (98.9 kg)  10/12/21 218 lb (98.9 kg)   Last seen for diabetes 4 months ago.  Management since then includes no changes. He reports good compliance with treatment. He is not having side effects.  Symptoms: No fatigue No foot ulcerations  No appetite changes No nausea  No paresthesia of the feet  No polydipsia  No polyuria No visual disturbances   No vomiting     Home blood sugar records:  150  Current exercise: none Current diet habits: well balanced  Pertinent Labs: Lab Results  Component Value Date   CHOL 102 10/12/2021   HDL 27 (L) 10/12/2021   LDLCALC 42 10/12/2021   LDLDIRECT 46 07/05/2018   TRIG 206 (H) 10/12/2021   CHOLHDL 3.8 10/12/2021   Lab Results  Component Value Date   NA 141 10/12/2021   K 4.3 10/12/2021   CREATININE 0.96 10/12/2021   EGFR 93 10/12/2021   LABMICR 35.7 02/14/2022   MICRALBCREAT 19 02/14/2022      ---------------------------------------------------------------------------------------------------   Medications: Outpatient Medications Prior to Visit  Medication Sig   benzonatate (TESSALON) 100 MG capsule Take 1 capsule (100 mg total) by mouth 3 (three) times daily as needed for cough.   BIOTIN PO Take 10,000 mcg by mouth 2 (two) times daily.   bismuth subsalicylate (PEPTO BISMOL) 262 MG/15ML suspension Take 30 mLs by mouth every 6 (six) hours as needed for indigestion.   butalbital-acetaminophen-caffeine (FIORICET) 50-325-40 MG tablet TAKE 1 TABLET BY MOUTH 2 (TWO) TIMES DAILY AS NEEDED FOR HEADACHE.   Charcoal Activated (ACTIVATED CHARCOAL PO) Take 260 mg by mouth 2 (two) times daily.   chlorthalidone (HYGROTON) 25 MG tablet TAKE 1 TABLET (25 MG TOTAL) BY MOUTH DAILY.   CINNAMON PO Take 1,200 mg by mouth daily.   Continuous Blood Gluc Sensor (FREESTYLE LIBRE 3 SENSOR) MISC 1 EACH BY DOES NOT APPLY ROUTE DAILY. PLACE 1 SENSOR ON THE SKIN EVERY 14 DAYS. USE TO CHECK GLUCOSE CONTINUOUSLY   ibuprofen (ADVIL) 800 MG tablet Take 1 tablet (800 mg total) by mouth every 8 (eight) hours as needed (pain).   losartan (COZAAR) 100 MG tablet TAKE 1 TABLET BY MOUTH EVERY DAY. TAKE WITH HYDROCHLOROTHIAZIDE   Magnesium 100 MG CAPS Take 200 mg by mouth daily.   Menthol, Topical Analgesic, (BIOFREEZE EX) Apply 1 application topically daily as needed (pain).  metFORMIN (GLUCOPHAGE) 1000 MG tablet TAKE 1 TABLET (1,000 MG TOTAL) BY MOUTH 2 (TWO) TIMES DAILY WITH A MEAL.   metoprolol succinate (TOPROL-XL) 50 MG 24 hr tablet Take 1 tablet (50 mg total) by mouth daily. Take with or immediately following a meal.   omeprazole (PRILOSEC OTC) 20 MG tablet Take 1 tablet (20 mg total) by mouth daily. (Patient taking differently: Take 20 mg by mouth as needed.)   ondansetron (ZOFRAN-ODT) 4 MG disintegrating tablet Take 1 tablet (4 mg total) by mouth every 8 (eight) hours as needed for nausea or vomiting.    pioglitazone (ACTOS) 30 MG tablet TAKE 1 TABLET BY MOUTH EVERY DAY   Potassium 99 MG TABS Take 99 mg by mouth daily.   Probiotic Product (ALIGN) 4 MG CAPS Take 4 mg by mouth daily.   rosuvastatin (CRESTOR) 20 MG tablet TAKE 1 TABLET BY MOUTH EVERY DAY   Saw Palmetto, Serenoa repens, (SAW PALMETTO PO) Take 1,400 mg by mouth daily.   Simethicone 125 MG CAPS Take 125 mg by mouth daily as needed (gas).   tadalafil (CIALIS) 20 MG tablet TAKE 1 TABLET (20 MG TOTAL) BY MOUTH EVERY THREE (3) DAYS AS NEEDED FOR ERECTILE DYSFUNCTION.   TURMERIC PO Take 2,600 mg by mouth 2 (two) times daily.   VASCEPA 1 g capsule TAKE 2 CAPSULES BY MOUTH 2 TIMES DAILY.   vitamin C (ASCORBIC ACID) 250 MG tablet Take 500 mg by mouth daily.   No facility-administered medications prior to visit.    Review of Systems     Objective    There were no vitals taken for this visit.   Physical Exam Constitutional:      General: He is awake.     Appearance: He is well-developed.  HENT:     Head: Normocephalic.  Eyes:     Conjunctiva/sclera: Conjunctivae normal.  Cardiovascular:     Rate and Rhythm: Normal rate and regular rhythm.     Heart sounds: Normal heart sounds.  Pulmonary:     Effort: Pulmonary effort is normal.     Breath sounds: Normal breath sounds.  Abdominal:     Comments: Upper inguinal vs lower abdomen  w/ area that bulges w/ cough no associated erythema  Skin:    General: Skin is warm.  Neurological:     Mental Status: He is alert and oriented to person, place, and time.  Psychiatric:        Attention and Perception: Attention normal.        Mood and Affect: Mood normal.        Speech: Speech normal.        Behavior: Behavior is cooperative.     ***  No results found for any visits on 06/15/22.  Assessment & Plan     ***  No follow-ups on file.     I, Alfredia Ferguson, PA-C have reviewed all documentation for this visit. The documentation on  06/15/22   for the exam, diagnosis,  procedures, and orders are all accurate and complete.  Alfredia Ferguson, PA-C [facility] [address] [place], [place], [address] Office: [phone] Fax: [phone]   [facility]

## 2022-06-16 ENCOUNTER — Encounter: Payer: Self-pay | Admitting: Physician Assistant

## 2022-06-16 LAB — COMPREHENSIVE METABOLIC PANEL
ALT: 26 IU/L (ref 0–44)
AST: 28 IU/L (ref 0–40)
Albumin/Globulin Ratio: 1.5 (ref 1.2–2.2)
Albumin: 4.7 g/dL (ref 3.8–4.9)
Alkaline Phosphatase: 76 IU/L (ref 44–121)
BUN/Creatinine Ratio: 28 — ABNORMAL HIGH (ref 9–20)
BUN: 31 mg/dL — ABNORMAL HIGH (ref 6–24)
Bilirubin Total: 0.9 mg/dL (ref 0.0–1.2)
CO2: 24 mmol/L (ref 20–29)
Calcium: 10.4 mg/dL — ABNORMAL HIGH (ref 8.7–10.2)
Chloride: 100 mmol/L (ref 96–106)
Creatinine, Ser: 1.1 mg/dL (ref 0.76–1.27)
Globulin, Total: 3.1 g/dL (ref 1.5–4.5)
Glucose: 140 mg/dL — ABNORMAL HIGH (ref 70–99)
Potassium: 4.7 mmol/L (ref 3.5–5.2)
Sodium: 141 mmol/L (ref 134–144)
Total Protein: 7.8 g/dL (ref 6.0–8.5)
eGFR: 79 mL/min/{1.73_m2} (ref >59)

## 2022-06-16 LAB — HEMOGLOBIN A1C
Est. average glucose Bld gHb Est-mCnc: 146 mg/dL
Hgb A1c MFr Bld: 6.7 % — ABNORMAL HIGH (ref 4.8–5.6)

## 2022-06-16 LAB — LIPID PANEL
Chol/HDL Ratio: 6.7 ratio — ABNORMAL HIGH (ref 0.0–5.0)
Cholesterol, Total: 202 mg/dL — ABNORMAL HIGH (ref 100–199)
HDL: 30 mg/dL — ABNORMAL LOW (ref >39)
LDL Chol Calc (NIH): 129 mg/dL — ABNORMAL HIGH (ref 0–99)
Triglycerides: 240 mg/dL — ABNORMAL HIGH (ref 0–149)
VLDL Cholesterol Cal: 43 mg/dL — ABNORMAL HIGH (ref 5–40)

## 2022-06-16 NOTE — Assessment & Plan Note (Signed)
Pt self d/c all meds. He has been tracking sugars, last month or so, < 200 av. 140s.  Advised pt we will see A1c and go from there, the best way to control/manage DM is diet/exercise, and as long as we are not increasing our risk of complications I am fine without meds He also d/c statin  On arb. Needs foot exam next visit.  Uacr utd F/u 4 mo

## 2022-06-16 NOTE — Assessment & Plan Note (Signed)
Well controlled.  Toprol 50, losartan 100  No chlorthaidone currently

## 2022-06-28 ENCOUNTER — Telehealth: Payer: Self-pay

## 2022-06-28 DIAGNOSIS — R0789 Other chest pain: Secondary | ICD-10-CM | POA: Diagnosis not present

## 2022-06-28 DIAGNOSIS — M79603 Pain in arm, unspecified: Secondary | ICD-10-CM | POA: Diagnosis not present

## 2022-06-28 DIAGNOSIS — R42 Dizziness and giddiness: Secondary | ICD-10-CM | POA: Diagnosis not present

## 2022-06-28 DIAGNOSIS — R519 Headache, unspecified: Secondary | ICD-10-CM | POA: Diagnosis not present

## 2022-06-28 DIAGNOSIS — R079 Chest pain, unspecified: Secondary | ICD-10-CM | POA: Diagnosis not present

## 2022-06-28 DIAGNOSIS — G4489 Other headache syndrome: Secondary | ICD-10-CM | POA: Diagnosis not present

## 2022-06-28 NOTE — Telephone Encounter (Unsigned)
Copied from CRM [id number]. Topic: General - Other >> Jun 28, 2022 12:44 PM Ja-Kwan M wrote: Reason for CRM: Dawn with [facility] ([facility]) stated that both pt insurances request pre-authorization for the CT that is scheduled for tomorrow. Cb# [phone] Ext. [phone]

## 2022-06-29 ENCOUNTER — Ambulatory Visit: Payer: No Typology Code available for payment source

## 2022-07-01 ENCOUNTER — Other Ambulatory Visit: Payer: Self-pay | Admitting: Cardiology

## 2022-07-06 NOTE — Telephone Encounter (Signed)
OK to fill.  Lana Flaim, MD  

## 2022-07-14 ENCOUNTER — Other Ambulatory Visit: Payer: Self-pay | Admitting: Physician Assistant

## 2022-07-14 DIAGNOSIS — K4090 Unilateral inguinal hernia, without obstruction or gangrene, not specified as recurrent: Secondary | ICD-10-CM

## 2022-07-15 ENCOUNTER — Telehealth: Payer: Self-pay

## 2022-07-15 NOTE — Telephone Encounter (Signed)
-----   Message from Alfredia Ferguson, PA-C sent at 07/14/2022  2:33 PM EDT ----- His insurance is requiring an ultrasound first. Ordered.  ----- Message ----- From: Unknown Foley Sent: 07/14/2022   1:57 PM EDT To: Alfredia Ferguson, PA-C  Please review incoming fax

## 2022-10-03 ENCOUNTER — Other Ambulatory Visit: Payer: Self-pay | Admitting: Cardiology

## 2022-12-06 ENCOUNTER — Telehealth: Payer: Self-pay | Admitting: Cardiology

## 2022-12-06 NOTE — Telephone Encounter (Signed)
Pt c/o BP issue: STAT if pt c/o blurred vision, one-sided weakness or slurred speech  1. What are your last 5 BP readings? 165/101 this morning 150/99, 158/110  2. Are you having any other symptoms (ex. Dizziness, headache, blurred vision, passed out)? Headaches, some dizziness  3. What is your BP issue? Blood pressure running high- patient wanted an appointment. I made him an appointment with Carlos Levering on Friday(12-09-22) in [place]- please call to evaluate

## 2022-12-06 NOTE — Telephone Encounter (Signed)
Patient identification verified by 2 forms. Marilynn Rail, RN   Called and spoke to patient  Patient states:   -BP this morning 165/101  -BP has been elevated all weekend   -BP right now 155/92  -has a constant headache, on going since this weekend    -Took tylenol at 11am, provided a little relief to headache but has returned   -"back of my neck is killing me"   -has a few days where he missed his medications   -has previously skipped medications but BP has never reacted like this   -took medications this morning  Patient denies:   -visual changes or disturbances   -chest pain/chest pressure  Advised patient:  -to keep 11/15 appointment for follow up -Take medications consistently & keep log  Reviewed strict ED warning signs/precautions  Patient verbalize understanding, no questions at this time

## 2022-12-06 NOTE — Telephone Encounter (Signed)
Patient returned RN's call. 

## 2022-12-06 NOTE — Telephone Encounter (Signed)
 Attempted to call patient, no answer left message requesting a call back.

## 2022-12-07 ENCOUNTER — Ambulatory Visit: Payer: No Typology Code available for payment source | Admitting: Cardiology

## 2022-12-09 ENCOUNTER — Encounter: Payer: Self-pay | Admitting: Student

## 2022-12-09 ENCOUNTER — Ambulatory Visit: Payer: No Typology Code available for payment source | Attending: Student | Admitting: Student

## 2022-12-09 VITALS — BP 142/90 | HR 68 | Ht 69.0 in | Wt 215.5 lb

## 2022-12-09 DIAGNOSIS — R072 Precordial pain: Secondary | ICD-10-CM | POA: Diagnosis not present

## 2022-12-09 DIAGNOSIS — E782 Mixed hyperlipidemia: Secondary | ICD-10-CM

## 2022-12-09 DIAGNOSIS — R519 Headache, unspecified: Secondary | ICD-10-CM

## 2022-12-09 DIAGNOSIS — I10 Essential (primary) hypertension: Secondary | ICD-10-CM | POA: Diagnosis not present

## 2022-12-09 DIAGNOSIS — I2510 Atherosclerotic heart disease of native coronary artery without angina pectoris: Secondary | ICD-10-CM

## 2022-12-09 DIAGNOSIS — E785 Hyperlipidemia, unspecified: Secondary | ICD-10-CM

## 2022-12-09 DIAGNOSIS — R002 Palpitations: Secondary | ICD-10-CM | POA: Diagnosis not present

## 2022-12-09 MED ORDER — IRBESARTAN 150 MG PO TABS: 150.0000 mg | ORAL_TABLET | Freq: Every day | ORAL | 0 refills | Status: DC

## 2022-12-09 MED ORDER — METOPROLOL TARTRATE 100 MG PO TABS: ORAL_TABLET | ORAL | 0 refills | Status: DC

## 2022-12-09 MED ORDER — HYDROCHLOROTHIAZIDE 25 MG PO TABS: 25.0000 mg | ORAL_TABLET | Freq: Every day | ORAL | 0 refills | Status: DC

## 2022-12-09 NOTE — Progress Notes (Signed)
Cardiology Clinic Note   Date: 12/09/2022 ID: Robert Franklin, DOB 31-Dec-1965, MRN [id number]  Primary Cardiologist:  Bryan Lemma, MD  Patient Profile    Robert Franklin is a 57 y.o. male who presents to the clinic today for evaluation of elevated BP.     Past medical history significant for: Nonobstructive CAD. LHC 02/12/2019 (abnormal stress test): Proximal RCA 40%.  Diffuse aneurysmal/ectatic LCx with no significant lesion (but was considered to be 40 to 50% lesions from previous catheterization probably just normal diameter with surrounding ectasia and aneurysmal segments).  Likely false positive stress test.  Recommend adding calcium channel blocker for distal vessel spasm. Hypertension. Hyperlipidemia. Lipid panel 06/15/2022: LDL 129, HDL 30, TG 240, total 202. Migraines. OSA. GERD. T2DM.  In summary, establish care with Dr. Herbie Baltimore on 02/25/2015.  Patient underwent LHC on 02/28/2008 for stable angina which showed 40% stenosis and 2 lesions of the mid LCx.  Stress test December 2020 was an intermediate risk study showing a moderate perfusion defect in the anterior apical/apical segments on rest and stress consistent with prior infarction and no evidence of reversible ischemia.  He underwent LHC and January 2021 as above.     History of Present Illness    Robert Franklin is followed by Dr. Herbie Baltimore for the above outlined history.  Patient was last starting on 10/13/2021 for routine follow-up.  Patient's BP was improved secondary to weight loss and chlorthalidone was stopped and Toprol reduced.  He was instructed to take a daily aspirin.  Patient contacted the office on 12/06/2022 with complaints of elevated BP.  Home readings 165/101, 150/99, 158/110.  He reported headaches and dizziness.  Per triage RN: Called and spoke to patient. Patient states:              -BP this morning 165/101             -BP has been elevated all weekend              -BP right now 155/92              -has a constant headache, on going since this weekend               -Took tylenol at 11am, provided a little relief to headache but has returned              -"back of my neck is killing me"              -has a few days where he missed his medications              -has previously skipped medications but BP has never reacted like this              -took medications this morning  Patient denies:              -visual changes or disturbances              -chest pain/chest pressure  Advised patient:  -to keep 11/15 appointment for follow up -Take medications consistently & keep log  Reviewed strict ED warning signs/precautions  Patient verbalize understanding, no questions at this time    Discussed the use of AI scribe software for clinical note transcription with the patient, who gave verbal consent to proceed.  The patient is accompanied by his wife. He presents with a chief complaint of high blood pressure, headaches, chest discomfort, and palpitations. The symptoms started about two  weeks ago, with the patient noting that his body has been changing for a while. The headaches, different from usual migraines, have been constant and unresponsive to migraine medication and minimal relief with Tylenol. Pain is in the occipital region. The patient also reports chest discomfort, described as a stabbing pain that can occur with minimal exertion such as walking, and can last for minutes. This chest discomfort can occur independently of his regular back pain and spasms. The patient also experiences palpitations, described as a pounding sensation in his chest, which can last from seconds to up to ten minutes. These palpitations can occur at any time, with or without exertion. The patient also reports mild shortness of breath and increased fatigue, especially after physical exertion at work. The patient reported he was "playing around" with medications. He stopped all his diabetes medications. He also  stopped rosuvastatin and chlorthalidone, as it was raising his blood sugar. With his BP being elevated he went back on chlorthalidone and has taken metoprolol and losartan consistently. BP has improved but still high. BP prior to the last 2 weeks typically ~120/80.         ROS: All other systems reviewed and are otherwise negative except as noted in History of Present Illness.  Studies Reviewed    EKG Interpretation Date/Time:  Friday December 09 2022 15:17:57 EST Ventricular Rate:  68 PR Interval:  150 QRS Duration:  90 QT Interval:  402 QTC Calculation: 427 R Axis:   -6  Text Interpretation: Normal sinus rhythm When compared with ECG of 01/28/2021 no significant change Confirmed by Carlos Levering [phone]) on 12/09/2022 3:24:13 PM    Risk Assessment/Calculations      HYPERTENSION CONTROL Vitals:   12/09/22 1500 12/09/22 1520 12/09/22 1749  BP: (!) 152/98 (!) 150/88 (!) 142/90    The patient's blood pressure is elevated above target today.  In order to address the patient's elevated BP: A new medication was prescribed today.           Physical Exam    VS:  BP (!) 150/88   Pulse 68   Ht 5\' 9"  (1.753 m)   Wt 215 lb 8 oz (97.8 kg)   SpO2 96%   BMI 31.82 kg/m  , BMI Body mass index is 31.82 kg/m.  GEN: Well nourished, well developed, in no acute distress. Neck: No JVD or carotid bruits. Cardiac:  RRR. No murmurs. No rubs or gallops.   Respiratory:  Respirations regular and unlabored. Clear to auscultation without rales, wheezing or rhonchi. GI: Soft, nontender, nondistended. Extremities: Radials/DP/PT 2+ and equal bilaterally. No clubbing or cyanosis. No edema.  Skin: Warm and dry, no rash. Neuro: Strength intact.  Assessment & Plan   Assessment and Plan    Hypertension BP today 150/88 on intake and 142/90 on my recheck. Recent increase in blood pressure with associated headaches and dizziness. Patient has been inconsistent with medication (Losartan 100mg ,  Metoprolol 50mg , Chlorthalidone 25mg ). -Change Losartan to irbesartan 150 mg daily. -Change chlorthalidone to hydrochlorothiazide 25 mg daily secondary to complaints of elevated BS on chlorthalidone.  -Continue Toprol.  -Check blood pressure 2 hours after taking medication daily. -BMP and CBC today.   Nonobstructive CAD/Chest Pain Nonobstructive CAD per angiography January 2021. Recent onset of episodic, stabbing chest pain associated with exertion and shortness of breath. History of false negative stress test. -Order coronary CTA. -Order echocardiogram to assess heart function and structure.  Palpitations Recent onset of palpitations lasting seconds to minutes, occurring at rest  and with exertion. -Defer workup until after addressing chest pain and hypertension.  Headaches New onset headaches, different from patient's usual migraines. Associated with high blood pressure. -Address as part of hypertension management. -Continue as needed tylenol.   Hyperlipidemia/hypertriglyceridemia Lipid panel May 2024 showed LDL 129, TG 240.  Patient has not been taking rosuvastatin or Vascepa. He recently restarted rosuvastatin.  -Lipid panel today (patient is fasting).          Disposition: CBC, BMP, lipid panel today. Coronary CTA. Echo. Stop Losartan and start Irbesartan 150 mg daily. Stop chlorthalidone and start hydrochlorothiazide 25 mg daily. Return in 4-6 weeks.            Signed, Etta Grandchild. Shariyah Eland, DNP, NP-C

## 2022-12-09 NOTE — Patient Instructions (Addendum)
Medication Instructions:  STOP the Losartan STOP the Chlorthalidone  START Irbesartan 150 mg once daily START Hydrochlorothiazide 25 mg once daily  *If you need a refill on your cardiac medications before your next appointment, please call your pharmacy*   Lab Work: Your provider would like for you to have following labs drawn today BMET, CBC and Lipid.   If you have labs (blood work) drawn today and your tests are completely normal, you will receive your results only by: MyChart Message (if you have MyChart) OR A paper copy in the mail If you have any lab test that is abnormal or we need to change your treatment, we will call you to review the results.   Testing/Procedures: Your physician has requested that you have an echocardiogram. Echocardiography is a painless test that uses sound waves to create images of your heart. It provides your doctor with information about the size and shape of your heart and how well your heart's chambers and valves are working.   You may receive an ultrasound enhancing agent through an IV if needed to better visualize your heart during the echo. This procedure takes approximately one hour.  There are no restrictions for this procedure.  This will take place at [address] [facility] [address] [facility], [place] [address]  Please note: We ask at that you not bring children with you during ultrasound (echo/ vascular) testing. Due to room size and safety concerns, children are not allowed in the ultrasound rooms during exams. Our front office staff cannot provide observation of children in our lobby area while testing is being conducted. An adult accompanying a patient to their appointment will only be allowed in the ultrasound room at the discretion of the ultrasound technician under special circumstances. We apologize for any inconvenience.   Follow-Up: At [facility], you and your health needs are our priority.  As part of our continuing  mission to provide you with exceptional heart care, we have created designated Provider Care Teams.  These Care Teams include your primary Cardiologist (physician) and Advanced Practice Providers (APPs -  Physician Assistants and Nurse Practitioners) who all work together to provide you with the care you need, when you need it.  We recommend signing up for the patient portal called "MyChart".  Sign up information is provided on this After Visit Summary.  MyChart is used to connect with patients for Virtual Visits (Telemedicine).  Patients are able to view lab/test results, encounter notes, upcoming appointments, etc.  Non-urgent messages can be sent to your provider as well.   To learn more about what you can do with MyChart, go to [email or web address].    Your next appointment:   2 month(s)  Provider:   You may see Bryan Lemma, MD or one of the following Advanced Practice Providers on your designated Care Team:   Carlos Levering, NP    Other Instructions   Your cardiac CT will be scheduled at one of the below locations:   [facility] [address] [place], [place] [address] [phone]  OR  [facility] [address] [place], [place] [address] [phone]  OR   [facility] [address] [place], [place] [address] [phone]  If scheduled at [facility], please arrive at the [facility] and Children's Entrance (Entrance C2) of [facility] 30 minutes prior to test start time. You can use the FREE valet parking offered at entrance C (encouraged  to control the heart rate for the test)  Proceed to the [facility] Radiology Department (first floor) to check-in and test prep.  All radiology patients and guests should use entrance C2 at [facility], accessed from [facility], even though the hospital's physical address listed is [address]    If scheduled at [facility] or [facility], please arrive 15 mins early for check-in and test prep.  There is spacious parking and easy access to the radiology department from the [facility] and Vascular entrance. Please enter here and check-in with the desk attendant.   Please follow these instructions carefully (unless otherwise directed):  An IV will be required for this test and Nitroglycerin will be given.  Hold all erectile dysfunction medications at least 3 days (72 hrs) prior to test. (Ie viagra, cialis, sildenafil, tadalafil, etc)   On the Night Before the Test: Be sure to Drink plenty of water. Do not consume any caffeinated/decaffeinated beverages or chocolate 12 hours prior to your test. Do not take any antihistamines 12 hours prior to your test.  On the Day of the Test: Drink plenty of water until 1 hour prior to the test. Do not eat any food 1 hour prior to test. You may take your regular medications prior to the test.  Take metoprolol (Lopressor) two hours prior to test. If you take Furosemide/Hydrochlorothiazide/Spironolactone, please HOLD on the morning of the test.        After the Test: Drink plenty of water. After receiving IV contrast, you may experience a mild flushed feeling. This is normal. On occasion, you may experience a mild rash up to 24 hours after the test. This is not dangerous. If this occurs, you can take Benadryl 25 mg and increase your fluid intake. If you experience trouble breathing, this can be serious. If it is severe call 911 IMMEDIATELY. If it is mild, please call our office. If you take any of these medications: Glipizide/Metformin, Avandament, Glucavance, please do not take 48 hours after completing test unless otherwise instructed.  We will call to schedule your test 2-4 weeks out understanding that some insurance companies will need an authorization prior to the service being  performed.   For more information and frequently asked questions, please visit our website : [email or web address]  For non-scheduling related questions, please contact the cardiac imaging nurse navigator should you have any questions/concerns: Cardiac Imaging Nurse Navigators Direct Office Dial: [phone]   For scheduling needs, including cancellations and rescheduling, please call [place], [phone].

## 2022-12-10 LAB — CBC
Hematocrit: 49.2 % (ref 37.5–51.0)
Hemoglobin: 16.9 g/dL (ref 13.0–17.7)
MCH: 30.4 pg (ref 26.6–33.0)
MCHC: 34.3 g/dL (ref 31.5–35.7)
MCV: 89 fL (ref 79–97)
Platelets: 182 10*3/uL (ref 150–450)
RBC: 5.56 x10E6/uL (ref 4.14–5.80)
RDW: 11.9 % (ref 11.6–15.4)
WBC: 7.8 10*3/uL (ref 3.4–10.8)

## 2022-12-10 LAB — LIPID PANEL
Chol/HDL Ratio: 6.3 ratio — ABNORMAL HIGH (ref 0.0–5.0)
Cholesterol, Total: 171 mg/dL (ref 100–199)
HDL: 27 mg/dL — ABNORMAL LOW (ref >39)
LDL Chol Calc (NIH): 76 mg/dL (ref 0–99)
Triglycerides: 429 mg/dL — ABNORMAL HIGH (ref 0–149)
VLDL Cholesterol Cal: 68 mg/dL — ABNORMAL HIGH (ref 5–40)

## 2022-12-10 LAB — BASIC METABOLIC PANEL
BUN/Creatinine Ratio: 18 (ref 9–20)
BUN: 21 mg/dL (ref 6–24)
CO2: 26 mmol/L (ref 20–29)
Calcium: 10 mg/dL (ref 8.7–10.2)
Chloride: 99 mmol/L (ref 96–106)
Creatinine, Ser: 1.16 mg/dL (ref 0.76–1.27)
Glucose: 153 mg/dL — ABNORMAL HIGH (ref 70–99)
Potassium: 4.5 mmol/L (ref 3.5–5.2)
Sodium: 139 mmol/L (ref 134–144)
eGFR: 74 mL/min/{1.73_m2} (ref >59)

## 2022-12-27 ENCOUNTER — Encounter (HOSPITAL_COMMUNITY): Payer: Self-pay

## 2022-12-29 ENCOUNTER — Ambulatory Visit: Admission: RE | Admit: 2022-12-29 | Payer: No Typology Code available for payment source | Source: Ambulatory Visit

## 2023-01-03 ENCOUNTER — Other Ambulatory Visit: Payer: Self-pay | Admitting: Cardiology

## 2023-01-03 ENCOUNTER — Ambulatory Visit: Payer: No Typology Code available for payment source

## 2023-01-31 ENCOUNTER — Encounter (HOSPITAL_COMMUNITY): Payer: Self-pay

## 2023-02-01 ENCOUNTER — Telehealth (HOSPITAL_COMMUNITY): Payer: Self-pay | Admitting: *Deleted

## 2023-02-01 NOTE — Telephone Encounter (Signed)
 Reaching out to patient to offer assistance regarding upcoming cardiac imaging study; pt verbalizes understanding of appt date/time, parking situation and where to check in, pre-test NPO status, and verified current allergies; name and call back number provided for further questions should they arise  Chantal Requena RN Navigator Cardiac Imaging [facility] [phone] office [phone] cell  Patient did not obtain is his 100mg  tablet. He will take an additional dose of his daily metoprolol  two hours prior to his cardiac CT scan.

## 2023-02-02 ENCOUNTER — Ambulatory Visit
Admission: RE | Admit: 2023-02-02 | Discharge: 2023-02-02 | Disposition: A | Discharge status: Home / Self Care | Payer: BC Managed Care – PPO | Source: Ambulatory Visit | Attending: Student | Admitting: Student

## 2023-02-02 DIAGNOSIS — R072 Precordial pain: Secondary | ICD-10-CM | POA: Insufficient documentation

## 2023-02-02 LAB — POCT I-STAT, CHEM 8
BUN: 27 mg/dL — ABNORMAL HIGH (ref 6–20)
Calcium, Ion: 1.25 mmol/L (ref 1.15–1.40)
Chloride: 104 mmol/L (ref 98–111)
Creatinine, Ser: 1.1 mg/dL (ref 0.61–1.24)
Glucose, Bld: 242 mg/dL — ABNORMAL HIGH (ref 70–99)
HCT: 44 % (ref 39.0–52.0)
Hemoglobin: 15 g/dL (ref 13.0–17.0)
Potassium: 4.5 mmol/L (ref 3.5–5.1)
Sodium: 141 mmol/L (ref 135–145)
TCO2: 26 mmol/L (ref 22–32)

## 2023-02-02 MED ORDER — IOHEXOL 350 MG/ML SOLN
100.0000 mL | Freq: Once | INTRAVENOUS | Status: AC | PRN
  Administered 2023-02-02: 100 mL via INTRAVENOUS

## 2023-02-02 MED ORDER — SODIUM CHLORIDE 0.9 % IV BOLUS
150.0000 mL | Freq: Once | INTRAVENOUS | Status: AC
  Administered 2023-02-02: 150 mL via INTRAVENOUS

## 2023-02-02 MED ORDER — METOPROLOL TARTRATE 5 MG/5ML IV SOLN
10.0000 mg | Freq: Once | INTRAVENOUS | Status: AC
  Administered 2023-02-02: 10 mg via INTRAVENOUS

## 2023-02-02 MED ORDER — METOPROLOL TARTRATE 5 MG/5ML IV SOLN
10.0000 mg | Freq: Once | INTRAVENOUS | Status: AC | PRN
  Administered 2023-02-02: 10 mg via INTRAVENOUS

## 2023-02-02 MED ORDER — NITROGLYCERIN 0.4 MG SL SUBL
0.8000 mg | SUBLINGUAL_TABLET | Freq: Once | SUBLINGUAL | Status: AC
  Administered 2023-02-02: 0.8 mg via SUBLINGUAL

## 2023-02-02 MED ORDER — DILTIAZEM HCL 25 MG/5ML IV SOLN: 10.0000 mg | INTRAVENOUS | Status: DC | PRN

## 2023-02-02 NOTE — Progress Notes (Signed)
 Patient tolerated procedure well. Ambulate w/o difficulty. Denies light headedness or being dizzy. Sitting up drinking water provided. Encouraged to drink extra water today and reasoning explained. Verbalized understanding. All questions answered. ABC intact. No further needs. Discharge from procedure area w/o issues.

## 2023-02-06 ENCOUNTER — Ambulatory Visit: Payer: BC Managed Care – PPO | Attending: Student

## 2023-02-06 DIAGNOSIS — R072 Precordial pain: Secondary | ICD-10-CM

## 2023-02-06 DIAGNOSIS — I2510 Atherosclerotic heart disease of native coronary artery without angina pectoris: Secondary | ICD-10-CM

## 2023-02-06 LAB — ECHOCARDIOGRAM COMPLETE
AV Mean grad: 2 mm[Hg]
AV Peak grad: 4 mm[Hg]
Ao pk vel: 1 m/s
Area-P 1/2: 3.99 cm2
Calc EF: 45.3 %
S' Lateral: 4.6 cm
Single Plane A2C EF: 38.9 %
Single Plane A4C EF: 47 %

## 2023-02-10 ENCOUNTER — Ambulatory Visit: Payer: No Typology Code available for payment source | Admitting: Student

## 2023-02-10 NOTE — Progress Notes (Unsigned)
Cardiology Clinic Note   Date: 02/13/2023 ID: Robert Franklin, Robert Franklin December 08, 1965, MRN [id number]  Primary Cardiologist:  Bryan Lemma, MD  Patient Profile    Robert Franklin is a 58 y.o. male who presents to the clinic today for follow up after testing.     Past medical history significant for: Nonobstructive CAD. LHC 02/12/2019 (abnormal stress test): Proximal RCA 40%.  Diffuse aneurysmal/ectatic LCx with no significant lesion (but was considered to be 40 to 50% lesions from previous catheterization probably just normal diameter with surrounding ectasia and aneurysmal segments).  Likely false positive stress test.  Recommend adding calcium channel blocker for distal vessel spasm. Coronary CTA 02/02/2023: Coronary calcium score 1153 (99th percentile). Moderate proximal RCA stenosis, ectatic proximal LCx, minimal mid LCx, mild proximal LAD.  Chronic HFmrEF.  Echo 02/06/2023: EF 40-45%. Global hypokinesis. Normal diastolic parameters. Normal global strain. Normal RV size/function. Mild MR. Trivial AI.  Hypertension. Hyperlipidemia. Lipid panel 12/09/2022: LDL 76, HDL 27, TG 429, total 171. Migraines. OSA. GERD. T2DM.  In summary, establish care with Dr. Herbie Baltimore on 02/25/2015.  Patient underwent LHC on 02/28/2008 for stable angina which showed 40% stenosis and 2 lesions of the mid LCx.  Stress test December 2020 was an intermediate risk study showing a moderate perfusion defect in the anterior apical/apical segments on rest and stress consistent with prior infarction and no evidence of reversible ischemia.  He underwent LHC and January 2021 as above. Upon follow up in September 2023 patient's BP was improved secondary to weight loss. Chlorthalidone was stopped and Toprol was reduced.      History of Present Illness    Robert Franklin is followed by Dr. Herbie Baltimore for the above outlined history.   Patient was last seen in the office by me on 12/09/2022 for evaluation of elevated BP.  Patient reported BP elevated as high as 165/101. He reported missing a  few days of his medications. At the time of his visit he reported continued high BP, headaches, chest discomfort and palpitations x 2 weeks. Headaches were described as different from usual migraines, constant in occipital region, unresponsive to migraine medications and minimally responsive to tylenol. He described stabbing chest pain occurring with minimal exertion and lasting minutes. He reports shortness of breath and increased fatigue particularly after performing physical exertion at work. Patient reported stopping diabetic medications. He stopped rosuvastatin and chlorthalidone, as it was raising his blood sugar. When his BP increased he restarted chlorthalidone and has taken metoprolol and losartan consistently. The prior 2 weeks his BP was ~120/80. Patient wanted to stop chlorthalidone secondary to raising his blood sugar. Losartan changed to irbesartan, and chlorthalidone changed to hydrochlorothiazide. Coronary CTA showed calcium score of 1153 with mild to moderate CAD as detailed above. Echo showed moderately decreased LV function with normal RV function, global hypokinesis, normal diastolic parameters, and mild MR.   Today, patient is accompanied by his wife. He reports continued episodes of chest pain but feels this could be coming from his chronic back pain. He reports chest symptoms improve with heat to his back. He continues to have DOE and fatigue and a feeling that "things are not right." BP improved on irbesartan. He could not take hydrochlorothiazide as it raised his blood sugar. He is not taking any diabetes medications. He denies lower extremity edema. He does occasionally feel abdominal bloating. He has noticed his weight trending up. He weighs sporadically at home. He believes his normal weight is 215 lb. He is having more  frequent headaches. He is followed by a physician in [place] and is encouraged to set up  follow up. Discussed echo and coronary CTA in detail. All questions answered.      ROS: All other systems reviewed and are otherwise negative except as noted in History of Present Illness.  EKGs/Labs Reviewed        06/15/2022: ALT 26; AST 28 02/02/2023: BUN 27; Creatinine, Ser 1.10; Potassium 4.5; Sodium 141   12/09/2022: WBC 7.8 02/02/2023: Hemoglobin 15.0       Physical Exam    VS:  BP (!) 124/90 (BP Location: Left Arm, Patient Position: Sitting, Cuff Size: Large)   Pulse 75   Ht 5\' 9"  (1.753 m)   Wt 223 lb (101.2 kg)   SpO2 98%   BMI 32.93 kg/m  , BMI Body mass index is 32.93 kg/m.  GEN: Well nourished, well developed, in no acute distress. Neck: No JVD or carotid bruits. Cardiac:  RRR. No murmurs. No rubs or gallops.   Respiratory:  Respirations regular and unlabored. Clear to auscultation without rales, wheezing or rhonchi. GI: Soft, nontender, nondistended. Extremities: Radials/DP/PT 2+ and equal bilaterally. No clubbing or cyanosis. No edema.  Skin: Warm and dry, no rash. Neuro: Strength intact.  Assessment & Plan   Hypertension BP today 124/90.  Patient previously inconsistent with medication (Losartan 100mg , Metoprolol 50mg , Chlorthalidone 25mg ). He has been taking Toprol and irbesartan consistently since his last visit and notes improved BP at home. He did have a high reading last night at 148/90 but otherwise overall well controlled.  -Continue Toprol, irbesartan.    Nonobstructive CAD/Chest Pain Nonobstructive CAD per angiography January 2021. History of false negative stress test. Coronary CTA January 2025 with calcium score of 1153 (99th percentile). Moderate proximal RCA stenosis, ectatic proximal LCx, minimal mid LCx, mild proximal LAD. Patient reports continued chest pain but feels it could be coming from his chronic back pain. He has noted improved in chest symptoms with heat to his back. He is active with his job.  -Continue Toprol and rosuvastatin as  below.   Chronic HFmrEF Echo January 2025 showed EF 40-45%, global hypokinesis, normal diastolic parameters, normal strain, mild MR. Patient denies lower extremity edema but has noted occasional abdominal bloating/fullness. He does experience DOE while at work. His weight has been trending up. He weighs sporadically at home. Normal weight is 215 lb. Euvolemic and well compensated on exam.  -Continue irbesartan.  -Weigh daily.  -Add Lasix 20 mg daily as needed for weight gain of 3 lb overnight or 5 lb in a week.  -Add [place].  -BMP in 3 weeks.  -Repeat echo end of April 2025.    Hyperlipidemia/hypertriglyceridemia Lipid panel November 2024 showed LDL 76, TG 429.  Patient has not been taking rosuvastatin or Vascepa. He recently restarted rosuvastatin but stopped it again, as his blood sugar was elevated. He is not taking any medications for diabetes. Patient agrees to try a lower dose of rosuvastatin to see if it has less of an effect on his blood sugar.  -Rosuvastatin 5 mg daily.  -Restart Vascepa.  -Recheck lipids at follow up.   Disposition: Rosuvastatin 5 mg daily, Lasix 20 mg daily as needed for weight gain, Farxiga 10 mg daily. Weigh daily. BMP in 3 weeks. Repeat echo end of April 2025. Return 3-4 months after echo.          Signed, Etta Grandchild. Landree Fernholz, DNP, NP-C

## 2023-02-13 ENCOUNTER — Ambulatory Visit: Payer: BC Managed Care – PPO | Attending: Student | Admitting: Student

## 2023-02-13 ENCOUNTER — Other Ambulatory Visit: Payer: Self-pay | Admitting: Cardiology

## 2023-02-13 ENCOUNTER — Encounter: Payer: Self-pay | Admitting: Student

## 2023-02-13 VITALS — BP 124/90 | HR 75 | Ht 69.0 in | Wt 223.0 lb

## 2023-02-13 DIAGNOSIS — I2510 Atherosclerotic heart disease of native coronary artery without angina pectoris: Secondary | ICD-10-CM

## 2023-02-13 DIAGNOSIS — E781 Pure hyperglyceridemia: Secondary | ICD-10-CM

## 2023-02-13 DIAGNOSIS — I5022 Chronic systolic (congestive) heart failure: Secondary | ICD-10-CM | POA: Diagnosis not present

## 2023-02-13 DIAGNOSIS — E785 Hyperlipidemia, unspecified: Secondary | ICD-10-CM

## 2023-02-13 DIAGNOSIS — I10 Essential (primary) hypertension: Secondary | ICD-10-CM

## 2023-02-13 DIAGNOSIS — Z79899 Other long term (current) drug therapy: Secondary | ICD-10-CM | POA: Diagnosis not present

## 2023-02-13 MED ORDER — ROSUVASTATIN CALCIUM 5 MG PO TABS: 5.0000 mg | ORAL_TABLET | Freq: Every day | ORAL | 3 refills | Status: DC

## 2023-02-13 MED ORDER — DAPAGLIFLOZIN PROPANEDIOL 10 MG PO TABS: 10.0000 mg | ORAL_TABLET | Freq: Every day | ORAL | 3 refills | Status: DC

## 2023-02-13 MED ORDER — FUROSEMIDE 20 MG PO TABS: 20.0000 mg | ORAL_TABLET | Freq: Every day | ORAL | 3 refills | Status: DC | PRN

## 2023-02-13 NOTE — Patient Instructions (Signed)
Medication Instructions:  Your physician has recommended you make the following change in your medication:    Start taking  Farxiga 10 mg by mouth daily Rosuvastatin 5 mg by mouth daily Lasix 20 mg by mouth daily AS NEEDED for weight gain of 3 pounds overnight or 5 pounds in a week *If you need a refill on your cardiac medications before your next appointment, please call your pharmacy*   Lab Work: Your provider would like for you to return in 3 weeks  to have the following labs drawn: BMP.   Please go to [facility] [address] ([facility]) [address], [place] [address] You do not need an appointment.  They are open from 8 am- 4:30 pm.  Lunch from 1:00 pm- 2:00 pm You will  not need to be fasting.   You may also go to one of the following [facility]:  [address] [place], [place] [address] Phone: [phone] Lab hours: Mon-Fri 8 am- 5 pm    Lunch 12 pm- 1 pm  [address] [place],  [place]  [address]  [place] Phone: [phone] Lab hours: 7 am- 4 pm Lunch 12 pm-1 pm   [address] [place],  [place]  [address]  [place] Phone: [phone] Lab hours: Mon-Fri 8 am- 5 pm    Lunch 12 pm- 1 pm  If you have labs (blood work) drawn today and your tests are completely normal, you will receive your results only by: MyChart Message (if you have MyChart) OR A paper copy in the mail If you have any lab test that is abnormal or we need to change your treatment, we will call you to review the results.   Testing/Procedures: Your physician has requested that you have an echocardiogram.In April 2025  Echocardiography is a painless test that uses sound waves to create images of your heart. It provides your doctor with information about the size and shape of your heart and how well your heart's chambers and valves are working.   You may receive an ultrasound enhancing agent through an IV if needed to better visualize your heart during the echo. This procedure takes  approximately one hour.  There are no restrictions for this procedure.  This will take place at [address] [facility]) [address], [place] [address]  Please note: We ask at that you not bring children with you during ultrasound (echo/ vascular) testing. Due to room size and safety concerns, children are not allowed in the ultrasound rooms during exams. Our front office staff cannot provide observation of children in our lobby area while testing is being conducted. An adult accompanying a patient to their appointment will only be allowed in the ultrasound room at the discretion of the ultrasound technician under special circumstances. We apologize for any inconvenience.    Follow-Up: At [facility], you and your health needs are our priority.  As part of our continuing mission to provide you with exceptional heart care, we have created designated Provider Care Teams.  These Care Teams include your primary Cardiologist (physician) and Advanced Practice Providers (APPs -  Physician Assistants and Nurse Practitioners) who all work together to provide you with the care you need, when you need it.  We recommend signing up for the patient portal called "MyChart".  Sign up information is provided on this After Visit Summary.  MyChart is used to connect with patients for Virtual Visits (Telemedicine).  Patients are able to view lab/test results, encounter notes, upcoming  appointments, etc.  Non-urgent messages can be sent to your provider as well.   To learn more about what you can do with MyChart, go to [email or web address].    Your next appointment:   4 month(s)  Provider:   Carlos Levering, NP    Other Instructions  Carlos Levering recommends to weigh daily.

## 2023-02-24 ENCOUNTER — Telehealth: Payer: Self-pay

## 2023-02-24 ENCOUNTER — Other Ambulatory Visit (HOSPITAL_COMMUNITY): Payer: Self-pay

## 2023-02-24 NOTE — Telephone Encounter (Signed)
Pharmacy Patient Advocate Encounter   Received notification from CoverMyMeds that prior authorization for VASCEPA is required/requested.   Insurance verification completed.   The patient is insured through [facility] .   Per test claim: PA required; PA submitted to above mentioned insurance via CoverMyMeds Key/confirmation #/EOC [id number] Status is pending

## 2023-02-26 ENCOUNTER — Other Ambulatory Visit: Payer: Self-pay | Admitting: Student

## 2023-02-28 ENCOUNTER — Other Ambulatory Visit (HOSPITAL_COMMUNITY): Payer: Self-pay

## 2023-02-28 NOTE — Telephone Encounter (Signed)
 Pharmacy Patient Advocate Encounter  Received notification from [facility] that Prior Authorization for VASCEPA  has been APPROVED from 02/24/23 to 02/24/24. Ran test claim, Copay is $15. This test claim was processed through [facility]- copay amounts may vary at other pharmacies due to pharmacy/plan contracts, or as the patient moves through the different stages of their insurance plan.

## 2023-03-08 ENCOUNTER — Other Ambulatory Visit: Payer: Self-pay | Admitting: Student

## 2023-04-17 DIAGNOSIS — H6983 Other specified disorders of Eustachian tube, bilateral: Secondary | ICD-10-CM | POA: Diagnosis not present

## 2023-04-17 DIAGNOSIS — J069 Acute upper respiratory infection, unspecified: Secondary | ICD-10-CM | POA: Diagnosis not present

## 2023-04-17 DIAGNOSIS — R051 Acute cough: Secondary | ICD-10-CM | POA: Diagnosis not present

## 2023-04-17 DIAGNOSIS — J3489 Other specified disorders of nose and nasal sinuses: Secondary | ICD-10-CM | POA: Diagnosis not present

## 2023-04-17 DIAGNOSIS — H90A31 Mixed conductive and sensorineural hearing loss, unilateral, right ear with restricted hearing on the contralateral side: Secondary | ICD-10-CM | POA: Diagnosis not present

## 2023-04-17 DIAGNOSIS — R0981 Nasal congestion: Secondary | ICD-10-CM | POA: Diagnosis not present

## 2023-04-17 DIAGNOSIS — J019 Acute sinusitis, unspecified: Secondary | ICD-10-CM | POA: Diagnosis not present

## 2023-04-17 DIAGNOSIS — J310 Chronic rhinitis: Secondary | ICD-10-CM | POA: Diagnosis not present

## 2023-04-30 ENCOUNTER — Other Ambulatory Visit: Payer: Self-pay | Admitting: Student

## 2023-05-08 ENCOUNTER — Ambulatory Visit: Payer: BC Managed Care – PPO | Attending: Student

## 2023-05-08 DIAGNOSIS — I5022 Chronic systolic (congestive) heart failure: Secondary | ICD-10-CM

## 2023-05-08 DIAGNOSIS — Z79899 Other long term (current) drug therapy: Secondary | ICD-10-CM | POA: Diagnosis not present

## 2023-05-09 LAB — ECHOCARDIOGRAM LIMITED
AR max vel: 3.21 cm2
AV Area VTI: 3.28 cm2
AV Area mean vel: 3.16 cm2
AV Mean grad: 2 mmHg
AV Peak grad: 3.8 mmHg
Ao pk vel: 0.98 m/s
Area-P 1/2: 4.15 cm2
S' Lateral: 4.1 cm

## 2023-05-09 LAB — BASIC METABOLIC PANEL WITH GFR
BUN/Creatinine Ratio: 19 (ref 9–20)
BUN: 21 mg/dL (ref 6–24)
CO2: 24 mmol/L (ref 20–29)
Calcium: 9.6 mg/dL (ref 8.7–10.2)
Chloride: 103 mmol/L (ref 96–106)
Creatinine, Ser: 1.08 mg/dL (ref 0.76–1.27)
Glucose: 216 mg/dL — ABNORMAL HIGH (ref 70–99)
Potassium: 4.6 mmol/L (ref 3.5–5.2)
Sodium: 142 mmol/L (ref 134–144)
eGFR: 81 mL/min/{1.73_m2} (ref >59)

## 2023-06-07 NOTE — Progress Notes (Signed)
 Cardiology Clinic Note   Date: 06/12/2023 ID: Robert Franklin, DOB 1965/08/06, MRN [id number]  Primary Cardiologist:  Randene Bustard, MD  Chief Complaint   Robert Franklin is a 58 y.o. male who presents to the clinic today for follow up after testing.   Patient Profile   Robert Franklin is followed by Dr. Addie Holstein for the history outlined below.      Past medical history significant for: Nonobstructive CAD. LHC 02/12/2019 (abnormal stress test): Proximal RCA 40%.  Diffuse aneurysmal/ectatic LCx with no significant lesion (but was considered to be 40 to 50% lesions from previous catheterization probably just normal diameter with surrounding ectasia and aneurysmal segments).  Likely false positive stress test.  Recommend adding calcium  channel blocker for distal vessel spasm. Coronary CTA 02/02/2023: Coronary calcium  score 1153 (99th percentile). Moderate proximal RCA stenosis, ectatic proximal LCx, minimal mid LCx, mild proximal LAD.  Chronic HFmrEF with improved LV function.  Limited echo 05/08/2023: EF 50 to 55%.  No RWMA.  Normal global strain.  Normal RV size/function.  No significant valvular abnormalities. Hypertension. Hyperlipidemia. Lipid panel 12/09/2022: LDL 76, HDL 27, TG 429, total 171. Migraines. OSA. GERD. T2DM.  In summary, establish care with Dr. Addie Holstein on 02/25/2015.  Patient underwent LHC on 02/28/2008 for stable angina which showed 40% stenosis and 2 lesions of the mid LCx.  Stress test December 2020 was an intermediate risk study showing a moderate perfusion defect in the anterior apical/apical segments on rest and stress consistent with prior infarction and no evidence of reversible ischemia.  He underwent LHC and January 2021 as above. Upon follow up in September 2023 patient's BP was improved secondary to weight loss. Chlorthalidone  was stopped and Toprol  was reduced.    Patient was seen in the office on 12/09/2022 for evaluation of elevated BP. Patient  reported BP elevated as high as 165/101. He reported missing a few days of his medications. At the time of his visit he reported continued high BP, headaches, chest discomfort and palpitations x 2 weeks. Headaches were described as different from usual migraines, constant in occipital region, unresponsive to migraine medications and minimally responsive to tylenol . He described stabbing chest pain occurring with minimal exertion and lasting minutes. He reports shortness of breath and increased fatigue particularly after performing physical exertion at work. Patient reported stopping diabetic medications. He stopped rosuvastatin  and chlorthalidone , as it was raising his blood sugar. When his BP increased he restarted chlorthalidone  and has taken metoprolol  and losartan  consistently. The prior 2 weeks his BP was ~120/80. Patient wanted to stop chlorthalidone  secondary to raising his blood sugar. Losartan  changed to irbesartan , and chlorthalidone  changed to hydrochlorothiazide . Coronary CTA showed calcium  score of 1153 with mild to moderate CAD as detailed above. Echo showed moderately decreased LV function with normal RV function, global hypokinesis, normal diastolic parameters, and mild MR.   Patient was last seen in the office by me on 02/13/2023 for follow-up after testing.  He reported continued episodes of chest pain that he felt was coming from chronic back pain.  Chest symptoms improved with heat on his back.  He continued with DOE and fatigue.  He was unable to take HCTZ secondary to an increase in his blood sugar.  He reported more frequent headaches and was encouraged to follow-up with his physician in [place].  He reported uptrending weight.  As needed Lasix  was added for weight gain as well as Farxiga .  Patient agreed to trying low-dose rosuvastatin  and restarting Vascepa .  Repeat  echo April 2025 demonstrated improved LV function as detailed above.     History of Present Illness    Today, patient  is accompanied by his wife. He is doing well. He reports stopping Crestor , as it increased his blood sugar significantly. He continues to work on his diet to get his sugar under control. He has lost 14 lb since his last visit. He reports improved energy. Patient denies shortness of breath, dyspnea on exertion, lower extremity edema, orthopnea or PND. No chest pain, pressure, or tightness. No palpitations. Echo discussed in detail and all questions answered.     ROS: All other systems reviewed and are otherwise negative except as noted in History of Present Illness.  EKGs/Labs Reviewed        06/15/2022: ALT 26; AST 28 05/08/2023: BUN 21; Creatinine, Ser 1.08; Potassium 4.6; Sodium 142   12/09/2022: WBC 7.8 02/02/2023: Hemoglobin 15.0   Physical Exam    VS:  BP 130/82   Pulse 81   Ht 5\' 9"  (1.753 m)   Wt 209 lb (94.8 kg)   SpO2 98%   BMI 30.86 kg/m  , BMI Body mass index is 30.86 kg/m.  GEN: Well nourished, well developed, in no acute distress. Neck: No JVD or carotid bruits. Cardiac:  RRR. No murmurs. No rubs or gallops.   Respiratory:  Respirations regular and unlabored. Clear to auscultation without rales, wheezing or rhonchi. GI: Soft, nontender, nondistended. Extremities: Radials/DP/PT 2+ and equal bilaterally. No clubbing or cyanosis. No edema.  Skin: Warm and dry, no rash. Neuro: Strength intact.  Assessment & Plan   Hypertension BP today 130/82.  Patient reports he is actively working on improving his diet. He is down 14 lb since his last visit. He feels his energy has improved.  -Continue Toprol , irbesartan .    Nonobstructive CAD/Chest Pain Nonobstructive CAD per angiography January 2021. History of false negative stress test. Coronary CTA January 2025 with calcium  score of 1153 (99th percentile). Moderate proximal RCA stenosis, ectatic proximal LCx, minimal mid LCx, mild proximal LAD. Patient denies chest pain, pressure or tightness. He is active at work.  -Continue  Toprol .    Chronic HFmrEF with improved LV function Echo April 2025 showed EF 50 to 55%, no RWMA, normal diastolic parameters, normal strain, normal RV size/function. Patient denies lower extremity edema, orthopnea or PND. He reports improved energy. Weight has been trending down with intentional weight loss. He has not noticed big increases overnight. He has not needed Lasix . Euvolemic and well compensated on exam.  -Continue irbesartan , as needed Lasix , Farxiga .  -Weigh daily.    Hyperlipidemia/hypertriglyceridemia Lipid panel November 2024 showed LDL 76, TG 429.  Patient was unable to take rosuvastatin  secondary to significant increase in blood sugar. He is working on improving his diet and has lost 14 lb.  - Continue Vascepa . - Continue lifestyle changes.  - Lipid panel and CMP today.  Disposition: Lipid panel and CMP today. Return in 6 months or sooner as needed.          Signed, Lonell Rives. Cruzito Standre, DNP, NP-C

## 2023-06-12 ENCOUNTER — Ambulatory Visit: Payer: BC Managed Care – PPO | Attending: Student | Admitting: Student

## 2023-06-12 ENCOUNTER — Other Ambulatory Visit: Payer: Self-pay | Admitting: Emergency Medicine

## 2023-06-12 ENCOUNTER — Encounter: Payer: Self-pay | Admitting: Student

## 2023-06-12 VITALS — BP 130/82 | HR 81 | Ht 69.0 in | Wt 209.0 lb

## 2023-06-12 DIAGNOSIS — Z79899 Other long term (current) drug therapy: Secondary | ICD-10-CM

## 2023-06-12 DIAGNOSIS — I10 Essential (primary) hypertension: Secondary | ICD-10-CM | POA: Diagnosis not present

## 2023-06-12 DIAGNOSIS — I2510 Atherosclerotic heart disease of native coronary artery without angina pectoris: Secondary | ICD-10-CM

## 2023-06-12 DIAGNOSIS — E782 Mixed hyperlipidemia: Secondary | ICD-10-CM

## 2023-06-12 DIAGNOSIS — I5032 Chronic diastolic (congestive) heart failure: Secondary | ICD-10-CM | POA: Diagnosis not present

## 2023-06-12 NOTE — Addendum Note (Signed)
 Addended by: Elin Guan A on: 06/12/2023 08:31 AM   Modules accepted: Orders

## 2023-06-12 NOTE — Patient Instructions (Signed)
 Medication Instructions:  Your Physician recommend you continue on your current medication as directed.    *If you need a refill on your cardiac medications before your next appointment, please call your pharmacy*  Lab Work: Your provider would like for you to have following labs drawn today CMet, and Lipid Panel.   If you have labs (blood work) drawn today and your tests are completely normal, you will receive your results only by: MyChart Message (if you have MyChart) OR A paper copy in the mail If you have any lab test that is abnormal or we need to change your treatment, we will call you to review the results.  Follow-Up: At [facility], you and your health needs are our priority.  As part of our continuing mission to provide you with exceptional heart care, our providers are all part of one team.  This team includes your primary Cardiologist (physician) and Advanced Practice Providers or APPs (Physician Assistants and Nurse Practitioners) who all work together to provide you with the care you need, when you need it.  Your next appointment:   6 month(s)  Provider:   You may see Randene Bustard, MD or one of the following Advanced Practice Providers on your designated Care Team:   Laneta Pintos, NP Gildardo Labrador, PA-C Varney Gentleman, PA-C Cadence Hampton, PA-C Ronald Cockayne, NP Morey Ar, NP    We recommend signing up for the patient portal called "MyChart".  Sign up information is provided on this After Visit Summary.  MyChart is used to connect with patients for Virtual Visits (Telemedicine).  Patients are able to view lab/test results, encounter notes, upcoming appointments, etc.  Non-urgent messages can be sent to your provider as well.   To learn more about what you can do with MyChart, go to [email or web address].

## 2023-06-13 ENCOUNTER — Ambulatory Visit: Payer: Self-pay | Admitting: Student

## 2023-06-13 LAB — LIPID PANEL
Chol/HDL Ratio: 7.7 ratio — ABNORMAL HIGH (ref 0.0–5.0)
Cholesterol, Total: 201 mg/dL — ABNORMAL HIGH (ref 100–199)
HDL: 26 mg/dL — ABNORMAL LOW (ref >39)
LDL Chol Calc (NIH): 97 mg/dL (ref 0–99)
Triglycerides: 464 mg/dL — ABNORMAL HIGH (ref 0–149)
VLDL Cholesterol Cal: 78 mg/dL — ABNORMAL HIGH (ref 5–40)

## 2023-06-13 LAB — COMPREHENSIVE METABOLIC PANEL WITH GFR
ALT: 26 IU/L (ref 0–44)
AST: 29 IU/L (ref 0–40)
Albumin: 4.7 g/dL (ref 3.8–4.9)
Alkaline Phosphatase: 87 IU/L (ref 44–121)
BUN/Creatinine Ratio: 17 (ref 9–20)
BUN: 16 mg/dL (ref 6–24)
Bilirubin Total: 0.9 mg/dL (ref 0.0–1.2)
CO2: 20 mmol/L (ref 20–29)
Calcium: 9.9 mg/dL (ref 8.7–10.2)
Chloride: 100 mmol/L (ref 96–106)
Creatinine, Ser: 0.96 mg/dL (ref 0.76–1.27)
Globulin, Total: 2.8 g/dL (ref 1.5–4.5)
Glucose: 201 mg/dL — ABNORMAL HIGH (ref 70–99)
Potassium: 4.4 mmol/L (ref 3.5–5.2)
Sodium: 139 mmol/L (ref 134–144)
Total Protein: 7.5 g/dL (ref 6.0–8.5)
eGFR: 92 mL/min/{1.73_m2} (ref >59)

## 2023-06-13 NOTE — Telephone Encounter (Signed)
-----   Message from Morey Ar sent at 06/13/2023  2:02 PM EDT ----- Please let patient know kidney function and electrolytes are normal. LDL has increased to 97 (from 76). Triglycerides are higher as well at 464 (from 429). Will you please ask patient if he is interested in trying one of the injectable cholesterol medications. If he is I would like to refer him to the pharm D lipid clinic in [place].   Thank you!  DW

## 2023-06-21 ENCOUNTER — Ambulatory Visit: Admission: EM | Admit: 2023-06-21 | Discharge: 2023-06-21 | Disposition: A | Discharge status: Home / Self Care

## 2023-06-21 ENCOUNTER — Inpatient Hospital Stay: Admission: RE | Admit: 2023-06-21 | Source: Ambulatory Visit

## 2023-06-21 DIAGNOSIS — R338 Other retention of urine: Secondary | ICD-10-CM | POA: Insufficient documentation

## 2023-06-21 LAB — URINALYSIS, W/ REFLEX TO CULTURE (INFECTION SUSPECTED)
Bacteria, UA: NONE SEEN
Bilirubin Urine: NEGATIVE
Glucose, UA: 500 mg/dL — AB
Hgb urine dipstick: NEGATIVE
Ketones, ur: NEGATIVE mg/dL
Leukocytes,Ua: NEGATIVE
Nitrite: NEGATIVE
Protein, ur: NEGATIVE mg/dL
RBC / HPF: NONE SEEN RBC/hpf (ref 0–5)
Specific Gravity, Urine: 1.01 (ref 1.005–1.030)
Squamous Epithelial / HPF: NONE SEEN /HPF (ref 0–5)
WBC, UA: NONE SEEN WBC/hpf (ref 0–5)
pH: 5.5 (ref 5.0–8.0)

## 2023-06-21 MED ORDER — TAMSULOSIN HCL 0.4 MG PO CAPS
0.4000 mg | ORAL_CAPSULE | Freq: Every day | ORAL | 0 refills | Status: DC
Start: 2023-06-21 — End: 2023-09-13

## 2023-06-21 MED ORDER — PHENAZOPYRIDINE HCL 200 MG PO TABS: 200.0000 mg | ORAL_TABLET | Freq: Three times a day (TID) | ORAL | 0 refills | Status: DC

## 2023-06-21 NOTE — Discharge Instructions (Signed)
  1. Acute urinary retention (Primary) - Urinalysis completed in lab and shows glucose of 500 mg/dL, no nitrite, no leukocytes, no blood, no sign of urinary tract infection. - phenazopyridine (PYRIDIUM) 200 MG tablet; Take 1 tablet (200 mg total) by mouth 3 (three) times daily.  Dispense: 6 tablet; Refill: 0 - tamsulosin (FLOMAX) 0.4 MG CAPS capsule; Take 1 capsule (0.4 mg total) by mouth daily.  Dispense: 30 capsule; Refill: 0 - Drink plenty of fluids to flush bladder of any excess glucose, try to control blood sugars is much as you can and continue to monitor for any signs of secondary infection such as dysuria, increased urinary frequency, abdominal pain, flank pain. -Continue to monitor symptoms for any change in severity if there is any escalation of current symptoms or development of new symptoms follow-up in ER for further evaluation and management.

## 2023-06-21 NOTE — ED Provider Notes (Signed)
 [facility]  Note:  This document was prepared using Conservation officer, historic buildings and may include unintentional dictation errors.  MRN: [id number] DOB: 1965/05/22  Subjective:   Robert Franklin is a 58 y.o. male presenting for suprapubic abdominal pressure with radiation to the lower back x 4 to 5 days.  Patient also reports abnormal urine odor and increased concentration of urine during that time.  Patient reports past history of kidney stones and benign prostate hypertrophy which may be contributing to his symptoms.  Patient denies any fever, severe abdominal pain, severe flank pain, penile discharge, or fever.  No current facility-administered medications for this encounter.  Current Outpatient Medications:    butalbital -acetaminophen -caffeine  (FIORICET) 50-325-40 MG tablet, TAKE 1 TABLET BY MOUTH 2 (TWO) TIMES DAILY AS NEEDED FOR HEADACHE., Disp: 30 tablet, Rfl: 4   CINNAMON PO, Take 1,200 mg by mouth daily., Disp: , Rfl:    Continuous Blood Gluc Sensor (FREESTYLE LIBRE 3 SENSOR) MISC, 1 EACH BY DOES NOT APPLY ROUTE DAILY. PLACE 1 SENSOR ON THE SKIN EVERY 14 DAYS. USE TO CHECK GLUCOSE CONTINUOUSLY, Disp: 2 each, Rfl: 12   dapagliflozin  propanediol (FARXIGA ) 10 MG TABS tablet, Take 1 tablet (10 mg total) by mouth daily before breakfast., Disp: 90 tablet, Rfl: 3   furosemide  (LASIX ) 20 MG tablet, TAKE 1 TABLET DAILY AS NEEDED (FOR WEIGHT GAIN OF 3 POUNDS OVERNIGHT OR 5 POUNDS IN A WEEK)., Disp: 90 tablet, Rfl: 1   irbesartan  (AVAPRO ) 150 MG tablet, TAKE 1 TABLET BY MOUTH EVERY DAY, Disp: 90 tablet, Rfl: 3   magnesium  gluconate (MAGONATE) 500 MG tablet, Take 500 mg by mouth daily., Disp: , Rfl:    metoprolol  succinate (TOPROL -XL) 50 MG 24 hr tablet, TAKE 1 TABLET BY MOUTH DAILY. TAKE WITH OR IMMEDIATELY FOLLOWING A MEAL., Disp: 90 tablet, Rfl: 3   phenazopyridine (PYRIDIUM) 200 MG tablet, Take 1 tablet (200 mg total) by mouth 3 (three) times daily., Disp: 6 tablet, Rfl: 0    tadalafil  (CIALIS ) 20 MG tablet, TAKE 1 TABLET (20 MG TOTAL) BY MOUTH EVERY THREE (3) DAYS AS NEEDED FOR ERECTILE DYSFUNCTION., Disp: 8 tablet, Rfl: 7   tamsulosin (FLOMAX) 0.4 MG CAPS capsule, Take 1 capsule (0.4 mg total) by mouth daily., Disp: 30 capsule, Rfl: 0   VASCEPA  1 g capsule, TAKE 2 CAPSULES BY MOUTH TWICE A DAY, Disp: 360 capsule, Rfl: 1   BIOTIN PO, Take 10,000 mcg by mouth 2 (two) times daily., Disp: , Rfl:    bismuth subsalicylate (PEPTO BISMOL) 262 MG/15ML suspension, Take 30 mLs by mouth every 6 (six) hours as needed for indigestion., Disp: , Rfl:    Charcoal Activated (ACTIVATED CHARCOAL PO), Take 260 mg by mouth 2 (two) times daily., Disp: , Rfl:    ibuprofen  (ADVIL ) 800 MG tablet, Take 1 tablet (800 mg total) by mouth every 8 (eight) hours as needed (pain)., Disp: 21 tablet, Rfl: 0   omeprazole  (PRILOSEC  OTC) 20 MG tablet, Take 1 tablet (20 mg total) by mouth daily. (Patient taking differently: Take 20 mg by mouth as needed.), Disp: 30 tablet, Rfl: 12   ondansetron  (ZOFRAN -ODT) 4 MG disintegrating tablet, Take 1 tablet (4 mg total) by mouth every 8 (eight) hours as needed for nausea or vomiting., Disp: 20 tablet, Rfl: 0   pioglitazone  (ACTOS ) 30 MG tablet, TAKE 1 TABLET BY MOUTH EVERY DAY (Patient not taking: Reported on 06/12/2023), Disp: 90 tablet, Rfl: 0   Potassium 99 MG TABS, Take 99 mg by mouth daily., Disp: ,  Rfl:    Probiotic Product (ALIGN) 4 MG CAPS, Take 4 mg by mouth daily., Disp: , Rfl:    Saw Palmetto, Serenoa repens, (SAW PALMETTO PO), Take 1,400 mg by mouth daily., Disp: , Rfl:    Simethicone 125 MG CAPS, Take 125 mg by mouth daily as needed (gas)., Disp: , Rfl:    TURMERIC PO, Take 2,600 mg by mouth 2 (two) times daily., Disp: , Rfl:    No Known Allergies  Past Medical History:  Diagnosis Date   Coronary artery disease, non-occlusive 02/28/2008   Dr. Meredeth Stallion @ [facility]: 2 x 40-50% lesions in dominant LCx lesion by cath,  EF 60% with normal wall motion.;  Follow  catheterization January 2021 showed stable findings.  LCx lesions likely not stenoses, more normal diameter vessel in setting of diffuse ectasia/aneurysm.   Diabetes mellitus without complication (HCC)    Essential hypertension    GERD (gastroesophageal reflux disease)    Headache    High triglycerides     was previously on treatment, but  he notes that this was stopped   Kidney stones    Moderate obesity 05/29/2014   OSA treated with BiPAP    Short bowel syndrome 1994     Past Surgical History:  Procedure Laterality Date   "stomach wrap"  1991   GERD   APPENDECTOMY     CARDIAC CATHETERIZATION  02/2008   Dr. Meredeth Stallion @ [facility]: 2 x 40-50% lesions in a dominant LCx by cath,  EF 60% with normal wall motion.   Cardiolite Nuclear Stress Test  01/2008    heart rate increased from 80-158 BPM. No EKG changes.  there is a moderate sized  reversible defect in the inferior and inferior lateral wall as well as anteroseptal. EF 59%.   CardioNet Holter Monitor  01/2008    sinus rhythm. Rare PVCs in setting was. Current PACs , seen in singles and pairs. Max heart rate 127, minimum heart rate 57. Average 84.   CHOLECYSTECTOMY     COLON SURGERY     due to gangrenous appendix   COLONOSCOPY WITH PROPOFOL  N/A 04/26/2016   Procedure: COLONOSCOPY WITH PROPOFOL ;  Surgeon: Luke Salaam, MD;  Location: [facility] ENDOSCOPY;  Service: Endoscopy;  Laterality: N/A;   ESOPHAGOGASTRODUODENOSCOPY (EGD) WITH PROPOFOL  N/A 01/11/2019   Procedure: ESOPHAGOGASTRODUODENOSCOPY (EGD) WITH PROPOFOL ;  Surgeon: Luke Salaam, MD;  Location: [facility] ENDOSCOPY;  Service: Gastroenterology;  Laterality: N/A;   FLEXIBLE SIGMOIDOSCOPY N/A 01/11/2019   Procedure: FLEXIBLE SIGMOIDOSCOPY;  Surgeon: Luke Salaam, MD;  Location: [facility] ENDOSCOPY;  Service: Gastroenterology;  Laterality: N/A;   HERNIA REPAIR  2010   INNER EAR SURGERY     LEFT HEART CATH AND CORONARY ANGIOGRAPHY N/A 02/12/2019   Procedure: LEFT HEART CATH AND CORONARY ANGIOGRAPHY;   Surgeon: Arleen Lacer, MD;  Location: [facility] INVASIVE CV LAB;  Service: Cardiovascular; Proximal RCA 40%.  Diffuse aneurysmal/ectatic (patulous) dominant LCx with no significant lesions.  Questionable 40 to 50% stenosis-most likely actually just normal diameter with surrounding ectasia/aneurysmal segments.  EF 55 to 60%.  Normal EDP.   NM MYOVIEW  LTD  01/10/2019   Treadmill Myoview : Walk 9:40 min-10.1 METS) EF ~45 to 50%.  INTERMEDIATE RISK STUDY-moderate sized mostly fixed perfusion defect in anterior apical segment.  No reversibility.  Suggests prior infarct without ischemia.   TONSILLECTOMY     TYMPANOSTOMY TUBE PLACEMENT Bilateral 1973    Family History  Problem Relation Age of Onset   Hypertension Mother    Irritable bowel syndrome Mother  Migraines Mother    Lumbar disc disease Mother    Heart attack Mother    Heart attack Maternal Uncle    Heart attack Maternal Grandfather     Social History   Tobacco Use   Smoking status: Former    Current packs/day: 0.00    Average packs/day: 1 pack/day for 28.0 years (28.0 ttl pk-yrs)    Types: Cigarettes    Start date: 12/30/1977    Quit date: 12/30/2005    Years since quitting: 17.4   Smokeless tobacco: Never  Vaping Use   Vaping status: Never Used  Substance Use Topics   Alcohol use: Yes    Comment: occ   Drug use: Not Currently    ROS Refer to HPI for ROS details.  Objective:   Vitals: BP (!) 149/91 (BP Location: Right Arm)   Pulse 75   Temp 98.4 F (36.9 C) (Oral)   Resp 16   SpO2 95%   Physical Exam Vitals and nursing note reviewed.  Constitutional:      General: He is not in acute distress.    Appearance: He is well-developed. He is not ill-appearing or toxic-appearing.  HENT:     Head: Normocephalic.  Cardiovascular:     Rate and Rhythm: Normal rate.  Pulmonary:     Effort: Pulmonary effort is normal. No respiratory distress.  Abdominal:     General: There is no distension.     Palpations: Abdomen is  soft.     Tenderness: There is abdominal tenderness (Suprapubic abdominal pressure with palpation, no sharp pains, no rebound tenderness.). There is no right CVA tenderness, left CVA tenderness, guarding or rebound.  Skin:    General: Skin is warm and dry.  Neurological:     General: No focal deficit present.     Mental Status: He is alert and oriented to person, place, and time.  Psychiatric:        Mood and Affect: Mood normal.        Behavior: Behavior normal.     Procedures  Results for orders placed or performed during the hospital encounter of 06/21/23 (from the past 24 hours)  Urinalysis, w/ Reflex to Culture (Infection Suspected) -Urine, Clean Catch     Status: Abnormal   Collection Time: 06/21/23 11:17 AM  Result Value Ref Range   Specimen Source URINE, CLEAN CATCH    Color, Urine YELLOW YELLOW   APPearance CLEAR CLEAR   Specific Gravity, Urine 1.010 1.005 - 1.030   pH 5.5 5.0 - 8.0   Glucose, UA 500 (A) NEGATIVE mg/dL   Hgb urine dipstick NEGATIVE NEGATIVE   Bilirubin Urine NEGATIVE NEGATIVE   Ketones, ur NEGATIVE NEGATIVE mg/dL   Protein, ur NEGATIVE NEGATIVE mg/dL   Nitrite NEGATIVE NEGATIVE   Leukocytes,Ua NEGATIVE NEGATIVE   Squamous Epithelial / HPF NONE SEEN 0 - 5 /HPF   WBC, UA NONE SEEN 0 - 5 WBC/hpf   RBC / HPF NONE SEEN 0 - 5 RBC/hpf   Bacteria, UA NONE SEEN NONE SEEN    Assessment and Plan :     Discharge Instructions       1. Acute urinary retention (Primary) - Urinalysis completed in lab and shows glucose of 500 mg/dL, no nitrite, no leukocytes, no blood, no sign of urinary tract infection. - phenazopyridine (PYRIDIUM) 200 MG tablet; Take 1 tablet (200 mg total) by mouth 3 (three) times daily.  Dispense: 6 tablet; Refill: 0 - tamsulosin (FLOMAX) 0.4 MG CAPS capsule; Take 1 capsule (  0.4 mg total) by mouth daily.  Dispense: 30 capsule; Refill: 0 - Drink plenty of fluids to flush bladder of any excess glucose, try to control blood sugars is much as  you can and continue to monitor for any signs of secondary infection such as dysuria, increased urinary frequency, abdominal pain, flank pain. -Continue to monitor symptoms for any change in severity if there is any escalation of current symptoms or development of new symptoms follow-up in ER for further evaluation and management.    Lindon Kiel B Jurnee Nakayama   Sendy Pluta, [place], [place] 06/21/23 1212

## 2023-06-21 NOTE — ED Triage Notes (Signed)
 Lower abdominal pressure with back pain x 5 days  Urine has an odor.

## 2023-06-22 ENCOUNTER — Ambulatory Visit

## 2023-06-23 ENCOUNTER — Other Ambulatory Visit: Payer: Self-pay | Admitting: Physician Assistant

## 2023-06-23 DIAGNOSIS — N529 Male erectile dysfunction, unspecified: Secondary | ICD-10-CM

## 2023-06-23 DIAGNOSIS — E119 Type 2 diabetes mellitus without complications: Secondary | ICD-10-CM

## 2023-06-23 NOTE — Telephone Encounter (Signed)
 Copied from CRM [id number]. Topic: Clinical - Medication Refill >> Jun 23, 2023  3:30 PM Danna Duster wrote: Medication: tadalafil  (CIALIS ) 20 MG tablet, Continuous Blood Gluc Sensor (FREESTYLE LIBRE 3 SENSOR) MISC and patient mentioned a third medications not on list: Fioricet 50-325-50MG  tablets that the patient needs an Rx order for.   Has the patient contacted their pharmacy? Yes (Agent: If no, request that the patient contact the pharmacy for the refill. If patient does not wish to contact the pharmacy document the reason why and proceed with request.) (Agent: If yes, when and what did the pharmacy advise?)  This is the patient's preferred pharmacy:  [id number] - [address] [place] [address] Phone: [phone] Fax: [phone]  Is this the correct pharmacy for this prescription? Yes If no, delete pharmacy and type the correct one.   Has the prescription been filled recently? Yes  Fioricet 50-325-50MG  tablets that the patient needs an Rx order. Patient has not had this medication rilled recently, but needs it for his migraines  Is the patient out of the medication? Yes  Has the patient been seen for an appointment in the last year OR does the patient have an upcoming appointment? Yes  Can we respond through MyChart? Yes  Agent: Please be advised that Rx refills may take up to 3 business days. We ask that you follow-up with your pharmacy.

## 2023-06-26 ENCOUNTER — Other Ambulatory Visit: Payer: Self-pay

## 2023-06-26 DIAGNOSIS — E119 Type 2 diabetes mellitus without complications: Secondary | ICD-10-CM

## 2023-06-26 DIAGNOSIS — N529 Male erectile dysfunction, unspecified: Secondary | ICD-10-CM

## 2023-06-26 NOTE — Telephone Encounter (Signed)
 Requested medication (s) are due for refill today: yes  Requested medication (s) are on the active medication list: yes  Last refill:    Future visit scheduled: yes has TOC appt upcoming with Janna   Notes to clinic:  previously prescribed from Dr Oletta Berry and Heidi Llamas, PA.      Requested Prescriptions  Pending Prescriptions Disp Refills   tadalafil  (CIALIS ) 20 MG tablet 8 tablet 7    Sig: Take 1 tablet (20 mg total) by mouth every three (3) days as needed for erectile dysfunction.     There is no refill protocol information for this order     Continuous Glucose Sensor (FREESTYLE LIBRE 3 SENSOR) MISC 2 each 12    Sig: 1 each by Does not apply route daily. Place 1 sensor on the skin every 14 days. Use to check glucose continuously     There is no refill protocol information for this order     butalbital -acetaminophen -caffeine  (FIORICET) 50-325-40 MG tablet 30 tablet 4    Sig: Take 1 tablet by mouth 2 (two) times daily as needed for headache.     There is no refill protocol information for this order    Refused Prescriptions Disp Refills   tadalafil  (CIALIS ) 20 MG tablet [Pharmacy Med Name: TADALAFIL  20 MG TABLET] 8 tablet 7    Sig: TAKE 1 TABLET (20 MG TOTAL) BY MOUTH EVERY THREE (3) DAYS AS NEEDED FOR ERECTILE DYSFUNCTION.     There is no refill protocol information for this order     Continuous Glucose Sensor (FREESTYLE LIBRE 3 SENSOR) MISC [Pharmacy Med Name: FREESTYLE LIBRE 3 SENSOR] 2 each 12    Sig: 1 EACH BY DOES NOT APPLY ROUTE DAILY. PLACE 1 SENSOR ON THE SKIN EVERY 14 DAYS. USE TO CHECK GLUCOSE CONTINUOUSLY     There is no refill protocol information for this order

## 2023-06-27 ENCOUNTER — Other Ambulatory Visit: Payer: Self-pay | Admitting: Family Medicine

## 2023-06-27 DIAGNOSIS — E119 Type 2 diabetes mellitus without complications: Secondary | ICD-10-CM

## 2023-06-27 MED ORDER — FREESTYLE LIBRE 3 SENSOR MISC: 1.0000 | Freq: Every day | 12 refills | Status: DC

## 2023-06-27 MED ORDER — TADALAFIL 20 MG PO TABS: 20.0000 mg | ORAL_TABLET | ORAL | 0 refills | Status: DC | PRN

## 2023-06-27 MED ORDER — BUTALBITAL-APAP-CAFFEINE 50-325-40 MG PO TABS: 1.0000 | ORAL_TABLET | Freq: Two times a day (BID) | ORAL | 0 refills | Status: DC | PRN

## 2023-07-09 ENCOUNTER — Other Ambulatory Visit: Payer: Self-pay | Admitting: Family Medicine

## 2023-07-21 ENCOUNTER — Ambulatory Visit: Admitting: Physician Assistant

## 2023-07-21 ENCOUNTER — Encounter: Payer: Self-pay | Admitting: Physician Assistant

## 2023-07-21 VITALS — BP 136/98 | HR 83 | Resp 16 | Ht 69.0 in | Wt 206.0 lb

## 2023-07-21 DIAGNOSIS — E782 Mixed hyperlipidemia: Secondary | ICD-10-CM | POA: Diagnosis not present

## 2023-07-21 DIAGNOSIS — E1159 Type 2 diabetes mellitus with other circulatory complications: Secondary | ICD-10-CM

## 2023-07-21 DIAGNOSIS — I2510 Atherosclerotic heart disease of native coronary artery without angina pectoris: Secondary | ICD-10-CM

## 2023-07-21 DIAGNOSIS — E1169 Type 2 diabetes mellitus with other specified complication: Secondary | ICD-10-CM | POA: Diagnosis not present

## 2023-07-21 DIAGNOSIS — E1142 Type 2 diabetes mellitus with diabetic polyneuropathy: Secondary | ICD-10-CM

## 2023-07-21 DIAGNOSIS — I491 Atrial premature depolarization: Secondary | ICD-10-CM

## 2023-07-21 DIAGNOSIS — I509 Heart failure, unspecified: Secondary | ICD-10-CM

## 2023-07-21 DIAGNOSIS — G43809 Other migraine, not intractable, without status migrainosus: Secondary | ICD-10-CM

## 2023-07-21 DIAGNOSIS — Z789 Other specified health status: Secondary | ICD-10-CM

## 2023-07-21 DIAGNOSIS — F4323 Adjustment disorder with mixed anxiety and depressed mood: Secondary | ICD-10-CM

## 2023-07-21 DIAGNOSIS — I152 Hypertension secondary to endocrine disorders: Secondary | ICD-10-CM

## 2023-07-21 DIAGNOSIS — E785 Hyperlipidemia, unspecified: Secondary | ICD-10-CM

## 2023-07-21 DIAGNOSIS — N139 Obstructive and reflux uropathy, unspecified: Secondary | ICD-10-CM

## 2023-07-21 MED ORDER — ESCITALOPRAM OXALATE 10 MG PO TABS: 10.0000 mg | ORAL_TABLET | Freq: Every day | ORAL | 1 refills | Status: DC

## 2023-07-21 MED ORDER — BUTALBITAL-APAP-CAFFEINE 50-325-40 MG PO TABS
1.0000 | ORAL_TABLET | Freq: Two times a day (BID) | ORAL | 0 refills | Status: DC | PRN
Start: 2023-07-21 — End: 2023-08-30

## 2023-07-21 NOTE — Progress Notes (Signed)
 Established patient visit  Patient: Robert Franklin   DOB: 07-28-1965   58 y.o. Male  MRN: [id number] Visit Date: 07/21/2023  Today's healthcare provider: Jolynn Spencer, PA-C   Chief Complaint  Patient presents with   Transitions Of Care    TOC..Meds refill Pt wants labs Mainly A1C. Pt headaches has gotten worse . Prostate concerns as well with no being able to sleep.    Subjective     HPI     Transitions Of Care    Additional comments: TOC..Meds refill Pt wants labs Mainly A1C. Pt headaches has gotten worse . Prostate concerns as well with no being able to sleep.       Last edited by Marylen Odella CROME, CMA on 07/21/2023 10:44 AM.       Discussed the use of AI scribe software for clinical note transcription with the patient, who gave verbal consent to proceed.  History of Present Illness Robert Franklin is a 58 year old male with hypertension, heart failure, and migraines who presents for medication refill and management of increased migraine frequency.  He experiences migraines almost daily, sometimes two to three times a day, with increased intensity. He has used fifteen of thirty Fioricet tablets in the last two weeks, with minimal relief. He attributes the increase in migraines to high stress levels from work and home life.  He takes Toprol  and Irbesartan  for hypertension, but his blood pressure remains uncontrolled. He previously used three medications for blood pressure. He monitors his blood pressure at home with digital and manual cuffs. For heart failure, he takes Lasix  and Farxiga  and monitors his weight daily. No leg swelling is present.  He takes Vascepa  and follows lifestyle modifications for high triglycerides, which were previously as high as 800. He manages diabetes through diet, as previous cholesterol medications adversely affected his blood sugar. He is aware of potential elevation in A1c due to dietary changes and stress-related eating.  He experiences  urinary symptoms, including a stop-and-go stream and occasional smelly urine, with a history of urinary infection. He has erectile dysfunction and takes Cialis . He lives with his wife and mother, contributing to high stress levels, with cultural differences sometimes causing tension at home.       07/21/2023   11:41 AM 06/15/2022    4:13 PM 02/11/2022    4:56 PM  Depression screen PHQ 2/9  Decreased Interest 0 0 2  Down, Depressed, Hopeless 0 0 0  PHQ - 2 Score 0 0 2  Altered sleeping 1 0 0  Tired, decreased energy 3 1 3   Change in appetite 0 0 3  Feeling bad or failure about yourself  0 0 0  Trouble concentrating 0 0 2  Moving slowly or fidgety/restless 0 0 0  Suicidal thoughts 0 0 0  PHQ-9 Score 4 1 10   Difficult doing work/chores Very difficult Somewhat difficult Extremely dIfficult      07/21/2023   11:42 AM  GAD 7 : Generalized Anxiety Score  Nervous, Anxious, on Edge 2  Control/stop worrying 2  Worry too much - different things 2  Trouble relaxing 0  Restless 1  Easily annoyed or irritable 2  Afraid - awful might happen 1  Total GAD 7 Score 10  Anxiety Difficulty Very difficult    Medications: Outpatient Medications Prior to Visit  Medication Sig   BIOTIN PO Take 10,000 mcg by mouth 2 (two) times daily.   bismuth subsalicylate (PEPTO BISMOL) 262 MG/15ML suspension Take 30 mLs by  mouth every 6 (six) hours as needed for indigestion.   Charcoal Activated (ACTIVATED CHARCOAL PO) Take 260 mg by mouth 2 (two) times daily.   CINNAMON PO Take 1,200 mg by mouth daily.   Continuous Glucose Sensor (FREESTYLE LIBRE 3 SENSOR) MISC 1 each by Does not apply route daily. Place 1 sensor on the skin every 14 days. Use to check glucose continuously   dapagliflozin  propanediol (FARXIGA ) 10 MG TABS tablet Take 1 tablet (10 mg total) by mouth daily before breakfast.   furosemide  (LASIX ) 20 MG tablet TAKE 1 TABLET DAILY AS NEEDED (FOR WEIGHT GAIN OF 3 POUNDS OVERNIGHT OR 5 POUNDS IN A  WEEK).   ibuprofen  (ADVIL ) 800 MG tablet Take 1 tablet (800 mg total) by mouth every 8 (eight) hours as needed (pain).   irbesartan  (AVAPRO ) 150 MG tablet TAKE 1 TABLET BY MOUTH EVERY DAY   magnesium  gluconate (MAGONATE) 500 MG tablet Take 500 mg by mouth daily.   metoprolol  succinate (TOPROL -XL) 50 MG 24 hr tablet TAKE 1 TABLET BY MOUTH DAILY. TAKE WITH OR IMMEDIATELY FOLLOWING A MEAL.   omeprazole  (PRILOSEC  OTC) 20 MG tablet Take 1 tablet (20 mg total) by mouth daily. (Patient taking differently: Take 20 mg by mouth as needed.)   ondansetron  (ZOFRAN -ODT) 4 MG disintegrating tablet Take 1 tablet (4 mg total) by mouth every 8 (eight) hours as needed for nausea or vomiting.   phenazopyridine  (PYRIDIUM ) 200 MG tablet Take 1 tablet (200 mg total) by mouth 3 (three) times daily.   Potassium 99 MG TABS Take 99 mg by mouth daily.   Probiotic Product (ALIGN) 4 MG CAPS Take 4 mg by mouth daily.   Saw Palmetto, Serenoa repens, (SAW PALMETTO PO) Take 1,400 mg by mouth daily.   Simethicone 125 MG CAPS Take 125 mg by mouth daily as needed (gas).   tadalafil  (CIALIS ) 20 MG tablet Take 1 tablet (20 mg total) by mouth every three (3) days as needed for erectile dysfunction.   tamsulosin  (FLOMAX ) 0.4 MG CAPS capsule Take 1 capsule (0.4 mg total) by mouth daily.   TURMERIC PO Take 2,600 mg by mouth 2 (two) times daily.   VASCEPA  1 g capsule TAKE 2 CAPSULES BY MOUTH TWICE A DAY   [DISCONTINUED] butalbital -acetaminophen -caffeine  (FIORICET) 50-325-40 MG tablet Take 1 tablet by mouth 2 (two) times daily as needed for headache.   pioglitazone  (ACTOS ) 30 MG tablet TAKE 1 TABLET BY MOUTH EVERY DAY (Patient not taking: Reported on 07/21/2023)   No facility-administered medications prior to visit.    Review of Systems All negative Except see HPI       Objective    BP (!) 136/98 (BP Location: Right Arm, Patient Position: Sitting, Cuff Size: Large)   Pulse 83   Resp 16   Ht 5' 9 (1.753 m)   Wt 206 lb (93.4 kg)    SpO2 100%   BMI 30.42 kg/m     Physical Exam Vitals reviewed.  Constitutional:      General: He is not in acute distress.    Appearance: Normal appearance. He is obese. He is not diaphoretic.  HENT:     Head: Normocephalic and atraumatic.   Eyes:     General: No scleral icterus.    Conjunctiva/sclera: Conjunctivae normal.    Cardiovascular:     Rate and Rhythm: Normal rate and regular rhythm.     Pulses: Normal pulses.     Heart sounds: Normal heart sounds. No murmur heard. Pulmonary:  Effort: Pulmonary effort is normal. No respiratory distress.     Breath sounds: Normal breath sounds. No wheezing or rhonchi.   Musculoskeletal:     Cervical back: Neck supple.     Right lower leg: No edema.     Left lower leg: No edema.  Lymphadenopathy:     Cervical: No cervical adenopathy.   Skin:    General: Skin is warm and dry.     Findings: No rash.   Neurological:     Mental Status: He is alert and oriented to person, place, and time. Mental status is at baseline.   Psychiatric:        Mood and Affect: Mood normal.        Behavior: Behavior normal.      No results found for any visits on 07/21/23.      Assessment & Plan Migraine Increased frequency and intensity, with reduced efficacy of Fioricet. High stress is a contributing factor. Resistant to therapy but open to medication adjustments. - Refer to neurology for migraine management. - Discuss potential use of triptans or newer medications like Nurtec or ect, depending on insurance coverage.  Hypertension Chronic Blood pressure not well controlled with current medications. Emphasized lifestyle modifications and regular monitoring. - Continue Toprol  and Irbesartan . - Monitor blood pressure daily.   Coronary artery disease, non-occlusive Atrial premature contractions Heart Failure Chronic With improved LV function Echo 04/2023 showed EF 50 to 55%Condition well-managed with Lasix  and Farxiga . No symptoms  of leg swelling. Reinforced adherence to medication and monitoring regimen. - Continue Lasix  and Farxiga . - Monitor weight daily. Follow-up with cardiology  Hyperlipidemia Chronic and unstable High triglycerides despite Vascepa . Previous cholesterol medication exacerbated diabetes. Emphasized lifestyle modifications and dietary management. - Continue Vascepa . - Implement lifestyle modifications for cholesterol management. Lost more than 14 pounds Will follow-up  Diabetes Mellitus Chronic Managed through dietary control. Avoids medications like Metformin  due to adverse effects. Potential high A1c due to stress-induced dietary changes. Emphasized consistent dietary habits. - Consult American Diabetic Association website for dietary guidance. - Consider frequent meals to stabilize blood sugar levels. Will follow-up  Depression and Anxiety High stress from work and home leads to anxiety and potential depression. Resistant to therapy but open to low-dose antidepressant. Discussed benefits of exercise and relaxation techniques. - Start low-dose antidepressant. - Encourage exercise and relaxation techniques.  Lower urinary obstructive symptom Symptoms include stop-and-go urine flow and smelly urine. Previous treatment for infection was temporarily effective. Discussed checking PSA levels for prostate cancer screening. - Check PSA levels. - Monitor urinary symptoms.  Type 2 diabetes mellitus with diabetic polyneuropathy, without long-term current use of insulin (HCC) (Primary)  - CBC with Differential/Platelet - Comprehensive metabolic panel with GFR - Hemoglobin A1c - Lipid panel - TSH - PSA  Hypertension associated with diabetes (HCC)  - butalbital -acetaminophen -caffeine  (FIORICET) 50-325-40 MG tablet; Take 1 tablet by mouth 2 (two) times daily as needed for headache.  Dispense: 30 tablet; Refill: 0 - CBC with Differential/Platelet - Comprehensive metabolic panel with GFR -  Hemoglobin A1c - Lipid panel - TSH - escitalopram (LEXAPRO) 10 MG tablet; Take 1 tablet (10 mg total) by mouth daily.  Dispense: 30 tablet; Refill: 1  Hyperlipidemia associated with type 2 diabetes mellitus (HCC)  - butalbital -acetaminophen -caffeine  (FIORICET) 50-325-40 MG tablet; Take 1 tablet by mouth 2 (two) times daily as needed for headache.  Dispense: 30 tablet; Refill: 0 - CBC with Differential/Platelet - Comprehensive metabolic panel with GFR - Hemoglobin A1c - Lipid panel -  TSH - escitalopram (LEXAPRO) 10 MG tablet; Take 1 tablet (10 mg total) by mouth daily.  Dispense: 30 tablet; Refill: 1  Other migraine without status migrainosus, not intractable  - butalbital -acetaminophen -caffeine  (FIORICET) 50-325-40 MG tablet; Take 1 tablet by mouth 2 (two) times daily as needed for headache.  Dispense: 30 tablet; Refill: 0 - CBC with Differential/Platelet - Comprehensive metabolic panel with GFR - Hemoglobin A1c - Lipid panel - TSH   Transition of care From [facility]    Adjustment disorder with mixed anxiety and depressed mood - escitalopram (LEXAPRO) 10 MG tablet; Take 1 tablet (10 mg total) by mouth daily.  Dispense: 30 tablet; Refill: 1  Orders Placed This Encounter  Procedures   CBC with Differential/Platelet   Comprehensive metabolic panel with GFR    Has the patient fasted?:   Yes   Hemoglobin A1c   Lipid panel    Has the patient fasted?:   Yes   TSH   PSA    Return in about 4 weeks (around 08/18/2023) for chronic disease f/u.   The patient was advised to call back or seek an in-person evaluation if the symptoms worsen or if the condition fails to improve as anticipated.  I discussed the assessment and treatment plan with the patient. The patient was provided an opportunity to ask questions and all were answered. The patient agreed with the plan and demonstrated an understanding of the instructions.  I, Tyteanna Ost, PA-C have reviewed all documentation for  this visit. The documentation on 07/21/2023  for the exam, diagnosis, procedures, and orders are all accurate and complete.  Jolynn Spencer, [facility], MMS [facility] [phone] (phone) [phone] (fax)  [facility]

## 2023-07-22 LAB — COMPREHENSIVE METABOLIC PANEL WITH GFR
ALT: 24 IU/L (ref 0–44)
AST: 26 IU/L (ref 0–40)
Albumin: 4.6 g/dL (ref 3.8–4.9)
Alkaline Phosphatase: 108 IU/L (ref 44–121)
BUN/Creatinine Ratio: 21 — ABNORMAL HIGH (ref 9–20)
BUN: 24 mg/dL (ref 6–24)
Bilirubin Total: 0.6 mg/dL (ref 0.0–1.2)
CO2: 19 mmol/L — ABNORMAL LOW (ref 20–29)
Calcium: 9.6 mg/dL (ref 8.7–10.2)
Chloride: 100 mmol/L (ref 96–106)
Creatinine, Ser: 1.14 mg/dL (ref 0.76–1.27)
Globulin, Total: 2.9 g/dL (ref 1.5–4.5)
Glucose: 146 mg/dL — ABNORMAL HIGH (ref 70–99)
Potassium: 4.5 mmol/L (ref 3.5–5.2)
Sodium: 138 mmol/L (ref 134–144)
Total Protein: 7.5 g/dL (ref 6.0–8.5)
eGFR: 75 mL/min/{1.73_m2} (ref >59)

## 2023-07-22 LAB — CBC WITH DIFFERENTIAL/PLATELET
Basophils Absolute: 0.1 10*3/uL (ref 0.0–0.2)
Basos: 1 %
EOS (ABSOLUTE): 0.1 10*3/uL (ref 0.0–0.4)
Eos: 2 %
Hematocrit: 49.2 % (ref 37.5–51.0)
Hemoglobin: 16.8 g/dL (ref 13.0–17.7)
Immature Grans (Abs): 0 10*3/uL (ref 0.0–0.1)
Immature Granulocytes: 0 %
Lymphocytes Absolute: 1.5 10*3/uL (ref 0.7–3.1)
Lymphs: 25 %
MCH: 30.3 pg (ref 26.6–33.0)
MCHC: 34.1 g/dL (ref 31.5–35.7)
MCV: 89 fL (ref 79–97)
Monocytes Absolute: 0.5 10*3/uL (ref 0.1–0.9)
Monocytes: 8 %
Neutrophils Absolute: 3.8 10*3/uL (ref 1.4–7.0)
Neutrophils: 63 %
Platelets: 179 10*3/uL (ref 150–450)
RBC: 5.55 x10E6/uL (ref 4.14–5.80)
RDW: 13.2 % (ref 11.6–15.4)
WBC: 6 10*3/uL (ref 3.4–10.8)

## 2023-07-22 LAB — LIPID PANEL
Chol/HDL Ratio: 8 ratio — ABNORMAL HIGH (ref 0.0–5.0)
Cholesterol, Total: 225 mg/dL — ABNORMAL HIGH (ref 100–199)
HDL: 28 mg/dL — ABNORMAL LOW (ref >39)
LDL Chol Calc (NIH): 122 mg/dL — ABNORMAL HIGH (ref 0–99)
Triglycerides: 420 mg/dL — ABNORMAL HIGH (ref 0–149)
VLDL Cholesterol Cal: 75 mg/dL — ABNORMAL HIGH (ref 5–40)

## 2023-07-22 LAB — PSA: Prostate Specific Ag, Serum: 0.3 ng/mL (ref 0.0–4.0)

## 2023-07-22 LAB — HEMOGLOBIN A1C
Est. average glucose Bld gHb Est-mCnc: 163 mg/dL
Hgb A1c MFr Bld: 7.3 % — ABNORMAL HIGH (ref 4.8–5.6)

## 2023-07-22 LAB — TSH: TSH: 1.34 u[IU]/mL (ref 0.450–4.500)

## 2023-07-26 ENCOUNTER — Ambulatory Visit: Payer: Self-pay | Admitting: Physician Assistant

## 2023-08-12 ENCOUNTER — Other Ambulatory Visit: Payer: Self-pay | Admitting: Physician Assistant

## 2023-08-12 DIAGNOSIS — G43809 Other migraine, not intractable, without status migrainosus: Secondary | ICD-10-CM

## 2023-08-12 DIAGNOSIS — I152 Hypertension secondary to endocrine disorders: Secondary | ICD-10-CM

## 2023-08-12 DIAGNOSIS — E1169 Type 2 diabetes mellitus with other specified complication: Secondary | ICD-10-CM

## 2023-08-24 ENCOUNTER — Other Ambulatory Visit: Payer: Self-pay | Admitting: Family Medicine

## 2023-08-24 DIAGNOSIS — N529 Male erectile dysfunction, unspecified: Secondary | ICD-10-CM

## 2023-08-30 ENCOUNTER — Ambulatory Visit: Admitting: Physician Assistant

## 2023-08-30 ENCOUNTER — Encounter: Payer: Self-pay | Admitting: Physician Assistant

## 2023-08-30 VITALS — BP 130/90 | HR 64 | Resp 16 | Ht 69.0 in | Wt 212.0 lb

## 2023-08-30 DIAGNOSIS — K90829 Short bowel syndrome, unspecified: Secondary | ICD-10-CM

## 2023-08-30 DIAGNOSIS — E8881 Metabolic syndrome: Secondary | ICD-10-CM

## 2023-08-30 DIAGNOSIS — E1169 Type 2 diabetes mellitus with other specified complication: Secondary | ICD-10-CM

## 2023-08-30 DIAGNOSIS — I152 Hypertension secondary to endocrine disorders: Secondary | ICD-10-CM

## 2023-08-30 DIAGNOSIS — I491 Atrial premature depolarization: Secondary | ICD-10-CM

## 2023-08-30 DIAGNOSIS — E1142 Type 2 diabetes mellitus with diabetic polyneuropathy: Secondary | ICD-10-CM

## 2023-08-30 DIAGNOSIS — I2510 Atherosclerotic heart disease of native coronary artery without angina pectoris: Secondary | ICD-10-CM

## 2023-08-30 DIAGNOSIS — R1319 Other dysphagia: Secondary | ICD-10-CM

## 2023-08-30 DIAGNOSIS — E1159 Type 2 diabetes mellitus with other circulatory complications: Secondary | ICD-10-CM

## 2023-08-30 DIAGNOSIS — G43809 Other migraine, not intractable, without status migrainosus: Secondary | ICD-10-CM | POA: Diagnosis not present

## 2023-08-30 DIAGNOSIS — E66811 Obesity, class 1: Secondary | ICD-10-CM

## 2023-08-30 DIAGNOSIS — F4323 Adjustment disorder with mixed anxiety and depressed mood: Secondary | ICD-10-CM

## 2023-08-30 DIAGNOSIS — I509 Heart failure, unspecified: Secondary | ICD-10-CM

## 2023-08-30 DIAGNOSIS — E785 Hyperlipidemia, unspecified: Secondary | ICD-10-CM

## 2023-08-30 MED ORDER — BUTALBITAL-APAP-CAFFEINE 50-325-40 MG PO TABS: 1.0000 | ORAL_TABLET | Freq: Two times a day (BID) | ORAL | 0 refills | Status: DC | PRN

## 2023-08-30 MED ORDER — GLIPIZIDE 5 MG PO TABS: 5.0000 mg | ORAL_TABLET | Freq: Two times a day (BID) | ORAL | 3 refills | Status: DC
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
## 2023-08-31 ENCOUNTER — Telehealth: Payer: Self-pay

## 2023-08-31 NOTE — Progress Notes (Signed)
 Care Guide Pharmacy Note  08/31/2023 Name: Latwan Luchsinger MRN: [id number] DOB: 09-06-1965  Referred By: Ostwalt, Janna, PA-C Reason for referral: Complex Care Management and Call Attempt #1 (Unsuccessful initial outreach to schedule with PHARM D- Asajah)   Hunner Garcon is a 58 y.o. year old male who is a primary care patient of Ostwalt, Janna, PA-C.  Jonavan Levern Hiebert was referred to the pharmacist for assistance related to: DMII  An unsuccessful telephone outreach was attempted today to contact the patient who was referred to the pharmacy team for assistance with Disease management. Additional attempts will be made to contact the patient.  Leotis Rase [facility], [facility] Guide  Direct Dial: [phone]  Fax [phone]

## 2023-09-04 NOTE — Progress Notes (Signed)
 Care Guide Pharmacy Note  09/04/2023 Name: Robert Franklin MRN: [id number] DOB: August 10, 1965  Referred By: Ostwalt, Janna, PA-C Reason for referral: Complex Care Management, Call Attempt #1 (Unsuccessful initial outreach to schedule with PHARM D- Asajah), and Call Attempt #2 (Successful initial outreach scheduled with Pharm D- Allyson)   Robert Franklin is a 58 y.o. year old male who is a primary care patient of Ostwalt, Janna, PA-C.  Robert Franklin was referred to the pharmacist for assistance related to: DMII  Successful contact was made with the patient to discuss pharmacy services including being ready for the pharmacist to call at least 5 minutes before the scheduled appointment time and to have medication bottles and any blood pressure readings ready for review. The patient agreed to meet with the pharmacist via telephone visit on (date/time). 09/13/23 @ 2 pm.  Robert Franklin, [facility] Guide  Direct Dial: [phone]  Fax [phone]

## 2023-09-13 ENCOUNTER — Other Ambulatory Visit (INDEPENDENT_AMBULATORY_CARE_PROVIDER_SITE_OTHER)

## 2023-09-13 DIAGNOSIS — E1159 Type 2 diabetes mellitus with other circulatory complications: Secondary | ICD-10-CM

## 2023-09-13 DIAGNOSIS — E785 Hyperlipidemia, unspecified: Secondary | ICD-10-CM

## 2023-09-13 DIAGNOSIS — I152 Hypertension secondary to endocrine disorders: Secondary | ICD-10-CM

## 2023-09-13 DIAGNOSIS — Z7984 Long term (current) use of oral hypoglycemic drugs: Secondary | ICD-10-CM

## 2023-09-13 DIAGNOSIS — E1169 Type 2 diabetes mellitus with other specified complication: Secondary | ICD-10-CM

## 2023-09-13 DIAGNOSIS — E1142 Type 2 diabetes mellitus with diabetic polyneuropathy: Secondary | ICD-10-CM

## 2023-09-13 NOTE — Progress Notes (Signed)
 S:     Chief Complaint  Patient presents with   Diabetes    Reason for visit: ?  Robert Franklin is a 58 y.o. male with a history of diabetes (type 2), who presents today for an initial diabetes pharmacotherapy visit.? Pertinent PMH also includes CAD, HTN, migraine, GERD, HLD, depression, CHF, obesity, and anxiety.   Care Team: Primary Care Provider: Ostwalt, Janna, PA-C  At last visit with PCP on 08/30/23, A1c was increased at 7.3%. Glipizide  5 mg BID was initiated.   Today, patient denies any issues with initiation of glipizide . He reports taking this medication without regards to food.  Current diabetes medications include: pioglitazone  30 mg daily (not taking), Farxiga  10 mg daily, glipizide  5 mg BID  Previous diabetes medications include: metformin  (drops BG) Current hypertension medications include: irbesartan  150 mg daily, metoprolol  succinate 50 mg daily, furosemide  20 mg daily (not needing) Current hyperlipidemia medications include: Vascepa  1g 2 capsules BID  Patient reports adherence to taking all medications as prescribed. He separates out his medications based on AM and PM administration.  Have you been experiencing any side effects to the medications prescribed? no Do you have any problems obtaining medications due to transportation or finances? no Insurance coverage: BCBS  Patient denies hypoglycemic events.  Patient reports nocturia (nighttime urination), but this has improved Patient reports neuropathy (nerve pain), but this has improved.  Patient denies visual changes. Patient denies self foot exams.   Patient reported dietary habits: Eats 1-2 meals/day Snacks: nuts Drinks: water, coffee *working to cut out red meat and carbs. Increasing fish intake   Patient-reported exercise habits: stays active at his job (15 miles of walking/day at work)  Patient denies history of pancreatitis or personal or family history of thyroid  cancer.  DM Prevention:   Statin: Not taking History of albuminuria? no, last UACR on 02/14/22 = 19 mg/g Last foot exam: No foot exam found Tobacco Use:   Tobacco Use: Medium Risk (08/30/2023)   Patient History    Smoking Tobacco Use: Former    Smokeless Tobacco Use: Never    Passive Exposure: Not on file   Hypertension:  Patient has a validated, automated, upper arm home BP cuff Current blood pressure readings readings: 137/84, 150/90  Patient denies hypertensive symptoms including chest pain, shortness of breath  O:     Observed patterns:  Vitals:  Wt Readings from Last 3 Encounters:  08/30/23 212 lb (96.2 kg)  07/21/23 206 lb (93.4 kg)  06/12/23 209 lb (94.8 kg)   BP Readings from Last 3 Encounters:  08/30/23 (!) 130/90  07/21/23 (!) 136/98  06/21/23 (!) 149/91   Pulse Readings from Last 3 Encounters:  08/30/23 64  07/21/23 83  06/21/23 75     Labs:?  Lab Results  Component Value Date   HGBA1C 7.3 (H) 07/21/2023   HGBA1C 6.7 (H) 06/15/2022   HGBA1C 6.9 (H) 10/12/2021   GLUCOSE 146 (H) 07/21/2023   MICRALBCREAT 19 02/14/2022   CREATININE 1.14 07/21/2023   CREATININE 0.96 06/12/2023   CREATININE 1.08 05/08/2023    Lab Results  Component Value Date   CHOL 225 (H) 07/21/2023   LDLCALC 122 (H) 07/21/2023   LDLCALC 97 06/12/2023   LDLCALC 76 12/09/2022   LDLDIRECT 46 07/05/2018   HDL 28 (L) 07/21/2023   TRIG 420 (H) 07/21/2023   TRIG 464 (H) 06/12/2023   TRIG 429 (H) 12/09/2022   ALT 24 07/21/2023   ALT 26 06/12/2023   AST 26 07/21/2023  AST 29 06/12/2023      Chemistry      Component Value Date/Time   NA 138 07/21/2023 1214   NA 135 (L) 01/29/2013 0111   K 4.5 07/21/2023 1214   K 4.1 01/29/2013 0111   CL 100 07/21/2023 1214   CL 103 01/29/2013 0111   CO2 19 (L) 07/21/2023 1214   CO2 28 01/29/2013 0111   BUN 24 07/21/2023 1214   BUN 25 (H) 01/29/2013 0111   CREATININE 1.14 07/21/2023 1214   CREATININE 1.01 01/29/2013 0111   GLU 80 10/07/2013 0000       Component Value Date/Time   CALCIUM  9.6 07/21/2023 1214   CALCIUM  9.2 01/29/2013 0111   ALKPHOS 108 07/21/2023 1214   AST 26 07/21/2023 1214   ALT 24 07/21/2023 1214   BILITOT 0.6 07/21/2023 1214       The 10-year ASCVD risk score (Arnett DK, et al., 2019) is: 25.8%  Lab Results  Component Value Date   MICRALBCREAT 19 02/14/2022    A/P: Diabetes currently uncontrolled with a most recent A1c of 7.3% on 07/21/23, which is up from 6.7% on 06/15/23.  CGM report demonstrates improvement with a GMI of 6.8%. Medication adherence appears appropriate. Given CHF diagnosis and as patient is not currently administering, will discontinue pioglitazone  therapy as this can worsen CHF. Discussed possible benefit of GLP1 therapy (ASCVD, DM, weight management, CKD). Patient would like to think on this to discuss in the future.  -Continued SGLT2-I Farxiga  (dapagliflozin ) 10 mg daily -Discontinue pioglitazone  30 mg daily -Continue glipzide 5 mg BID with meals. Counseled to only administer in conjunction with meal/snack -Patient educated on purpose, proper use, and potential adverse effects of GLP1 therapy.  -Extensively discussed pathophysiology of diabetes, recommended lifestyle interventions, dietary effects on blood sugar control.  -Counseled on s/sx of and management of hypoglycemia.  -Next A1c anticipated 09/2023.   ASCVD risk - secondary prevention in patient with diabetes. Last LDL is 122 mg/dL, not at goal of <29 mg/dL. History of statin intolerance, particularly reports that BG is elevated with previous cholesterol medications. Will address at follow up. -Continue Vascepa  1 g 2 capsules BID -Follow up scheduled with lipid management clinic in 10/2023  Hypertension longstanding currently uncontrolled. Blood pressure goal of <130/80 mmHg. Medication adherence appropriate. Patient has been monitoring BP prior/at the same time as administration of his antihypertensives.  -Encouraged monitoring BP at  least 30 minutes after medication administration 2-3x per week and maintain a log -Continued irbesartan  150 mg daily. -Continued metoprolol  succinate 50 mg daily.  Patient verbalized understanding of treatment plan. Total time patient counseling 45 minutes.  Follow-up:  Pharmacist on 10/12/23 PCP clinic visit on 10/05/23  Peyton CHARLENA Ferries, PharmD Clinical Pharmacist [facility] [phone]

## 2023-10-04 ENCOUNTER — Other Ambulatory Visit: Payer: Self-pay | Admitting: Family Medicine

## 2023-10-04 DIAGNOSIS — E119 Type 2 diabetes mellitus without complications: Secondary | ICD-10-CM

## 2023-10-04 NOTE — Progress Notes (Signed)
 " Established patient visit  Patient: Robert Franklin   DOB: [birth year]   58 y.o. Male  MRN: [id number] Visit Date: 10/05/2023  Today's healthcare provider: Jolynn Spencer, PA-C   Chief Complaint  Patient presents with   Medical Management of Chronic Issues    Pt states pharmacy has denied his Vacepa and [place]. Does states is with a new company and insurance denied it Pt is fasting   Hypertension   Headache    Pt has an appt with neuro in January asking for a refill on migraine medication until he is in to see them.   Subjective     HPI     Medical Management of Chronic Issues    Additional comments: Pt states pharmacy has denied his Vacepa and Libre. Does states is with a new company and insurance denied it Pt is fasting        Headache    Additional comments: Pt has an appt with neuro in January asking for a refill on migraine medication until he is in to see them.      Last edited by Wilfred Hargis RAMAN, CMA on 10/05/2023  3:01 PM.       Discussed the use of AI scribe software for clinical note transcription with the patient, who gave verbal consent to proceed.  History of Present Illness Robert Franklin is a 58 year old male with hypertension and hyperlipidemia who presents with concerns about blood pressure fluctuations and medication management.  He experiences blood pressure fluctuations, with readings from 119 to 144 mmHg, often higher in the evenings after active or stressful days. He uses a home monitor and follows a relaxation routine before measurements. He takes Avapro  150 mg in the morning and metoprolol  50 mg at night.  He has hyperlipidemia and was taking Vascepa , which he has not refilled for two months due to insurance issues. He is concerned about triglyceride levels and has not tried fenofibrate  or Zetia .  He manages diabetes with Farxiga  and glipizide , achieving stable blood sugar levels around 120 mg/dL.  He experiences headaches and has sleep  issues, including waking up at night, which he attributes to sleep apnea. He discontinued CPAP use due to discomfort. He also experiences fatigue.  He has depression and anxiety, for which he was prescribed escitalopram  10 mg. He has reduced usage but keeps it available for stress.  He is physically active, walking six miles a day, and is mindful of his diet, reducing salt intake to manage blood pressure.       08/30/2023    1:23 PM 07/21/2023   11:41 AM 06/15/2022    4:13 PM  Depression screen PHQ 2/9  Decreased Interest 0 0 0  Down, Depressed, Hopeless 0 0 0  PHQ - 2 Score 0 0 0  Altered sleeping 0 1 0  Tired, decreased energy 0 3 1  Change in appetite 0 0 0  Feeling bad or failure about yourself  0 0 0  Trouble concentrating 0 0 0  Moving slowly or fidgety/restless 0 0 0  Suicidal thoughts 0 0 0  PHQ-9 Score 0 4 1  Difficult doing work/chores Not difficult at all Very difficult Somewhat difficult      08/30/2023    1:24 PM 07/21/2023   11:42 AM  GAD 7 : Generalized Anxiety Score  Nervous, Anxious, on Edge 0 2  Control/stop worrying 0 2  Worry too much - different things 0 2  Trouble relaxing 0 0  Restless  0 1  Easily annoyed or irritable 0 2  Afraid - awful might happen 0 1  Total GAD 7 Score 0 10  Anxiety Difficulty Not difficult at all Very difficult    Medications: Outpatient Medications Prior to Visit  Medication Sig   BIOTIN PO Take 10,000 mcg by mouth 2 (two) times daily.   bismuth subsalicylate (PEPTO BISMOL) 262 MG/15ML suspension Take 30 mLs by mouth every 6 (six) hours as needed for indigestion.   Charcoal Activated (ACTIVATED CHARCOAL PO) Take 260 mg by mouth 2 (two) times daily.   CINNAMON PO Take 1,200 mg by mouth daily.   Continuous Glucose Sensor (FREESTYLE LIBRE 3 SENSOR) MISC 1 each by Does not apply route daily. Place 1 sensor on the skin every 14 days. Use to check glucose continuously   dapagliflozin  propanediol (FARXIGA ) 10 MG TABS tablet Take 1  tablet (10 mg total) by mouth daily before breakfast.   escitalopram  (LEXAPRO ) 10 MG tablet TAKE 1 TABLET BY MOUTH EVERY DAY   glipiZIDE  (GLUCOTROL ) 5 MG tablet Take 1 tablet (5 mg total) by mouth 2 (two) times daily before a meal.   ibuprofen  (ADVIL ) 800 MG tablet Take 1 tablet (800 mg total) by mouth every 8 (eight) hours as needed (pain).   irbesartan  (AVAPRO ) 150 MG tablet TAKE 1 TABLET BY MOUTH EVERY DAY   magnesium  gluconate (MAGONATE) 500 MG tablet Take 500 mg by mouth daily.   metoprolol  succinate (TOPROL -XL) 50 MG 24 hr tablet TAKE 1 TABLET BY MOUTH DAILY. TAKE WITH OR IMMEDIATELY FOLLOWING A MEAL.   omeprazole  (PRILOSEC  OTC) 20 MG tablet Take 1 tablet (20 mg total) by mouth daily. (Patient taking differently: Take 20 mg by mouth as needed.)   ondansetron  (ZOFRAN -ODT) 4 MG disintegrating tablet Take 1 tablet (4 mg total) by mouth every 8 (eight) hours as needed for nausea or vomiting.   Potassium 99 MG TABS Take 99 mg by mouth daily.   Probiotic Product (ALIGN) 4 MG CAPS Take 4 mg by mouth daily.   Saw Palmetto, Serenoa repens, (SAW PALMETTO PO) Take 1,400 mg by mouth daily.   tadalafil  (CIALIS ) 10 MG tablet Take 1 tablet (10 mg total) by mouth daily. Ok to take additional 10mg  1 hour prior to sexual activity   tadalafil  (CIALIS ) 20 MG tablet TAKE 1 TABLET (20 MG TOTAL) BY MOUTH EVERY THREE (3) DAYS AS NEEDED FOR ERECTILE DYSFUNCTION.   TURMERIC PO Take 2,600 mg by mouth 2 (two) times daily.   VASCEPA  1 g capsule TAKE 2 CAPSULES BY MOUTH TWICE A DAY   [DISCONTINUED] butalbital -acetaminophen -caffeine  (FIORICET) 50-325-40 MG tablet Take 1 tablet by mouth 2 (two) times daily as needed for headache.   furosemide  (LASIX ) 20 MG tablet TAKE 1 TABLET DAILY AS NEEDED (FOR WEIGHT GAIN OF 3 POUNDS OVERNIGHT OR 5 POUNDS IN A WEEK). (Patient not taking: Reported on 10/05/2023)   No facility-administered medications prior to visit.    Review of Systems  All other systems reviewed and are  negative.  All negative Except see HPI       Objective    BP 129/84   Pulse 67   Resp 14   Ht 5' 9 (1.753 m)   Wt 211 lb (95.7 kg)   SpO2 100%   BMI 31.16 kg/m     Physical Exam Vitals reviewed.  Constitutional:      General: He is not in acute distress.    Appearance: Normal appearance. He is not diaphoretic.  HENT:  Head: Normocephalic and atraumatic.  Eyes:     General: No scleral icterus.    Conjunctiva/sclera: Conjunctivae normal.  Cardiovascular:     Rate and Rhythm: Normal rate and regular rhythm.     Pulses: Normal pulses.     Heart sounds: Normal heart sounds. No murmur heard. Pulmonary:     Effort: Pulmonary effort is normal. No respiratory distress.     Breath sounds: Normal breath sounds. No wheezing or rhonchi.  Musculoskeletal:     Cervical back: Neck supple.     Right lower leg: No edema.     Left lower leg: No edema.  Lymphadenopathy:     Cervical: No cervical adenopathy.  Skin:    General: Skin is warm and dry.     Findings: No rash.  Neurological:     Mental Status: He is alert and oriented to person, place, and time. Mental status is at baseline.  Psychiatric:        Mood and Affect: Mood normal.        Behavior: Behavior normal.      Results for orders placed or performed in visit on 10/05/23  Bladder Scan (Post Void Residual) in office  Result Value Ref Range   Scan Result 32mL         Assessment & Plan Hypertension Chronic Blood pressure fluctuates, higher in the evening. Stress and activity levels complicate management. - Continue Avapro  150 mg daily. - Continue metoprolol  50 mg daily. - Monitor blood pressure regularly. - Reassess in six weeks. Continue low salt diet and exercise Will follow-up  Obstructive sleep apnea Sleep apnea affects health, including blood pressure and fatigue. CPAP intolerance noted. - Consider referral to pulmonology for sleep study reassessment. Pt declined at the moment  Type 2  diabetes mellitus Chronic and stable Management improving with Farxiga  and glipizide . Blood glucose stabilized, fasting levels 80-130 mg/dL. Adheres to dietary modifications and exercise. - Continue Farxiga . - Monitor blood glucose levels regularly. - Continue dietary modifications and exercise. Will follow-up  Hypertriglyceridemia Chronic and uncontrolled Insurance issues led to Vascepa  discontinuation. Triglyceride levels remain a concern. - Start omega-3 fish oil. - Consider starting Zetia . - Attend [facility] 1st. Continue low cholesterol and daily exercise. Will follow-up  Migraine Chronic and unstable Headaches persist, neurology appointment delayed until January. - Prescribe Fioricet until an appointment with neurology. - Contact neurology for cancellation list. Will follow-up  Adjustment disorder with mixed anxiety and depressed mood Reduced escitalopram  use, manages anxiety and depression symptoms. - Continue escitalopram  at a reduced dose as needed. - Monitor mental health symptoms.   Type 2 diabetes mellitus without complication, without long-term current use of insulin (HCC) (Primary)  - CBC with Differential/Platelet - Comprehensive metabolic panel with GFR - Urine Microalbumin w/creat. ratio  Hyperlipidemia associated with type 2 diabetes mellitus (HCC)  - CBC with Differential/Platelet - Comprehensive metabolic panel with GFR - Urine Microalbumin w/creat. ratio - butalbital -acetaminophen -caffeine  (FIORICET) 50-325-40 MG tablet; Take 1 tablet by mouth 2 (two) times daily as needed for headache.  Dispense: 30 tablet; Refill: 0 - Lipid panel  Hypertension associated with diabetes (HCC)  - CBC with Differential/Platelet - Comprehensive metabolic panel with GFR - Urine Microalbumin w/creat. ratio - butalbital -acetaminophen -caffeine  (FIORICET) 50-325-40 MG tablet; Take 1 tablet by mouth 2 (two) times daily as needed for  headache.  Dispense: 30 tablet; Refill: 0  Metabolic syndrome Obesity, Class I, BMI 30.0-34.9 (see actual BMI) Chronic and stable Body mass index is 31.16  kg/m. Weight loss of 5% of pt's current weight via healthy diet and daily exercise encouraged. Will follow-up  Other migraine without status migrainosus, not intractable  - butalbital -acetaminophen -caffeine  (FIORICET) 50-325-40 MG tablet; Take 1 tablet by mouth 2 (two) times daily as needed for headache.  Dispense: 30 tablet; Refill: 0   Orders Placed This Encounter  Procedures   CBC with Differential/Platelet   Comprehensive metabolic panel with GFR    Has the patient fasted?:   Yes   Urine Microalbumin w/creat. ratio   Lipid panel    Has the patient fasted?:   Yes    Return in about 6 weeks (around 11/16/2023) for chronic disease f/u.   The patient was advised to call back or seek an in-person evaluation if the symptoms worsen or if the condition fails to improve as anticipated.  I discussed the assessment and treatment plan with the patient. The patient was provided an opportunity to ask questions and all were answered. The patient agreed with the plan and demonstrated an understanding of the instructions.  I, Jaray Boliver, PA-C have reviewed all documentation for this visit. The documentation on 10/05/2023  for the exam, diagnosis, procedures, and orders are all accurate and complete.  Jolynn Spencer, [facility], MMS [facility] [phone] (phone) [phone] (fax)  [facility] "

## 2023-10-05 ENCOUNTER — Ambulatory Visit (INDEPENDENT_AMBULATORY_CARE_PROVIDER_SITE_OTHER): Admitting: Urology

## 2023-10-05 ENCOUNTER — Ambulatory Visit (INDEPENDENT_AMBULATORY_CARE_PROVIDER_SITE_OTHER): Admitting: Physician Assistant

## 2023-10-05 VITALS — BP 151/94 | HR 65 | Ht 69.0 in | Wt 210.8 lb

## 2023-10-05 VITALS — BP 129/84 | HR 67 | Resp 14 | Ht 69.0 in | Wt 211.0 lb

## 2023-10-05 DIAGNOSIS — N529 Male erectile dysfunction, unspecified: Secondary | ICD-10-CM | POA: Diagnosis not present

## 2023-10-05 DIAGNOSIS — E8881 Metabolic syndrome: Secondary | ICD-10-CM

## 2023-10-05 DIAGNOSIS — I2510 Atherosclerotic heart disease of native coronary artery without angina pectoris: Secondary | ICD-10-CM

## 2023-10-05 DIAGNOSIS — E119 Type 2 diabetes mellitus without complications: Secondary | ICD-10-CM

## 2023-10-05 DIAGNOSIS — R102 Pelvic and perineal pain: Secondary | ICD-10-CM

## 2023-10-05 DIAGNOSIS — E1169 Type 2 diabetes mellitus with other specified complication: Secondary | ICD-10-CM

## 2023-10-05 DIAGNOSIS — F4323 Adjustment disorder with mixed anxiety and depressed mood: Secondary | ICD-10-CM | POA: Diagnosis not present

## 2023-10-05 DIAGNOSIS — I152 Hypertension secondary to endocrine disorders: Secondary | ICD-10-CM

## 2023-10-05 DIAGNOSIS — R1032 Left lower quadrant pain: Secondary | ICD-10-CM

## 2023-10-05 DIAGNOSIS — G43809 Other migraine, not intractable, without status migrainosus: Secondary | ICD-10-CM

## 2023-10-05 DIAGNOSIS — E1159 Type 2 diabetes mellitus with other circulatory complications: Secondary | ICD-10-CM | POA: Diagnosis not present

## 2023-10-05 DIAGNOSIS — N401 Enlarged prostate with lower urinary tract symptoms: Secondary | ICD-10-CM | POA: Diagnosis not present

## 2023-10-05 DIAGNOSIS — G4733 Obstructive sleep apnea (adult) (pediatric): Secondary | ICD-10-CM

## 2023-10-05 DIAGNOSIS — E66811 Obesity, class 1: Secondary | ICD-10-CM

## 2023-10-05 DIAGNOSIS — K90829 Short bowel syndrome, unspecified: Secondary | ICD-10-CM

## 2023-10-05 DIAGNOSIS — Z7984 Long term (current) use of oral hypoglycemic drugs: Secondary | ICD-10-CM

## 2023-10-05 DIAGNOSIS — E785 Hyperlipidemia, unspecified: Secondary | ICD-10-CM

## 2023-10-05 LAB — BLADDER SCAN AMB NON-IMAGING

## 2023-10-05 MED ORDER — BUTALBITAL-APAP-CAFFEINE 50-325-40 MG PO TABS: 1.0000 | ORAL_TABLET | Freq: Two times a day (BID) | ORAL | 0 refills | Status: AC | PRN

## 2023-10-05 MED ORDER — EZETIMIBE 10 MG PO TABS: 10.0000 mg | ORAL_TABLET | Freq: Every day | ORAL | 3 refills | Status: AC

## 2023-10-05 MED ORDER — TADALAFIL 10 MG PO TABS: 10.0000 mg | ORAL_TABLET | Freq: Every day | ORAL | 3 refills | Status: AC

## 2023-10-05 NOTE — Patient Instructions (Addendum)
 Your medication has been sent to [facility]. This is an Therapist, occupational that offers medication at a significantly discounted price. You will need to go to the website ([email or web address]) to sign up for an account, give your address and enter your payment method.   Please call [phone] to schedule your CT scan

## 2023-10-05 NOTE — Progress Notes (Signed)
 10/05/23 11:04 AM   Kendra Levern Rucci October 09, 1965 [id number]  CC: ED, urinary symptoms, PSA screening  HPI: 58 year old male with a number of comorbidities including obesity, CAD, diabetes, sleep apnea not on CPAP referred for the above issues.  He has never issues today, primarily groin and left sided scrotal pain, nocturia 2-3 times overnight, ED, and bothersome urinary symptoms with urgency, frequency, straining.  IPSS score today is 24, quality-of-life mostly dissatisfied.  He has had multiple hernia repairs.  Challenging historian.  PSA normal at 0.3, recent urinalysis completely benign aside from glucosuria, PVR today normal at 30ml.  PMH: Past Medical History:  Diagnosis Date   Coronary artery disease, non-occlusive 02/28/2008   Dr. Fernand @ [facility]: 2 x 40-50% lesions in dominant LCx lesion by cath,  EF 60% with normal wall motion.;  Follow catheterization January 2021 showed stable findings.  LCx lesions likely not stenoses, more normal diameter vessel in setting of diffuse ectasia/aneurysm.   Diabetes mellitus without complication (HCC)    Essential hypertension    GERD (gastroesophageal reflux disease)    Headache    High triglycerides     was previously on treatment, but  he notes that this was stopped   Kidney stones    Moderate obesity 05/29/2014   OSA treated with BiPAP    Short bowel syndrome 1994    Surgical History: Past Surgical History:  Procedure Laterality Date   stomach wrap  1991   GERD   APPENDECTOMY     CARDIAC CATHETERIZATION  02/2008   Dr. Fernand @ [facility]: 2 x 40-50% lesions in a dominant LCx by cath,  EF 60% with normal wall motion.   Cardiolite Nuclear Stress Test  01/2008    heart rate increased from 80-158 BPM. No EKG changes.  there is a moderate sized  reversible defect in the inferior and inferior lateral wall as well as anteroseptal. EF 59%.   CardioNet Holter Monitor  01/2008    sinus rhythm. Rare PVCs in setting was. Current PACs , seen in  singles and pairs. Max heart rate 127, minimum heart rate 57. Average 84.   CHOLECYSTECTOMY     COLON SURGERY     due to gangrenous appendix   COLONOSCOPY WITH PROPOFOL  N/A 04/26/2016   Procedure: COLONOSCOPY WITH PROPOFOL ;  Surgeon: [name], MD;  Location: [facility] ENDOSCOPY;  Service: Endoscopy;  Laterality: N/A;   ESOPHAGOGASTRODUODENOSCOPY (EGD) WITH PROPOFOL  N/A 01/11/2019   Procedure: ESOPHAGOGASTRODUODENOSCOPY (EGD) WITH PROPOFOL ;  Surgeon: Kung Ruel, MD;  Location: [facility] ENDOSCOPY;  Service: Gastroenterology;  Laterality: N/A;   FLEXIBLE SIGMOIDOSCOPY N/A 01/11/2019   Procedure: FLEXIBLE SIGMOIDOSCOPY;  Surgeon: Kung Ruel, MD;  Location: [facility] ENDOSCOPY;  Service: Gastroenterology;  Laterality: N/A;   HERNIA REPAIR  2010   INNER EAR SURGERY     LEFT HEART CATH AND CORONARY ANGIOGRAPHY N/A 02/12/2019   Procedure: LEFT HEART CATH AND CORONARY ANGIOGRAPHY;  Surgeon: Anner Alm ORN, MD;  Location: [facility] INVASIVE CV LAB;  Service: Cardiovascular; Proximal RCA 40%.  Diffuse aneurysmal/ectatic (patulous) dominant LCx with no significant lesions.  Questionable 40 to 50% stenosis-most likely actually just normal diameter with surrounding ectasia/aneurysmal segments.  EF 55 to 60%.  Normal EDP.   NM MYOVIEW  LTD  01/10/2019   Treadmill Myoview : Walk 9:40 min-10.1 METS) EF ~45 to 50%.  INTERMEDIATE RISK STUDY-moderate sized mostly fixed perfusion defect in anterior apical segment.  No reversibility.  Suggests prior infarct without ischemia.   TONSILLECTOMY     TYMPANOSTOMY TUBE PLACEMENT Bilateral  71    Family History: Family History  Problem Relation Age of Onset   Hypertension Mother    Irritable bowel syndrome Mother    Migraines Mother    Lumbar disc disease Mother    Heart attack Mother    Heart attack Maternal Uncle    Heart attack Maternal Grandfather     Social History:  reports that he quit smoking about 17 years ago. His smoking use included cigarettes. He started smoking  about 45 years ago. He has a 28 pack-year smoking history. He has never used smokeless tobacco. He reports current alcohol use. He reports that he does not currently use drugs.  Physical Exam: BP (!) 151/94 (BP Location: Left Arm, Patient Position: Sitting, Cuff Size: Normal)   Pulse 65   Ht 5' 9 (1.753 m)   Wt 210 lb 12.8 oz (95.6 kg)   SpO2 99%   BMI 31.13 kg/m    Constitutional:  Alert and oriented, No acute distress. Cardiovascular: No clubbing, cyanosis, or edema. Respiratory: Normal respiratory effort, no increased work of breathing. GI: Abdomen is soft, nontender, nondistended, no abdominal masses GU: Right testicle atrophic, left testicle 20 cc without masses, no skin lesions, normal phallus  Laboratory Data: See HPI   Assessment & Plan:   58 year old male with a number of other medical issues and urologic complaints today including ED, urinary symptoms, nocturia, pelvic pain.  In terms of his urinary symptoms/nocturia, recommended avoiding bladder irritants, discussed the correlation between untreated sleep apnea and nocturia.  Cialis  changed to 10 mg daily with 10 mg boost dose as needed.  Reassurance provided regarding normal PSA, normal urinalysis, normal PVR.  His pelvic pain seems more related to his multiple prior hernia repairs with mesh, he requested a CT for further evaluation and will contact with those results.  Cialis  changed to 10 mg daily with 10 mg boost dose as needed Call with CT results Consider Flomax  in the future if worsening urinary symptoms  Redell Burnet, MD 10/05/2023  [facility] [address], [address] [place], [place] [address] [phone]

## 2023-10-06 LAB — LIPID PANEL
Chol/HDL Ratio: 7.6 ratio — ABNORMAL HIGH (ref 0.0–5.0)
Cholesterol, Total: 227 mg/dL — ABNORMAL HIGH (ref 100–199)
HDL: 30 mg/dL — ABNORMAL LOW (ref >39)
LDL Chol Calc (NIH): 129 mg/dL — ABNORMAL HIGH (ref 0–99)
Triglycerides: 381 mg/dL — ABNORMAL HIGH (ref 0–149)
VLDL Cholesterol Cal: 68 mg/dL — ABNORMAL HIGH (ref 5–40)

## 2023-10-06 LAB — COMPREHENSIVE METABOLIC PANEL WITH GFR
ALT: 28 IU/L (ref 0–44)
AST: 30 IU/L (ref 0–40)
Albumin: 4.6 g/dL (ref 3.8–4.9)
Alkaline Phosphatase: 88 IU/L (ref 44–121)
BUN/Creatinine Ratio: 23 — ABNORMAL HIGH (ref 9–20)
BUN: 24 mg/dL (ref 6–24)
Bilirubin Total: 0.8 mg/dL (ref 0.0–1.2)
CO2: 24 mmol/L (ref 20–29)
Calcium: 9.7 mg/dL (ref 8.7–10.2)
Chloride: 102 mmol/L (ref 96–106)
Creatinine, Ser: 1.06 mg/dL (ref 0.76–1.27)
Globulin, Total: 3.1 g/dL (ref 1.5–4.5)
Glucose: 102 mg/dL — ABNORMAL HIGH (ref 70–99)
Potassium: 4.8 mmol/L (ref 3.5–5.2)
Sodium: 141 mmol/L (ref 134–144)
Total Protein: 7.7 g/dL (ref 6.0–8.5)
eGFR: 82 mL/min/1.73 (ref >59)

## 2023-10-06 LAB — CBC WITH DIFFERENTIAL/PLATELET
Basophils Absolute: 0.1 x10E3/uL (ref 0.0–0.2)
Basos: 1 %
EOS (ABSOLUTE): 0.1 x10E3/uL (ref 0.0–0.4)
Eos: 2 %
Hematocrit: 52.6 % — ABNORMAL HIGH (ref 37.5–51.0)
Hemoglobin: 17.8 g/dL — ABNORMAL HIGH (ref 13.0–17.7)
Immature Grans (Abs): 0 x10E3/uL (ref 0.0–0.1)
Immature Granulocytes: 0 %
Lymphocytes Absolute: 1.7 x10E3/uL (ref 0.7–3.1)
Lymphs: 25 %
MCH: 30.1 pg (ref 26.6–33.0)
MCHC: 33.8 g/dL (ref 31.5–35.7)
MCV: 89 fL (ref 79–97)
Monocytes Absolute: 0.7 x10E3/uL (ref 0.1–0.9)
Monocytes: 10 %
Neutrophils Absolute: 4.2 x10E3/uL (ref 1.4–7.0)
Neutrophils: 61 %
Platelets: 175 x10E3/uL (ref 150–450)
RBC: 5.91 x10E6/uL — ABNORMAL HIGH (ref 4.14–5.80)
RDW: 12.9 % (ref 11.6–15.4)
WBC: 6.7 x10E3/uL (ref 3.4–10.8)

## 2023-10-06 LAB — MICROALBUMIN / CREATININE URINE RATIO
Creatinine, Urine: 133.9 mg/dL
Microalb/Creat Ratio: 48 mg/g{creat} — ABNORMAL HIGH (ref 0–29)
Microalbumin, Urine: 64.7 ug/mL

## 2023-10-09 ENCOUNTER — Ambulatory Visit: Payer: Self-pay | Admitting: Physician Assistant

## 2023-10-09 ENCOUNTER — Telehealth: Payer: Self-pay

## 2023-10-09 DIAGNOSIS — E1169 Type 2 diabetes mellitus with other specified complication: Secondary | ICD-10-CM

## 2023-10-10 NOTE — Telephone Encounter (Signed)
 Brief Telephone Documentation Spoke with [facility] regarding cost of Freestyle Libre 3+ sensors and Vascepa  on insurance. He is currently under 2 different insurances. Per pharmacy representative, it appears coverage is better under [facility] vs [employer]. Vascepa  is covered under [facility] [employer] for ~$20 for a 90 day supply. PA is required to [facility] sensors. Routed to prior authorization team.  Refill needed for Vascepa . Will route to PCP.   Contacted patient via telephone to communicate this information.   Whitni Pasquini E. Marsh, PharmD Clinical Pharmacist [facility] [phone]

## 2023-10-11 ENCOUNTER — Other Ambulatory Visit (HOSPITAL_COMMUNITY): Payer: Self-pay

## 2023-10-11 ENCOUNTER — Telehealth: Payer: Self-pay

## 2023-10-11 NOTE — Telephone Encounter (Signed)
 Per test claim, Dexcom G7 Sensor is the preferred CGM for both primary and secondary insurance, just wanted to verify that it's ok to do a PA for these instead of Freestyle? Thanks

## 2023-10-12 ENCOUNTER — Other Ambulatory Visit: Payer: Self-pay

## 2023-10-12 ENCOUNTER — Other Ambulatory Visit (INDEPENDENT_AMBULATORY_CARE_PROVIDER_SITE_OTHER)

## 2023-10-12 DIAGNOSIS — E119 Type 2 diabetes mellitus without complications: Secondary | ICD-10-CM

## 2023-10-12 DIAGNOSIS — Z7984 Long term (current) use of oral hypoglycemic drugs: Secondary | ICD-10-CM

## 2023-10-12 MED ORDER — FREESTYLE LIBRE 3 PLUS SENSOR MISC
0 refills | Status: DC
Start: 2023-10-12 — End: 2023-11-10
  Filled 2023-10-12 (×2): qty 2, 30d supply, fill #0

## 2023-10-12 MED ORDER — DAPAGLIFLOZIN PROPANEDIOL 10 MG PO TABS
10.0000 mg | ORAL_TABLET | Freq: Every day | ORAL | 0 refills | Status: DC
  Filled 2023-10-12: qty 30, 30d supply, fill #0

## 2023-10-12 MED ORDER — VASCEPA 1 G PO CAPS: 2.0000 g | ORAL_CAPSULE | Freq: Two times a day (BID) | ORAL | 1 refills | Status: AC

## 2023-10-12 NOTE — Telephone Encounter (Signed)
PA submitted, currently pending.

## 2023-10-12 NOTE — Telephone Encounter (Signed)
 Pharmacy Patient Advocate Encounter   Received notification from Physician's Office that prior authorization for Dexcom G7 Sensor is required/requested.   Insurance verification completed.   The patient is insured through [employer] [place]  .   Per test claim: PA required; PA submitted to above mentioned insurance via Latent Key/confirmation #/EOC [id number] Status is pending

## 2023-10-12 NOTE — Progress Notes (Signed)
 S:     Chief Complaint  Patient presents with   Diabetes    Reason for visit: ?  Robert Franklin is a 58 y.o. male with a history of diabetes (type 2), who presents today for an initial diabetes pharmacotherapy visit.? Pertinent PMH also includes CAD, HTN, migraine, GERD, HLD, depression, CHF, obesity, and anxiety.   Care Team: Primary Care Provider: Ostwalt, Janna, PA-C  At last visit with pharmacist on 09/13/23, CGM showed time in target of 89%. Pioglitazone  was discontinued at that time with concurrent CHF dx.    At last visit with PCP on 10/05/23, patient was started on ezetimibe  10 mg daily.   Today, patient reports issues with obtaining Farxiga  at his pharmacy. It appears that refill is too soon on his wife's insurance, however, he has run out of this medication ~1 week ago.   Current diabetes medications include: Farxiga  10 mg daily, glipizide  5 mg BID  Previous diabetes medications include: metformin  (drops BG), pioglitazone  Current hypertension medications include: irbesartan  150 mg daily, metoprolol  succinate 50 mg daily, furosemide  20 mg daily (not needing) Current hyperlipidemia medications include: Vascepa  1g 2 capsules BID  Patient reports adherence to taking all medications as prescribed, aside from running out of Farxiga  ~1 week ago. He separates out his medications based on AM and PM administration.  Have you been experiencing any side effects to the medications prescribed? no Do you have any problems obtaining medications due to transportation or finances? no Insurance coverage: BCBS  Patient denies hypoglycemic events.  Patient reports nocturia (nighttime urination), but this has improved Patient reports neuropathy (nerve pain), but this has improved.  Patient denies visual changes. Patient denies self foot exams.   Patient reported dietary habits: Eats 1-2 meals/day Snacks: nuts Drinks: water, coffee *working to cut out red meat and carbs.  Increasing fish intake   Patient-reported exercise habits: stays active at his job (15 miles of walking/day at work)  Patient denies history of pancreatitis or personal or family history of thyroid  cancer.  DM Prevention:  Statin: Not taking History of albuminuria? no, last UACR on 02/14/22 = 19 mg/g Last foot exam: No foot exam found Tobacco Use:   Tobacco Use: Medium Risk (10/05/2023)   Patient History    Smoking Tobacco Use: Former    Smokeless Tobacco Use: Never    Passive Exposure: Not on file    O:  Freestyle Libre Report:      Vitals:  Wt Readings from Last 3 Encounters:  10/05/23 211 lb (95.7 kg)  10/05/23 210 lb 12.8 oz (95.6 kg)  08/30/23 212 lb (96.2 kg)   BP Readings from Last 3 Encounters:  10/05/23 129/84  10/05/23 (!) 151/94  08/30/23 (!) 130/90   Pulse Readings from Last 3 Encounters:  10/05/23 67  10/05/23 65  08/30/23 64     Labs:?  Lab Results  Component Value Date   HGBA1C 7.3 (H) 07/21/2023   HGBA1C 6.7 (H) 06/15/2022   HGBA1C 6.9 (H) 10/12/2021   GLUCOSE 102 (H) 10/05/2023   MICRALBCREAT 48 (H) 10/05/2023   MICRALBCREAT 19 02/14/2022   CREATININE 1.06 10/05/2023   CREATININE 1.14 07/21/2023   CREATININE 0.96 06/12/2023    Lab Results  Component Value Date   CHOL 227 (H) 10/05/2023   LDLCALC 129 (H) 10/05/2023   LDLCALC 122 (H) 07/21/2023   LDLCALC 97 06/12/2023   LDLDIRECT 46 07/05/2018   HDL 30 (L) 10/05/2023   TRIG 381 (H) 10/05/2023   TRIG 420 (  H) 07/21/2023   TRIG 464 (H) 06/12/2023   ALT 28 10/05/2023   ALT 24 07/21/2023   AST 30 10/05/2023   AST 26 07/21/2023      Chemistry      Component Value Date/Time   NA 141 10/05/2023 1556   NA 135 (L) 01/29/2013 0111   K 4.8 10/05/2023 1556   K 4.1 01/29/2013 0111   CL 102 10/05/2023 1556   CL 103 01/29/2013 0111   CO2 24 10/05/2023 1556   CO2 28 01/29/2013 0111   BUN 24 10/05/2023 1556   BUN 25 (H) 01/29/2013 0111   CREATININE 1.06 10/05/2023 1556   CREATININE 1.01  01/29/2013 0111   GLU 80 10/07/2013 0000      Component Value Date/Time   CALCIUM  9.7 10/05/2023 1556   CALCIUM  9.2 01/29/2013 0111   ALKPHOS 88 10/05/2023 1556   AST 30 10/05/2023 1556   ALT 28 10/05/2023 1556   BILITOT 0.8 10/05/2023 1556       The 10-year ASCVD risk score (Arnett DK, et al., 2019) is: 24.4%  Lab Results  Component Value Date   MICRALBCREAT 48 (H) 10/05/2023   MICRALBCREAT 19 02/14/2022    A/P: Diabetes currently uncontrolled with a most recent A1c of 7.3% on 07/21/23, which is up from 6.7% on 06/15/23.  CGM report demonstrates improvement with a GMI of 6.8%. Medication adherence appears appropriate, aside from running out of Farxiga  and insurance reports too soon to fill. Awaiting PA response from insurance on Dexcom G7, preferred CGM on insurance. Will send a one month refill of Farxiga  and Freestyle Libre 3+ to [facility] pharmacy to fill on secondary insurance and cash respectively in the interim.  -Continued SGLT2-I Farxiga  (dapagliflozin ) 10 mg daily. Sent one month to [facility] -Continue glipzide 5 mg BID with meals. Counseled to only administer in conjunction with meal/snack -Refilled Freestyle Libre 3+. Refill sent to [facility] -Patient educated on purpose, proper use, and potential adverse effects of GLP1 therapy.  -Extensively discussed pathophysiology of diabetes, recommended lifestyle interventions, dietary effects on blood sugar control.  -Counseled on s/sx of and management of hypoglycemia.  -Next A1c anticipated 09/2023.   ASCVD risk - secondary prevention in patient with diabetes. Last LDL is 122 mg/dL, not at goal of <29 mg/dL. History of statin intolerance, particularly reports that BG is elevated with previous cholesterol medications.  -Continue Vascepa  1 g 2 capsules BID -Continued ezetimibe  10 mg daily -Follow up scheduled with lipid management clinic in 10/2023  Hypertension longstanding currently uncontrolled. Blood pressure goal of <130/80 mmHg.  Medication adherence appropriate. Patient has been monitoring BP prior/at the same time as administration of his antihypertensives.  -Encouraged monitoring BP at least 30 minutes after medication administration 2-3x per week and maintain a log -Continued irbesartan  150 mg daily. -Continued metoprolol  succinate 50 mg daily.  Patient verbalized understanding of treatment plan. Total time patient counseling 20 minutes.  Follow-up:  Pharmacist on 11/10/23 PCP clinic visit on 11/16/23  Peyton CHARLENA Ferries, PharmD Clinical Pharmacist [facility] [phone]

## 2023-10-13 ENCOUNTER — Other Ambulatory Visit: Payer: Self-pay | Admitting: Physician Assistant

## 2023-10-13 DIAGNOSIS — G4486 Cervicogenic headache: Secondary | ICD-10-CM

## 2023-10-13 DIAGNOSIS — M25519 Pain in unspecified shoulder: Secondary | ICD-10-CM

## 2023-10-13 DIAGNOSIS — M542 Cervicalgia: Secondary | ICD-10-CM

## 2023-10-13 NOTE — Telephone Encounter (Signed)
 Still pending insurance decision, checked on PA at 9 am 10/13/2023

## 2023-10-13 NOTE — Telephone Encounter (Signed)
 Rx sent on 10/12/23 in other encounter.   Judeen Geralds E. Marsh, PharmD Clinical Pharmacist [facility] [phone]

## 2023-10-16 NOTE — Telephone Encounter (Signed)
 Additional information has been requested from the patient's insurance in order to proceed with the prior authorization request. Requested information has been sent, or form has been filled out and faxed back to [employer] at [phone]  Phone# [phone]

## 2023-10-17 NOTE — Telephone Encounter (Signed)
 Pharmacy Patient Advocate Encounter  Received notification from [employer] that Prior Authorization for Dexcom G7 sensors has been DENIED.  Full denial letter will be uploaded to the media tab. See denial reason below.   PA #/Case ID/Reference #: [id number]

## 2023-10-20 ENCOUNTER — Ambulatory Visit: Payer: Self-pay | Admitting: Urology

## 2023-10-20 ENCOUNTER — Ambulatory Visit
Admission: RE | Admit: 2023-10-20 | Discharge: 2023-10-20 | Disposition: A | Discharge status: Home / Self Care | Source: Ambulatory Visit | Attending: Urology | Admitting: Urology

## 2023-10-20 DIAGNOSIS — R1032 Left lower quadrant pain: Secondary | ICD-10-CM | POA: Insufficient documentation

## 2023-10-20 MED ORDER — IOHEXOL 300 MG/ML  SOLN
100.0000 mL | Freq: Once | INTRAMUSCULAR | Status: AC | PRN
Start: 2023-10-20 — End: 2023-10-20
  Administered 2023-10-20: 100 mL via INTRAVENOUS

## 2023-10-21 ENCOUNTER — Ambulatory Visit
Admission: RE | Admit: 2023-10-21 | Discharge: 2023-10-21 | Disposition: A | Discharge status: Home / Self Care | Source: Ambulatory Visit | Attending: Emergency Medicine | Admitting: Emergency Medicine

## 2023-10-21 VITALS — BP 179/98 | HR 62 | Temp 97.7°F | Resp 17

## 2023-10-21 DIAGNOSIS — M65949 Unspecified synovitis and tenosynovitis, unspecified hand: Secondary | ICD-10-CM | POA: Diagnosis not present

## 2023-10-21 MED ORDER — KETOROLAC TROMETHAMINE 30 MG/ML IJ SOLN
30.0000 mg | Freq: Once | INTRAMUSCULAR | Status: AC
  Administered 2023-10-21: 30 mg via INTRAMUSCULAR

## 2023-10-21 MED ORDER — PREDNISONE 10 MG (21) PO TBPK: ORAL_TABLET | Freq: Every day | ORAL | 0 refills | Status: DC

## 2023-10-21 MED ORDER — BACLOFEN 5 MG PO TABS: 5.0000 mg | ORAL_TABLET | Freq: Every evening | ORAL | 0 refills | Status: AC | PRN

## 2023-10-21 NOTE — Discharge Instructions (Addendum)
 Today you are evaluated for your finger which could be a flare of your trigger finger versus arthritis pain that can occur after injury  You may give an injection of Toradol  to help reduce inflammation and pain and ideally will start to see some improvement within an hour  Starting tomorrow take prednisone  every morning with food as directed, avoid use of ibuprofen , May use Tylenol , this medicine will temporarily increase your blood sugar, monitor closely  You may continue use of ice and heat over the affected areas in 10 to 15-minute intervals, may be more effective now that medication has been added  You may continue to wrap at night for stability and support  If your symptoms continue to persist or worsen you may follow-up with [facility], who is the specialist, has walk-in urgent care as well, information on front page

## 2023-10-21 NOTE — ED Provider Notes (Signed)
 [name]    CSN: [id number] Arrival date & time: 10/21/23  1238      History   Chief Complaint Chief Complaint  Patient presents with   Hand Problem    Trigger finger swollen from a very old, past injury.Pain all the way to my elbow, elbow is clicking also - Entered by patient    HPI Robert Franklin is a 58 y.o. male.   Patient presents for evaluation of pain and tingling to the right second finger beginning 2 months ago.  Has had similar pain in the past due to injury that occurred several years ago, noting to be trigger finger.  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  Has been attempting to manage at home with wrapping of the finger at nighttime and Tylenol  which has been ineffective.  Endorses symptoms interfering with sleep as finger causing a throbbing sensation and radiating to the left elbow.  Unable to fully extend the finger.  Past Medical History:  Diagnosis Date   Coronary artery disease, non-occlusive 02/28/2008   Dr. Fernand @ [facility]: 2 x 40-50% lesions in dominant LCx lesion by cath,  EF 60% with normal wall motion.;  Follow catheterization January 2021 showed stable findings.  LCx lesions likely not stenoses, more normal diameter vessel in setting of diffuse ectasia/aneurysm.   Diabetes mellitus without complication (HCC)    Essential hypertension    GERD (gastroesophageal reflux disease)    Headache    High triglycerides     was previously on treatment, but  he notes that this was stopped   Kidney stones    Moderate obesity 05/29/2014   OSA treated with BiPAP    Short bowel syndrome 1994    Patient Active Problem List   Diagnosis Date Noted   Hyperlipidemia associated with type 2 diabetes mellitus (HCC) 08/30/2023   Chronic heart failure (HCC) 08/30/2023   Fatigue due to treatment 10/24/2021   Worsening headaches 04/30/2019   Neck pain 04/30/2019   Bilateral occipital neuralgia  04/30/2019   Migraine without aura and without status migrainosus, not intractable 04/30/2019   Vertigo 04/30/2019   Abnormal nuclear stress test 02/07/2019   Atypical angina 02/07/2019   Central adiposity 08/10/2018   DM (diabetes mellitus) (HCC) 07/08/2018   Medication management 01/01/2016   Hypercholesterolemia with hypertriglyceridemia 02/26/2015   Abnormally low high density lipoprotein (HDL) cholesterol with hypertriglyceridemia 02/26/2015   Atrial premature contractions 02/26/2015   Metabolic syndrome 02/26/2015   Rapid heart beat 02/26/2015   Signs and symptoms involving emotional state 05/29/2014   Anxiety 05/29/2014   Adult BMI 30+ 05/29/2014   Clinical depression 05/29/2014   Failure of erection 05/29/2014   Abnormal LFTs 05/29/2014   Blood glucose elevated 05/29/2014   GERD (gastroesophageal reflux disease) 05/29/2014   Combined fat and carbohydrate induced hyperlipemia 05/29/2014   Hypertension associated with diabetes (HCC) 05/29/2014   Headache, migraine 05/29/2014   Obesity, Class I, BMI 30.0-34.9 (see actual BMI) 05/29/2014   Gastroduodenal ulcer 05/29/2014   Apnea, sleep 05/29/2014   Umbilical hernia without obstruction and without gangrene 05/29/2014   Avitaminosis D 09/14/2009   Coronary artery disease, non-occlusive 02/27/2009    Past Surgical History:  Procedure Laterality Date   stomach wrap  1991   GERD   APPENDECTOMY     CARDIAC CATHETERIZATION  02/2008   Dr. Fernand @ [facility]: 2 x 40-50% lesions in a dominant LCx by cath,  EF 60% with  normal wall motion.   Cardiolite Nuclear Stress Test  01/2008    heart rate increased from 80-158 BPM. No EKG changes.  there is a moderate sized  reversible defect in the inferior and inferior lateral wall as well as anteroseptal. EF 59%.   CardioNet Holter Monitor  01/2008    sinus rhythm. Rare PVCs in setting was. Current PACs , seen in singles and pairs. Max heart rate 127, minimum heart rate 57. Average 84.    CHOLECYSTECTOMY     COLON SURGERY     due to gangrenous appendix   COLONOSCOPY WITH PROPOFOL  N/A 04/26/2016   Procedure: COLONOSCOPY WITH PROPOFOL ;  Surgeon: [name], MD;  Location: [facility] ENDOSCOPY;  Service: Endoscopy;  Laterality: N/A;   ESOPHAGOGASTRODUODENOSCOPY (EGD) WITH PROPOFOL  N/A 01/11/2019   Procedure: ESOPHAGOGASTRODUODENOSCOPY (EGD) WITH PROPOFOL ;  Surgeon: Kung Ruel, MD;  Location: [facility] ENDOSCOPY;  Service: Gastroenterology;  Laterality: N/A;   FLEXIBLE SIGMOIDOSCOPY N/A 01/11/2019   Procedure: FLEXIBLE SIGMOIDOSCOPY;  Surgeon: Kung Ruel, MD;  Location: [facility] ENDOSCOPY;  Service: Gastroenterology;  Laterality: N/A;   HERNIA REPAIR  2010   INNER EAR SURGERY     LEFT HEART CATH AND CORONARY ANGIOGRAPHY N/A 02/12/2019   Procedure: LEFT HEART CATH AND CORONARY ANGIOGRAPHY;  Surgeon: Anner Alm ORN, MD;  Location: [facility] INVASIVE CV LAB;  Service: Cardiovascular; Proximal RCA 40%.  Diffuse aneurysmal/ectatic (patulous) dominant LCx with no significant lesions.  Questionable 40 to 50% stenosis-most likely actually just normal diameter with surrounding ectasia/aneurysmal segments.  EF 55 to 60%.  Normal EDP.   NM MYOVIEW  LTD  01/10/2019   Treadmill Myoview : Walk 9:40 min-10.1 METS) EF ~45 to 50%.  INTERMEDIATE RISK STUDY-moderate sized mostly fixed perfusion defect in anterior apical segment.  No reversibility.  Suggests prior infarct without ischemia.   TONSILLECTOMY     TYMPANOSTOMY TUBE PLACEMENT Bilateral 1973       Home Medications    Prior to Admission medications   Medication Sig Start Date End Date Taking? Authorizing Provider  Baclofen 5 MG TABS Take 1 tablet (5 mg total) by mouth at bedtime as needed. 10/21/23  Yes Trelyn Vanderlinde, Shelba SAUNDERS, NP  butalbital -acetaminophen -caffeine  (FIORICET) 50-325-40 MG tablet Take 1 tablet by mouth 2 (two) times daily as needed for headache. 10/05/23  Yes Ostwalt, Janna, PA-C  dapagliflozin  propanediol (FARXIGA ) 10 MG TABS tablet Take 1 tablet  (10 mg total) by mouth daily before breakfast. 10/12/23  Yes Ostwalt, Janna, PA-C  escitalopram  (LEXAPRO ) 10 MG tablet TAKE 1 TABLET BY MOUTH EVERY DAY 08/14/23  Yes Ostwalt, Janna, PA-C  ezetimibe  (ZETIA ) 10 MG tablet Take 1 tablet (10 mg total) by mouth daily. 10/05/23  Yes Ostwalt, Janna, PA-C  furosemide  (LASIX ) 20 MG tablet TAKE 1 TABLET DAILY AS NEEDED (FOR WEIGHT GAIN OF 3 POUNDS OVERNIGHT OR 5 POUNDS IN A WEEK). 03/01/23  Yes Loistine Sober, NP  glipiZIDE  (GLUCOTROL ) 5 MG tablet Take 1 tablet (5 mg total) by mouth 2 (two) times daily before a meal. 08/30/23  Yes Ostwalt, Janna, PA-C  irbesartan  (AVAPRO ) 150 MG tablet TAKE 1 TABLET BY MOUTH EVERY DAY 03/08/23  Yes Anner Alm ORN, MD  magnesium  gluconate (MAGONATE) 500 MG tablet Take 500 mg by mouth daily.   Yes [provider]  metoprolol  succinate (TOPROL -XL) 50 MG 24 hr tablet TAKE 1 TABLET BY MOUTH DAILY. TAKE WITH OR IMMEDIATELY FOLLOWING A MEAL. 10/03/22  Yes Anner Alm ORN, MD  predniSONE  (STERAPRED UNI-PAK 21 TAB) 10 MG (21) TBPK tablet Take by mouth daily.  Take 6 tabs by mouth daily  for 1 days, then 5 tabs for 1 days, then 4 tabs for 1 days, then 3 tabs for 1 days, 2 tabs for 1 days, then 1 tab by mouth daily for 1 days 10/21/23  Yes Doratha Mcswain R, NP  tadalafil  (CIALIS ) 10 MG tablet Take 1 tablet (10 mg total) by mouth daily. Ok to take additional 10mg  1 hour prior to sexual activity 10/05/23  Yes Francisca Redell BROCKS, MD  tadalafil  (CIALIS ) 20 MG tablet TAKE 1 TABLET (20 MG TOTAL) BY MOUTH EVERY THREE (3) DAYS AS NEEDED FOR ERECTILE DYSFUNCTION. 08/24/23  Yes Ostwalt, Janna, PA-C  VASCEPA  1 g capsule Take 2 capsules (2 g total) by mouth 2 (two) times daily. 10/12/23  Yes Ostwalt, Janna, PA-C  BIOTIN PO Take 10,000 mcg by mouth 2 (two) times daily.    [provider]  bismuth subsalicylate (PEPTO BISMOL) 262 MG/15ML suspension Take 30 mLs by mouth every 6 (six) hours as needed for indigestion.    [provider]   Charcoal Activated (ACTIVATED CHARCOAL PO) Take 260 mg by mouth 2 (two) times daily.    [provider]  CINNAMON PO Take 1,200 mg by mouth daily.    [provider]  Continuous Glucose Sensor (FREESTYLE LIBRE 3 PLUS SENSOR) MISC Change sensor every 15 days. 10/12/23   Ostwalt, Janna, PA-C  Continuous Glucose Sensor (FREESTYLE LIBRE 3 SENSOR) MISC 1 each by Does not apply route daily. Place 1 sensor on the skin every 14 days. Use to check glucose continuously 06/27/23   Donzella Lauraine SAILOR, DO  ibuprofen  (ADVIL ) 800 MG tablet Take 1 tablet (800 mg total) by mouth every 8 (eight) hours as needed (pain). 01/25/22   Vonna Sharlet POUR, MD  omeprazole  (PRILOSEC  OTC) 20 MG tablet Take 1 tablet (20 mg total) by mouth daily. 05/14/15   Bertrum Charlie CROME, MD  ondansetron  (ZOFRAN -ODT) 4 MG disintegrating tablet Take 1 tablet (4 mg total) by mouth every 8 (eight) hours as needed for nausea or vomiting. 02/09/22   Corlis Burnard DEL, NP  Potassium 99 MG TABS Take 99 mg by mouth daily.    [provider]  Probiotic Product (ALIGN) 4 MG CAPS Take 4 mg by mouth daily.    [provider]  Saw Palmetto, Serenoa repens, (SAW PALMETTO PO) Take 1,400 mg by mouth daily.    [provider]  TURMERIC PO Take 2,600 mg by mouth 2 (two) times daily.    [provider]    Family History Family History  Problem Relation Age of Onset   Hypertension Mother    Irritable bowel syndrome Mother    Migraines Mother    Lumbar disc disease Mother    Heart attack Mother    Heart attack Maternal Uncle    Heart attack Maternal Grandfather     Social History Social History   Tobacco Use   Smoking status: Former    Current packs/day: 0.00    Average packs/day: 1 pack/day for 28.0 years (28.0 ttl pk-yrs)    Types: Cigarettes    Start date: 12/30/1977    Quit date: 12/30/2005    Years since quitting: 17.8   Smokeless tobacco: Never  Vaping Use   Vaping status: Never Used  Substance  Use Topics   Alcohol use: Yes    Comment: occ   Drug use: Not Currently     Allergies   Patient has no known allergies.   Review of Systems  Review of Systems   Physical Exam Triage Vital Signs ED Triage Vitals  Encounter Vitals Group     BP 10/21/23 1248 (!) 179/98     Girls Systolic BP Percentile --      Girls Diastolic BP Percentile --      Boys Systolic BP Percentile --      Boys Diastolic BP Percentile --      Pulse Rate 10/21/23 1248 62     Resp 10/21/23 1248 17     Temp 10/21/23 1248 97.7 F (36.5 C)     Temp Source 10/21/23 1248 Oral     SpO2 10/21/23 1248 96 %     Weight --      Height --      Head Circumference --      Peak Flow --      Pain Score 10/21/23 1247 7     Pain Loc --      Pain Education --      Exclude from Growth Chart --    No data found.  Updated Vital Signs BP (!) 179/98 (BP Location: Left Arm)   Pulse 62   Temp 97.7 F (36.5 C) (Oral)   Resp 17   SpO2 96%   Visual Acuity Right Eye Distance:   Left Eye Distance:   Bilateral Distance:    Right Eye Near:   Left Eye Near:    Bilateral Near:     Physical Exam Constitutional:      Appearance: Normal appearance.  Eyes:     Extraocular Movements: Extraocular movements intact.  Pulmonary:     Effort: Pulmonary effort is normal.  Musculoskeletal:     Comments: Tenderness and mild swelling present to the proximal phalanx of the right second finger extending into the metacarpal, no ecchymosis or deformity, limited range of motion unable to fully extend but able to flex, strength 4 out of 5, sensation intact, capillary refill less than 3  Neurological:     Mental Status: He is alert and oriented to person, place, and time. Mental status is at baseline.      UC Treatments / Results  Labs (all labs ordered are listed, but only abnormal results are displayed) Labs Reviewed - No data to display  EKG   Radiology CT ABDOMEN PELVIS W WO CONTRAST Result Date: 10/20/2023 CLINICAL  DATA:  Abdominal pain, acute, nonlocalized. Left lower quadrant abdominal pain. EXAM: CT ABDOMEN AND PELVIS WITHOUT AND WITH CONTRAST TECHNIQUE: Multidetector CT imaging of the abdomen and pelvis was performed following the standard protocol before and following the bolus administration of intravenous contrast. RADIATION DOSE REDUCTION: This exam was performed according to the departmental dose-optimization program which includes automated exposure control, adjustment of the mA and/or kV according to patient size and/or use of iterative reconstruction technique. CONTRAST:  OMNIPAQUE  IOHEXOL  300 MG/ML  SOLN COMPARISON:  MRI enterography from 06/20/2022 and CT scan abdomen and pelvis from 10/13/2018. FINDINGS: Lower chest: There are subpleural atelectatic changes in the visualized lung bases. Redemonstration of single emphysematous bleb in the right lung lower lobe. No overt consolidation. No pleural effusion. The heart is normal in size. No pericardial effusion. Hepatobiliary: The liver is normal in size. Non-cirrhotic configuration. No suspicious mass. No intrahepatic or extrahepatic bile duct dilation. Gallbladder is surgically absent. Pancreas: Unremarkable. No pancreatic ductal dilatation or surrounding inflammatory changes. Spleen: Within normal limits. No focal lesion. Adrenals/Urinary Tract: Adrenal glands are unremarkable. No suspicious renal mass. There are multiple bilateral simple renal cysts  with largest arising from the right kidney lower pole measuring up to 6.3 x 7.4 cm. No hydroureteronephrosis or nephroureterolithiasis. Unremarkable urinary bladder. There is a small sliding hiatal hernia. There is a small diverticulum arising from the second part of duodenum. Patient is status post right hemicolectomy with ileocolonic anastomosis in the right mid abdomen. No disproportionate dilation of the small or large bowel loops. No evidence of abnormal bowel wall thickening or inflammatory changes. There  are multiple diverticula mainly in the left hemi colon, without imaging signs of diverticulitis. Vascular/Lymphatic: No ascites or pneumoperitoneum. No abdominal or pelvic lymphadenopathy, by size criteria. No aneurysmal dilation of the major abdominal arteries. There are moderate peripheral atherosclerotic vascular calcifications of the aorta and its major branches. Reproductive: Normal size prostate. Symmetric seminal vesicles. Other: There are fat containing umbilical and bilateral inguinal hernias. The soft tissues and abdominal wall are otherwise unremarkable. Musculoskeletal: No suspicious osseous lesions. There are mild - moderate multilevel degenerative changes in the visualized spine. IMPRESSION: 1. No acute inflammatory process identified within the abdomen or pelvis. No bowel obstruction. No nephroureterolithiasis or obstructive uropathy. 2. Multiple other nonacute observations, as described above. Aortic Atherosclerosis (ICD10-I70.0). Electronically Signed   By: Ree Molt M.D.   On: 10/20/2023 15:29    Procedures Procedures (including critical care time)  Medications Ordered in UC Medications  ketorolac  (TORADOL ) 30 MG/ML injection 30 mg (30 mg Intramuscular Given 10/21/23 1320)    Initial Impression / Assessment and Plan / UC Course  I have reviewed the triage vital signs and the nursing notes.  Pertinent labs & imaging results that were available during my care of the patient were reviewed by me and considered in my medical decision making (see chart for details).  Tenosynovitis of finger  Presentation consistent with above diagnoses, discussed with patient.,  No new injury therefore deferring imaging, Toradol  IM given and prescribed oral prednisone  for home use, last A1c level 7.3, well-controlled, has libre device implanted advised to monitor, additionally prescribed baclofen as symptoms have been interfering with sleep, recommended supportive care through over-the-counter  medications and nonpharmacological measures, given walker referral to orthopedics if symptoms continue to persist Final Clinical Impressions(s) / UC Diagnoses   Final diagnoses:  Tenosynovitis of finger     Discharge Instructions      Today you are evaluated for your finger which could be a flare of your trigger finger versus arthritis pain that can occur after injury  You may give an injection of Toradol  to help reduce inflammation and pain and ideally will start to see some improvement within an hour  Starting tomorrow take prednisone  every morning with food as directed, avoid use of ibuprofen , May use Tylenol , this medicine will temporarily increase your blood sugar, monitor closely  You may continue use of ice and heat over the affected areas in 10 to 15-minute intervals, may be more effective now that medication has been added  You may continue to wrap at night for stability and support  If your symptoms continue to persist or worsen you may follow-up with [facility], who is the specialist, has walk-in urgent care as well, information on front page   ED Prescriptions     Medication Sig Dispense Auth. Provider   predniSONE  (STERAPRED UNI-PAK 21 TAB) 10 MG (21) TBPK tablet Take by mouth daily. Take 6 tabs by mouth daily  for 1 days, then 5 tabs for 1 days, then 4 tabs for 1 days, then 3 tabs for 1 days, 2 tabs  for 1 days, then 1 tab by mouth daily for 1 days 21 tablet [place] Hankin R, NP   Baclofen 5 MG TABS Take 1 tablet (5 mg total) by mouth at bedtime as needed. 10 tablet Jansen Goodpasture R, NP      PDMP not reviewed this encounter.   Teresa Shelba SAUNDERS, NP 10/21/23 1431

## 2023-10-21 NOTE — ED Triage Notes (Addendum)
  Trigger finger swollen from a very old, past injury. Pain all the way to my elbow, elbow is clicking also - Entered by patient    Right hand  Patient states that he's having pain and tingling in the hand up to his elbow

## 2023-10-24 ENCOUNTER — Ambulatory Visit: Admitting: Dietician

## 2023-10-24 NOTE — Progress Notes (Unsigned)
 Office Visit    Patient Name: Robert Franklin Date of Encounter: 10/24/2023  Primary Care Provider:  Ostwalt, Janna, PA-C Primary Cardiologist:  Alm Clay, MD  Chief Complaint    Hyperlipidemia   Significant Past Medical History   CAD CAC = 1153 (99th percentile) 50-69% stenosis of pRCA, 25-49% in pLAD  OSA  On BiPAP  DM2   HFrecovered EF         No Known Allergies  History of Present Illness    Robert Franklin is a 58 y.o. male patient of Dr Clay, in the office today to discuss options for cholesterol management.  Insurance Carrier:   Pharmacy:     [facility]:      LDL Cholesterol goal:    Current Medications:   ezetimibe  10 mg daily, vascepa  2 gm bid  Previously tried:  rosuvastatin   Family Hx:   maternal male family alld died 45, heart disease; mother MI in 10; cabg; father Dm, blind, amputation; , 2 brothers, both with htn,one with DM  Social Hx: Tobacco: no Alcohol:  no    Diet:  half, some fast food, but more sit fown; hates water, trying to do flavr=or water, ocffee 64 oz [employer] or [employer]  Exercise: walks at work   Adherence Assessment  Do you ever forget to take your medication? [] Yes [] No  Do you ever skip doses due to side effects? [] Yes [] No  Do you have trouble affording your medicines? [] Yes [] No  Are you ever unable to pick up your medication due to transportation difficulties? [] Yes [] No  Do you ever stop taking your medications because you don't believe they are helping? [] Yes [] No  Do you check your weight daily? [] Yes [] No   Adherence strategy: ***  Barriers to obtaining medications: ***     Accessory Clinical Findings   Lab Results  Component Value Date   CHOL 227 (H) 10/05/2023   HDL 30 (L) 10/05/2023   LDLCALC 129 (H) 10/05/2023   LDLDIRECT 46 07/05/2018   TRIG 381 (H) 10/05/2023   CHOLHDL 7.6 (H) 10/05/2023    Lipoprotein (a)  Date/Time Value Ref Range Status  07/05/2018 02:05 PM 11.0 <75.0  nmol/L Final    Comment:    Note:  Values greater than or equal to 75.0 nmol/L may        indicate an independent risk factor for CHD,        but must be evaluated with caution when applied        to non-Caucasian populations due to the        influence of genetic factors on Lp(a) across        ethnicities.     Lab Results  Component Value Date   ALT 28 10/05/2023   AST 30 10/05/2023   ALKPHOS 88 10/05/2023   BILITOT 0.8 10/05/2023   Lab Results  Component Value Date   CREATININE 1.06 10/05/2023   BUN 24 10/05/2023   NA 141 10/05/2023   K 4.8 10/05/2023   CL 102 10/05/2023   CO2 24 10/05/2023   Lab Results  Component Value Date   HGBA1C 7.3 (H) 07/21/2023    Home Medications    Current Outpatient Medications  Medication Sig Dispense Refill   Baclofen 5 MG TABS Take 1 tablet (5 mg total) by mouth at bedtime as needed. 10 tablet 0   BIOTIN PO Take 10,000 mcg by mouth 2 (two) times daily.     bismuth subsalicylate (PEPTO BISMOL) 262  MG/15ML suspension Take 30 mLs by mouth every 6 (six) hours as needed for indigestion.     butalbital -acetaminophen -caffeine  (FIORICET) 50-325-40 MG tablet Take 1 tablet by mouth 2 (two) times daily as needed for headache. 30 tablet 0   Charcoal Activated (ACTIVATED CHARCOAL PO) Take 260 mg by mouth 2 (two) times daily.     CINNAMON PO Take 1,200 mg by mouth daily.     Continuous Glucose Sensor (FREESTYLE LIBRE 3 PLUS SENSOR) MISC Change sensor every 15 days. 2 each 0   Continuous Glucose Sensor (FREESTYLE LIBRE 3 SENSOR) MISC 1 each by Does not apply route daily. Place 1 sensor on the skin every 14 days. Use to check glucose continuously 2 each 12   dapagliflozin  propanediol (FARXIGA ) 10 MG TABS tablet Take 1 tablet (10 mg total) by mouth daily before breakfast. 30 tablet 0   escitalopram  (LEXAPRO ) 10 MG tablet TAKE 1 TABLET BY MOUTH EVERY DAY 90 tablet 1   ezetimibe  (ZETIA ) 10 MG tablet Take 1 tablet (10 mg total) by mouth daily. 90 tablet 3    furosemide  (LASIX ) 20 MG tablet TAKE 1 TABLET DAILY AS NEEDED (FOR WEIGHT GAIN OF 3 POUNDS OVERNIGHT OR 5 POUNDS IN A WEEK). 90 tablet 1   glipiZIDE  (GLUCOTROL ) 5 MG tablet Take 1 tablet (5 mg total) by mouth 2 (two) times daily before a meal. 60 tablet 3   ibuprofen  (ADVIL ) 800 MG tablet Take 1 tablet (800 mg total) by mouth every 8 (eight) hours as needed (pain). 21 tablet 0   irbesartan  (AVAPRO ) 150 MG tablet TAKE 1 TABLET BY MOUTH EVERY DAY 90 tablet 3   magnesium  gluconate (MAGONATE) 500 MG tablet Take 500 mg by mouth daily.     metoprolol  succinate (TOPROL -XL) 50 MG 24 hr tablet TAKE 1 TABLET BY MOUTH DAILY. TAKE WITH OR IMMEDIATELY FOLLOWING A MEAL. 90 tablet 3   omeprazole  (PRILOSEC  OTC) 20 MG tablet Take 1 tablet (20 mg total) by mouth daily. 30 tablet 12   ondansetron  (ZOFRAN -ODT) 4 MG disintegrating tablet Take 1 tablet (4 mg total) by mouth every 8 (eight) hours as needed for nausea or vomiting. 20 tablet 0   Potassium 99 MG TABS Take 99 mg by mouth daily.     predniSONE  (STERAPRED UNI-PAK 21 TAB) 10 MG (21) TBPK tablet Take by mouth daily. Take 6 tabs by mouth daily  for 1 days, then 5 tabs for 1 days, then 4 tabs for 1 days, then 3 tabs for 1 days, 2 tabs for 1 days, then 1 tab by mouth daily for 1 days 21 tablet 0   Probiotic Product (ALIGN) 4 MG CAPS Take 4 mg by mouth daily.     Saw Palmetto, Serenoa repens, (SAW PALMETTO PO) Take 1,400 mg by mouth daily.     tadalafil  (CIALIS ) 10 MG tablet Take 1 tablet (10 mg total) by mouth daily. Ok to take additional 10mg  1 hour prior to sexual activity 90 tablet 3   tadalafil  (CIALIS ) 20 MG tablet TAKE 1 TABLET (20 MG TOTAL) BY MOUTH EVERY THREE (3) DAYS AS NEEDED FOR ERECTILE DYSFUNCTION. 4 tablet 1   TURMERIC PO Take 2,600 mg by mouth 2 (two) times daily.     VASCEPA  1 g capsule Take 2 capsules (2 g total) by mouth 2 (two) times daily. 360 capsule 1   No current facility-administered medications for this visit.     Assessment & Plan     No problem-specific Assessment & Plan notes found for  this encounter.   Zalayah Pizzuto, PharmD CPP [facility] [address]   [place], [place] [address] [phone]  10/24/2023, 4:14 PM

## 2023-10-25 ENCOUNTER — Ambulatory Visit: Attending: Internal Medicine | Admitting: Pharmacist Clinician (PhC)/ Clinical Pharmacy Specialist

## 2023-10-25 DIAGNOSIS — E782 Mixed hyperlipidemia: Secondary | ICD-10-CM | POA: Diagnosis not present

## 2023-10-25 NOTE — Patient Instructions (Signed)
 Your Results:             Your most recent labs Goal  Total Cholesterol 227 < 200  Triglycerides 381 < 150  HDL (happy/good cholesterol) 30 > 40  LDL (lousy/bad cholesterol 129 < 55   Medication changes:  Consider taking Repatha injections every 2 weeks for lowering your cholesterol as well as rosuvastatin  5 mg at least twice weekly.  Lab orders:  We want to repeat labs 2-3 months after starting medications.  We will send you a lab order to remind you once we get closer to that time.    Patient Assistance:    We have copay cards for Repatha that bring the price down to $15 per month   Thank you for choosing [facility]

## 2023-10-26 ENCOUNTER — Encounter: Payer: Self-pay | Admitting: Pharmacist Clinician (PhC)/ Clinical Pharmacy Specialist

## 2023-10-26 NOTE — Assessment & Plan Note (Signed)
 Assessment: Patient with ASCVD not at LDL goal of < 55 Not at TG goal of < 150 Most recent LDL 129, TG 381 on 10/05/23;  Has been compliant with ezetimibe  and Vascepa    Reviewed options for lowering LDL cholesterol, including ezetimibe , PCSK-9 inhibitors, bempedoic acid and inclisiran.  Discussed mechanisms of action, dosing, side effects, potential decreases in LDL cholesterol and costs.  Also reviewed potential options for patient assistance.  Plan: Patient encouraged to start Repatha and low dose rosuvastatin  (2 days per week) He will reach out once he makes a decision about taking medication Repeat labs after:   Lipid Liver function

## 2023-11-01 ENCOUNTER — Ambulatory Visit

## 2023-11-09 NOTE — Progress Notes (Unsigned)
 S:     No chief complaint on file.   Reason for visit: ?  Robert Franklin is a 58 y.o. male with a history of diabetes (type 2), who presents today for an initial diabetes pharmacotherapy visit.? Pertinent PMH also includes CAD, HTN, migraine, GERD, HLD, depression, CHF, obesity, and anxiety.   Care Team: Primary Care Provider: Ostwalt, Janna, PA-C  At last visit with pharmacist on 09/13/23, CGM showed time in target of 89%. Pioglitazone  was discontinued at that time with concurrent CHF dx.    At last visit with PCP on 10/05/23, patient was started on ezetimibe  10 mg daily.   Today, patient reports issues with obtaining Farxiga  at his pharmacy. It appears that refill is too soon on his wife's insurance, however, he has run out of this medication ~1 week ago.   Current diabetes medications include: Farxiga  10 mg daily, glipizide  5 mg BID  Previous diabetes medications include: metformin  (drops BG), pioglitazone  Current hypertension medications include: irbesartan  150 mg daily, metoprolol  succinate 50 mg daily, furosemide  20 mg daily (not needing) Current hyperlipidemia medications include: Vascepa  1g 2 capsules BID  Patient reports adherence to taking all medications as prescribed, aside from running out of Farxiga  ~1 week ago. He separates out his medications based on AM and PM administration.  Have you been experiencing any side effects to the medications prescribed? no Do you have any problems obtaining medications due to transportation or finances? no Insurance coverage: BCBS  Patient denies hypoglycemic events.  Patient reports nocturia (nighttime urination), but this has improved Patient reports neuropathy (nerve pain), but this has improved.  Patient denies visual changes. Patient denies self foot exams.   Patient reported dietary habits: Eats 1-2 meals/day Snacks: nuts Drinks: water, coffee *working to cut out red meat and carbs. Increasing fish intake    Patient-reported exercise habits: stays active at his job (15 miles of walking/day at work)  Patient denies history of pancreatitis or personal or family history of thyroid  cancer.  DM Prevention:  Statin: Not taking History of albuminuria? no, last UACR on 02/14/22 = 19 mg/g Tobacco Use:   Tobacco Use: Medium Risk (10/26/2023)   Patient History    Smoking Tobacco Use: Former    Smokeless Tobacco Use: Never    Passive Exposure: Not on file    O:  Freestyle Libre Report: ***    Vitals:  Wt Readings from Last 3 Encounters:  10/05/23 211 lb (95.7 kg)  10/05/23 210 lb 12.8 oz (95.6 kg)  08/30/23 212 lb (96.2 kg)   BP Readings from Last 3 Encounters:  10/21/23 (!) 179/98  10/05/23 129/84  10/05/23 (!) 151/94   Pulse Readings from Last 3 Encounters:  10/21/23 62  10/05/23 67  10/05/23 65     Labs:?  Lab Results  Component Value Date   HGBA1C 7.3 (H) 07/21/2023   HGBA1C 6.7 (H) 06/15/2022   HGBA1C 6.9 (H) 10/12/2021   GLUCOSE 102 (H) 10/05/2023   MICRALBCREAT 48 (H) 10/05/2023   MICRALBCREAT 19 02/14/2022   CREATININE 1.06 10/05/2023   CREATININE 1.14 07/21/2023   CREATININE 0.96 06/12/2023    Lab Results  Component Value Date   CHOL 227 (H) 10/05/2023   LDLCALC 129 (H) 10/05/2023   LDLCALC 122 (H) 07/21/2023   LDLCALC 97 06/12/2023   LDLDIRECT 46 07/05/2018   HDL 30 (L) 10/05/2023   TRIG 381 (H) 10/05/2023   TRIG 420 (H) 07/21/2023   TRIG 464 (H) 06/12/2023   ALT 28 10/05/2023  ALT 24 07/21/2023   AST 30 10/05/2023   AST 26 07/21/2023      Chemistry      Component Value Date/Time   NA 141 10/05/2023 1556   NA 135 (L) 01/29/2013 0111   K 4.8 10/05/2023 1556   K 4.1 01/29/2013 0111   CL 102 10/05/2023 1556   CL 103 01/29/2013 0111   CO2 24 10/05/2023 1556   CO2 28 01/29/2013 0111   BUN 24 10/05/2023 1556   BUN 25 (H) 01/29/2013 0111   CREATININE 1.06 10/05/2023 1556   CREATININE 1.01 01/29/2013 0111   GLU 80 10/07/2013 0000       Component Value Date/Time   CALCIUM  9.7 10/05/2023 1556   CALCIUM  9.2 01/29/2013 0111   ALKPHOS 88 10/05/2023 1556   AST 30 10/05/2023 1556   ALT 28 10/05/2023 1556   BILITOT 0.8 10/05/2023 1556       The 10-year ASCVD risk score (Arnett DK, et al., 2019) is: 35.6%  Lab Results  Component Value Date   MICRALBCREAT 48 (H) 10/05/2023   MICRALBCREAT 19 02/14/2022    A/P: Diabetes currently uncontrolled with a most recent A1c of 7.3% on 07/21/23, which is up from 6.7% on 06/15/23.  CGM report demonstrates improvement with a GMI of 6.8%. Medication adherence appears appropriate, aside from running out of Farxiga  and insurance reports too soon to fill. Awaiting PA response from insurance on Dexcom G7, preferred CGM on insurance. Will send a one month refill of Farxiga  and Freestyle Libre 3+ to [facility] pharmacy to fill on secondary insurance and cash respectively in the interim.  -Continued SGLT2-I Farxiga  (dapagliflozin ) 10 mg daily. Sent one month to [facility] -Continue glipzide 5 mg BID with meals. Counseled to only administer in conjunction with meal/snack -Refilled Freestyle Libre 3+. Refill sent to [facility] -Patient educated on purpose, proper use, and potential adverse effects of GLP1 therapy.  -Extensively discussed pathophysiology of diabetes, recommended lifestyle interventions, dietary effects on blood sugar control.  -Counseled on s/sx of and management of hypoglycemia.  -Next A1c anticipated 09/2023.   ASCVD risk - secondary prevention in patient with diabetes. Last LDL is 122 mg/dL, not at goal of <44 mg/dL. History of statin intolerance, particularly reports that BG is elevated with previous cholesterol medications.  -Continue Vascepa  1 g 2 capsules BID -Continued ezetimibe  10 mg daily  Hypertension longstanding currently uncontrolled. Blood pressure goal of <130/80 mmHg. Medication adherence appropriate. Patient has been monitoring BP prior/at the same time as administration of his  antihypertensives.  -Encouraged monitoring BP at least 30 minutes after medication administration 2-3x per week and maintain a log -Continued irbesartan  150 mg daily. -Continued metoprolol  succinate 50 mg daily.  Patient verbalized understanding of treatment plan. Total time patient counseling 20 minutes.  Follow-up:  Pharmacist on 11/10/23 PCP clinic visit on 11/16/23  Peyton CHARLENA Ferries, PharmD Clinical Pharmacist [facility] [phone]

## 2023-11-10 ENCOUNTER — Other Ambulatory Visit: Payer: Self-pay

## 2023-11-10 ENCOUNTER — Telehealth: Payer: Self-pay

## 2023-11-10 DIAGNOSIS — E119 Type 2 diabetes mellitus without complications: Secondary | ICD-10-CM

## 2023-11-10 MED ORDER — FREESTYLE LIBRE 3 PLUS SENSOR MISC: 2 refills | Status: AC

## 2023-11-10 NOTE — Progress Notes (Signed)
 Brief Telephone Documentation  Summary of Call: Contacted patient for pharmacy visit today. Discussed option to meet in person next week prior to visit with PCP. Patient would prefer to wait till next week for visit.   Requested refill of Freestyle Libre 3+. Will send to pharmacy. Patient will have to purchase as cash as Dexcom was denied on insurance.   Myosha Cuadras E. Marsh, PharmD Clinical Pharmacist [facility] [phone]

## 2023-11-16 ENCOUNTER — Ambulatory Visit (INDEPENDENT_AMBULATORY_CARE_PROVIDER_SITE_OTHER): Admitting: Physician Assistant

## 2023-11-16 ENCOUNTER — Ambulatory Visit: Payer: Self-pay

## 2023-11-16 VITALS — BP 132/92 | HR 69 | Resp 14 | Ht 69.0 in | Wt 221.4 lb

## 2023-11-16 DIAGNOSIS — G43809 Other migraine, not intractable, without status migrainosus: Secondary | ICD-10-CM

## 2023-11-16 DIAGNOSIS — E785 Hyperlipidemia, unspecified: Secondary | ICD-10-CM

## 2023-11-16 DIAGNOSIS — R635 Abnormal weight gain: Secondary | ICD-10-CM

## 2023-11-16 DIAGNOSIS — E1169 Type 2 diabetes mellitus with other specified complication: Secondary | ICD-10-CM | POA: Diagnosis not present

## 2023-11-16 DIAGNOSIS — I152 Hypertension secondary to endocrine disorders: Secondary | ICD-10-CM

## 2023-11-16 DIAGNOSIS — G4733 Obstructive sleep apnea (adult) (pediatric): Secondary | ICD-10-CM

## 2023-11-16 DIAGNOSIS — E119 Type 2 diabetes mellitus without complications: Secondary | ICD-10-CM | POA: Diagnosis not present

## 2023-11-16 DIAGNOSIS — E1159 Type 2 diabetes mellitus with other circulatory complications: Secondary | ICD-10-CM

## 2023-11-16 DIAGNOSIS — Z7984 Long term (current) use of oral hypoglycemic drugs: Secondary | ICD-10-CM | POA: Diagnosis not present

## 2023-11-16 DIAGNOSIS — R252 Cramp and spasm: Secondary | ICD-10-CM

## 2023-11-16 DIAGNOSIS — E1142 Type 2 diabetes mellitus with diabetic polyneuropathy: Secondary | ICD-10-CM

## 2023-11-16 DIAGNOSIS — M542 Cervicalgia: Secondary | ICD-10-CM

## 2023-11-16 DIAGNOSIS — M79643 Pain in unspecified hand: Secondary | ICD-10-CM

## 2023-11-16 DIAGNOSIS — M25519 Pain in unspecified shoulder: Secondary | ICD-10-CM

## 2023-11-16 DIAGNOSIS — G8929 Other chronic pain: Secondary | ICD-10-CM

## 2023-11-16 MED ORDER — RYBELSUS 3 MG PO TABS: 3.0000 mg | ORAL_TABLET | Freq: Every day | ORAL | 1 refills | Status: AC

## 2023-11-16 NOTE — Progress Notes (Unsigned)
 Established patient visit  Patient: Robert Franklin   DOB: April 26, 1965   58 y.o. Male  MRN: [id number] Visit Date: 11/16/2023  Today's healthcare provider: Jolynn Spencer, PA-C   Chief Complaint  Patient presents with   Medical Management of Chronic Issues    BP- not monitor regularly. Dm- eye exam at [facility]   Subjective     HPI     Medical Management of Chronic Issues    Additional comments: BP- not monitor regularly. Dm- eye exam at [facility]      Last edited by Wilfred Hargis RAMAN, CMA on 11/16/2023  3:24 PM.       Discussed the use of AI scribe software for clinical note transcription with the patient, who gave verbal consent to proceed.  History of Present Illness Robert Franklin is a 58 year old male with hypertension and migraines who presents with elevated blood pressure and daily migraines.  [name] has elevated blood pressure, currently at 132/92 mmHg, which increases with physical activity. He is on Avapro  150 mg and metoprolol  50 mg daily but has not been taking furosemide . Recent weight gain is noted, and a pharmacist suggested medication might be a factor.  He experiences daily migraines and is taking Fiorcet. Previous treatments, including neck injections, were ineffective. He is awaiting a neurology appointment for further evaluation. He reports pinched nerves in his neck.  He experiences nocturnal leg cramps in the calves and toes, described as 'horse cramps', without numbness or tingling. No new medications have been started recently.  He has sleep apnea but is not currently receiving treatment. [facility] and was advised to start injections for triglyceride management. He is taking Zetia  and omega-3 fish oil for triglycerides. He has a family history of polyps and is overdue for a colonoscopy. He has declined the COVID vaccine but has received all necessary vaccines for overseas travel.       08/30/2023    1:23  PM 07/21/2023   11:41 AM 06/15/2022    4:13 PM  Depression screen PHQ 2/9  Decreased Interest 0 0 0  Down, Depressed, Hopeless 0 0 0  PHQ - 2 Score 0 0 0  Altered sleeping 0 1 0  Tired, decreased energy 0 3 1  Change in appetite 0 0 0  Feeling bad or failure about yourself  0 0 0  Trouble concentrating 0 0 0  Moving slowly or fidgety/restless 0 0 0  Suicidal thoughts 0 0 0  PHQ-9 Score 0 4 1  Difficult doing work/chores Not difficult at all Very difficult Somewhat difficult      08/30/2023    1:24 PM 07/21/2023   11:42 AM  GAD 7 : Generalized Anxiety Score  Nervous, Anxious, on Edge 0 2  Control/stop worrying 0 2  Worry too much - different things 0 2  Trouble relaxing 0 0  Restless 0 1  Easily annoyed or irritable 0 2  Afraid - awful might happen 0 1  Total GAD 7 Score 0 10  Anxiety Difficulty Not difficult at all Very difficult    Medications: Outpatient Medications Prior to Visit  Medication Sig   Baclofen 5 MG TABS Take 1 tablet (5 mg total) by mouth at bedtime as needed.   BIOTIN PO Take 10,000 mcg by mouth 2 (two) times daily.   bismuth subsalicylate (PEPTO BISMOL) 262 MG/15ML suspension Take 30 mLs by mouth every 6 (six) hours as needed for indigestion.   butalbital -acetaminophen -caffeine  (FIORICET)  50-325-40 MG tablet Take 1 tablet by mouth 2 (two) times daily as needed for headache.   Charcoal Activated (ACTIVATED CHARCOAL PO) Take 260 mg by mouth 2 (two) times daily.   CINNAMON PO Take 1,200 mg by mouth daily.   Continuous Glucose Sensor (FREESTYLE LIBRE 3 PLUS SENSOR) MISC Change sensor every 15 days.   dapagliflozin  propanediol (FARXIGA ) 10 MG TABS tablet Take 1 tablet (10 mg total) by mouth daily before breakfast.   escitalopram  (LEXAPRO ) 10 MG tablet TAKE 1 TABLET BY MOUTH EVERY DAY (Patient not taking: Reported on 11/16/2023)   ezetimibe  (ZETIA ) 10 MG tablet Take 1 tablet (10 mg total) by mouth daily. (Patient not taking: Reported on 11/16/2023)   furosemide   (LASIX ) 20 MG tablet TAKE 1 TABLET DAILY AS NEEDED (FOR WEIGHT GAIN OF 3 POUNDS OVERNIGHT OR 5 POUNDS IN A WEEK).   glipiZIDE  (GLUCOTROL ) 5 MG tablet Take 1 tablet (5 mg total) by mouth 2 (two) times daily before a meal.   ibuprofen  (ADVIL ) 800 MG tablet Take 1 tablet (800 mg total) by mouth every 8 (eight) hours as needed (pain).   irbesartan  (AVAPRO ) 150 MG tablet TAKE 1 TABLET BY MOUTH EVERY DAY   magnesium  gluconate (MAGONATE) 500 MG tablet Take 500 mg by mouth daily.   metoprolol  succinate (TOPROL -XL) 50 MG 24 hr tablet TAKE 1 TABLET BY MOUTH DAILY. TAKE WITH OR IMMEDIATELY FOLLOWING A MEAL.   omeprazole  (PRILOSEC  OTC) 20 MG tablet Take 1 tablet (20 mg total) by mouth daily.   ondansetron  (ZOFRAN -ODT) 4 MG disintegrating tablet Take 1 tablet (4 mg total) by mouth every 8 (eight) hours as needed for nausea or vomiting.   Potassium 99 MG TABS Take 99 mg by mouth daily.   Probiotic Product (ALIGN) 4 MG CAPS Take 4 mg by mouth daily.   Saw Palmetto, Serenoa repens, (SAW PALMETTO PO) Take 1,400 mg by mouth daily.   tadalafil  (CIALIS ) 10 MG tablet Take 1 tablet (10 mg total) by mouth daily. Ok to take additional 10mg  1 hour prior to sexual activity   tadalafil  (CIALIS ) 20 MG tablet TAKE 1 TABLET (20 MG TOTAL) BY MOUTH EVERY THREE (3) DAYS AS NEEDED FOR ERECTILE DYSFUNCTION.   TURMERIC PO Take 2,600 mg by mouth 2 (two) times daily.   VASCEPA  1 g capsule Take 2 capsules (2 g total) by mouth 2 (two) times daily.   No facility-administered medications prior to visit.    Review of Systems All negative Except see HPI   {Insert previous labs (optional):23779} {See past labs  Heme  Chem  Endocrine  Serology  Results Review (optional):1}   Objective    BP (!) 132/92   Pulse 69   Resp 14   Ht 5' 9 (1.753 m)   Wt 221 lb 6.4 oz (100.4 kg)   SpO2 97%   BMI 32.70 kg/m  {Insert last BP/Wt (optional):23777}{See vitals history (optional):1}   Physical Exam Vitals reviewed.   Constitutional:      General: He is not in acute distress.    Appearance: Normal appearance. He is not diaphoretic.  HENT:     Head: Normocephalic and atraumatic.  Eyes:     General: No scleral icterus.    Conjunctiva/sclera: Conjunctivae normal.  Cardiovascular:     Rate and Rhythm: Normal rate and regular rhythm.     Pulses: Normal pulses.     Heart sounds: Normal heart sounds. No murmur heard. Pulmonary:     Effort: Pulmonary effort is normal. No respiratory  distress.     Breath sounds: Normal breath sounds. No wheezing or rhonchi.  Musculoskeletal:     Cervical back: Neck supple.     Right lower leg: No edema.     Left lower leg: No edema.  Lymphadenopathy:     Cervical: No cervical adenopathy.  Skin:    General: Skin is warm and dry.     Findings: No rash.  Neurological:     Mental Status: He is alert and oriented to person, place, and time. Mental status is at baseline.  Psychiatric:        Mood and Affect: Mood normal.        Behavior: Behavior normal.      No results found for any visits on 11/16/23.      Assessment & Plan Migraine with neck pain Daily migraines possibly linked to cervical radiculopathy. Fiorcet use concerning for drug interactions. Previous injections ineffective. - Use cancellation list for earlier neurology appointment. - Discuss alternative migraine treatments with neurology, including sumatriptan. - Consider referral to pain clinic for injections if needed.  Shoulder pain Possibly related to occupational strain. - Attend orthopedic appointment with Dr. Arlyss for evaluation.  Hand pain Likely due to overuse and strain from physical work. On steroids for inflammation. - Discuss hand pain with Dr. Arlyss during orthopedic appointment. - Consider referral to hand specialist if needed.  Back pain Possibly related to occupational strain. - Consider referral to physical therapy for management. - Discuss physical therapy  options.  Hypertension Blood pressure improved to 132/92 mmHg on irbesartan  and metoprolol . Sodium intake reduced. - Continue irbesartan  150 mg daily. - Continue metoprolol  50 mg daily. - Encourage regular monitoring of blood pressure. - Advise on maintaining reduced sodium intake. - Encourage regular physical activity, aiming for 150 minutes per week.  Hypertriglyceridemia On Zetia  and omega-3 fish oil. Declined injections due to concerns about study duration and stroke risk. - Continue Zetia  and omega-3 fish oil.  Abnormal weight gain Gained 7 pounds. Pharmacist suggested medication might contribute, but specific medication not identified. - Monitor weight and discuss potential medication-related weight gain with pharmacist.  Muscle cramps Experiencing nocturnal leg cramps, possibly related to medication or electrolyte imbalance. Previous labs normal. - Check magnesium  levels.  General Health Maintenance Colonoscopy overdue due to family history of polyps. Declined COVID-19 vaccination. Reports hepatitis and pneumonia vaccinations but lacks documentation. - Schedule colonoscopy. - Obtain official documentation of past vaccinations from [employer].  1. Type 2 diabetes mellitus without complication, without long-term current use of insulin (HCC) (Primary) ***  2. Neck pain *** - Ambulatory referral to Physical Therapy  3. Shoulder pain, unspecified chronicity, unspecified laterality *** - Ambulatory referral to Physical Therapy  4. Muscle cramps *** - Magnesium  - Basic metabolic panel with GFR - CK (Creatine Kinase)  5. Abnormal weight gain ***  6. Hyperlipidemia associated with type 2 diabetes mellitus (HCC) ***  7. Other migraine without status migrainosus, not intractable ***  8. Type 2 diabetes mellitus with diabetic polyneuropathy, without long-term current use of insulin (HCC) ***  9. Hypertension associated with diabetes (HCC) ***  10.  Obstructive sleep apnea syndrome ***  11. Chronic shoulder pain, unspecified laterality ***  12. Pain of hand, unspecified laterality ***  13. Chronic back pain, unspecified back location, unspecified back pain laterality ***   No orders of the defined types were placed in this encounter.   No follow-ups on file.   The patient was advised to call back or seek  an in-person evaluation if the symptoms worsen or if the condition fails to improve as anticipated.  I discussed the assessment and treatment plan with the patient. The patient was provided an opportunity to ask questions and all were answered. The patient agreed with the plan and demonstrated an understanding of the instructions.  I, Jazmin Vensel, PA-C have reviewed all documentation for this visit. The documentation on 11/16/2023  for the exam, diagnosis, procedures, and orders are all accurate and complete.  Jolynn Spencer, [facility], MMS [facility] [phone] (phone) [phone] (fax)  [facility]

## 2023-11-16 NOTE — Progress Notes (Unsigned)
 Will be faxing letter to [facility] to request pt record. Their fax number [phone]

## 2023-11-16 NOTE — Progress Notes (Signed)
 S:     Chief Complaint  Patient presents with   Medication Management    Reason for visit: ?  Robert Franklin is a 58 y.o. male with a history of diabetes (type 2), who presents today for an initial diabetes pharmacotherapy visit.? Pertinent PMH also includes CAD, HTN, migraine, GERD, HLD, depression, CHF, obesity, and anxiety.   Care Team: Primary Care Provider: Ostwalt, Janna, PA-C  Patient was seen in Hypercholesterolemia clinic on 10/25/23. Discussed the option of an injectable medication, such as Repatha at that time. Patient was not interested in an injectable at that time.  Since last visit, he has been on 2+ weeks of steroids and as a result has not been wearing his CGM.   Current diabetes medications include: Farxiga  10 mg daily, glipizide  5 mg BID  Previous diabetes medications include: metformin  (drops BG), pioglitazone  Current hypertension medications include: irbesartan  150 mg daily, metoprolol  succinate 50 mg daily, furosemide  20 mg daily prn Current hyperlipidemia medications include: Vascepa  1g 2 capsules BID, ezetimibe  10 mg daily(not taking)  Patient reports adherence to taking all medications as prescribed. He separates out his medications based on AM and PM administration. Have you been experiencing any side effects to the medications prescribed? no Do you have any problems obtaining medications due to transportation or finances? no Insurance coverage: BCBS  Patient denies hypoglycemic events.  Patient reports nocturia (nighttime urination), but this has improved Patient reports neuropathy (nerve pain), but this has improved.  Patient denies visual changes. Patient denies self foot exams.   Patient reported dietary habits: Eats 1-2 meals/day Snacks: nuts Drinks: water, coffee *working to cut out red meat and carbs. Increasing fish intake   Patient-reported exercise habits: stays active at his job (15 miles of walking/day at work)  Patient denies  history of pancreatitis or personal or family history of thyroid  cancer.  DM Prevention:  Statin: Not taking History of albuminuria? no, last UACR on 10/05/23 = 45 mg/g Tobacco Use:   Tobacco Use: Medium Risk (11/16/2023)   Patient History    Smoking Tobacco Use: Former    Smokeless Tobacco Use: Never    Passive Exposure: Not on file    O:  Freestyle Libre Report:      Vitals:  Wt Readings from Last 3 Encounters:  11/16/23 221 lb 6.4 oz (100.4 kg)  10/05/23 211 lb (95.7 kg)  10/05/23 210 lb 12.8 oz (95.6 kg)   BP Readings from Last 3 Encounters:  11/16/23 (!) 132/92  10/21/23 (!) 179/98  10/05/23 129/84   Pulse Readings from Last 3 Encounters:  11/16/23 69  10/21/23 62  10/05/23 67     Labs:?  Lab Results  Component Value Date   HGBA1C 7.3 (H) 07/21/2023   HGBA1C 6.7 (H) 06/15/2022   HGBA1C 6.9 (H) 10/12/2021   GLUCOSE 102 (H) 10/05/2023   MICRALBCREAT 48 (H) 10/05/2023   MICRALBCREAT 19 02/14/2022   CREATININE 1.06 10/05/2023   CREATININE 1.14 07/21/2023   CREATININE 0.96 06/12/2023    Lab Results  Component Value Date   CHOL 227 (H) 10/05/2023   LDLCALC 129 (H) 10/05/2023   LDLCALC 122 (H) 07/21/2023   LDLCALC 97 06/12/2023   LDLDIRECT 46 07/05/2018   HDL 30 (L) 10/05/2023   TRIG 381 (H) 10/05/2023   TRIG 420 (H) 07/21/2023   TRIG 464 (H) 06/12/2023   ALT 28 10/05/2023   ALT 24 07/21/2023   AST 30 10/05/2023   AST 26 07/21/2023  Chemistry      Component Value Date/Time   NA 141 10/05/2023 1556   NA 135 (L) 01/29/2013 0111   K 4.8 10/05/2023 1556   K 4.1 01/29/2013 0111   CL 102 10/05/2023 1556   CL 103 01/29/2013 0111   CO2 24 10/05/2023 1556   CO2 28 01/29/2013 0111   BUN 24 10/05/2023 1556   BUN 25 (H) 01/29/2013 0111   CREATININE 1.06 10/05/2023 1556   CREATININE 1.01 01/29/2013 0111   GLU 80 10/07/2013 0000      Component Value Date/Time   CALCIUM  9.7 10/05/2023 1556   CALCIUM  9.2 01/29/2013 0111   ALKPHOS 88 10/05/2023  1556   AST 30 10/05/2023 1556   ALT 28 10/05/2023 1556   BILITOT 0.8 10/05/2023 1556       The 10-year ASCVD risk score (Arnett DK, et al., 2019) is: 25.3%  Lab Results  Component Value Date   MICRALBCREAT 48 (H) 10/05/2023   MICRALBCREAT 19 02/14/2022    A/P: Diabetes currently uncontrolled with a most recent A1c of 7.3% on 07/21/23, which is up from 6.7% on 06/15/23.  CGM report demonstrates close to goal with GMI of 7.1%, however, he has not been wearing this for over 2 weeks d/t recent steroid medication usage. Patient is not interested in injectable therapy at this time. Will initiate Rybelsus and discontinue glipizide  to avoid possible hypoglycemia. -Continued SGLT2-I Farxiga  (dapagliflozin ) 10 mg daily.  -Discontinued glipzide 5 mg BID with meals. Counseled to only administer in conjunction with meal/snack -Started Rybelsus 3 mg daily. Provided sample in clinic today -Continue monitoring BG via Freestyle Libre 3 +. Provided sample in clinic today -Patient educated on purpose, proper use, and potential adverse effects of GLP1 therapy.  -Extensively discussed pathophysiology of diabetes, recommended lifestyle interventions, dietary effects on blood sugar control.  -Counseled on s/sx of and management of hypoglycemia.  -Next A1c anticipated 09/2023.   ASCVD risk - secondary prevention in patient with diabetes. Last LDL is 122 mg/dL, not at goal of <44 mg/dL. History of statin intolerance, particularly reports that BG is elevated with previous cholesterol medications. Patient is not currently taking ezetimibe  therapy. Encouraged patient to restart at this time.  -Continue Vascepa  1 g 2 capsules BID -Continued ezetimibe  10 mg daily  Hypertension longstanding currently uncontrolled. Blood pressure goal of <130/80 mmHg. Medication adherence appropriate. Patient has been monitoring BP prior/at the same time as administration of his antihypertensives.  -Encouraged monitoring BP at least 30  minutes after medication administration 2-3x per week and maintain a log -Continued irbesartan  150 mg daily. -Continued metoprolol  succinate 50 mg daily.  Patient verbalized understanding of treatment plan. Total time patient counseling 20 minutes.  Follow-up:  Pharmacist on 12/15/23 PCP clinic visit on 02/16/23  Peyton CHARLENA Ferries, PharmD Clinical Pharmacist [facility] [phone]

## 2023-11-17 LAB — BASIC METABOLIC PANEL WITH GFR
BUN/Creatinine Ratio: 21 — ABNORMAL HIGH (ref 9–20)
BUN: 24 mg/dL (ref 6–24)
CO2: 24 mmol/L (ref 20–29)
Calcium: 10.2 mg/dL (ref 8.7–10.2)
Chloride: 100 mmol/L (ref 96–106)
Creatinine, Ser: 1.12 mg/dL (ref 0.76–1.27)
Glucose: 120 mg/dL — ABNORMAL HIGH (ref 70–99)
Potassium: 4.5 mmol/L (ref 3.5–5.2)
Sodium: 140 mmol/L (ref 134–144)
eGFR: 77 mL/min/1.73 (ref >59)

## 2023-11-17 LAB — MAGNESIUM: Magnesium: 2.1 mg/dL (ref 1.6–2.3)

## 2023-11-17 LAB — CK: Total CK: 97 U/L (ref 41–331)

## 2023-11-18 ENCOUNTER — Ambulatory Visit: Payer: Self-pay | Admitting: Physician Assistant

## 2023-11-21 ENCOUNTER — Other Ambulatory Visit (HOSPITAL_COMMUNITY): Payer: Self-pay

## 2023-11-21 ENCOUNTER — Telehealth: Payer: Self-pay | Admitting: Pharmacy Technician

## 2023-11-21 NOTE — Telephone Encounter (Signed)
 Pharmacy Patient Advocate Encounter   Received notification from Onbase that prior authorization for Rybelsus 3MG  tablets is required/requested.   Insurance verification completed.   The patient is insured through [employer] [place] .   Per test claim: PA required; PA submitted to above mentioned insurance via Latent Key/confirmation #/EOC [id number] Status is pending

## 2023-11-22 ENCOUNTER — Ambulatory Visit: Attending: Student | Admitting: Student

## 2023-11-22 ENCOUNTER — Other Ambulatory Visit (HOSPITAL_COMMUNITY): Payer: Self-pay

## 2023-11-22 ENCOUNTER — Ambulatory Visit

## 2023-11-22 ENCOUNTER — Encounter: Payer: Self-pay | Admitting: Student

## 2023-11-22 ENCOUNTER — Ambulatory Visit (INDEPENDENT_AMBULATORY_CARE_PROVIDER_SITE_OTHER)

## 2023-11-22 VITALS — BP 122/84 | HR 75 | Resp 18 | Ht 69.0 in | Wt 218.0 lb

## 2023-11-22 DIAGNOSIS — I5020 Unspecified systolic (congestive) heart failure: Secondary | ICD-10-CM | POA: Diagnosis not present

## 2023-11-22 DIAGNOSIS — M542 Cervicalgia: Secondary | ICD-10-CM

## 2023-11-22 DIAGNOSIS — M5010 Cervical disc disorder with radiculopathy, unspecified cervical region: Secondary | ICD-10-CM

## 2023-11-22 DIAGNOSIS — R0789 Other chest pain: Secondary | ICD-10-CM

## 2023-11-22 DIAGNOSIS — E785 Hyperlipidemia, unspecified: Secondary | ICD-10-CM

## 2023-11-22 DIAGNOSIS — S63650A Sprain of metacarpophalangeal joint of right index finger, initial encounter: Secondary | ICD-10-CM | POA: Diagnosis not present

## 2023-11-22 DIAGNOSIS — M79641 Pain in right hand: Secondary | ICD-10-CM

## 2023-11-22 DIAGNOSIS — I10 Essential (primary) hypertension: Secondary | ICD-10-CM | POA: Diagnosis not present

## 2023-11-22 DIAGNOSIS — I2510 Atherosclerotic heart disease of native coronary artery without angina pectoris: Secondary | ICD-10-CM | POA: Diagnosis not present

## 2023-11-22 DIAGNOSIS — E781 Pure hyperglyceridemia: Secondary | ICD-10-CM

## 2023-11-22 NOTE — Patient Instructions (Signed)
 Medication Instructions:   Your physician recommends that you continue on your current medications as directed. Please refer to the Current Medication list given to you today.    *If you need a refill on your cardiac medications before your next appointment, please call your pharmacy*  Lab Work:  None ordered at this time   If you have labs (blood work) drawn today and your tests are completely normal, you will receive your results only by:  MyChart Message (if you have MyChart) OR  A paper copy in the mail If you have any lab test that is abnormal or we need to change your treatment, we will call you to review the results.  Testing/Procedures:  None ordered at this time   Referrals:  None ordered at this time   Follow-Up:  At [facility], you and your health needs are our priority.  As part of our continuing mission to provide you with exceptional heart care, our providers are all part of one team.  This team includes your primary Cardiologist (physician) and Advanced Practice Providers or APPs (Physician Assistants and Nurse Practitioners) who all work together to provide you with the care you need, when you need it.  Your next appointment:   5 - 6 month(s)  Provider:    Alm Clay, MD or Barnie Hila, NP    We recommend signing up for the patient portal called MyChart.  Sign up information is provided on this After Visit Summary.  MyChart is used to connect with patients for Virtual Visits (Telemedicine).  Patients are able to view lab/test results, encounter notes, upcoming appointments, etc.  Non-urgent messages can be sent to your provider as well.   To learn more about what you can do with MyChart, go to [email or web address].

## 2023-11-22 NOTE — Progress Notes (Signed)
 Cardiology Clinic Note   Date: 11/22/2023 ID: Trayshawn Durkin, DOB 13-Mar-1965, MRN [id number]  Primary Cardiologist:  Alm Clay, MD  Chief Complaint   Robert Franklin is a 58 y.o. male who presents to the clinic today for routine follow up.   Patient Profile   Robert Franklin is followed by Dr. Clay for the history outlined below.      Past medical history significant for: Nonobstructive CAD. LHC 02/12/2019 (abnormal stress test): Proximal RCA 40%.  Diffuse aneurysmal/ectatic LCx with no significant lesion (but was considered to be 40 to 50% lesions from previous catheterization probably just normal diameter with surrounding ectasia and aneurysmal segments).  Likely false positive stress test.  Recommend adding calcium  channel blocker for distal vessel spasm. Coronary CTA 02/02/2023: Coronary calcium  score 1153 (99th percentile). Moderate proximal RCA stenosis, ectatic proximal LCx, minimal mid LCx, mild proximal LAD.  Chronic HFmrEF with improved LV function.  Limited echo 05/08/2023: EF 50 to 55%.  No RWMA.  Normal global strain.  Normal RV size/function.  No significant valvular abnormalities. Hypertension. Hyperlipidemia. Lipid panel 10/05/2023: LDL 129, HDL 30, TG 381, total 227. Migraines. OSA. GERD. T2DM.  In summary, establish care with Dr. Clay on 02/25/2015.  Patient underwent LHC on 02/28/2008 for stable angina which showed 40% stenosis and 2 lesions of the mid LCx.  Stress test December 2020 was an intermediate risk study showing a moderate perfusion defect in the anterior apical/apical segments on rest and stress consistent with prior infarction and no evidence of reversible ischemia.  He underwent LHC and January 2021 as above. Upon follow up in September 2023 patient's BP was improved secondary to weight loss. Chlorthalidone  was stopped and Toprol  was reduced.    Patient was seen in the clinic on 12/09/2022 for evaluation of elevated BP. Patient  reported BP elevated as high as 165/101. He reported missing a few days of his medications. At the time of his visit he reported continued high BP, headaches, chest discomfort and palpitations x 2 weeks. Headaches were described as different from usual migraines, constant in occipital region, unresponsive to migraine medications and minimally responsive to tylenol . He described stabbing chest pain occurring with minimal exertion and lasting minutes. He reports shortness of breath and increased fatigue particularly after performing physical exertion at work. Patient reported stopping diabetic medications. He stopped rosuvastatin  and chlorthalidone , as it was raising his blood sugar. When his BP increased he restarted chlorthalidone  and has taken metoprolol  and losartan  consistently. The prior 2 weeks his BP was ~120/80. Patient wanted to stop chlorthalidone  secondary to raising his blood sugar. Losartan  changed to irbesartan , and chlorthalidone  changed to hydrochlorothiazide . Coronary CTA showed calcium  score of 1153 with mild to moderate CAD as detailed above. Echo showed moderately decreased LV function with normal RV function, global hypokinesis, normal diastolic parameters, and mild MR.   Patient was seen in the office on 02/13/2023 for follow-up after testing.  He reported continued episodes of chest pain that he felt was coming from chronic back pain.  Chest symptoms improved with heat on his back.  He continued with DOE and fatigue.  He was unable to take HCTZ secondary to an increase in his blood sugar.  He reported more frequent headaches and was encouraged to follow-up with his physician in [place].  He reported uptrending weight.  As needed Lasix  was added for weight gain as well as Farxiga .  Patient agreed to trying low-dose rosuvastatin  and restarting Vascepa .  Repeat echo April 2025 demonstrated  improved LV function as detailed above.   Patient was last seen in the office by me on 06/12/2023 for  follow-up after testing.  He reported 40 pound weight loss since his last visit.  His energy improved and he was not experiencing chest pain or shortness of breath.  He was unable to take rosuvastatin  secondary to an increase in blood sugar.  He continued taking Vascepa .  No medication changes were made.     History of Present Illness    Today, patient is here alone. He reports he changed jobs after his last visit which has decreased his stress significantly. 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 He has a fair amount of stress secondary to taking care of his elderly mother. He recently injured his finger which is causing pain up his arm into his neck and impacting his elbow. He has a follow up with ortho for this today. He has been taking steroids for the inflammation which has caused his sugars to be quite high. Prior to the steroids his blood sugars were in better control. He works closely with a pharm D to help manage his glucose levels. He reports occasional episodes of right-sided chest pain that wraps around to his back relieved with heat. He denies shortness of breath, orthopnea or PND. No lower extremity edema. No palpitations.     ROS: All other systems reviewed and are otherwise negative except as noted in History of Present Illness.  EKGs/Labs Reviewed    EKG Interpretation Date/Time:  Wednesday November 22 2023 09:18:26 EDT Ventricular Rate:  70 PR Interval:  142 QRS Duration:  86 QT Interval:  388 QTC Calculation: 419 R Axis:   -11  Text Interpretation: Normal sinus rhythm Normal ECG When compared with ECG of 09-Dec-2022 15:17, No significant change was found Confirmed by Loistine Sober [phone]) on 11/22/2023 9:24:45 AM   10/05/2023: ALT 28; AST 30 11/16/2023: BUN 24; Creatinine, Ser 1.12; Potassium 4.5; Sodium 140   10/05/2023: Hemoglobin 17.8; WBC 6.7   07/21/2023: TSH 1.340    Physical Exam    VS:  BP 122/84 (BP Location: Left  Arm, Patient Position: Sitting, Cuff Size: Normal)   Pulse 75   Resp 18   Ht 5' 9 (1.753 m)   Wt 218 lb (98.9 kg)   SpO2 95%   BMI 32.19 kg/m  , BMI Body mass index is 32.19 kg/m.  GEN: Well nourished, well developed, in no acute distress. Neck: No JVD or carotid bruits. Cardiac:  RRR.  No murmur. No rubs or gallops.   Respiratory:  Respirations regular and unlabored. Clear to auscultation without rales, wheezing or rhonchi. GI: Soft, nontender, nondistended. Extremities: Radials/DP/PT 2+ and equal bilaterally. No clubbing or cyanosis. No edema  Skin: Warm and dry, no rash. Neuro: Strength intact.  Assessment & Plan   Hypertension BP today 122/84. He has chronic migraines treated with Fioricet.  - Continue to restrict sodium and improve diet.  - Continue Toprol , irbesartan .    Nonobstructive CAD/Non-cardiac chest Pain Nonobstructive CAD per angiography January 2021. History of false negative stress test. Coronary CTA January 2025 with calcium  score of 1153 (99th percentile). Moderate proximal RCA stenosis, ectatic proximal LCx, minimal mid LCx, mild proximal LAD. Patient reports occasional right sided chest pain wrapping around to back relieved with heat. He is active without limitation.  - Continue Toprol .    Chronic HFmrEF with improved LV function Echo April 2025 showed EF 50 to  55%, no RWMA, normal diastolic parameters, normal strain, normal RV size/function. Patient  denies lower extremity edema, DOE, orthopnea or PND. Euvolemic and well compensated on exam.  - Continue irbesartan , as needed Lasix , Farxiga .  - Continue to weigh daily.    Hyperlipidemia/hypertriglyceridemia Lipid panel September 2025 showed LDL 129, TG 381.  Patient intolerant to statins secondary to significant increase in blood sugar.  He is not interested in PCSK9i. Discussed elevation in LDL. He is willing to try Crestor  again. He would like to wait until he has his orthopedic issue addressed to ensure  that he is not going to start another round of steroids. Once his blood sugar normalizes he will start it and contact the office so lipids can be checked in 2 months.  - Continue Vascepa  and Zetia . - Restart Crestor  5 mg daily when ready. Contact office for repeat lipid 2 months after starting.  - Continue lifestyle changes.   Disposition: Return in 6 months or sooner as needed.          Signed, Barnie HERO. Tashiba Timoney, DNP, NP-C

## 2023-11-22 NOTE — Progress Notes (Signed)
 Orthopaedic Surgery New Patient Visit   History of Present Illness: The patient is a 58 y.o. male seen in clinic for neck pain and right index finger pain.  He notes the neck pain started at age 36 after a work injury.  His right index finger pain started after a work injury in 2015 but has been rather symptomatic in the last month.  He also has an orthopedic history significant for bilateral carpal tunnel release as well as right thumb trigger finger release in the past.  In regards to his neck pain, patient states he has migraines as well as subjective numbness in bilateral hands and weakness in his right hand.  Previous treatments have included Botox injections by neurology 4 years ago.  He also has trialed Tylenol , nonsteroidal anti-inflammatories, baclofen and oral prednisone .  For his right index finger, he is also been wearing a compression glove.  Associated symptoms for the right index finger include localized swelling.  Patient reports he is scheduled to see neurology again in January to address his migraines.  Patient presents today with wife.  Occupation: Maintenance lead     Past Medical, Social and Family History: Past Medical History:  Diagnosis Date   Coronary artery disease, non-occlusive 02/28/2008   Dr. Fernand @ [facility]: 2 x 40-50% lesions in dominant LCx lesion by cath,  EF 60% with normal wall motion.;  Follow catheterization January 2021 showed stable findings.  LCx lesions likely not stenoses, more normal diameter vessel in setting of diffuse ectasia/aneurysm.   Diabetes mellitus without complication (HCC)    Essential hypertension    GERD (gastroesophageal reflux disease)    Headache    High triglycerides     was previously on treatment, but  he notes that this was stopped   Kidney stones    Moderate obesity 05/29/2014   OSA treated with BiPAP    Short bowel syndrome 1994   Past Surgical History:  Procedure Laterality Date   stomach wrap  1991   GERD    APPENDECTOMY     CARDIAC CATHETERIZATION  02/2008   Dr. Fernand @ [facility]: 2 x 40-50% lesions in a dominant LCx by cath,  EF 60% with normal wall motion.   Cardiolite Nuclear Stress Test  01/2008    heart rate increased from 80-158 BPM. No EKG changes.  there is a moderate sized  reversible defect in the inferior and inferior lateral wall as well as anteroseptal. EF 59%.   CardioNet Holter Monitor  01/2008    sinus rhythm. Rare PVCs in setting was. Current PACs , seen in singles and pairs. Max heart rate 127, minimum heart rate 57. Average 84.   CHOLECYSTECTOMY     COLON SURGERY     due to gangrenous appendix   COLONOSCOPY WITH PROPOFOL  N/A 04/26/2016   Procedure: COLONOSCOPY WITH PROPOFOL ;  Surgeon: [name], MD;  Location: [facility] ENDOSCOPY;  Service: Endoscopy;  Laterality: N/A;   ESOPHAGOGASTRODUODENOSCOPY (EGD) WITH PROPOFOL  N/A 01/11/2019   Procedure: ESOPHAGOGASTRODUODENOSCOPY (EGD) WITH PROPOFOL ;  Surgeon: Kung Ruel, MD;  Location: [facility] ENDOSCOPY;  Service: Gastroenterology;  Laterality: N/A;   FLEXIBLE SIGMOIDOSCOPY N/A 01/11/2019   Procedure: FLEXIBLE SIGMOIDOSCOPY;  Surgeon: Kung Ruel, MD;  Location: [facility] ENDOSCOPY;  Service: Gastroenterology;  Laterality: N/A;   HERNIA REPAIR  2010   INNER EAR SURGERY     LEFT HEART CATH AND CORONARY ANGIOGRAPHY N/A 02/12/2019   Procedure: LEFT HEART CATH AND CORONARY ANGIOGRAPHY;  Surgeon: Anner Alm ORN, MD;  Location: [facility] INVASIVE CV  LAB;  Service: Cardiovascular; Proximal RCA 40%.  Diffuse aneurysmal/ectatic (patulous) dominant LCx with no significant lesions.  Questionable 40 to 50% stenosis-most likely actually just normal diameter with surrounding ectasia/aneurysmal segments.  EF 55 to 60%.  Normal EDP.   NM MYOVIEW  LTD  01/10/2019   Treadmill Myoview : Walk 9:40 min-10.1 METS) EF ~45 to 50%.  INTERMEDIATE RISK STUDY-moderate sized mostly fixed perfusion defect in anterior apical segment.  No reversibility.  Suggests prior infarct without  ischemia.   TONSILLECTOMY     TYMPANOSTOMY TUBE PLACEMENT Bilateral 1973   No Known Allergies Current Outpatient Medications on File Prior to Visit  Medication Sig Dispense Refill   Baclofen 5 MG TABS Take 1 tablet (5 mg total) by mouth at bedtime as needed. 10 tablet 0   BIOTIN PO Take 10,000 mcg by mouth 2 (two) times daily.     bismuth subsalicylate (PEPTO BISMOL) 262 MG/15ML suspension Take 30 mLs by mouth every 6 (six) hours as needed for indigestion.     butalbital -acetaminophen -caffeine  (FIORICET) 50-325-40 MG tablet Take 1 tablet by mouth 2 (two) times daily as needed for headache. 30 tablet 0   Charcoal Activated (ACTIVATED CHARCOAL PO) Take 260 mg by mouth 2 (two) times daily.     CINNAMON PO Take 1,200 mg by mouth daily.     Continuous Glucose Sensor (FREESTYLE LIBRE 3 PLUS SENSOR) MISC Change sensor every 15 days. 6 each 2   dapagliflozin  propanediol (FARXIGA ) 10 MG TABS tablet Take 1 tablet (10 mg total) by mouth daily before breakfast. 30 tablet 0   escitalopram  (LEXAPRO ) 10 MG tablet TAKE 1 TABLET BY MOUTH EVERY DAY (Patient not taking: Reported on 11/22/2023) 90 tablet 1   ezetimibe  (ZETIA ) 10 MG tablet Take 1 tablet (10 mg total) by mouth daily. 90 tablet 3   furosemide  (LASIX ) 20 MG tablet TAKE 1 TABLET DAILY AS NEEDED (FOR WEIGHT GAIN OF 3 POUNDS OVERNIGHT OR 5 POUNDS IN A WEEK). 90 tablet 1   ibuprofen  (ADVIL ) 800 MG tablet Take 1 tablet (800 mg total) by mouth every 8 (eight) hours as needed (pain). 21 tablet 0   irbesartan  (AVAPRO ) 150 MG tablet TAKE 1 TABLET BY MOUTH EVERY DAY 90 tablet 3   magnesium  gluconate (MAGONATE) 500 MG tablet Take 500 mg by mouth daily.     metoprolol  succinate (TOPROL -XL) 50 MG 24 hr tablet TAKE 1 TABLET BY MOUTH DAILY. TAKE WITH OR IMMEDIATELY FOLLOWING A MEAL. 90 tablet 3   omeprazole  (PRILOSEC  OTC) 20 MG tablet Take 1 tablet (20 mg total) by mouth daily. (Patient taking differently: Take 20 mg by mouth daily as needed.) 30 tablet 12    ondansetron  (ZOFRAN -ODT) 4 MG disintegrating tablet Take 1 tablet (4 mg total) by mouth every 8 (eight) hours as needed for nausea or vomiting. 20 tablet 0   Potassium 99 MG TABS Take 99 mg by mouth daily.     Probiotic Product (ALIGN) 4 MG CAPS Take 4 mg by mouth daily.     Saw Palmetto, Serenoa repens, (SAW PALMETTO PO) Take 1,400 mg by mouth daily.     Semaglutide (RYBELSUS) 3 MG TABS Take 1 tablet (3 mg total) by mouth daily. 30 tablet 1   tadalafil  (CIALIS ) 10 MG tablet Take 1 tablet (10 mg total) by mouth daily. Ok to take additional 10mg  1 hour prior to sexual activity 90 tablet 3   tadalafil  (CIALIS ) 20 MG tablet TAKE 1 TABLET (20 MG TOTAL) BY MOUTH EVERY THREE (3) DAYS AS NEEDED  FOR ERECTILE DYSFUNCTION. 4 tablet 1   TURMERIC PO Take 2,600 mg by mouth 2 (two) times daily.     VASCEPA  1 g capsule Take 2 capsules (2 g total) by mouth 2 (two) times daily. 360 capsule 1   No current facility-administered medications on file prior to visit.   Social History   Tobacco Use   Smoking status: Former    Current packs/day: 0.00    Average packs/day: 1 pack/day for 28.0 years (28.0 ttl pk-yrs)    Types: Cigarettes    Start date: 12/30/1977    Quit date: 12/30/2005    Years since quitting: 17.9   Smokeless tobacco: Never  Vaping Use   Vaping status: Never Used  Substance Use Topics   Alcohol use: Yes    Comment: occ   Drug use: Not Currently      I have reviewed past medical, surgical, social and family history, medications and allergies as documented in the EMR.  Review of Systems - A ROS was performed including pertinent positives and negatives as documented in the HPI.     Physical Exam:  General/Constitutional: NAD Vascular: No edema, swelling or tenderness, except as noted in detailed exam Integumentary: No impressive skin lesions present, except as noted in detailed exam Neuro/Psych: Normal mood and affect, oriented to person, place and time Musculoskeletal: Normal,  except as noted in detailed exam and in HPI   Focused Orthopaedic Examination:  MSK (spine):  - No obvious deformity about the cervical spine.  Mild tenderness along the cervical spine and right sided paraspinal musculature.  -Strength exam      Left  Right Grip strength                5/5  5/5 Interosseus   5/5   4+/5 Wrist extension  5/5  4+/5 Wrist flexion   5/5  5/5 Elbow flexion   5/5  5/5 Deltoid    5/5  5/5    -Sensory exam    Sensation intact to light touch in C5-T1 nerve distributions of bilateral upper extremities    -Spurling: negative   Right Hand/Digit focused exam:  On gross examination of the right upper extremity the skin is intact.  Well-healed incisions over the right wrist and right thumb. Fingers warm and well perfused with 2+ radial pulse.  Sensation intact to the Median, Ulnar and Radial nerve distribution of the hand.    AIN/PIN/Ulnar nerve motor function intact.  Positive tinels at the cubital tunnel, negative tinels at the carpal tunnel.    Full flexion and extension of the fingers without active triggering.  Negative pain about the A1 pulleys.    APB strength 5/5 with no signs of atrophy.    Negative pain about the basilar thumb joint.   Negative CMC grind.  Able to make a composite fist.   Focal swelling over the dorsal index MCP joint.  Tenderness to palpation over the dorsal MCP joint.  Able to actively flex and extend at the MCP and IP joints.  Unable to appreciate obvious extensor tendon subluxation.  No appreciable laxity with ulnar and radial stress at the MCP joint.   IMAGING:  XR Cervical Spine Imaging: X-rays of the cervical spine including 2-views (AP, lateral) obtained today 11/22/2023 at [facility] were reviewed personally by me.  Per my independent interpretation these images show mild degenerative disc disease with endplate changes at multiple levels.  No acute fracture or dislocation.  XR  Right  Hand Imaging: X-rays of the right hand including 2-views (PA, oblique, lateral) obtained today 11/22/2023 at [facility] were reviewed personally by me.  Per my independent interpretation these images show no acute fractures or dislocations.  Mild distal radial ulnar joint arthritis.    Assessment:  Cervical radiculopathy Right index finger MCP swelling Possible right elbow cubital tunnel syndrome History of right wrist carpal tunnel syndrome with carpal tunnel release History of right thumb trigger finger with A1 pulley release  Plan:  Patient was seen and examined in office today. We reviewed patient's history, examination, and imaging in detail. Based on information available for this encounter, patient with multiple complaints today.  He has underlying subjective radicular symptoms of pain and weakness with a history of injury to the cervical spine.  He notes symptoms in bilateral upper extremities have worsened as of late.  We discussed next steps in management.  Offered referral for EMG/NCS to evaluate for pathology.  Patient would like to proceed with this test.  He also has more recent focal right dorsal MCP joint swelling.  Also with positive Tinel's at the elbow suggesting possible cubital tunnel syndrome.  Unable to appreciate exact etiology based on radiographs and examination of the right index finger.  Would like patient to be evaluated by hand specialist for further recommendations and management.  Referral has been placed for this as well.  He may continue conservative treatment measures including anti-inflammatories and sleeve if providing benefit.  He is advised to maintain his appointment for neurology to assess his migraines.  We will plan on seeing patient back in 6 weeks for repeat evaluation.    All questions, concerns and comments were addressed to the best of my ability.  Follow-up: 6 weeks   Arlyss GEANNIE Schneider, DO Orth[facility] [place]   This document was dictated using Dragon voice recognition software. A reasonable attempt at proof reading has been made to minimize errors.

## 2023-11-22 NOTE — Telephone Encounter (Signed)
 Pharmacy Patient Advocate Encounter  Received notification from [employer] [place]  that Prior Authorization for Rybelsus 3MG  tablets has been APPROVED from 10/22/2023 to 11/20/2024. Ran test claim, Copay is $14.99. This test claim was processed through [facility]- copay amounts may vary at other pharmacies due to pharmacy/plan contracts, or as the patient moves through the different stages of their insurance plan.   PA #/Case ID/Reference #: [id number]

## 2023-11-22 NOTE — Patient Instructions (Signed)

## 2023-11-27 ENCOUNTER — Encounter: Payer: Self-pay | Admitting: Radiology

## 2023-12-01 ENCOUNTER — Telehealth: Payer: Self-pay

## 2023-12-01 NOTE — Telephone Encounter (Signed)
 I called and they said they never received the referral. I faxed it again and advised pt

## 2023-12-01 NOTE — Telephone Encounter (Signed)
 Patient called and said that he has not heard nothing from the place he referred him to in [place] for his finger. Can you follow up and see what's going on he ask. He stated his hand and finger is still swelling. CB#[phone]

## 2023-12-15 ENCOUNTER — Other Ambulatory Visit: Payer: Self-pay

## 2023-12-17 NOTE — Therapy (Unsigned)
 OUTPATIENT OCCUPATIONAL THERAPY ORTHO EVALUATION  Patient Name: Robert Franklin MRN: [id number] DOB:[birth year], 58 y.o., male Today's Date: 12/18/2023  PCP: Dineen Diener  REFERRING PROVIDER: DR Ezra  END OF SESSION:  OT End of Session - 12/18/23 1929     Visit Number 1    Number of Visits 8    Date for Recertification  02/12/24    OT Start Time 1650    OT Stop Time 1748    OT Time Calculation (min) 58 min    Activity Tolerance Patient tolerated treatment well    Behavior During Therapy [facility] for tasks assessed/performed          Past Medical History:  Diagnosis Date   Coronary artery disease, non-occlusive 02/28/2008   Dr. Fernand @ [facility]: 2 x 40-50% lesions in dominant LCx lesion by cath,  EF 60% with normal wall motion.;  Follow catheterization January 2021 showed stable findings.  LCx lesions likely not stenoses, more normal diameter vessel in setting of diffuse ectasia/aneurysm.   Diabetes mellitus without complication (HCC)    Essential hypertension    GERD (gastroesophageal reflux disease)    Headache    High triglycerides     was previously on treatment, but  he notes that this was stopped   Kidney stones    Moderate obesity 05/29/2014   OSA treated with BiPAP    Short bowel syndrome 1994   Past Surgical History:  Procedure Laterality Date   stomach wrap  1991   GERD   APPENDECTOMY     CARDIAC CATHETERIZATION  02/2008   Dr. Fernand @ [facility]: 2 x 40-50% lesions in a dominant LCx by cath,  EF 60% with normal wall motion.   Cardiolite Nuclear Stress Test  01/2008    heart rate increased from 80-158 BPM. No EKG changes.  there is a moderate sized  reversible defect in the inferior and inferior lateral wall as well as anteroseptal. EF 59%.   CardioNet Holter Monitor  01/2008    sinus rhythm. Rare PVCs in setting was. Current PACs , seen in singles and pairs. Max heart rate 127, minimum heart rate 57. Average 84.   CHOLECYSTECTOMY     COLON SURGERY     due to  gangrenous appendix   COLONOSCOPY WITH PROPOFOL  N/A 04/26/2016   Procedure: COLONOSCOPY WITH PROPOFOL ;  Surgeon: [name], MD;  Location: [facility] ENDOSCOPY;  Service: Endoscopy;  Laterality: N/A;   ESOPHAGOGASTRODUODENOSCOPY (EGD) WITH PROPOFOL  N/A 01/11/2019   Procedure: ESOPHAGOGASTRODUODENOSCOPY (EGD) WITH PROPOFOL ;  Surgeon: Kung Ruel, MD;  Location: [facility] ENDOSCOPY;  Service: Gastroenterology;  Laterality: N/A;   FLEXIBLE SIGMOIDOSCOPY N/A 01/11/2019   Procedure: FLEXIBLE SIGMOIDOSCOPY;  Surgeon: Kung Ruel, MD;  Location: [facility] ENDOSCOPY;  Service: Gastroenterology;  Laterality: N/A;   HERNIA REPAIR  2010   INNER EAR SURGERY     LEFT HEART CATH AND CORONARY ANGIOGRAPHY N/A 02/12/2019   Procedure: LEFT HEART CATH AND CORONARY ANGIOGRAPHY;  Surgeon: Anner Alm ORN, MD;  Location: [facility] INVASIVE CV LAB;  Service: Cardiovascular; Proximal RCA 40%.  Diffuse aneurysmal/ectatic (patulous) dominant LCx with no significant lesions.  Questionable 40 to 50% stenosis-most likely actually just normal diameter with surrounding ectasia/aneurysmal segments.  EF 55 to 60%.  Normal EDP.   NM MYOVIEW  LTD  01/10/2019   Treadmill Myoview : Walk 9:40 min-10.1 METS) EF ~45 to 50%.  INTERMEDIATE RISK STUDY-moderate sized mostly fixed perfusion defect in anterior apical segment.  No reversibility.  Suggests prior infarct without ischemia.   TONSILLECTOMY  TYMPANOSTOMY TUBE PLACEMENT Bilateral 1973   Patient Active Problem List   Diagnosis Date Noted   Hyperlipidemia associated with type 2 diabetes mellitus (HCC) 08/30/2023   Chronic heart failure (HCC) 08/30/2023   Fatigue due to treatment 10/24/2021   Worsening headaches 04/30/2019   Neck pain 04/30/2019   Bilateral occipital neuralgia 04/30/2019   Migraine without aura and without status migrainosus, not intractable 04/30/2019   Vertigo 04/30/2019   Abnormal nuclear stress test 02/07/2019   Atypical angina 02/07/2019   Central adiposity 08/10/2018   DM  (diabetes mellitus) (HCC) 07/08/2018   Medication management 01/01/2016   Hypercholesterolemia with hypertriglyceridemia 02/26/2015   Abnormally low high density lipoprotein (HDL) cholesterol with hypertriglyceridemia 02/26/2015   Atrial premature contractions 02/26/2015   Metabolic syndrome 02/26/2015   Rapid heart beat 02/26/2015   Signs and symptoms involving emotional state 05/29/2014   Anxiety 05/29/2014   Adult BMI 30+ 05/29/2014   Clinical depression 05/29/2014   Failure of erection 05/29/2014   Abnormal LFTs 05/29/2014   Blood glucose elevated 05/29/2014   GERD (gastroesophageal reflux disease) 05/29/2014   Combined fat and carbohydrate induced hyperlipemia 05/29/2014   Hypertension associated with diabetes (HCC) 05/29/2014   Headache, migraine 05/29/2014   Obesity, Class I, BMI 30.0-34.9 (see actual BMI) 05/29/2014   Gastroduodenal ulcer 05/29/2014   Apnea, sleep 05/29/2014   Umbilical hernia without obstruction and without gangrene 05/29/2014   Avitaminosis D 09/14/2009   Coronary artery disease, non-occlusive 02/27/2009    ONSET DATE: 8/25  REFERRING DIAG: R 2nd MC ulnar collateral ligament sprain, myofascial pain right upper extremity  THERAPY DIAG:  Pain in right hand  Localized edema  Stiffness of right hand, not elsewhere classified  History of sensory changes  Muscle weakness (generalized)  Rationale for Evaluation and Treatment: Rehabilitation  SUBJECTIVE:   SUBJECTIVE STATEMENT: 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  Lifting, pushing, pulling, using tools.  I do not have any strength in my right hand.  I have increased pain over the second knuckle.  I cannot bring it back.  I have sometimes numbness.  The pain goes up into my forearm to my neck.  I did not see the neurologist until January.  I had carpal tunnel surgery in bilateral hands years ago.  Had injury to my neck years ago. Pt accompanied by: self  PERTINENT HISTORY: 12/07/23 DR  Ezra note: Right Upper Extremity: Pain to palpation over the ulnar aspect of the index finger MCP joint. There is some mild swelling noted over the ulnar aspect of the index finger MCP joint. With flexion and extension of the digits, the extensor tendon remains centralized over the metacarpal phalangeal joint Without evidence of subluxation. The patient has fairly significant pain when the ulnar side of the MCP joint is stressed in extension and flexion. There is a stable endpoint. There is no pain with stress to the radial aspect of the MCP joint. Negative Tinel's, Phalen's, Durkan's at the wrist. Equivocal Tinel's at the elbow, negative flexion compression test at the elbow. Positive trigger points that reproduces the tingling in his hands and the forearm. 5 out of 5 thumb palmar abduction strength, 5 out of 5 finger abduction strength. Sensation intact to light touch throughout. Positive spurlings. Imaging and Results: Radiographs of the right hand obtained on 11/22/2023 in the [facility] health EMR were reviewed for the purpose of this visit. He has some mild arthritic changes at of the first [facility] joint with small osteophyte formation and some mild  metacarpal subsidence. No significant arthritis degenerative or erosive changes noted in the middle finger MCP or IP joints. He appears to have a subtle osteophyte at the metacarpal head at the index MCP joint but otherwise, joint spaces fairly well-maintained. No other obvious degenerative or erosive changes.  Assessment & Plan: Assessment & Plan Right hand ulnar MCP ligament sprain Suspected ulnar side ligament sprain of right hand MCP joint. Differential includes sagittal band rupture, but tendon remains centralized. - Provided buddy straps for finger support. - Referred to hand therapy for range of motion and pain management. - Advised continuation of current orthosis if comfortable.  Right hand numbness and tingling Numbness and tingling in all fingers,  not consistent with previous carpal tunnel symptoms. Symptoms do not clearly fit nerve entrapment or cervical radiculopathy. Myofascial pain syndrome possible. Nerve study pending. - Support obtaining nerve study to evaluate numbness and tingling. - Referred to hand therapy for potential trigger point management.  Follow-up in 2 to 3 months after therapy and nerve study   PRECAUTIONS: None   WEIGHT BEARING RESTRICTIONS: No  PAIN:  Are you having pain? 5/10 2nd MC and dorsal hand , am the best and end of day worse  FALLS: Has patient fallen in last 6 months? No  LIVING ENVIRONMENT: Lives with: lives with their spouse  PLOF: Had issues with the right hand gradually over the years.  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  Works 40 hours a week but sometimes more.  PATIENT GOALS: Want the pain in my right hand better and arm as well as get my strength back  NEXT MD VISIT: Report appointment with neurology in January  OBJECTIVE:  Note: Objective measures were completed at Evaluation unless otherwise noted.  HAND DOMINANCE: Right  ADLs: Pain left second digit limiting patient's grip to lift and carry and squeeze objects.  Compensate the ulnar side of the hands.  FUNCTIONAL OUTCOME MEASURES: PRWHE pain and function   UPPER EXTREMITY ROM:     Active ROM Right eval Left eval  Shoulder flexion    Shoulder abduction    Shoulder adduction    Shoulder extension    Shoulder internal rotation    Shoulder external rotation    Elbow flexion    Elbow extension    Wrist flexion 75   Wrist extension 64   Wrist ulnar deviation    Wrist radial deviation    Wrist pronation    Wrist supination    (Blank rows = not tested)  Active ROM Right eval Left eval  Thumb MCP (0-60)    Thumb IP (0-80)    Thumb Radial abd/add (0-55)     Thumb Palmar abd/add (0-45)     Thumb Opposition to Small Finger     Index MCP (0-90) 65    Index PIP (0-100) 90    Index DIP (0-70)       Long MCP (0-90)  70    Long PIP (0-100)  95    Long DIP (0-70)      Ring MCP (0-90)  65    Ring PIP (0-100)  90    Ring DIP (0-70)      Little MCP (0-90)  75    Little PIP (0-100)  95    Little DIP (0-70)      (Blank rows = not tested)    HAND FUNCTION: Grip strength: Right: 35 lbs; Left: 102 lbs, Lateral pinch: Right: 12 lbs, Left: 30 lbs, and 3  point pinch: Right: 5 lbs, Left: 23 lbs  COORDINATION: Nine-hole peg test to be done next time.  Limited by pain in second metacarpal of right hand  SENSATION: Patient report some numbness over dorsal hand between 2nd and 3rd metacarpal at times  EDEMA: Increase edema over 2nd and 3rd metacarpal and dorsal hand  COGNITION: Overall cognitive status: Within functional limits for tasks assessed      TREATMENT DATE: 12/18/23                                                                                                                            Patient to perform contrast 2-3 times a day but especially after working and before bedtime. Continue with Isotoner glove at nighttime to decrease pain and edema. Fabricated patient a custom MC block splint for right second metacarpal to be used at nighttime but also during the day as needed to prevent composite flexion and pain to second metacarpal.-Allowing PIP DIP flexion. Patient was educated in donning and doffing as well as wearing. Patient can wear it over his Isotoner glove. To decrease edema pain in second digit. Patient was also educated in joint protection and modification to enlarge grips to use larger joints to decrease pain and edema in second metacarpal and hopefully decreasing compensating with ulnar side of the hand and forearm causing forearm symptoms.     PATIENT EDUCATION: Education details: findings of eval and HEP  Person educated: Patient Education method: Explanation, Demonstration, Tactile cues, Verbal cues, and Handouts Education comprehension: verbalized  understanding, returned demonstration, verbal cues required, and needs further education   GOALS: Goals reviewed with patient? Yes  SHORT TERM GOALS: Target date: 2 weeks  Patient to be independent home program using modalities as well as Isotoner glove and MC block splint to decrease pain and edema at second digit to less than 6/10 pain Baseline: Increase edema and tenderness and pain in second digit of the right second metacarpal.  10/10.  With extension and composite flexion. Goal status: INITIAL  2.  Patient to verbalize 3 joint protection and modifications that he can implement to decrease pain and right hand and forearm. Baseline: No knowledge.  Patient works in holiday representative overusing hand, over gripping. Goal status: INITIAL   LONG TERM GOALS: Target date: 8 wks  Pain in right hand improved for patient to be able to make composite fist with pain less than at 2/10 to be able to use utensils, perform basic ADLs with more ease Baseline: Increase edema increased pain and tenderness over right second metacarpal unable to make composite flexion.  Pain can be a 10/10.  MC flexion of the second digit 65 degrees Goal status: INITIAL  2.  Grip strength in the right hand improved with more than 10 pounds for patient to be able to grip utensils and ADLs objects with more ease Baseline: Right grip 35 pounds and left arm 2 pounds. Goal status: INITIAL  3.  Right prehension strength  improved with more than 30 pounds for patient to be able to hold the plate, open Ziploc bags with less symptoms Baseline: Right lateral pinch 12 pounds and left 30 pounds.  3-point pinch on the right 5 pounds and left 23 pounds. Goal status: INITIAL   ASSESSMENT:  CLINICAL IMPRESSION: Patient seen today for occupational therapy evaluation for right second digit ulnar collateral ligament sprain at MCP.  Patient also has some osteoarthritis.  Patient present with increased edema, tenderness and pain with right  second digit composite flexion, MC flexion and extension.  Tenderness to touch.  Patient also with myofascial pain in the right forearm dorsal and volarly.  Patient with decreased grip and prehension strength in right dominant hand.  But also some sensory changes over the dorsal hand at the second third metacarpal.  All of the above limiting patient's functional use of right dominant hand in ADLs and IADLs.  Patient was educated in joint protection and modifications as well as home program to use modalities, Isotoner glove at nighttime and a MC block splint to avoid composite flexion of second digit.  Patient can benefit from skilled OT services to decrease edema and pain increase motion and strength to return to prior level of function.  Patient appeared to have a sensory component because of positive test with a radial nerve glide and ulnar nerve glide.  Patient reports he has appointment with neurology in January.  Orthopedics get order at [facility].  PERFORMANCE DEFICITS: in functional skills including ADLs, IADLs, ROM, strength, pain, flexibility, decreased knowledge of use of DME, and UE functional use,   and psychosocial skills including environmental adaptation and routines and behaviors.   IMPAIRMENTS: are limiting patient from ADLs, IADLs, rest and sleep, play, leisure, and social participation.   COMORBIDITIES: has no other co-morbidities that affects occupational performance. Patient will benefit from skilled OT to address above impairments and improve overall function.  MODIFICATION OR ASSISTANCE TO COMPLETE EVALUATION: No modification of tasks or assist necessary to complete an evaluation.  OT OCCUPATIONAL PROFILE AND HISTORY: Problem focused assessment: Including review of records relating to presenting problem.  CLINICAL DECISION MAKING: LOW - limited treatment options, no task modification necessary  REHAB POTENTIAL: Good for goals  EVALUATION COMPLEXITY: Low      PLAN:  OT  FREQUENCY: 1-2x/week  OT DURATION: 8 weeks  PLANNED INTERVENTIONS: 97168 OT Re-evaluation, 97535 self care/ADL training, 02889 therapeutic exercise, 97530 therapeutic activity, 97112 neuromuscular re-education, 97140 manual therapy, 97035 ultrasound, 97018 paraffin, 02960 fluidotherapy, 97034 contrast bath, 97033 iontophoresis, 97760 Orthotic Initial, [id number] Orthotic/Prosthetic subsequent, passive range of motion, patient/family education, and DME and/or AE instructions    CONSULTED AND AGREED WITH PLAN OF CARE: Patient     Ancel Peters, OTR/L,CLT 12/18/2023, 7:34 PM

## 2023-12-18 ENCOUNTER — Ambulatory Visit: Admitting: Occupational Therapy

## 2023-12-18 ENCOUNTER — Encounter: Payer: Self-pay | Admitting: Occupational Therapy

## 2023-12-18 DIAGNOSIS — M25641 Stiffness of right hand, not elsewhere classified: Secondary | ICD-10-CM | POA: Insufficient documentation

## 2023-12-18 DIAGNOSIS — R600 Localized edema: Secondary | ICD-10-CM | POA: Insufficient documentation

## 2023-12-18 DIAGNOSIS — M6281 Muscle weakness (generalized): Secondary | ICD-10-CM | POA: Diagnosis present

## 2023-12-18 DIAGNOSIS — M79641 Pain in right hand: Secondary | ICD-10-CM | POA: Insufficient documentation

## 2023-12-18 DIAGNOSIS — Z9289 Personal history of other medical treatment: Secondary | ICD-10-CM | POA: Diagnosis present

## 2023-12-28 ENCOUNTER — Other Ambulatory Visit

## 2023-12-28 DIAGNOSIS — Z7984 Long term (current) use of oral hypoglycemic drugs: Secondary | ICD-10-CM

## 2023-12-28 DIAGNOSIS — E119 Type 2 diabetes mellitus without complications: Secondary | ICD-10-CM

## 2023-12-28 NOTE — Progress Notes (Signed)
 S:     Reason for visit: ?  Robert Franklin is a 58 y.o. male with a history of diabetes (type 2), who presents today for a follow up diabetes Telephone pharmacotherapy visit.? Pertinent PMH also includes CAD, HTN, migraines, GERD, HLD, depression, CHF, obesity, and anxiety.   Care Team: Primary Care Provider: Ostwalt, Janna, PA-C  At last visit with clincal pharmacist on 11/16/23, patient had not been wearing CGM for over 2 weeks d/t recent steroid usage. Previous GMI of 7.1%. Patient was started on Rybelsus  3 mg and instructed to discontinue glipizide  at that time.   Today, he reports he has not been wearing his CGM for about 3 weeks. They are in the process of moving and his sensors got packed away, so he is not positive where they are located in the home at the moment. He also reports he did not start Rybelsus  3 mg until about 1 week ago. He reports his past month has been very stressful with moving and family situations, leading to dietary indiscretions.   Current diabetes medications include: Farxiga  10 mg daily, Rybelsus  3 mg daily  Previous diabetes medications include: metformin  (drops BG), pioglitazone , glipizide  Current hypertension medications include: irbesartan  150 mg daily, metoprolol  succinate 50 mg daily, furosemide  20 mg daily prn Current hyperlipidemia medications include: Vascepa  1g 2 capsules BID, ezetimibe  10 mg daily(not taking)  Patient reports adherence to taking all medications as prescribed. He separates out his medications based on AM and PM administration.  Have you been experiencing any side effects to the medications prescribed? no Do you have any problems obtaining medications due to transportation or finances? no Insurance coverage: BCBS  Patient denies hypoglycemic events.  Reported home fasting blood sugars: not checking since last visit  Patient reported dietary habits: Eats 1-2 meals/day Snacks: nuts Drinks: water, coffee *working to cut  out red meat and carbs. Increasing fish intake    Patient-reported exercise habits: stays active at his job (15 miles of walking/day at work)  DM Prevention:  Statin: Declines.?  ACE/ARB: yes; irbesartan  Last urinary albumin/creatinine ratio:  Lab Results  Component Value Date   MICRALBCREAT 48 (H) 10/05/2023   MICRALBCREAT 19 02/14/2022   Last eye exam:     Last foot exam: No foot exam found Tobacco Use:  Tobacco Use: Medium Risk (12/18/2023)   Patient History    Smoking Tobacco Use: Former    Smokeless Tobacco Use: Never    Passive Exposure: Not on file   O:  Vitals:  Wt Readings from Last 3 Encounters:  11/22/23 218 lb (98.9 kg)  11/16/23 221 lb 6.4 oz (100.4 kg)  10/05/23 211 lb (95.7 kg)   BP Readings from Last 3 Encounters:  11/22/23 122/84  11/16/23 (!) 132/92  10/21/23 (!) 179/98   Pulse Readings from Last 3 Encounters:  11/22/23 75  11/16/23 69  10/21/23 62     Labs:?  Lab Results  Component Value Date   HGBA1C 7.3 (H) 07/21/2023   HGBA1C 6.7 (H) 06/15/2022   HGBA1C 6.9 (H) 10/12/2021   GLUCOSE 120 (H) 11/16/2023   MICRALBCREAT 48 (H) 10/05/2023   MICRALBCREAT 19 02/14/2022   CREATININE 1.12 11/16/2023   CREATININE 1.06 10/05/2023   CREATININE 1.14 07/21/2023    Lab Results  Component Value Date   CHOL 227 (H) 10/05/2023   LDLCALC 129 (H) 10/05/2023   LDLCALC 122 (H) 07/21/2023   LDLCALC 97 06/12/2023   LDLDIRECT 46 07/05/2018   HDL 30 (L) 10/05/2023  TRIG 381 (H) 10/05/2023   TRIG 420 (H) 07/21/2023   TRIG 464 (H) 06/12/2023   ALT 28 10/05/2023   ALT 24 07/21/2023   AST 30 10/05/2023   AST 26 07/21/2023      Chemistry      Component Value Date/Time   NA 140 11/16/2023 1608   NA 135 (L) 01/29/2013 0111   K 4.5 11/16/2023 1608   K 4.1 01/29/2013 0111   CL 100 11/16/2023 1608   CL 103 01/29/2013 0111   CO2 24 11/16/2023 1608   CO2 28 01/29/2013 0111   BUN 24 11/16/2023 1608   BUN 25 (H) 01/29/2013 0111   CREATININE 1.12  11/16/2023 1608   CREATININE 1.01 01/29/2013 0111   GLU 80 10/07/2013 0000      Component Value Date/Time   CALCIUM  10.2 11/16/2023 1608   CALCIUM  9.2 01/29/2013 0111   ALKPHOS 88 10/05/2023 1556   AST 30 10/05/2023 1556   ALT 28 10/05/2023 1556   BILITOT 0.8 10/05/2023 1556       The 10-year ASCVD risk score (Arnett DK, et al., 2019) is: 24.7%  Lab Results  Component Value Date   MICRALBCREAT 48 (H) 10/05/2023   MICRALBCREAT 19 02/14/2022    A/P: Diabetes currently uncontrolled with a most recent A1c of 7.3% on 07/21/23 Patient is able to verbalize appropriate hypoglycemia management plan. Medication adherence appears appropriate, however, he only recently started Rybelsus  about 1 week prior. Will maintain current regimen at this time. Unable to fully assess control as he has not been wearing his CGM or monitoring his BG via fingerstick. Will set aside a sample of FSL3 as he has been unable to find his sensors since they got packed up in moving boxes. -Continued GLP-1 Rybelsus  (semaglutide ) 3 mg daily  -Continued SGLT2-I Farxiga  (dapagliflozin )10 mg daily.  -Extensively discussed pathophysiology of diabetes, recommended lifestyle interventions, dietary effects on blood sugar control.  -Counseled on s/sx of and management of hypoglycemia.  -Next A1c anticipated at follow up in person.   ASCVD risk - secondary prevention in patient with diabetes. Last LDL is 122 mg/dL, not at goal of <44 mg/dL. History of statin intolerance, particularly reports that BG is elevated with previous cholesterol medications. -Continue Vascepa  1 g 2 capsules BID -Continued ezetimibe  10 mg daily  Patient verbalized understanding of treatment plan. Total time patient counseling 30 minutes.  Follow-up:  Pharmacist on 02/15/24 PCP clinic visit on 02/15/24  Peyton CHARLENA Ferries, PharmD, CPP Clinical Pharmacist [facility] [phone]

## 2024-01-01 ENCOUNTER — Ambulatory Visit: Admitting: Occupational Therapy

## 2024-01-01 ENCOUNTER — Telehealth: Payer: Self-pay | Admitting: Occupational Therapy

## 2024-01-01 NOTE — Telephone Encounter (Signed)
 Patient no-showed today's appointment; Left message to call if need to cont OT - Has appt hold for him next Monday same time- but need to let us  know please  A lot of people wants that slot  Thank you  At [phone]

## 2024-01-03 ENCOUNTER — Ambulatory Visit

## 2024-01-06 ENCOUNTER — Other Ambulatory Visit: Payer: Self-pay | Admitting: Physician Assistant

## 2024-01-06 DIAGNOSIS — E1169 Type 2 diabetes mellitus with other specified complication: Secondary | ICD-10-CM

## 2024-01-08 ENCOUNTER — Ambulatory Visit: Admitting: Occupational Therapy

## 2024-01-08 DIAGNOSIS — R600 Localized edema: Secondary | ICD-10-CM

## 2024-01-08 DIAGNOSIS — M79641 Pain in right hand: Secondary | ICD-10-CM | POA: Diagnosis present

## 2024-01-08 DIAGNOSIS — Z9289 Personal history of other medical treatment: Secondary | ICD-10-CM

## 2024-01-08 DIAGNOSIS — M6281 Muscle weakness (generalized): Secondary | ICD-10-CM | POA: Insufficient documentation

## 2024-01-08 DIAGNOSIS — M25641 Stiffness of right hand, not elsewhere classified: Secondary | ICD-10-CM

## 2024-01-08 NOTE — Therapy (Signed)
 OUTPATIENT OCCUPATIONAL THERAPY ORTHO TREATMENT  Patient Name: Robert Franklin MRN: [id number] DOB:11-15-1965, 58 y.o., male Today's Date: 01/08/2024  PCP: Dineen Diener  REFERRING PROVIDER: DR Ezra  END OF SESSION:  OT End of Session - 01/08/24 1615     Visit Number 2    Number of Visits 8    Date for Recertification  02/12/24    OT Start Time 1615    OT Stop Time 1654    OT Time Calculation (min) 39 min    Activity Tolerance Patient tolerated treatment well    Behavior During Therapy [facility] for tasks assessed/performed          Past Medical History:  Diagnosis Date   Coronary artery disease, non-occlusive 02/28/2008   Dr. Fernand @ [facility]: 2 x 40-50% lesions in dominant LCx lesion by cath,  EF 60% with normal wall motion.;  Follow catheterization January 2021 showed stable findings.  LCx lesions likely not stenoses, more normal diameter vessel in setting of diffuse ectasia/aneurysm.   Diabetes mellitus without complication (HCC)    Essential hypertension    GERD (gastroesophageal reflux disease)    Headache    High triglycerides     was previously on treatment, but  he notes that this was stopped   Kidney stones    Moderate obesity 05/29/2014   OSA treated with BiPAP    Short bowel syndrome 1994   Past Surgical History:  Procedure Laterality Date   stomach wrap  1991   GERD   APPENDECTOMY     CARDIAC CATHETERIZATION  02/2008   Dr. Fernand @ [facility]: 2 x 40-50% lesions in a dominant LCx by cath,  EF 60% with normal wall motion.   Cardiolite Nuclear Stress Test  01/2008    heart rate increased from 80-158 BPM. No EKG changes.  there is a moderate sized  reversible defect in the inferior and inferior lateral wall as well as anteroseptal. EF 59%.   CardioNet Holter Monitor  01/2008    sinus rhythm. Rare PVCs in setting was. Current PACs , seen in singles and pairs. Max heart rate 127, minimum heart rate 57. Average 84.   CHOLECYSTECTOMY     COLON SURGERY     due to  gangrenous appendix   COLONOSCOPY WITH PROPOFOL  N/A 04/26/2016   Procedure: COLONOSCOPY WITH PROPOFOL ;  Surgeon: [name], MD;  Location: [facility] ENDOSCOPY;  Service: Endoscopy;  Laterality: N/A;   ESOPHAGOGASTRODUODENOSCOPY (EGD) WITH PROPOFOL  N/A 01/11/2019   Procedure: ESOPHAGOGASTRODUODENOSCOPY (EGD) WITH PROPOFOL ;  Surgeon: Kung Ruel, MD;  Location: [facility] ENDOSCOPY;  Service: Gastroenterology;  Laterality: N/A;   FLEXIBLE SIGMOIDOSCOPY N/A 01/11/2019   Procedure: FLEXIBLE SIGMOIDOSCOPY;  Surgeon: Kung Ruel, MD;  Location: [facility] ENDOSCOPY;  Service: Gastroenterology;  Laterality: N/A;   HERNIA REPAIR  2010   INNER EAR SURGERY     LEFT HEART CATH AND CORONARY ANGIOGRAPHY N/A 02/12/2019   Procedure: LEFT HEART CATH AND CORONARY ANGIOGRAPHY;  Surgeon: Anner Alm ORN, MD;  Location: [facility] INVASIVE CV LAB;  Service: Cardiovascular; Proximal RCA 40%.  Diffuse aneurysmal/ectatic (patulous) dominant LCx with no significant lesions.  Questionable 40 to 50% stenosis-most likely actually just normal diameter with surrounding ectasia/aneurysmal segments.  EF 55 to 60%.  Normal EDP.   NM MYOVIEW  LTD  01/10/2019   Treadmill Myoview : Walk 9:40 min-10.1 METS) EF ~45 to 50%.  INTERMEDIATE RISK STUDY-moderate sized mostly fixed perfusion defect in anterior apical segment.  No reversibility.  Suggests prior infarct without ischemia.   TONSILLECTOMY  TYMPANOSTOMY TUBE PLACEMENT Bilateral 1973   Patient Active Problem List   Diagnosis Date Noted   Hyperlipidemia associated with type 2 diabetes mellitus (HCC) 08/30/2023   Chronic heart failure (HCC) 08/30/2023   Fatigue due to treatment 10/24/2021   Worsening headaches 04/30/2019   Neck pain 04/30/2019   Bilateral occipital neuralgia 04/30/2019   Migraine without aura and without status migrainosus, not intractable 04/30/2019   Vertigo 04/30/2019   Abnormal nuclear stress test 02/07/2019   Atypical angina 02/07/2019   Central adiposity 08/10/2018   DM  (diabetes mellitus) (HCC) 07/08/2018   Medication management 01/01/2016   Hypercholesterolemia with hypertriglyceridemia 02/26/2015   Abnormally low high density lipoprotein (HDL) cholesterol with hypertriglyceridemia 02/26/2015   Atrial premature contractions 02/26/2015   Metabolic syndrome 02/26/2015   Rapid heart beat 02/26/2015   Signs and symptoms involving emotional state 05/29/2014   Anxiety 05/29/2014   Adult BMI 30+ 05/29/2014   Clinical depression 05/29/2014   Failure of erection 05/29/2014   Abnormal LFTs 05/29/2014   Blood glucose elevated 05/29/2014   GERD (gastroesophageal reflux disease) 05/29/2014   Combined fat and carbohydrate induced hyperlipemia 05/29/2014   Hypertension associated with diabetes (HCC) 05/29/2014   Headache, migraine 05/29/2014   Obesity, Class I, BMI 30.0-34.9 (see actual BMI) 05/29/2014   Gastroduodenal ulcer 05/29/2014   Apnea, sleep 05/29/2014   Umbilical hernia without obstruction and without gangrene 05/29/2014   Avitaminosis D 09/14/2009   Coronary artery disease, non-occlusive 02/27/2009    ONSET DATE: 8/25  REFERRING DIAG: R 2nd MC ulnar collateral ligament sprain, myofascial pain right upper extremity  THERAPY DIAG:  Pain in right hand  Localized edema  Stiffness of right hand, not elsewhere classified  History of sensory changes  Muscle weakness (generalized)  Rationale for Evaluation and Treatment: Rehabilitation  SUBJECTIVE:   SUBJECTIVE STATEMENT: .  I did like you told me in for a week I did the hot and cold in the splint and my compression glove.  My hand and finger looked really good.  But then I pulled my hand out of my cold glove the other day  moving my arm and my finger pulled and it went back to what it was-my neck continues to bother me in the back of my shoulders.  I did see the neurologist until January  pt accompanied by: self  PERTINENT HISTORY: 12/07/23 DR Ezra note: Right Upper Extremity: Pain to  palpation over the ulnar aspect of the index finger MCP joint. There is some mild swelling noted over the ulnar aspect of the index finger MCP joint. With flexion and extension of the digits, the extensor tendon remains centralized over the metacarpal phalangeal joint Without evidence of subluxation. The patient has fairly significant pain when the ulnar side of the MCP joint is stressed in extension and flexion. There is a stable endpoint. There is no pain with stress to the radial aspect of the MCP joint. Negative Tinel's, Phalen's, Durkan's at the wrist. Equivocal Tinel's at the elbow, negative flexion compression test at the elbow. Positive trigger points that reproduces the tingling in his hands and the forearm. 5 out of 5 thumb palmar abduction strength, 5 out of 5 finger abduction strength. Sensation intact to light touch throughout. Positive spurlings. Imaging and Results: Radiographs of the right hand obtained on 11/22/2023 in the [facility] health EMR were reviewed for the purpose of this visit. He has some mild arthritic changes at of the first [facility] joint with small osteophyte formation and some mild metacarpal  subsidence. No significant arthritis degenerative or erosive changes noted in the middle finger MCP or IP joints. He appears to have a subtle osteophyte at the metacarpal head at the index MCP joint but otherwise, joint spaces fairly well-maintained. No other obvious degenerative or erosive changes.  Assessment & Plan: Assessment & Plan Right hand ulnar MCP ligament sprain Suspected ulnar side ligament sprain of right hand MCP joint. Differential includes sagittal band rupture, but tendon remains centralized. - Provided buddy straps for finger support. - Referred to hand therapy for range of motion and pain management. - Advised continuation of current orthosis if comfortable.  Right hand numbness and tingling Numbness and tingling in all fingers, not consistent with previous carpal tunnel  symptoms. Symptoms do not clearly fit nerve entrapment or cervical radiculopathy. Myofascial pain syndrome possible. Nerve study pending. - Support obtaining nerve study to evaluate numbness and tingling. - Referred to hand therapy for potential trigger point management.  Follow-up in 2 to 3 months after therapy and nerve study   PRECAUTIONS: None   WEIGHT BEARING RESTRICTIONS: No  PAIN:  Are you having pain? 9/10 left dorsal radial hand and second finger  FALLS: Has patient fallen in last 6 months? No  LIVING ENVIRONMENT: Lives with: lives with their spouse  PLOF: Had issues with the right hand gradually over the years.  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  Works 40 hours a week but sometimes more.  PATIENT GOALS: Want the pain in my right hand better and arm as well as get my strength back  NEXT MD VISIT: Report appointment with neurology in January  OBJECTIVE:  Note: Objective measures were completed at Evaluation unless otherwise noted.  HAND DOMINANCE: Right  ADLs: Pain left second digit limiting patient's grip to lift and carry and squeeze objects.  Compensate the ulnar side of the hands.  FUNCTIONAL OUTCOME MEASURES: PRWHE pain and function   UPPER EXTREMITY ROM:     Active ROM Right eval Left eval  Shoulder flexion    Shoulder abduction    Shoulder adduction    Shoulder extension    Shoulder internal rotation    Shoulder external rotation    Elbow flexion    Elbow extension    Wrist flexion 75   Wrist extension 64   Wrist ulnar deviation    Wrist radial deviation    Wrist pronation    Wrist supination    (Blank rows = not tested)  Active ROM Right eval Left eval  Thumb MCP (0-60)    Thumb IP (0-80)    Thumb Radial abd/add (0-55)     Thumb Palmar abd/add (0-45)     Thumb Opposition to Small Finger     Index MCP (0-90) 65    Index PIP (0-100) 90    Index DIP (0-70)      Long MCP (0-90)  70    Long PIP (0-100)  95     Long DIP (0-70)      Ring MCP (0-90)  65    Ring PIP (0-100)  90    Ring DIP (0-70)      Little MCP (0-90)  75    Little PIP (0-100)  95    Little DIP (0-70)      (Blank rows = not tested)    HAND FUNCTION: Grip strength: Right: 35 lbs; Left: 102 lbs, Lateral pinch: Right: 12 lbs, Left: 30 lbs, and 3 point pinch: Right: 5 lbs, Left: 23 lbs  COORDINATION: Nine-hole peg test to be done next time.  Limited by pain in second metacarpal of right hand  SENSATION: Patient report some numbness over dorsal hand between 2nd and 3rd metacarpal at times  EDEMA: Increase edema over 2nd and 3rd metacarpal and dorsal hand  COGNITION: Overall cognitive status: Within functional limits for tasks assessed      TREATMENT DATE: 01/08/24                                                                                                                        Patient reports he has been doing contrast as well as compression glove and MC block splint at nighttime.   He was doing great until about last week.   When he pulled his arm out of his ice pack  Felt a shooting pain from the shoulder down the arm to the second finger.  Felt like a little pop at the second finger  And since then the the swelling was back and the pain.   This date patient digit flexion about the same in the evaluation Grip and prehension strength decreased since evaluation see flowsheet Attempt at radial nerve glides.  Patient positive Tinel when adding lateral flexion of head/cervical Ulnar nerve glide negative Tenderness over the ulnar side of second metacarpal as well as dorsal radial hand Pain with composite flexion especially second metacarpal    Patient continued to do contrast 2-3 times a day but especially after working and before bedtime. Continue with Isotoner glove at nighttime to decrease pain and edema. Continue to wear custom MC block splint for right second metacarpal to be used at nighttime but also during  the day as needed to prevent composite flexion and pain to second metacarpal.-Allowing PIP DIP flexion. Reviewed with patient again donning and doffing as well as wearing. Patient can wear it over his Isotoner glove. To decrease edema pain in second digit. Reviewed with patient again also joint protection and modification to enlarge grips to use larger joints to decrease pain and edema in second metacarpal and hopefully decreasing compensating with ulnar side of the hand and forearm causing forearm symptoms.       PATIENT EDUCATION: Education details: findings of eval and HEP  Person educated: Patient Education method: Explanation, Demonstration, Tactile cues, Verbal cues, and Handouts Education comprehension: verbalized understanding, returned demonstration, verbal cues required, and needs further education   GOALS: Goals reviewed with patient? Yes  SHORT TERM GOALS: Target date: 2 weeks  Patient to be independent home program using modalities as well as Isotoner glove and MC block splint to decrease pain and edema at second digit to less than 6/10 pain Baseline: Increase edema and tenderness and pain in second digit of the right second metacarpal.  10/10.  With extension and composite flexion. Goal status: INITIAL  2.  Patient to verbalize 3 joint protection and modifications that he can implement to decrease pain and right hand and forearm. Baseline: No knowledge.  Patient works in  construction overusing hand, over gripping. Goal status: INITIAL   LONG TERM GOALS: Target date: 8 wks  Pain in right hand improved for patient to be able to make composite fist with pain less than at 2/10 to be able to use utensils, perform basic ADLs with more ease Baseline: Increase edema increased pain and tenderness over right second metacarpal unable to make composite flexion.  Pain can be a 10/10.  MC flexion of the second digit 65 degrees Goal status: INITIAL  2.  Grip strength in the right  hand improved with more than 10 pounds for patient to be able to grip utensils and ADLs objects with more ease Baseline: Right grip 35 pounds and left arm 2 pounds. Goal status: INITIAL  3.  Right prehension strength improved with more than 30 pounds for patient to be able to hold the plate, open Ziploc bags with less symptoms Baseline: Right lateral pinch 12 pounds and left 30 pounds.  3-point pinch on the right 5 pounds and left 23 pounds. Goal status: INITIAL   ASSESSMENT:  CLINICAL IMPRESSION: Patient seen for occupational therapy for right second digit ulnar collateral ligament sprain at MCP.  Patient also has some osteoarthritis.  Patient present with increased edema, tenderness and pain with right second digit composite flexion, MC flexion and extension.  Tenderness to touch.  Patient also with myofascial pain in the right forearm dorsal and volarly.  Patient with decreased grip and prehension strength in right dominant hand.  But also some sensory changes over the dorsal hand at the second third metacarpal.  NOW patient missed the appointment but return to therapy after being evaluated about 3 weeks ago.  Patient reports he has been doing really good up to last week with performing home program.  Was pulling his hand out of his ice pack and felt a shooting pain from the shoulder down to the second digit.  Since then 9/10 pain over the dorsal radial hand into the second metacarpal.  Increased edema and increased pain.  Patient was also positive Tinel with attempts of radial nerve glide when adding left cervical flexion.  Patient to continue with same home program.  Patient has appointment with neurology in January.  All of the above limiting patient's functional use of right dominant hand in ADLs and IADLs.  Patient was educated again in joint protection and modifications as well as home program to use modalities, Isotoner glove at nighttime and a MC block splint to avoid composite flexion of  second digit.  Patient can benefit from skilled OT services to decrease edema and pain increase motion and strength to return to prior level of function.  Await appointment with neurology in January.  PERFORMANCE DEFICITS: in functional skills including ADLs, IADLs, ROM, strength, pain, flexibility, decreased knowledge of use of DME, and UE functional use,   and psychosocial skills including environmental adaptation and routines and behaviors.   IMPAIRMENTS: are limiting patient from ADLs, IADLs, rest and sleep, play, leisure, and social participation.   COMORBIDITIES: has no other co-morbidities that affects occupational performance. Patient will benefit from skilled OT to address above impairments and improve overall function.  MODIFICATION OR ASSISTANCE TO COMPLETE EVALUATION: No modification of tasks or assist necessary to complete an evaluation.  OT OCCUPATIONAL PROFILE AND HISTORY: Problem focused assessment: Including review of records relating to presenting problem.  CLINICAL DECISION MAKING: LOW - limited treatment options, no task modification necessary  REHAB POTENTIAL: Good for goals  EVALUATION COMPLEXITY: Low  PLAN:  OT FREQUENCY: 1-2x/week  OT DURATION: 8 weeks  PLANNED INTERVENTIONS: 97168 OT Re-evaluation, 97535 self care/ADL training, 02889 therapeutic exercise, 97530 therapeutic activity, 97112 neuromuscular re-education, 97140 manual therapy, 97035 ultrasound, 97018 paraffin, 02960 fluidotherapy, 97034 contrast bath, 97033 iontophoresis, 97760 Orthotic Initial, [id number] Orthotic/Prosthetic subsequent, passive range of motion, patient/family education, and DME and/or AE instructions    CONSULTED AND AGREED WITH PLAN OF CARE: Patient     Ancel Peters, OTR/L,CLT 01/08/2024, 6:07 PM

## 2024-01-15 ENCOUNTER — Ambulatory Visit: Admitting: Occupational Therapy

## 2024-02-01 ENCOUNTER — Other Ambulatory Visit: Payer: Self-pay | Admitting: Student

## 2024-02-01 DIAGNOSIS — E119 Type 2 diabetes mellitus without complications: Secondary | ICD-10-CM

## 2024-02-09 ENCOUNTER — Encounter: Admission: RE | Disposition: A | Payer: Self-pay | Source: Home / Self Care | Attending: Gastroenterology

## 2024-02-09 ENCOUNTER — Encounter: Payer: Self-pay | Admitting: Gastroenterology

## 2024-02-09 ENCOUNTER — Ambulatory Visit: Admitting: Certified Registered Nurse Anesthetist

## 2024-02-09 ENCOUNTER — Ambulatory Visit
Admission: RE | Admit: 2024-02-09 | Discharge: 2024-02-09 | Disposition: A | Discharge status: Home / Self Care | Attending: Gastroenterology | Admitting: Gastroenterology

## 2024-02-09 DIAGNOSIS — Z7984 Long term (current) use of oral hypoglycemic drugs: Secondary | ICD-10-CM | POA: Diagnosis not present

## 2024-02-09 DIAGNOSIS — Z79899 Other long term (current) drug therapy: Secondary | ICD-10-CM | POA: Diagnosis not present

## 2024-02-09 DIAGNOSIS — G4733 Obstructive sleep apnea (adult) (pediatric): Secondary | ICD-10-CM | POA: Diagnosis not present

## 2024-02-09 DIAGNOSIS — Z87891 Personal history of nicotine dependence: Secondary | ICD-10-CM | POA: Insufficient documentation

## 2024-02-09 DIAGNOSIS — Z980 Intestinal bypass and anastomosis status: Secondary | ICD-10-CM | POA: Diagnosis not present

## 2024-02-09 DIAGNOSIS — Z83719 Family history of colon polyps, unspecified: Secondary | ICD-10-CM | POA: Insufficient documentation

## 2024-02-09 DIAGNOSIS — Z8249 Family history of ischemic heart disease and other diseases of the circulatory system: Secondary | ICD-10-CM | POA: Insufficient documentation

## 2024-02-09 DIAGNOSIS — E119 Type 2 diabetes mellitus without complications: Secondary | ICD-10-CM | POA: Diagnosis not present

## 2024-02-09 DIAGNOSIS — Z1211 Encounter for screening for malignant neoplasm of colon: Secondary | ICD-10-CM | POA: Insufficient documentation

## 2024-02-09 DIAGNOSIS — Z8711 Personal history of peptic ulcer disease: Secondary | ICD-10-CM | POA: Insufficient documentation

## 2024-02-09 DIAGNOSIS — I10 Essential (primary) hypertension: Secondary | ICD-10-CM | POA: Insufficient documentation

## 2024-02-09 DIAGNOSIS — K219 Gastro-esophageal reflux disease without esophagitis: Secondary | ICD-10-CM | POA: Diagnosis not present

## 2024-02-09 DIAGNOSIS — Z9049 Acquired absence of other specified parts of digestive tract: Secondary | ICD-10-CM | POA: Insufficient documentation

## 2024-02-09 DIAGNOSIS — I2510 Atherosclerotic heart disease of native coronary artery without angina pectoris: Secondary | ICD-10-CM | POA: Insufficient documentation

## 2024-02-09 HISTORY — PX: COLONOSCOPY: SHX5424

## 2024-02-09 LAB — GLUCOSE, CAPILLARY: Glucose-Capillary: 203 mg/dL — ABNORMAL HIGH (ref 70–99)

## 2024-02-09 MED ORDER — FENTANYL CITRATE (PF) 100 MCG/2ML IJ SOLN
INTRAMUSCULAR | Status: AC
  Filled 2024-02-09: qty 2

## 2024-02-09 MED ORDER — PHENYLEPHRINE 80 MCG/ML (10ML) SYRINGE FOR IV PUSH (FOR BLOOD PRESSURE SUPPORT)
PREFILLED_SYRINGE | INTRAVENOUS | Status: DC | PRN
  Administered 2024-02-09: 120 ug via INTRAVENOUS
  Administered 2024-02-09 (×3): 80 ug via INTRAVENOUS

## 2024-02-09 MED ORDER — ONDANSETRON HCL 4 MG/2ML IJ SOLN
INTRAMUSCULAR | Status: DC | PRN
  Administered 2024-02-09: 4 mg via INTRAVENOUS

## 2024-02-09 MED ORDER — FENTANYL CITRATE (PF) 100 MCG/2ML IJ SOLN
INTRAMUSCULAR | Status: DC | PRN
  Administered 2024-02-09: 50 ug via INTRAVENOUS

## 2024-02-09 MED ORDER — SUCCINYLCHOLINE CHLORIDE 200 MG/10ML IV SOSY
PREFILLED_SYRINGE | INTRAVENOUS | Status: DC | PRN
  Administered 2024-02-09: 100 mg via INTRAVENOUS

## 2024-02-09 MED ORDER — SODIUM CHLORIDE 0.9 % IV SOLN
INTRAVENOUS | Status: DC
  Administered 2024-02-09: 20 mL/h via INTRAVENOUS

## 2024-02-09 MED ORDER — EPHEDRINE SULFATE-NACL 50-0.9 MG/10ML-% IV SOSY
PREFILLED_SYRINGE | INTRAVENOUS | Status: DC | PRN
  Administered 2024-02-09: 10 mg via INTRAVENOUS
  Administered 2024-02-09: 5 mg via INTRAVENOUS

## 2024-02-09 MED ORDER — ONDANSETRON HCL 4 MG/2ML IJ SOLN
INTRAMUSCULAR | Status: AC
  Filled 2024-02-09: qty 2

## 2024-02-09 MED ORDER — LIDOCAINE HCL (CARDIAC) PF 100 MG/5ML IV SOSY
PREFILLED_SYRINGE | INTRAVENOUS | Status: DC | PRN
  Administered 2024-02-09: 100 mg via INTRAVENOUS

## 2024-02-09 MED ORDER — PHENYLEPHRINE 80 MCG/ML (10ML) SYRINGE FOR IV PUSH (FOR BLOOD PRESSURE SUPPORT)
PREFILLED_SYRINGE | INTRAVENOUS | Status: AC
  Filled 2024-02-09: qty 10

## 2024-02-09 MED ORDER — SUCCINYLCHOLINE CHLORIDE 200 MG/10ML IV SOSY
PREFILLED_SYRINGE | INTRAVENOUS | Status: AC
  Filled 2024-02-09: qty 10

## 2024-02-09 MED ORDER — PROPOFOL 10 MG/ML IV BOLUS
INTRAVENOUS | Status: DC | PRN
  Administered 2024-02-09: 200 mg via INTRAVENOUS
  Administered 2024-02-09: 80 mg via INTRAVENOUS

## 2024-02-09 MED ORDER — EPHEDRINE 5 MG/ML INJ
INTRAVENOUS | Status: AC
  Filled 2024-02-09: qty 5

## 2024-02-09 NOTE — Op Note (Signed)
 [facility] Gastroenterology Patient Name: Robert Franklin Procedure Date: 02/09/2024 8:42 AM MRN: [id number] Account #: [id number] Date of Birth: 08-20-1965 Admit Type: Outpatient Age: 59 Room: [facility] ENDO ROOM 4 Gender: Male Note Status: Finalized Instrument Name: Colon Scope [id number] Procedure:             Colonoscopy Indications:           Colon cancer screening in patient at increased risk:                         Family history of 1st-degree relative with colon                         polyps, Last colonoscopy: April 2018 Providers:             [name] MD, MD Referring MD:          Dineen, Medicines:             Monitored Anesthesia Care Complications:         No immediate complications. Procedure:             Pre-Anesthesia Assessment:                        - Prior to the procedure, a History and Physical was                         performed, and patient medications, allergies and                         sensitivities were reviewed. The patient's tolerance                         of previous anesthesia was reviewed.                        - The risks and benefits of the procedure and the                         sedation options and risks were discussed with the                         patient. All questions were answered and informed                         consent was obtained.                        - ASA Grade Assessment: II - A patient with mild                         systemic disease.                        After obtaining informed consent, the colonoscope was                         passed under direct vision. Throughout the procedure,                         the patient's blood  pressure, pulse, and oxygen                         saturations were monitored continuously. The                         Colonoscope was introduced through the anus and                         advanced to the the ileocolonic anastomosis. The                          colonoscopy was performed with ease. The patient                         tolerated the procedure well. The quality of the bowel                         preparation was [Prep Quality]. Findings:      The perianal and digital rectal examinations were normal.      There was evidence of a prior end-to-end ileo-colonic anastomosis in the       cecum. This was patent and was characterized by healthy appearing       mucosa. The anastomosis was traversed.      The exam was otherwise without abnormality on direct and retroflexion       views. Impression:            - Patent end-to-end ileo-colonic anastomosis,                         characterized by healthy appearing mucosa.                        - The examination was otherwise normal on direct and                         retroflexion views.                        - No specimens collected. Recommendation:        - Discharge patient to home (with escort).                        - Resume previous diet.                        - Continue present medications.                        - Repeat colonoscopy in 5 years for surveillance. Procedure Code(s):     --- Professional ---                        [phone], Colonoscopy, flexible; diagnostic, including                         collection of specimen(s) by brushing or washing, when                         performed (separate procedure) Diagnosis Code(s):     ---  Professional ---                        Z83.71, Family history of colonic polyps                        Z98.0, Intestinal bypass and anastomosis status CPT copyright 2022 [employer]. All rights reserved. The codes documented in this report are preliminary and upon coder review may  be revised to meet current compliance requirements. [name], MD [name] MD, MD 02/09/2024 9:20:30 AM This report has been signed electronically. Number of Addenda: 0 Note Initiated On: 02/09/2024 8:42 AM Scope Withdrawal Time: 0 hours 11  minutes 40 seconds  Total Procedure Duration: 0 hours 15 minutes 40 seconds  Estimated Blood Loss:  Estimated blood loss: none.      [facility]

## 2024-02-09 NOTE — H&P (Signed)
 "                                                                                                                           [name] , MD [address], [address], [place], [place], [address] Phone: [phone] Fax: [phone]  Primary Care Physician:  Dineen Channel, PA-C   Pre-Procedure History & Physical: HPI:  Robert Franklin is a 59 y.o. male is here for an colonoscopy.   Past Medical History:  Diagnosis Date   Coronary artery disease, non-occlusive 02/28/2008   Dr. Fernand @ [facility]: 2 x 40-50% lesions in dominant LCx lesion by cath,  EF 60% with normal wall motion.;  Follow catheterization January 2021 showed stable findings.  LCx lesions likely not stenoses, more normal diameter vessel in setting of diffuse ectasia/aneurysm.   Diabetes mellitus without complication (HCC)    Essential hypertension    GERD (gastroesophageal reflux disease)    Headache    High triglycerides     was previously on treatment, but  he notes that this was stopped   Kidney stones    Moderate obesity 05/29/2014   OSA treated with BiPAP    Short bowel syndrome 1994    Past Surgical History:  Procedure Laterality Date   stomach wrap  1991   GERD   APPENDECTOMY     CARDIAC CATHETERIZATION  02/2008   Dr. Fernand @ [facility]: 2 x 40-50% lesions in a dominant LCx by cath,  EF 60% with normal wall motion.   Cardiolite Nuclear Stress Test  01/2008    heart rate increased from 80-158 BPM. No EKG changes.  there is a moderate sized  reversible defect in the inferior and inferior lateral wall as well as anteroseptal. EF 59%.   CardioNet Holter Monitor  01/2008    sinus rhythm. Rare PVCs in setting was. Current PACs , seen in singles and pairs. Max heart rate 127, minimum heart rate 57. Average 84.   CHOLECYSTECTOMY     COLON SURGERY     due to gangrenous appendix   COLONOSCOPY WITH PROPOFOL  N/A 04/26/2016   Procedure: COLONOSCOPY WITH PROPOFOL ;  Surgeon: [name], MD;  Location: [facility] ENDOSCOPY;   Service: Endoscopy;  Laterality: N/A;   ESOPHAGOGASTRODUODENOSCOPY (EGD) WITH PROPOFOL  N/A 01/11/2019   Procedure: ESOPHAGOGASTRODUODENOSCOPY (EGD) WITH PROPOFOL ;  Surgeon: Kung Ruel, MD;  Location: [facility] ENDOSCOPY;  Service: Gastroenterology;  Laterality: N/A;   FLEXIBLE SIGMOIDOSCOPY N/A 01/11/2019   Procedure: FLEXIBLE SIGMOIDOSCOPY;  Surgeon: Kung Ruel, MD;  Location: [facility] ENDOSCOPY;  Service: Gastroenterology;  Laterality: N/A;   HERNIA REPAIR  2010   INNER EAR SURGERY     LEFT HEART CATH AND CORONARY ANGIOGRAPHY N/A 02/12/2019   Procedure: LEFT HEART CATH AND CORONARY ANGIOGRAPHY;  Surgeon: Anner Alm ORN, MD;  Location: [facility] INVASIVE CV LAB;  Service: Cardiovascular; Proximal RCA 40%.  Diffuse aneurysmal/ectatic (patulous) dominant LCx with no significant lesions.  Questionable 40 to 50% stenosis-most likely actually just normal  diameter with surrounding ectasia/aneurysmal segments.  EF 55 to 60%.  Normal EDP.   NM MYOVIEW  LTD  01/10/2019   Treadmill Myoview : Walk 9:40 min-10.1 METS) EF ~45 to 50%.  INTERMEDIATE RISK STUDY-moderate sized mostly fixed perfusion defect in anterior apical segment.  No reversibility.  Suggests prior infarct without ischemia.   TONSILLECTOMY     TYMPANOSTOMY TUBE PLACEMENT Bilateral 1973    Prior to Admission medications  Medication Sig Start Date End Date Taking? Authorizing Provider  Baclofen  5 MG TABS Take 1 tablet (5 mg total) by mouth at bedtime as needed. 10/21/23  Yes White, Adrienne R, NP  BIOTIN PO Take 10,000 mcg by mouth 2 (two) times daily.   Yes [provider]  bismuth subsalicylate (PEPTO BISMOL) 262 MG/15ML suspension Take 30 mLs by mouth every 6 (six) hours as needed for indigestion.   Yes [provider]  butalbital -acetaminophen -caffeine  (FIORICET) 50-325-40 MG tablet Take 1 tablet by mouth 2 (two) times daily as needed for headache. 10/05/23  Yes Ostwalt, Janna, PA-C  Charcoal Activated (ACTIVATED CHARCOAL PO) Take 260 mg  by mouth 2 (two) times daily.   Yes [provider]  CINNAMON PO Take 1,200 mg by mouth daily.   Yes [provider]  Continuous Glucose Sensor (FREESTYLE LIBRE 3 PLUS SENSOR) MISC Change sensor every 15 days. 11/10/23  Yes Ostwalt, Janna, PA-C  ezetimibe  (ZETIA ) 10 MG tablet Take 1 tablet (10 mg total) by mouth daily. 10/05/23  Yes Ostwalt, Janna, PA-C  FARXIGA  10 MG TABS tablet TAKE 1 TABLET BY MOUTH DAILY BEFORE BREAKFAST. 02/01/24  Yes Anner Alm ORN, MD  furosemide  (LASIX ) 20 MG tablet TAKE 1 TABLET DAILY AS NEEDED (FOR WEIGHT GAIN OF 3 POUNDS OVERNIGHT OR 5 POUNDS IN A WEEK). 03/01/23  Yes Wittenborn, Barnie, NP  ibuprofen  (ADVIL ) 800 MG tablet Take 1 tablet (800 mg total) by mouth every 8 (eight) hours as needed (pain). 01/25/22  Yes Vonna Sharlet POUR, MD  irbesartan  (AVAPRO ) 150 MG tablet TAKE 1 TABLET BY MOUTH EVERY DAY 03/08/23  Yes Anner Alm ORN, MD  magnesium  gluconate (MAGONATE) 500 MG tablet Take 500 mg by mouth daily.   Yes [provider]  metoprolol  succinate (TOPROL -XL) 50 MG 24 hr tablet TAKE 1 TABLET BY MOUTH DAILY. TAKE WITH OR IMMEDIATELY FOLLOWING A MEAL. 10/03/22  Yes Anner Alm ORN, MD  ondansetron  (ZOFRAN -ODT) 4 MG disintegrating tablet Take 1 tablet (4 mg total) by mouth every 8 (eight) hours as needed for nausea or vomiting. 02/09/22  Yes Corlis Burnard DEL, NP  Potassium 99 MG TABS Take 99 mg by mouth daily.   Yes [provider]  Probiotic Product (ALIGN) 4 MG CAPS Take 4 mg by mouth daily.   Yes [provider]  Saw Palmetto, Serenoa repens, (SAW PALMETTO PO) Take 1,400 mg by mouth daily.   Yes [provider]  Semaglutide  (RYBELSUS ) 3 MG TABS Take 1 tablet (3 mg total) by mouth daily. 11/16/23  Yes Ostwalt, Janna, PA-C  TURMERIC PO Take 2,600 mg by mouth 2 (two) times daily.   Yes [provider]  VASCEPA  1 g capsule Take 2 capsules (2 g total) by mouth 2 (two) times daily. 10/12/23  Yes Ostwalt, Janna, PA-C   escitalopram  (LEXAPRO ) 10 MG tablet TAKE 1 TABLET BY MOUTH EVERY DAY Patient not taking: Reported on 11/22/2023 08/14/23   Ostwalt, Janna, PA-C  omeprazole  (PRILOSEC  OTC) 20 MG tablet Take 1 tablet (20 mg total) by mouth daily. Patient taking differently: Take  20 mg by mouth daily as needed. 05/14/15   Bertrum Charlie CROME, MD  tadalafil  (CIALIS ) 10 MG tablet Take 1 tablet (10 mg total) by mouth daily. Ok to take additional 10mg  1 hour prior to sexual activity 10/05/23   Francisca Redell BROCKS, MD  tadalafil  (CIALIS ) 20 MG tablet TAKE 1 TABLET (20 MG TOTAL) BY MOUTH EVERY THREE (3) DAYS AS NEEDED FOR ERECTILE DYSFUNCTION. 08/24/23   Ostwalt, Janna, PA-C    Allergies as of 01/09/2024   (No Known Allergies)    Family History  Problem Relation Age of Onset   Hypertension Mother    Irritable bowel syndrome Mother    Migraines Mother    Lumbar disc disease Mother    Heart attack Mother    Heart attack Maternal Uncle    Heart attack Maternal Grandfather     Social History   Socioeconomic History   Marital status: Married    Spouse name: Beverley   Number of children: 0   Years of education: 14   Highest education level: Associate degree: occupational, scientist, product/process development, or vocational program  Occupational History   Occupation: [employer] social research officer  Tobacco Use   Smoking status: Former    Current packs/day: 0.00    Average packs/day: 1 pack/day for 28.0 years (28.0 ttl pk-yrs)    Types: Cigarettes    Start date: 12/30/1977    Quit date: 12/30/2005    Years since quitting: 18.1   Smokeless tobacco: Never  Vaping Use   Vaping status: Never Used  Substance and Sexual Activity   Alcohol use: Yes    Comment: occ   Drug use: Not Currently   Sexual activity: Not Currently  Other Topics Concern   Not on file  Social History Narrative    He is essentially the primary caregiver for his mom -  Who had an MI following back surgery in the Spring of 2016.   Caffeine  use: once or twice a week   Lives with wife,  mother and grandmother   Social Drivers of Health   Tobacco Use: Medium Risk (12/18/2023)   Patient History    Smoking Tobacco Use: Former    Smokeless Tobacco Use: Never    Passive Exposure: Not on Actuary Strain: Low Risk  (01/09/2024)   Received from [facility]   Overall Financial Resource Strain (CARDIA)    Difficulty of Paying Living Expenses: Not hard at all  Food Insecurity: No Food Insecurity (01/09/2024)   Received from [facility]   Epic    Within the past 12 months, you worried that your food would run out before you got the money to buy more.: Never true    Within the past 12 months, the food you bought just didn't last and you didn't have money to get more.: Never true  Transportation Needs: No Transportation Needs (01/09/2024)   Received from [facility] - Transportation    In the past 12 months, has lack of transportation kept you from medical appointments or from getting medications?: No    Lack of Transportation (Non-Medical): No  Physical Activity: Inactive (11/16/2023)   Exercise Vital Sign    Days of Exercise per Week: 0 days    Minutes of Exercise per Session: Not on file  Stress: No Stress Concern Present (11/16/2023)   [employer] - Occupational Stress Questionnaire    Feeling of Stress: Not at all  Social Connections: Moderately Isolated (  11/16/2023)   Social Connection and Isolation Panel    Frequency of Communication with Friends and Family: More than three times a week    Frequency of Social Gatherings with Friends and Family: Once a week    Attends Religious Services: Never    Database Administrator or Organizations: No    Attends Engineer, Structural: Not on file    Marital Status: Married  Catering Manager Violence: Not on file  Depression (PHQ2-9): Low Risk (08/30/2023)   Depression (PHQ2-9)    PHQ-2 Score: 0  Alcohol Screen:  Low Risk (11/16/2023)   Alcohol Screen    Last Alcohol Screening Score (AUDIT): 0  Housing: Low Risk  (01/09/2024)   Received from [facility]   Epic    In the last 12 months, was there a time when you were not able to pay the mortgage or rent on time?: No    In the past 12 months, how many times have you moved where you were living?: 0    At any time in the past 12 months, were you homeless or living in a shelter (including now)?: No  Utilities: Not At Risk (01/09/2024)   Received from [facility]   Epic    In the past 12 months has the electric, gas, oil, or water company threatened to shut off services in your home?: No  Health Literacy: Adequate Health Literacy (11/16/2023)   B1300 Health Literacy    Frequency of need for help with medical instructions: Never    Review of Systems: See HPI, otherwise negative ROS  Physical Exam: BP (!) 141/91   Pulse 73   Temp (!) 96.1 F (35.6 C) (Temporal)   Resp 20   Ht 5' 9 (1.753 m)   Wt 96.8 kg   SpO2 98%   BMI 31.51 kg/m  General:   Alert,  pleasant and cooperative in NAD Head:  Normocephalic and atraumatic. Neck:  Supple; no masses or thyromegaly. Lungs:  Clear throughout to auscultation, normal respiratory effort.    Heart:  +S1, +S2, Regular rate and rhythm, No edema. Abdomen:  Soft, nontender and nondistended. Normal bowel sounds, without guarding, and without rebound.   Neurologic:  Alert and  oriented x4;  grossly normal neurologically.  Impression/Plan: Robert Franklin is here for an colonoscopy to be performed for Screening colonoscopy , family history of colon polyps Risks, benefits, limitations, and alternatives regarding  colonoscopy have been reviewed with the patient.  Questions have been answered.  All parties agreeable.   [name], MD  02/09/2024, 8:47 AM  "

## 2024-02-09 NOTE — Anesthesia Preprocedure Evaluation (Signed)
 "                                  Anesthesia Evaluation  Patient identified by MRN, date of birth, ID band Patient awake    Reviewed: Allergy & Precautions, NPO status , Patient's Chart, lab work & pertinent test results  Airway Mallampati: II  TM Distance: >3 FB Neck ROM: Full    Dental  (+) Teeth Intact   Pulmonary neg pulmonary ROS, sleep apnea , former smoker   Pulmonary exam normal  + decreased breath sounds      Cardiovascular Exercise Tolerance: Good hypertension, Pt. on medications + CAD  negative cardio ROS Normal cardiovascular exam Rhythm:Regular     Neuro/Psych  Headaches negative neurological ROS  negative psych ROS   GI/Hepatic negative GI ROS, Neg liver ROS, PUD,GERD  ,,  Endo/Other  negative endocrine ROSdiabetes, Type 2, Oral Hypoglycemic Agents    Renal/GU      Musculoskeletal negative musculoskeletal ROS (+)    Abdominal Normal abdominal exam  (+)   Peds negative pediatric ROS (+)  Hematology negative hematology ROS (+)   Anesthesia Other Findings Past Medical History: 02/28/2008: Coronary artery disease, non-occlusive     Comment:  Dr. Fernand @ [facility]: 2 x 40-50% lesions in dominant LCx               lesion by cath,  EF 60% with normal wall motion.;  Follow              catheterization January 2021 showed stable findings.  LCx              lesions likely not stenoses, more normal diameter vessel               in setting of diffuse ectasia/aneurysm. No date: Diabetes mellitus without complication (HCC) No date: Essential hypertension No date: GERD (gastroesophageal reflux disease) No date: Headache No date: High triglycerides     Comment:   was previously on treatment, but  he notes that this               was stopped No date: Kidney stones 05/29/2014: Moderate obesity No date: OSA treated with BiPAP 1994: Short bowel syndrome  Past Surgical History: 1991: stomach wrap     Comment:  GERD No date:  APPENDECTOMY 02/2008: CARDIAC CATHETERIZATION     Comment:  Dr. Fernand @ [facility]: 2 x 40-50% lesions in a dominant LCx by              cath,  EF 60% with normal wall motion. 01/2008: Cardiolite Nuclear Stress Test     Comment:   heart rate increased from 80-158 BPM. No EKG changes.                there is a moderate sized  reversible defect in the               inferior and inferior lateral wall as well as               anteroseptal. EF 59%. 01/2008: CardioNet Holter Monitor     Comment:   sinus rhythm. Rare PVCs in setting was. Current PACs ,               seen in singles and pairs. Max heart rate 127, minimum               heart  rate 57. Average 84. No date: CHOLECYSTECTOMY No date: COLON SURGERY     Comment:  due to gangrenous appendix 04/26/2016: COLONOSCOPY WITH PROPOFOL ; N/A     Comment:  Procedure: COLONOSCOPY WITH PROPOFOL ;  Surgeon: [name], MD;  Location: [facility] ENDOSCOPY;  Service: Endoscopy;              Laterality: N/A; 01/11/2019: ESOPHAGOGASTRODUODENOSCOPY (EGD) WITH PROPOFOL ; N/A     Comment:  Procedure: ESOPHAGOGASTRODUODENOSCOPY (EGD) WITH               PROPOFOL ;  Surgeon: Kung Ruel, MD;  Location: [facility]               ENDOSCOPY;  Service: Gastroenterology;  Laterality: N/A; 01/11/2019: FLEXIBLE SIGMOIDOSCOPY; N/A     Comment:  Procedure: FLEXIBLE SIGMOIDOSCOPY;  Surgeon: Kung Ruel, MD;  Location: [facility] ENDOSCOPY;  Service:               Gastroenterology;  Laterality: N/A; 2010: HERNIA REPAIR No date: INNER EAR SURGERY 02/12/2019: LEFT HEART CATH AND CORONARY ANGIOGRAPHY; N/A     Comment:  Procedure: LEFT HEART CATH AND CORONARY ANGIOGRAPHY;                Surgeon: Anner Alm ORN, MD;  Location: [facility] INVASIVE CV               LAB;  Service: Cardiovascular; Proximal RCA 40%.  Diffuse              aneurysmal/ectatic (patulous) dominant LCx with no               significant lesions.  Questionable 40 to 50%               stenosis-most likely  actually just normal diameter with               surrounding ectasia/aneurysmal segments.  EF 55 to 60%.                Normal EDP. 01/10/2019: NM MYOVIEW  LTD     Comment:  Treadmill Myoview : Walk 9:40 min-10.1 METS) EF ~45 to               50%.  INTERMEDIATE RISK STUDY-moderate sized mostly fixed              perfusion defect in anterior apical segment.  No               reversibility.  Suggests prior infarct without ischemia. No date: TONSILLECTOMY 1973: TYMPANOSTOMY TUBE PLACEMENT; Bilateral  BMI    Body Mass Index: 31.51 kg/m      Reproductive/Obstetrics negative OB ROS                              Anesthesia Physical Anesthesia Plan  ASA: 3  Anesthesia Plan: General   Post-op Pain Management:    Induction:   PONV Risk Score and Plan: Ondansetron , Dexamethasone, Midazolam  and Treatment may vary due to age or medical condition  Airway Management Planned: Oral ETT  Additional Equipment:   Intra-op Plan:   Post-operative Plan: Extubation in OR  Informed Consent: I have reviewed the patients History and Physical, chart, labs and discussed the procedure including the risks, benefits and alternatives for the proposed anesthesia with the patient  or authorized representative who has indicated his/her understanding and acceptance.     Dental Advisory Given  Plan Discussed with: CRNA  Anesthesia Plan Comments:         Anesthesia Quick Evaluation  "

## 2024-02-09 NOTE — Anesthesia Postprocedure Evaluation (Signed)
"   Anesthesia Post Note  Patient: Robert Franklin  Procedure(s) Performed: COLONOSCOPY  Patient location during evaluation: PACU Anesthesia Type: General Level of consciousness: awake and alert Pain management: satisfactory to patient Vital Signs Assessment: post-procedure vital signs reviewed and stable Respiratory status: nonlabored ventilation Cardiovascular status: stable Anesthetic complications: no   No notable events documented.   Last Vitals:  Vitals:   02/09/24 0945 02/09/24 0950  BP: (!) 138/123 (!) 147/99  Pulse: 84 79  Resp: 17 17  Temp:    SpO2: 98% 97%    Last Pain:  Vitals:   02/09/24 0945  TempSrc:   PainSc: 0-No pain                 VAN STAVEREN,Robert Franklin      "

## 2024-02-09 NOTE — Transfer of Care (Signed)
 Immediate Anesthesia Transfer of Care Note  Patient: Robert Franklin  Procedure(s) Performed: COLONOSCOPY  Patient Location: PACU  Anesthesia Type:General  Level of Consciousness: awake and alert   Airway & Oxygen Therapy: Patient Spontanous Breathing  Post-op Assessment: Report given to RN and Post -op Vital signs reviewed and stable  Post vital signs: Reviewed and stable  Last Vitals:  Vitals Value Taken Time  BP 147/88 02/09/24 09:25  Temp    Pulse 87 02/09/24 09:26  Resp 21 02/09/24 09:26  SpO2 98 % 02/09/24 09:26  Vitals shown include unfiled device data.  Last Pain:  Vitals:   02/09/24 0925  TempSrc: Temporal         Complications: No notable events documented.

## 2024-02-09 NOTE — Anesthesia Procedure Notes (Signed)
 Procedure Name: Intubation Date/Time: 02/09/2024 8:53 AM  Performed by: Duwayne Craven, CRNAPre-anesthesia Checklist: Patient identified, Patient being monitored, Timeout performed, Emergency Drugs available and Suction available Patient Re-evaluated:Patient Re-evaluated prior to induction Oxygen Delivery Method: Circle system utilized Preoxygenation: Pre-oxygenation with 100% oxygen Induction Type: IV induction, Cricoid Pressure applied and Rapid sequence Laryngoscope Size: McGrath and 4 Grade View: Grade I Tube type: Oral Tube size: 7.0 mm Number of attempts: 1 Airway Equipment and Method: Stylet Placement Confirmation: ETT inserted through vocal cords under direct vision, positive ETCO2 and breath sounds checked- equal and bilateral Secured at: 22 cm Tube secured with: Tape Dental Injury: Teeth and Oropharynx as per pre-operative assessment

## 2024-02-11 NOTE — Progress Notes (Addendum)
 "  [facility]  PATIENT: Robert Franklin DOB: [birth year]  REFERRING DOCTOR OR PCP: Lamar Forte, MD SOURCE: Patient, notes from primary care, imaging reports and images  _________________________________   HISTORICAL  CHIEF COMPLAINT:  Chief Complaint  Patient presents with   New Patient (Initial Visit)    Room 11 Alone neuropathy and migraines    HISTORY OF PRESENT ILLNESS:  I had the pleasure of seeing your patient, Robert Franklin, at [facility] for neurologic consultation regarding his headaches and right arm weakness/pain.  He is a 59 yo man who has reports right arm pain since October 2025 and migraines that intensified in late 2025  Migraines are occurring almost every day.  HA pain sometimes resolves at night but some nights intensified.   In general the HA intensifies as the day goes on.  It is associated with Nausea but not vomiting.   He usually notes pain starts in the neck and radiates up into the head or into the shoulder.     Cervical spine Xray showed osteophytes C3-C4 to C6-C7.   Hand xray was normal.   When HA occurs, a cold gel pack helps.   Heat actually sometimes helps but less than cold.   He has tried a herbal remedy with occasional benefit also has taken .  Fioricet has helped but he was advised to avoid on frequent basis   He has not been taking any preventatives.   In 2021, we had done a splenius capitus TPI/occipital nerve block with benefit.  In October 2025, he was doing a lot of physical tasks and had the onset of severe digit 2 weakness and lesser arm weakness.also associated with pain.   He saw orthopedics and he was told he had a torn tendon.  When he extends the finger he notes pain running up into his neck.   He notes weakness with reduced grip and reduced triceps/biceps strength.  He has pain in his elbow when he moves.    He denies change in his  bladder since the pain started with persistent urinary urgency  (felt due to his DM).  He has noticed more leg cramps since symptoms started.  He has noted reduced ROM in his neck, a little worse since October.     Migraine Hx I saw him in 2021:   He has a history of intermittent migraine headaches occurring 2 times a week since childhood.   He has a strong FH of migraines in last 4 generations.    When a HA is more severe he gets nausea, vomiting, photophobia > phonophobia.  Moving his head during a migraine makes him dizzy.   He tries to take an Excedrin migraine and other times takes a Fioricet.   Fioricet makes him feel like he is functioning worse.  He used totake 30 tablets over a year but now is taking them more regularly.   He has never been on a migraine preventative but is on metoprolol  for his heart.    He is also having having vertigo.  If this occurs while walking he veers to one side or other.   He has a rotatory vertigo when he bends over at times  He notes neck pain that radiates to the occiput and higher.   That pain is mostly mild in intensity but occurs 24/7 with fluctuations in intensity.   He takes Tylenol  or Aleve  prn but has never been on a daily preventative for this pain.  He has a history of chronic ear infections and regularly sees ENT.   He needs to try to avoid water in his ears.   He had a cardiac stress test that was abnormal so he had a diagnostic cardiac catheterization study.      He has Type 2 DM, essential hypertension and hyperlipidemia.   He was on CPAP but stopped after losing 50 pounds.  Imaging: MRI of the head 05/08/2019 was normal  REVIEW OF SYSTEMS: Constitutional: No fevers, chills, sweats, or change in appetite Eyes: No visual changes, double vision, eye pain Ear, nose and throat: No hearing loss, ear pain, nasal congestion, sore throat Cardiovascular: No chest pain, palpitations.  In the past he had angina but he reports the catheterization did not show any significant Respiratory:  No shortness of breath at rest or  with exertion.   He has OSA and uses CPAP.   GastrointestinaI: He has irritable bowel  genitourinary:  No dysuria, urinary retention or frequency.  No nocturia. Has ED Musculoskeletal:  No neck pain, back pain Integumentary: No rash, pruritus, skin lesions Neurological: as above Psychiatric: No depression at this time.  No anxiety Endocrine: No palpitations, diaphoresis, change in appetite, change in weigh or increased thirst Hematologic/Lymphatic:  No anemia, purpura, petechiae. Allergic/Immunologic: No itchy/runny eyes, nasal congestion, recent allergic reactions, rashes  ALLERGIES: No Known Allergies   HOME MEDICATIONS:  Current Outpatient Medications:    Baclofen  5 MG TABS, Take 1 tablet (5 mg total) by mouth at bedtime as needed., Disp: 10 tablet, Rfl: 0   BIOTIN PO, Take 10,000 mcg by mouth 2 (two) times daily., Disp: , Rfl:    bismuth subsalicylate (PEPTO BISMOL) 262 MG/15ML suspension, Take 30 mLs by mouth every 6 (six) hours as needed for indigestion., Disp: , Rfl:    butalbital -acetaminophen -caffeine  (FIORICET) 50-325-40 MG tablet, Take 1 tablet by mouth 2 (two) times daily as needed for headache., Disp: 30 tablet, Rfl: 0   Charcoal Activated (ACTIVATED CHARCOAL PO), Take 260 mg by mouth 2 (two) times daily., Disp: , Rfl:    CINNAMON PO, Take 1,200 mg by mouth daily., Disp: , Rfl:    Continuous Glucose Sensor (FREESTYLE LIBRE 3 PLUS SENSOR) MISC, Change sensor every 15 days., Disp: 6 each, Rfl: 2   escitalopram  (LEXAPRO ) 10 MG tablet, TAKE 1 TABLET BY MOUTH EVERY DAY, Disp: 90 tablet, Rfl: 1   ezetimibe  (ZETIA ) 10 MG tablet, Take 1 tablet (10 mg total) by mouth daily., Disp: 90 tablet, Rfl: 3   FARXIGA  10 MG TABS tablet, TAKE 1 TABLET BY MOUTH DAILY BEFORE BREAKFAST., Disp: 90 tablet, Rfl: 3   furosemide  (LASIX ) 20 MG tablet, TAKE 1 TABLET DAILY AS NEEDED (FOR WEIGHT GAIN OF 3 POUNDS OVERNIGHT OR 5 POUNDS IN A WEEK)., Disp: 90 tablet, Rfl: 1   ibuprofen  (ADVIL ) 800 MG tablet,  Take 1 tablet (800 mg total) by mouth every 8 (eight) hours as needed (pain)., Disp: 21 tablet, Rfl: 0   imipramine  (TOFRANIL ) 25 MG tablet, Take 1 tablet (25 mg total) by mouth at bedtime., Disp: 30 tablet, Rfl: 11   irbesartan  (AVAPRO ) 150 MG tablet, TAKE 1 TABLET BY MOUTH EVERY DAY, Disp: 90 tablet, Rfl: 3   magnesium  gluconate (MAGONATE) 500 MG tablet, Take 500 mg by mouth daily., Disp: , Rfl:    metoprolol  succinate (TOPROL -XL) 50 MG 24 hr tablet, TAKE 1 TABLET BY MOUTH DAILY. TAKE WITH OR IMMEDIATELY FOLLOWING A MEAL., Disp: 90 tablet, Rfl: 3   omeprazole  (PRILOSEC  OTC)  20 MG tablet, Take 1 tablet (20 mg total) by mouth daily. (Patient taking differently: Take 20 mg by mouth daily as needed.), Disp: 30 tablet, Rfl: 12   ondansetron  (ZOFRAN -ODT) 4 MG disintegrating tablet, Take 1 tablet (4 mg total) by mouth every 8 (eight) hours as needed for nausea or vomiting., Disp: 20 tablet, Rfl: 0   Potassium 99 MG TABS, Take 99 mg by mouth daily., Disp: , Rfl:    Probiotic Product (ALIGN) 4 MG CAPS, Take 4 mg by mouth daily., Disp: , Rfl:    Saw Palmetto, Serenoa repens, (SAW PALMETTO PO), Take 1,400 mg by mouth daily., Disp: , Rfl:    Semaglutide  (RYBELSUS ) 3 MG TABS, Take 1 tablet (3 mg total) by mouth daily., Disp: 30 tablet, Rfl: 1   tadalafil  (CIALIS ) 10 MG tablet, Take 1 tablet (10 mg total) by mouth daily. Ok to take additional 10mg  1 hour prior to sexual activity, Disp: 90 tablet, Rfl: 3   TURMERIC PO, Take 2,600 mg by mouth 2 (two) times daily., Disp: , Rfl:    VASCEPA  1 g capsule, Take 2 capsules (2 g total) by mouth 2 (two) times daily., Disp: 360 capsule, Rfl: 1   tadalafil  (CIALIS ) 20 MG tablet, TAKE 1 TABLET (20 MG TOTAL) BY MOUTH EVERY THREE (3) DAYS AS NEEDED FOR ERECTILE DYSFUNCTION. (Patient not taking: Reported on 02/13/2024), Disp: 4 tablet, Rfl: 1  PAST MEDICAL HISTORY: Past Medical History:  Diagnosis Date   Coronary artery disease, non-occlusive 02/28/2008   Dr. Fernand @ [facility]: 2 x  40-50% lesions in dominant LCx lesion by cath,  EF 60% with normal wall motion.;  Follow catheterization January 2021 showed stable findings.  LCx lesions likely not stenoses, more normal diameter vessel in setting of diffuse ectasia/aneurysm.   Diabetes mellitus without complication (HCC)    Essential hypertension    GERD (gastroesophageal reflux disease)    Headache    High triglycerides     was previously on treatment, but  he notes that this was stopped   Kidney stones    Moderate obesity 05/29/2014   OSA treated with BiPAP    Short bowel syndrome 1994    PAST SURGICAL HISTORY: Past Surgical History:  Procedure Laterality Date   stomach wrap  1991   GERD   APPENDECTOMY     CARDIAC CATHETERIZATION  02/2008   Dr. Fernand @ [facility]: 2 x 40-50% lesions in a dominant LCx by cath,  EF 60% with normal wall motion.   Cardiolite Nuclear Stress Test  01/2008    heart rate increased from 80-158 BPM. No EKG changes.  there is a moderate sized  reversible defect in the inferior and inferior lateral wall as well as anteroseptal. EF 59%.   CardioNet Holter Monitor  01/2008    sinus rhythm. Rare PVCs in setting was. Current PACs , seen in singles and pairs. Max heart rate 127, minimum heart rate 57. Average 84.   CHOLECYSTECTOMY     COLON SURGERY     due to gangrenous appendix   COLONOSCOPY N/A 02/09/2024   Procedure: COLONOSCOPY;  Surgeon: Therisa Bi, MD;  Location: [facility] ENDOSCOPY;  Service: Gastroenterology;  Laterality: N/A;   COLONOSCOPY WITH PROPOFOL  N/A 04/26/2016   Procedure: COLONOSCOPY WITH PROPOFOL ;  Surgeon: Bi Therisa, MD;  Location: [facility] ENDOSCOPY;  Service: Endoscopy;  Laterality: N/A;   ESOPHAGOGASTRODUODENOSCOPY (EGD) WITH PROPOFOL  N/A 01/11/2019   Procedure: ESOPHAGOGASTRODUODENOSCOPY (EGD) WITH PROPOFOL ;  Surgeon: Therisa Bi, MD;  Location: [facility] ENDOSCOPY;  Service: Gastroenterology;  Laterality: N/A;   FLEXIBLE SIGMOIDOSCOPY N/A 01/11/2019   Procedure: FLEXIBLE SIGMOIDOSCOPY;   Surgeon: Therisa Bi, MD;  Location: [facility] ENDOSCOPY;  Service: Gastroenterology;  Laterality: N/A;   HERNIA REPAIR  2010   INNER EAR SURGERY     LEFT HEART CATH AND CORONARY ANGIOGRAPHY N/A 02/12/2019   Procedure: LEFT HEART CATH AND CORONARY ANGIOGRAPHY;  Surgeon: Anner Alm ORN, MD;  Location: [facility] INVASIVE CV LAB;  Service: Cardiovascular; Proximal RCA 40%.  Diffuse aneurysmal/ectatic (patulous) dominant LCx with no significant lesions.  Questionable 40 to 50% stenosis-most likely actually just normal diameter with surrounding ectasia/aneurysmal segments.  EF 55 to 60%.  Normal EDP.   NM MYOVIEW  LTD  01/10/2019   Treadmill Myoview : Walk 9:40 min-10.1 METS) EF ~45 to 50%.  INTERMEDIATE RISK STUDY-moderate sized mostly fixed perfusion defect in anterior apical segment.  No reversibility.  Suggests prior infarct without ischemia.   TONSILLECTOMY     TYMPANOSTOMY TUBE PLACEMENT Bilateral 1973    FAMILY HISTORY: Family History  Problem Relation Age of Onset   Hypertension Mother    Irritable bowel syndrome Mother    Migraines Mother    Lumbar disc disease Mother    Heart attack Mother    Heart attack Maternal Uncle    Heart attack Maternal Grandfather     SOCIAL HISTORY:  Social History   Socioeconomic History   Marital status: Married    Spouse name: Robert Franklin   Number of children: 0   Years of education: 14   Highest education level: Associate degree: occupational, scientist, product/process development, or vocational program  Occupational History   Occupation: [employer] social research officer  Tobacco Use   Smoking status: Former    Current packs/day: 0.00    Average packs/day: 1 pack/day for 28.0 years (28.0 ttl pk-yrs)    Types: Cigarettes    Start date: 12/30/1977    Quit date: 12/30/2005    Years since quitting: 18.1   Smokeless tobacco: Never  Vaping Use   Vaping status: Never Used  Substance and Sexual Activity   Alcohol use: Yes    Comment: occ   Drug use: Not Currently   Sexual activity: Not Currently  Other  Topics Concern   Not on file  Social History Narrative    He is essentially the primary caregiver for his mom -  Who had an MI following back surgery in the Spring of 2016.   Caffeine  use: once or twice a week   Lives with wife, mother and grandmother   Social Drivers of Health   Tobacco Use: Medium Risk (02/13/2024)   Patient History    Smoking Tobacco Use: Former    Smokeless Tobacco Use: Never    Passive Exposure: Not on Actuary Strain: Low Risk  (01/09/2024)   Received from [facility]   Overall Financial Resource Strain (CARDIA)    Difficulty of Paying Living Expenses: Not hard at all  Food Insecurity: No Food Insecurity (01/09/2024)   Received from [facility]   Epic    Within the past 12 months, you worried that your food would run out before you got the money to buy more.: Never true    Within the past 12 months, the food you bought just didn't last and you didn't have money to get more.: Never true  Transportation Needs: No Transportation Needs (01/09/2024)   Received from [facility] - Transportation    In the past 12 months, has lack of  transportation kept you from medical appointments or from getting medications?: No    Lack of Transportation (Non-Medical): No  Physical Activity: Inactive (11/16/2023)   Exercise Vital Sign    Days of Exercise per Week: 0 days    Minutes of Exercise per Session: Not on file  Stress: No Stress Concern Present (11/16/2023)   [employer] of Occupational Health - Occupational Stress Questionnaire    Feeling of Stress: Not at all  Social Connections: Moderately Isolated (11/16/2023)   Social Connection and Isolation Panel    Frequency of Communication with Friends and Family: More than three times a week    Frequency of Social Gatherings with Friends and Family: Once a week    Attends Religious Services: Never    Database Administrator or Organizations:  No    Attends Engineer, Structural: Not on file    Marital Status: Married  Catering Manager Violence: Not on file  Depression (PHQ2-9): Low Risk (08/30/2023)   Depression (PHQ2-9)    PHQ-2 Score: 0  Alcohol Screen: Low Risk (11/16/2023)   Alcohol Screen    Last Alcohol Screening Score (AUDIT): 0  Housing: Low Risk  (01/09/2024)   Received from [facility]   Epic    In the last 12 months, was there a time when you were not able to pay the mortgage or rent on time?: No    In the past 12 months, how many times have you moved where you were living?: 0    At any time in the past 12 months, were you homeless or living in a shelter (including now)?: No  Utilities: Not At Risk (01/09/2024)   Received from [facility]   Epic    In the past 12 months has the electric, gas, oil, or water company threatened to shut off services in your home?: No  Health Literacy: Adequate Health Literacy (11/16/2023)   B1300 Health Literacy    Frequency of need for help with medical instructions: Never     PHYSICAL EXAM  Vitals:   02/13/24 1413  BP: (!) 147/93  Pulse: 76  SpO2: 98%  Weight: 216 lb 8 oz (98.2 kg)  Height: 5' 9 (1.753 m)     Body mass index is 31.97 kg/m.   General: The patient is well-developed and well-nourished and in no acute distress  HEENT:  Head is [place]/AT.  Sclera are anicteric.  Funduscopic exam shows normal optic discs and retinal vessels.  Neck: No carotid bruits are noted.  The neck is tender over the splenius capitis/occipital nerves, left greater than right.  Milder tenderness over the paraspinal muscles..  Range of motion was slightly reduced..  Cardiovascular: The heart has a regular rate and rhythm with a normal S1 and S2. There were no murmurs, gallops or rubs.    Skin: Extremities are without rash or  edema.  Musculoskeletal:  Back is nontender  Neurologic Exam  Mental status: The patient is alert and oriented x 3  at the time of the examination. The patient has apparent normal recent and remote memory, with an apparently normal attention span and concentration ability.   Speech is normal.  Cranial nerves: Extraocular movements are full. Pupils are equal, round, and reactive to light and accomodation.  Visual fields are full.  Facial symmetry is present. There is good facial sensation to soft touch bilaterally.Facial strength is normal.  Trapezius and sternocleidomastoid strength is normal. No dysarthria is noted.  The tongue is  midline, and the patient has symmetric elevation of the soft palate. No obvious hearing deficits are noted.  Motor:  Muscle bulk is normal.   Tone is normal. Strength is difficult to test in the right arm due to tenderness with certain movements or actions.  In this context, there does appear to be 4+/5 strength in the triceps and pronators and finger extensors.  Additionally there is possible 4+ weakness in the intrinsic hand muscles on the right.  Strength was 5/5 on the left and both legs    Sensory: He had Tinel signs at the right wrist and both elbows.  He had mildly reduced sensation to touch over the hyperthenar eminence of the right hand.  He also had reduced vibration sensation in the toes.  Coordination: Cerebellar testing reveals good finger-nose-finger and heel-to-shin bilaterally.  Gait and station: Station is normal.   Gait is normal. Tandem gait is mildly wide. Romberg is negative.   Reflexes: Deep tendon reflexes are symmetric and normal bilaterally.   Plantar responses are flexor.    DIAGNOSTIC DATA (LABS, IMAGING, TESTING) - I reviewed patient records, labs, notes, testing and imaging myself where available.  Lab Results  Component Value Date   WBC 6.7 10/05/2023   HGB 17.8 (H) 10/05/2023   HCT 52.6 (H) 10/05/2023   MCV 89 10/05/2023   PLT 175 10/05/2023      Component Value Date/Time   NA 140 11/16/2023 1608   NA 135 (L) 01/29/2013 0111   K 4.5  11/16/2023 1608   K 4.1 01/29/2013 0111   CL 100 11/16/2023 1608   CL 103 01/29/2013 0111   CO2 24 11/16/2023 1608   CO2 28 01/29/2013 0111   GLUCOSE 120 (H) 11/16/2023 1608   GLUCOSE 242 (H) 02/02/2023 0945   GLUCOSE 135 (H) 01/29/2013 0111   BUN 24 11/16/2023 1608   BUN 25 (H) 01/29/2013 0111   CREATININE 1.12 11/16/2023 1608   CREATININE 1.01 01/29/2013 0111   CALCIUM  10.2 11/16/2023 1608   CALCIUM  9.2 01/29/2013 0111   PROT 7.7 10/05/2023 1556   ALBUMIN 4.6 10/05/2023 1556   AST 30 10/05/2023 1556   ALT 28 10/05/2023 1556   ALKPHOS 88 10/05/2023 1556   BILITOT 0.8 10/05/2023 1556   GFRNONAA 72 10/10/2019 1433   GFRNONAA >60 01/29/2013 0111   GFRAA 84 10/10/2019 1433   GFRAA >60 01/29/2013 0111   Lab Results  Component Value Date   CHOL 227 (H) 10/05/2023   HDL 30 (L) 10/05/2023   LDLCALC 129 (H) 10/05/2023   LDLDIRECT 46 07/05/2018   TRIG 381 (H) 10/05/2023   CHOLHDL 7.6 (H) 10/05/2023   Lab Results  Component Value Date   HGBA1C 7.3 (H) 07/21/2023   No results found for: VITAMINB12 Lab Results  Component Value Date   TSH 1.340 07/21/2023       ASSESSMENT AND PLAN  Right arm weakness - Plan: MR CERVICAL SPINE WO CONTRAST, NCV with EMG(electromyography)  Right arm pain - Plan: MR CERVICAL SPINE WO CONTRAST  Chronic tension-type headache, not intractable  Neck pain  Carpal tunnel syndrome of right wrist - Plan: NCV with EMG(electromyography)  Ulnar neuritis, unspecified laterality - Plan: NCV with EMG(electromyography)  Cervical radiculopathy - Plan: MR CERVICAL SPINE WO CONTRAST, NCV with EMG(electromyography)  In summary, Mr. Southgate is a 59 year old man who has had chronic headaches since October 2025.  The headaches have a mixed chronic tension and migraine character.  He is tender over the occipital nerves.  The  weakness and pain in the arm is likely multi factorial.  He appears to have an orthopedic issue involving the hand though the there is  likely either a superimposed C7 radiculopathy or median or ulnar neuropathy.  Bilateral splenius capitis muscles were injected with 6 mg Celestone  in 3 cc Marcaine using sterile technique.  He tolerated the injections well and pain was much better after a few minutes.  Start imipramine  25 mg nightly.  Hopefully this will help the headaches as well as the quality of his sleep.  It may also help the nocturia.  I also advised him to try to cut down on the Fioricet pills to just a couple a week or less. To evaluate the arm weakness/pain we will check NCV/EMG and cervical spine MRI.  Based on results may need surgical referral. RTC as needed based on results of studies or if new or worsening symptoms.    "

## 2024-02-13 ENCOUNTER — Ambulatory Visit: Payer: Self-pay | Admitting: Neurology

## 2024-02-13 ENCOUNTER — Encounter: Payer: Self-pay | Admitting: Neurology

## 2024-02-13 VITALS — BP 147/93 | HR 76 | Ht 69.0 in | Wt 216.5 lb

## 2024-02-13 DIAGNOSIS — M542 Cervicalgia: Secondary | ICD-10-CM | POA: Diagnosis not present

## 2024-02-13 DIAGNOSIS — G44229 Chronic tension-type headache, not intractable: Secondary | ICD-10-CM

## 2024-02-13 DIAGNOSIS — R29898 Other symptoms and signs involving the musculoskeletal system: Secondary | ICD-10-CM | POA: Diagnosis not present

## 2024-02-13 DIAGNOSIS — M5412 Radiculopathy, cervical region: Secondary | ICD-10-CM

## 2024-02-13 DIAGNOSIS — G5601 Carpal tunnel syndrome, right upper limb: Secondary | ICD-10-CM | POA: Diagnosis not present

## 2024-02-13 DIAGNOSIS — M79601 Pain in right arm: Secondary | ICD-10-CM | POA: Diagnosis not present

## 2024-02-13 DIAGNOSIS — G5620 Lesion of ulnar nerve, unspecified upper limb: Secondary | ICD-10-CM | POA: Diagnosis not present

## 2024-02-13 MED ORDER — BETAMETHASONE SOD PHOS & ACET 6 (3-3) MG/ML IJ SUSP
6.0000 mg | Freq: Once | INTRAMUSCULAR | Status: AC
  Administered 2024-02-13: 6 mg via INTRAMUSCULAR

## 2024-02-13 MED ORDER — IMIPRAMINE HCL 25 MG PO TABS: 25.0000 mg | ORAL_TABLET | Freq: Every day | ORAL | 11 refills | Status: AC

## 2024-02-15 ENCOUNTER — Ambulatory Visit

## 2024-02-15 ENCOUNTER — Ambulatory Visit: Admitting: Physician Assistant

## 2024-02-15 DIAGNOSIS — Z23 Encounter for immunization: Secondary | ICD-10-CM

## 2024-02-15 NOTE — Progress Notes (Incomplete)
 Pre-Chart Notes:  Diabetes: Labs/BG Assessment Plan Follow Up    02/15/2024 Name: Robert Franklin MRN: [id number] DOB: Apr 12, 1965  Subjective  No chief complaint on file.   Reason for visit: ?  [name] is a 59 y.o. male with a history of diabetes ({Blank single:19197::type 1,type 2,unclear type}), who presents today for {lzvisittype:31254} diabetes pharmacotherapy visit. They were referred by their PCP for management of ***.  Pertinent PMH also includes ***.  Known DM Complications: {lzdiabetescomplications:33838}   Care Team: Primary Care Provider: Ostwalt, Janna, PA-C   Date of Last Diabetes Related Visit: {LZ with PCP with pharmacist:31532}  Recent Summary of Change: ***  Medication Access/Adherence: Prescription drug coverage: Payor: BLUE CROSS BLUE SHIELD / Plan: BCBS OTHER / Product Type: *No Product type* / .  - Reports that all medications are *** affordable.  - Current Patient Assistance: None*** - Medication Adherence: Patient denies*** missing doses of their medication.    Since Last visit / History of Present Illness: ?  Patient reports implementing*** plan from last visit. Denies adverse effects with titration of ***.   Reported DM Regimen: ?  *** ***   DM medications tried in the past:?  *** ({ReasonforDiscontinuation:34588}) ***  ({ReasonforDiscontinuation:34588}) ***  ({ReasonforDiscontinuation:34588})  SMBG: *** ?  ***   Hypo/Hyperglycemia: ?  Symptoms of hypoglycemia since last visit:? {Blank multiple:19197::yes,no,n/a,***}  Symptoms of hyperglycemia since last visit:? {Blank multiple:19197::yes,no,n/a,***} - {symptoms; hyperglycemia:17903}  Reported Diet: Patient typically eats *** meals per day.  Breakfast: *** Lunch: *** Dinner: *** Snacks: *** Beverages: ***  Exercise: ***  DM Prevention:  Statin: {Blank single:19197::***,Taking,Not taking,Intolerant to,Declines}; {Blank  single:19197::low intensity,moderate intensity,high intensity,n/a}.?  History of chronic kidney disease? {Blank single:19197::yes,no} History of albuminuria? {Blank single:19197::yes,no}, last UACR on *** = *** mg/g Lab Results  Component Value Date   MICRALBCREAT 48 (H) 10/05/2023   MICRALBCREAT 19 02/14/2022   ACE/ARB - {Blank single:19197::***,Taking,Not taking,Intolerant to,Declines} ***; Urine MA/CR Ratio - {Blank single:19197::<30 mg/g,elevated,n/a,***}.  Last eye exam:   Last foot exam: No foot exam found Tobacco Use:  Tobacco Use: Medium Risk (02/13/2024)   Patient History    Smoking Tobacco Use: Former    Smokeless Tobacco Use: Never    Passive Exposure: Not on file   Immunizations:? Flu: {LZDUEUPTODATE:31033} (Last: ); Pneumococcal: {LZVAXpneumococcal:31032:::0} - {LZVAXpneumo RISKAge19-64:31093}; Shingrix: {LZDUEUPTODATE:31033} (Last: , ); Covid: {LZDUEUPTODATE:31033} (Last:); TDap: {LZDUEUPTODATE:31033} (Last: 08/11/2020)  Cardiovascular Risk Reduction History of clinical ASCVD? {Blank single:19197::yes,no} The 10-year ASCVD risk score (Arnett DK, et al., 2019) is: 29.8% History of heart failure? {Blank single:19197::yes,no} History of hyperlipidemia? {Blank single:19197::yes,no} Current BMI: *** kg/m2 (Ht *** in, Wt *** kg); Estimated body mass index is 31.97 kg/m as calculated from the following:   Height as of 02/13/24: 5' 9 (1.753 m).   Weight as of 02/13/24: 98.2 kg (216 lb 8 oz).  Taking statin? {Blank single:19197::yes,no,intolerant,declines}; {Blank single:19197::low intensity,moderate intensity,high intensity,n/a} (***) Taking aspirin? {Blank single:19197::indicated (primary prevention),indicated (secondary prevention),not indicated,unclear if indicated}; {Blank single:19197::***,Taking,Not taking,Intolerant to,Declines}   Taking SGLT-2i? {Blank single:19197::yes,no} Taking GLP- 1  RA? {Blank single:19197::yes,no}      _______________________________________________  Objective    Review of Systems:? Limited in the setting of virtual visit *** Constitutional:? No fever, chills or unintentional weight loss  Cardiovascular:? No chest pain or pressure, shortness of breath, dyspnea on exertion, orthopnea or LE edema  Pulmonary:? No cough or shortness of breath  GI:? No nausea, vomiting, constipation, diarrhea, abdominal pain, dyspepsia, change in bowel habits  Endocrine:? No polyuria, polyphagia or blurred vision   {  CGM reports:33570} 7 day average blood glucose: ***  Libre3 CGM Download today *** % Time CGM is active: ***% Average Glucose: *** mg/dL Glucose Management Indicator: ***  Glucose Variability: ***% (goal <36%) Time in Goal:  - Time in range 70-180: ***% - Time above range: ***% - Time below range: ***% Observed patterns:   Physical Examination:  Vitals:  Wt Readings from Last 3 Encounters:  02/13/24 98.2 kg (216 lb 8 oz)  02/09/24 96.8 kg (213 lb 6.4 oz)  11/22/23 98.9 kg (218 lb)   BP Readings from Last 3 Encounters:  02/13/24 (!) 147/93  02/09/24 (!) 147/99  11/22/23 122/84   Pulse Readings from Last 3 Encounters:  02/13/24 76  02/09/24 79  11/22/23 75     Labs:?  Lab Results  Component Value Date   HGBA1C 7.3 (H) 07/21/2023   HGBA1C 6.7 (H) 06/15/2022   HGBA1C 6.9 (H) 10/12/2021   GLUCOSE 120 (H) 11/16/2023   MICRALBCREAT 48 (H) 10/05/2023   MICRALBCREAT 19 02/14/2022   CREATININE 1.12 11/16/2023   CREATININE 1.06 10/05/2023   CREATININE 1.14 07/21/2023    Lab Results  Component Value Date   CHOL 227 (H) 10/05/2023   LDLCALC 129 (H) 10/05/2023   LDLCALC 122 (H) 07/21/2023   LDLCALC 97 06/12/2023   LDLDIRECT 46 07/05/2018   HDL 30 (L) 10/05/2023   TRIG 381 (H) 10/05/2023   TRIG 420 (H) 07/21/2023   TRIG 464 (H) 06/12/2023   ALT 28 10/05/2023   ALT 24 07/21/2023   AST 30 10/05/2023   AST 26 07/21/2023       Chemistry      Component Value Date/Time   NA 140 11/16/2023 1608   NA 135 (L) 01/29/2013 0111   K 4.5 11/16/2023 1608   K 4.1 01/29/2013 0111   CL 100 11/16/2023 1608   CL 103 01/29/2013 0111   CO2 24 11/16/2023 1608   CO2 28 01/29/2013 0111   BUN 24 11/16/2023 1608   BUN 25 (H) 01/29/2013 0111   CREATININE 1.12 11/16/2023 1608   CREATININE 1.01 01/29/2013 0111   GLU 80 10/07/2013 0000      Component Value Date/Time   CALCIUM  10.2 11/16/2023 1608   CALCIUM  9.2 01/29/2013 0111   ALKPHOS 88 10/05/2023 1556   AST 30 10/05/2023 1556   ALT 28 10/05/2023 1556   BILITOT 0.8 10/05/2023 1556       The 10-year ASCVD risk score (Arnett DK, et al., 2019) is: 29.8%  Assessment and Plan:   1. Diabetes, {Blank single:19197::type 1,type 2,LADA,MODY,post-transplant}: {CHL Controlled/[phone]} per last A1c of ***% (***), {Increased/decreased/na:15831} from previous, ***%. Goal <7% without hypoglycemia.  {lzcgmsmbg:31042} data shows ***?. Current Regimen: *** Diet: *** Exercise: ***  HCM: {LZ HCM DM2:31604} Continue medications today without changes.  ***  Reviewed s/sx/tx hypoglycemia Future Consideration: GLP1-RA: ***. Hx negative for pancreatitis, personal/fam hx medullary thyroid  carcinoma or MEN2.  DPP4i: *** SGLT2i: *** Metformin : {LZ AP metformin :31605}; eGFR cannot be calculated (Patient's most recent lab result is older than the maximum 21 days allowed.).  SU: Not unreasonable, though defer given alternative options with lower risk of hypoglycemia.  TZD: Avoiding due to possible weight gain/increase in fracture risk. ***CHF Insulin: Not unreasonable, though defer as last resort or as needed for severe hyperglycemia given risk hypoglycemia. Other safer options at this time.  Insulin: ***    2. HTN: {CHL Controlled/[phone]} based on last clinic BP of *** mmHg (***), goal <130/80 mmHg. Does not *** monitor BP  at home. Denies  lightheadedness, dizziness, SOB, CP, vision changes.  Current Regimen: *** Continue medications without changes.  Future Consideration: ACEi/ARB: *** CCB: *** Thiazide diuretic: *** MRA: *** Beta-Blocker: *** Hydralazine: *** BP Readings from Last 3 Encounters:  02/13/24 (!) 147/93  02/09/24 (!) 147/99  11/22/23 122/84     3. ASCVD (*** prevention): {CHL Controlled/Uncontrolled:4178619385} on last lipid panel with LDL *** mg/dL, TG *** mg/dL (***). LDL goal {ldl goal:31310}.  Key risk factors: {cvs risk:31001} The 10-year ASCVD risk score (Arnett DK, et al., 2019) is: 29.8% indicates patient is at *** risk.  PREVENT: 10-Yr CAD ***%, 30-Yr CAD ***% indicates patient is at *** risk.  Current Regimen: *** mg daily Continue medications today without changes.  Future Consideration: Statin: *** Ezetimibe : *** PCSK9i: ***  Lab Results  Component Value Date   LDLCALC 129 (H) 10/05/2023   LDLCALC 122 (H) 07/21/2023   LDLDIRECT 46 07/05/2018   TRIG 381 (H) 10/05/2023   TRIG 420 (H) 07/21/2023     4. Healthcare Maintenance:  Pneumococcal - Current status: ***  Shingles - Current status: *** Influenza - Current status: ***  Due to receive the following vaccines: {LZAPIMMUNIZATION:31028}   Follow Up Follow up with clinical pharmacist via *** in *** weeks Patient given direct line for questions regarding medication therapy  Future Appointments  Date Time Provider Department Center  02/15/2024  1:00 PM Dineen Jolynn RIGGERS [facility]  02/15/2024  1:30 PM [facility]PH[facility]ACIST [facility]  04/17/2024 10:30 AM Onita Duos, MD [facility] None  04/24/2024  3:35 PM Loistine Sober, NP [facility] None    Prentice DOROTHA Favors, PharmD PGY1 Health-System Pharmacy Administration and Leadership Resident [facility]

## 2024-02-16 ENCOUNTER — Ambulatory Visit: Admitting: Physician Assistant

## 2024-02-20 ENCOUNTER — Ambulatory Visit
Admission: RE | Admit: 2024-02-20 | Discharge: 2024-02-20 | Disposition: A | Discharge status: Home / Self Care | Source: Ambulatory Visit | Attending: Family Medicine | Admitting: Family Medicine

## 2024-02-20 VITALS — BP 150/96 | HR 77 | Temp 98.4°F | Resp 20

## 2024-02-20 DIAGNOSIS — J988 Other specified respiratory disorders: Secondary | ICD-10-CM | POA: Diagnosis not present

## 2024-02-20 DIAGNOSIS — B9789 Other viral agents as the cause of diseases classified elsewhere: Secondary | ICD-10-CM

## 2024-02-20 LAB — POC COVID19/FLU A&B COMBO
Covid Antigen, POC: NEGATIVE
Influenza A Antigen, POC: NEGATIVE
Influenza B Antigen, POC: NEGATIVE

## 2024-02-20 MED ORDER — PSEUDOEPHEDRINE HCL 30 MG PO TABS: 30.0000 mg | ORAL_TABLET | Freq: Three times a day (TID) | ORAL | 0 refills | Status: AC | PRN

## 2024-02-20 MED ORDER — ACETAMINOPHEN 325 MG PO TABS: 650.0000 mg | ORAL_TABLET | Freq: Four times a day (QID) | ORAL | 0 refills | Status: AC | PRN

## 2024-02-20 MED ORDER — CETIRIZINE HCL 10 MG PO TABS: 10.0000 mg | ORAL_TABLET | Freq: Every day | ORAL | 0 refills | Status: AC

## 2024-02-20 MED ORDER — PROMETHAZINE-DM 6.25-15 MG/5ML PO SYRP: 5.0000 mL | ORAL_SOLUTION | Freq: Three times a day (TID) | ORAL | 0 refills | Status: AC | PRN

## 2024-02-20 NOTE — Discharge Instructions (Addendum)
 We will manage this as a viral respiratory infection, viral illness like a common cold. For sore throat or cough try using a honey-based tea. Use 3 teaspoons of honey with juice squeezed from half lemon. Place shaved pieces of ginger into 1/2-1 cup of water and warm over stove top. Then mix the ingredients and repeat every 4 hours as needed. Please take Tylenol  500mg -650mg  once every 6 hours for fevers, aches and pains. Hydrate very well with at least 2 liters (64 ounces) of water. Eat light meals such as soups (chicken and noodles, chicken wild rice, vegetable).  Do not eat any foods that you are allergic to.  Start an antihistamine like Zyrtec  (10mg  daily) for postnasal drainage, sinus congestion.  You can take this together with pseudoephedrine  (Sudafed) at a dose of 60 mg 3 times a day or twice daily as needed for the same kind of congestion.

## 2024-02-20 NOTE — ED Provider Notes (Signed)
 " [facility], Television/film[facility] - [facility]  Note:  This document was prepared using Conservation officer, historic buildings and may include unintentional dictation errors.  MRN: [id number] DOB: 12-25-1965  Subjective:   Robert Franklin is a 59 y.o. male presenting for 4-day history of malaise, fatigue, sinus pressure, sinus congestion and pain, bilateral ear pain, productive cough.  No fever, chest pain, shortness of breath or wheezing.  No asthma. No smoking of any kind including cigarettes, cigars, vaping, marijuana use.  Has a history of heart disease, gastric ulcer.  He is a diabetic.  Current Outpatient Medications  Medication Instructions   Align 4 mg, Daily   Baclofen  5 mg, Oral, At bedtime PRN   BIOTIN PO 10,000 mcg, 2 times daily   bismuth subsalicylate (PEPTO BISMOL) 262 MG/15ML suspension 30 mLs, Every 6 hours PRN   butalbital -acetaminophen -caffeine  (FIORICET) 50-325-40 MG tablet 1 tablet, Oral, 2 times daily PRN   Charcoal Activated (ACTIVATED CHARCOAL PO) 260 mg, 2 times daily   CINNAMON PO 1,200 mg, Daily   Continuous Glucose Sensor (FREESTYLE LIBRE 3 PLUS SENSOR) MISC Change sensor every 15 days.   escitalopram  (LEXAPRO ) 10 mg, Oral, Daily   ezetimibe  (ZETIA ) 10 mg, Oral, Daily   Farxiga  10 mg, Oral, Daily before breakfast   furosemide  (LASIX ) 20 MG tablet TAKE 1 TABLET DAILY AS NEEDED (FOR WEIGHT GAIN OF 3 POUNDS OVERNIGHT OR 5 POUNDS IN A WEEK).   ibuprofen  (ADVIL ) 800 mg, Oral, Every 8 hours PRN   imipramine  (TOFRANIL ) 25 mg, Oral, Daily at bedtime   irbesartan  (AVAPRO ) 150 mg, Oral, Daily   magnesium  gluconate (MAGONATE) 500 mg, Daily   metoprolol  succinate (TOPROL -XL) 50 mg, Oral, Daily, TAKE WITH OR IMMEDIATELY FOLLOWING A MEAL.   omeprazole  (PRILOSEC  OTC) 20 mg, Oral, Daily   ondansetron  (ZOFRAN -ODT) 4 mg, Oral, Every 8 hours PRN   Potassium 99 mg, Daily   Rybelsus  3 mg, Oral, Daily   Saw Palmetto, Serenoa repens, (SAW PALMETTO PO) 1,400 mg, Daily   tadalafil   (CIALIS ) 20 mg, Oral, Every 3 DAYS PRN   tadalafil  (CIALIS ) 10 mg, Oral, Daily, Ok to take additional 10mg  1 hour prior to sexual activity   TURMERIC PO 2,600 mg, 2 times daily   Vascepa  2 g, Oral, 2 times daily    Allergies[1]  Past Medical History:  Diagnosis Date   Coronary artery disease, non-occlusive 02/28/2008   Dr. Fernand @ [facility]: 2 x 40-50% lesions in dominant LCx lesion by cath,  EF 60% with normal wall motion.;  Follow catheterization January 2021 showed stable findings.  LCx lesions likely not stenoses, more normal diameter vessel in setting of diffuse ectasia/aneurysm.   Diabetes mellitus without complication (HCC)    Essential hypertension    GERD (gastroesophageal reflux disease)    Headache    High triglycerides     was previously on treatment, but  he notes that this was stopped   Kidney stones    Moderate obesity 05/29/2014   OSA treated with BiPAP    Short bowel syndrome 1994     Past Surgical History:  Procedure Laterality Date   stomach wrap  1991   GERD   APPENDECTOMY     CARDIAC CATHETERIZATION  02/2008   Dr. Fernand @ [facility]: 2 x 40-50% lesions in a dominant LCx by cath,  EF 60% with normal wall motion.   Cardiolite Nuclear Stress Test  01/2008    heart rate increased from 80-158 BPM. No EKG changes.  there is a  moderate sized  reversible defect in the inferior and inferior lateral wall as well as anteroseptal. EF 59%.   CardioNet Holter Monitor  01/2008    sinus rhythm. Rare PVCs in setting was. Current PACs , seen in singles and pairs. Max heart rate 127, minimum heart rate 57. Average 84.   CHOLECYSTECTOMY     COLON SURGERY     due to gangrenous appendix   COLONOSCOPY N/A 02/09/2024   Procedure: COLONOSCOPY;  Surgeon: Therisa Bi, MD;  Location: [facility] ENDOSCOPY;  Service: Gastroenterology;  Laterality: N/A;   COLONOSCOPY WITH PROPOFOL  N/A 04/26/2016   Procedure: COLONOSCOPY WITH PROPOFOL ;  Surgeon: Bi Therisa, MD;  Location: [facility] ENDOSCOPY;  Service:  Endoscopy;  Laterality: N/A;   ESOPHAGOGASTRODUODENOSCOPY (EGD) WITH PROPOFOL  N/A 01/11/2019   Procedure: ESOPHAGOGASTRODUODENOSCOPY (EGD) WITH PROPOFOL ;  Surgeon: Therisa Bi, MD;  Location: [facility] ENDOSCOPY;  Service: Gastroenterology;  Laterality: N/A;   FLEXIBLE SIGMOIDOSCOPY N/A 01/11/2019   Procedure: FLEXIBLE SIGMOIDOSCOPY;  Surgeon: Therisa Bi, MD;  Location: [facility] ENDOSCOPY;  Service: Gastroenterology;  Laterality: N/A;   HERNIA REPAIR  2010   INNER EAR SURGERY     LEFT HEART CATH AND CORONARY ANGIOGRAPHY N/A 02/12/2019   Procedure: LEFT HEART CATH AND CORONARY ANGIOGRAPHY;  Surgeon: Anner Alm ORN, MD;  Location: [facility] INVASIVE CV LAB;  Service: Cardiovascular; Proximal RCA 40%.  Diffuse aneurysmal/ectatic (patulous) dominant LCx with no significant lesions.  Questionable 40 to 50% stenosis-most likely actually just normal diameter with surrounding ectasia/aneurysmal segments.  EF 55 to 60%.  Normal EDP.   NM MYOVIEW  LTD  01/10/2019   Treadmill Myoview : Walk 9:40 min-10.1 METS) EF ~45 to 50%.  INTERMEDIATE RISK STUDY-moderate sized mostly fixed perfusion defect in anterior apical segment.  No reversibility.  Suggests prior infarct without ischemia.   TONSILLECTOMY     TYMPANOSTOMY TUBE PLACEMENT Bilateral 1973    Family History  Problem Relation Age of Onset   Hypertension Mother    Irritable bowel syndrome Mother    Migraines Mother    Lumbar disc disease Mother    Heart attack Mother    Heart attack Maternal Uncle    Heart attack Maternal Grandfather     Social History   Occupational History   Occupation: [employer] social research officer  Tobacco Use   Smoking status: Former    Current packs/day: 0.00    Average packs/day: 1 pack/day for 28.0 years (28.0 ttl pk-yrs)    Types: Cigarettes    Start date: 12/30/1977    Quit date: 12/30/2005    Years since quitting: 18.1   Smokeless tobacco: Never  Vaping Use   Vaping status: Never Used  Substance and Sexual Activity   Alcohol use: Not  Currently   Drug use: Not Currently   Sexual activity: Not Currently     ROS   Objective:   Vitals: BP (!) 150/96 (BP Location: Right Arm)   Pulse 77   Temp 98.4 F (36.9 C) (Oral)   Resp 20   SpO2 95%   Physical Exam Constitutional:      General: He is not in acute distress.    Appearance: Normal appearance. He is well-developed and normal weight. He is not ill-appearing, toxic-appearing or diaphoretic.  HENT:     Head: Normocephalic and atraumatic.     Right Ear: Tympanic membrane, ear canal and external ear normal. No drainage, swelling or tenderness. No middle ear effusion. There is no impacted cerumen. Tympanic membrane is not erythematous or bulging.     Left Ear: Tympanic membrane,  ear canal and external ear normal. No drainage, swelling or tenderness.  No middle ear effusion. There is no impacted cerumen. Tympanic membrane is not erythematous or bulging.     Nose: Nose normal. No congestion or rhinorrhea.     Mouth/Throat:     Mouth: Mucous membranes are moist.     Pharynx: No pharyngeal swelling, oropharyngeal exudate, posterior oropharyngeal erythema or uvula swelling.     Tonsils: No tonsillar exudate or tonsillar abscesses. 0 on the right. 0 on the left.  Eyes:     General: No scleral icterus.       Right eye: No discharge.        Left eye: No discharge.     Extraocular Movements: Extraocular movements intact.     Conjunctiva/sclera: Conjunctivae normal.  Cardiovascular:     Rate and Rhythm: Normal rate and regular rhythm.     Heart sounds: Normal heart sounds. No murmur heard.    No friction rub. No gallop.  Pulmonary:     Effort: Pulmonary effort is normal. No respiratory distress.     Breath sounds: Normal breath sounds. No stridor. No wheezing, rhonchi or rales.  Musculoskeletal:     Cervical back: Normal range of motion and neck supple. No rigidity. No muscular tenderness.  Neurological:     General: No focal deficit present.     Mental Status: He is  alert and oriented to person, place, and time.  Psychiatric:        Mood and Affect: Mood normal.        Behavior: Behavior normal.        Thought Content: Thought content normal.        Judgment: Judgment normal.     Results for orders placed or performed during the hospital encounter of 02/20/24 (from the past 24 hours)  POC Covid19/Flu A&B Antigen     Status: Normal   Collection Time: 02/20/24  4:52 PM  Result Value Ref Range   Influenza A Antigen, POC Negative Negative   Influenza B Antigen, POC Negative Negative   Covid Antigen, POC Negative Negative    Assessment and Plan :   PDMP not reviewed this encounter.  1. Viral respiratory infection      Deferred imaging given clear pulmonary exam.  Suspect viral URI, viral syndrome. Physical exam findings reassuring and vital signs stable for discharge. Advised supportive care, offered symptomatic relief. Counseled patient on potential for adverse effects with medications prescribed/recommended today, ER and return-to-clinic precautions discussed, patient verbalized understanding.       [1] No Known Allergies    Christopher Savannah, [place] 02/20/24 1653  "

## 2024-02-20 NOTE — ED Triage Notes (Signed)
 Pt prod cough, sinus pain/pressure x 4 days-taking multiple OTC meds-NAD-steady gait

## 2024-02-21 ENCOUNTER — Ambulatory Visit

## 2024-02-21 DIAGNOSIS — R29898 Other symptoms and signs involving the musculoskeletal system: Secondary | ICD-10-CM | POA: Diagnosis not present

## 2024-02-21 DIAGNOSIS — M79601 Pain in right arm: Secondary | ICD-10-CM | POA: Diagnosis not present

## 2024-02-21 DIAGNOSIS — M5412 Radiculopathy, cervical region: Secondary | ICD-10-CM | POA: Diagnosis not present

## 2024-02-26 ENCOUNTER — Ambulatory Visit: Payer: Self-pay | Admitting: Neurology

## 2024-02-28 ENCOUNTER — Other Ambulatory Visit: Payer: Self-pay | Admitting: Cardiology

## 2024-04-17 ENCOUNTER — Encounter: Admitting: Neurology

## 2024-04-24 ENCOUNTER — Ambulatory Visit: Payer: Self-pay | Admitting: Student
